# Patient Record
Sex: Female | Born: 1945 | Race: White | Hispanic: No | Smoking: Former smoker
Health system: Southern US, Community
[De-identification: ages and names within clinical notes are randomized; demographics above are authoritative.]

## PROBLEM LIST (undated history)

## (undated) DIAGNOSIS — I493 Ventricular premature depolarization: Secondary | ICD-10-CM

## (undated) DIAGNOSIS — E039 Hypothyroidism, unspecified: Secondary | ICD-10-CM

## (undated) DIAGNOSIS — E7800 Pure hypercholesterolemia, unspecified: Secondary | ICD-10-CM

## (undated) DIAGNOSIS — J449 Chronic obstructive pulmonary disease, unspecified: Secondary | ICD-10-CM

## (undated) DIAGNOSIS — G4730 Sleep apnea, unspecified: Secondary | ICD-10-CM

## (undated) DIAGNOSIS — I2510 Atherosclerotic heart disease of native coronary artery without angina pectoris: Secondary | ICD-10-CM

## (undated) DIAGNOSIS — C4490 Unspecified malignant neoplasm of skin, unspecified: Secondary | ICD-10-CM

## (undated) DIAGNOSIS — T4145XA Adverse effect of unspecified anesthetic, initial encounter: Secondary | ICD-10-CM

## (undated) DIAGNOSIS — C50919 Malignant neoplasm of unspecified site of unspecified female breast: Secondary | ICD-10-CM

## (undated) DIAGNOSIS — G500 Trigeminal neuralgia: Secondary | ICD-10-CM

## (undated) DIAGNOSIS — K2270 Barrett's esophagus without dysplasia: Secondary | ICD-10-CM

## (undated) DIAGNOSIS — F329 Major depressive disorder, single episode, unspecified: Secondary | ICD-10-CM

## (undated) DIAGNOSIS — I10 Essential (primary) hypertension: Secondary | ICD-10-CM

## (undated) DIAGNOSIS — J45909 Unspecified asthma, uncomplicated: Secondary | ICD-10-CM

## (undated) DIAGNOSIS — D509 Iron deficiency anemia, unspecified: Secondary | ICD-10-CM

## (undated) DIAGNOSIS — K219 Gastro-esophageal reflux disease without esophagitis: Secondary | ICD-10-CM

## (undated) DIAGNOSIS — K635 Polyp of colon: Secondary | ICD-10-CM

## (undated) DIAGNOSIS — T8859XA Other complications of anesthesia, initial encounter: Secondary | ICD-10-CM

## (undated) DIAGNOSIS — J189 Pneumonia, unspecified organism: Secondary | ICD-10-CM

## (undated) DIAGNOSIS — F32A Depression, unspecified: Secondary | ICD-10-CM

## (undated) DIAGNOSIS — Z87891 Personal history of nicotine dependence: Secondary | ICD-10-CM

## (undated) DIAGNOSIS — F419 Anxiety disorder, unspecified: Secondary | ICD-10-CM

## (undated) HISTORY — DX: Pure hypercholesterolemia, unspecified: E78.00

## (undated) HISTORY — DX: Malignant neoplasm of unspecified site of unspecified female breast: C50.919

## (undated) HISTORY — PX: MASTECTOMY: SHX3

## (undated) HISTORY — DX: Major depressive disorder, single episode, unspecified: F32.9

## (undated) HISTORY — PX: COLONOSCOPY W/ POLYPECTOMY: SHX1380

## (undated) HISTORY — DX: Depression, unspecified: F32.A

## (undated) HISTORY — DX: Hypothyroidism, unspecified: E03.9

## (undated) HISTORY — PX: OTHER SURGICAL HISTORY: SHX169

## (undated) HISTORY — DX: Essential (primary) hypertension: I10

## (undated) HISTORY — DX: Personal history of nicotine dependence: Z87.891

## (undated) HISTORY — DX: Atherosclerotic heart disease of native coronary artery without angina pectoris: I25.10

## (undated) HISTORY — DX: Sleep apnea, unspecified: G47.30

---

## 1998-03-24 ENCOUNTER — Other Ambulatory Visit: Admission: RE | Admit: 1998-03-24 | Discharge: 1998-03-24 | Payer: Self-pay | Admitting: Obstetrics and Gynecology

## 1998-04-23 ENCOUNTER — Other Ambulatory Visit: Admission: RE | Admit: 1998-04-23 | Discharge: 1998-04-23 | Payer: Self-pay | Admitting: Obstetrics and Gynecology

## 1998-05-28 ENCOUNTER — Other Ambulatory Visit: Admission: RE | Admit: 1998-05-28 | Discharge: 1998-05-28 | Payer: Self-pay | Admitting: Obstetrics and Gynecology

## 1999-10-13 ENCOUNTER — Other Ambulatory Visit: Admission: RE | Admit: 1999-10-13 | Discharge: 1999-10-13 | Payer: Self-pay | Admitting: Family Medicine

## 2001-02-20 ENCOUNTER — Other Ambulatory Visit: Admission: RE | Admit: 2001-02-20 | Discharge: 2001-02-20 | Payer: Self-pay | Admitting: Obstetrics and Gynecology

## 2004-09-05 ENCOUNTER — Emergency Department: Payer: Self-pay | Admitting: Emergency Medicine

## 2004-09-20 ENCOUNTER — Ambulatory Visit: Payer: Self-pay | Admitting: Internal Medicine

## 2004-09-23 ENCOUNTER — Encounter: Payer: Self-pay | Admitting: Internal Medicine

## 2004-09-30 ENCOUNTER — Ambulatory Visit: Payer: Self-pay | Admitting: Internal Medicine

## 2005-05-27 ENCOUNTER — Ambulatory Visit: Payer: Self-pay | Admitting: Internal Medicine

## 2005-06-03 ENCOUNTER — Ambulatory Visit: Payer: Self-pay | Admitting: Internal Medicine

## 2005-10-19 ENCOUNTER — Ambulatory Visit: Payer: Self-pay | Admitting: Unknown Physician Specialty

## 2006-07-18 LAB — HM MAMMOGRAPHY

## 2006-08-08 ENCOUNTER — Ambulatory Visit: Payer: Self-pay | Admitting: Internal Medicine

## 2006-08-21 ENCOUNTER — Ambulatory Visit: Payer: Self-pay | Admitting: Specialist

## 2006-08-22 ENCOUNTER — Ambulatory Visit: Payer: Self-pay | Admitting: Family Medicine

## 2006-10-03 ENCOUNTER — Ambulatory Visit: Payer: Self-pay | Admitting: Specialist

## 2007-01-01 ENCOUNTER — Ambulatory Visit: Payer: Self-pay | Admitting: Specialist

## 2007-02-20 ENCOUNTER — Ambulatory Visit: Payer: Self-pay | Admitting: Family Medicine

## 2007-05-10 ENCOUNTER — Ambulatory Visit: Payer: Self-pay | Admitting: Internal Medicine

## 2007-05-29 ENCOUNTER — Ambulatory Visit: Payer: Self-pay | Admitting: Internal Medicine

## 2007-06-09 ENCOUNTER — Ambulatory Visit: Payer: Self-pay | Admitting: Internal Medicine

## 2007-10-18 ENCOUNTER — Ambulatory Visit: Payer: Self-pay | Admitting: Internal Medicine

## 2008-02-27 ENCOUNTER — Encounter: Payer: Self-pay | Admitting: Internal Medicine

## 2008-03-11 ENCOUNTER — Encounter: Payer: Self-pay | Admitting: Internal Medicine

## 2008-04-10 ENCOUNTER — Ambulatory Visit: Payer: Self-pay | Admitting: Internal Medicine

## 2008-04-10 ENCOUNTER — Encounter: Payer: Self-pay | Admitting: Internal Medicine

## 2008-10-06 ENCOUNTER — Ambulatory Visit: Payer: Self-pay | Admitting: Internal Medicine

## 2008-11-27 ENCOUNTER — Ambulatory Visit: Payer: Self-pay | Admitting: Internal Medicine

## 2008-12-10 ENCOUNTER — Ambulatory Visit: Payer: Self-pay | Admitting: Internal Medicine

## 2008-12-31 ENCOUNTER — Ambulatory Visit: Payer: Self-pay | Admitting: Surgery

## 2008-12-31 ENCOUNTER — Ambulatory Visit: Payer: Self-pay | Admitting: Internal Medicine

## 2009-01-06 ENCOUNTER — Ambulatory Visit: Payer: Self-pay | Admitting: Surgery

## 2009-01-08 ENCOUNTER — Ambulatory Visit: Payer: Self-pay | Admitting: Oncology

## 2009-01-08 DIAGNOSIS — C50919 Malignant neoplasm of unspecified site of unspecified female breast: Secondary | ICD-10-CM

## 2009-01-08 HISTORY — DX: Malignant neoplasm of unspecified site of unspecified female breast: C50.919

## 2009-01-16 ENCOUNTER — Ambulatory Visit: Payer: Self-pay | Admitting: Oncology

## 2009-02-06 ENCOUNTER — Other Ambulatory Visit: Payer: Self-pay | Admitting: Internal Medicine

## 2009-02-08 ENCOUNTER — Ambulatory Visit: Payer: Self-pay | Admitting: Oncology

## 2009-02-09 ENCOUNTER — Inpatient Hospital Stay: Payer: Self-pay | Admitting: Surgery

## 2009-02-25 ENCOUNTER — Ambulatory Visit: Payer: Self-pay | Admitting: Oncology

## 2009-03-11 ENCOUNTER — Ambulatory Visit: Payer: Self-pay | Admitting: Oncology

## 2009-04-10 ENCOUNTER — Ambulatory Visit: Payer: Self-pay | Admitting: Oncology

## 2009-04-24 ENCOUNTER — Encounter: Payer: Self-pay | Admitting: Internal Medicine

## 2009-05-11 ENCOUNTER — Encounter: Payer: Self-pay | Admitting: Internal Medicine

## 2009-05-11 HISTORY — PX: MASTECTOMY: SHX3

## 2009-06-11 ENCOUNTER — Ambulatory Visit: Payer: Self-pay | Admitting: Surgery

## 2009-06-18 ENCOUNTER — Inpatient Hospital Stay: Payer: Self-pay | Admitting: Surgery

## 2009-07-11 ENCOUNTER — Ambulatory Visit: Payer: Self-pay | Admitting: Oncology

## 2009-07-11 HISTORY — PX: BREAST SURGERY: SHX581

## 2009-07-31 ENCOUNTER — Ambulatory Visit: Payer: Self-pay | Admitting: Oncology

## 2009-08-11 ENCOUNTER — Ambulatory Visit: Payer: Self-pay | Admitting: Oncology

## 2009-09-24 ENCOUNTER — Ambulatory Visit: Payer: Self-pay

## 2009-11-08 ENCOUNTER — Ambulatory Visit: Payer: Self-pay | Admitting: Oncology

## 2009-11-12 ENCOUNTER — Ambulatory Visit: Payer: Self-pay | Admitting: Oncology

## 2009-12-09 ENCOUNTER — Ambulatory Visit: Payer: Self-pay | Admitting: Oncology

## 2010-02-08 ENCOUNTER — Ambulatory Visit: Payer: Self-pay | Admitting: Oncology

## 2010-02-25 ENCOUNTER — Ambulatory Visit: Payer: Self-pay | Admitting: Oncology

## 2010-03-11 ENCOUNTER — Ambulatory Visit: Payer: Self-pay | Admitting: Oncology

## 2010-05-18 ENCOUNTER — Ambulatory Visit: Payer: Self-pay | Admitting: Oncology

## 2010-05-21 ENCOUNTER — Ambulatory Visit: Payer: Self-pay | Admitting: Oncology

## 2010-05-22 LAB — CANCER ANTIGEN 27.29: CA 27.29: 18.1 U/mL (ref 0.0–38.6)

## 2010-06-10 ENCOUNTER — Ambulatory Visit: Payer: Self-pay | Admitting: Oncology

## 2010-11-19 ENCOUNTER — Ambulatory Visit: Payer: Self-pay | Admitting: Oncology

## 2010-11-20 LAB — CANCER ANTIGEN 27.29: CA 27.29: 13.8 U/mL (ref 0.0–38.6)

## 2010-12-10 ENCOUNTER — Ambulatory Visit: Payer: Self-pay | Admitting: Oncology

## 2010-12-31 ENCOUNTER — Ambulatory Visit: Payer: Self-pay | Admitting: Internal Medicine

## 2011-02-15 ENCOUNTER — Encounter: Payer: Self-pay | Admitting: Internal Medicine

## 2011-03-02 ENCOUNTER — Other Ambulatory Visit: Payer: Self-pay | Admitting: Internal Medicine

## 2011-03-02 MED ORDER — VENLAFAXINE HCL ER 150 MG PO CP24: 150.0000 mg | ORAL_CAPSULE | Freq: Every day | ORAL | Status: DC

## 2011-07-14 ENCOUNTER — Telehealth: Payer: Self-pay | Admitting: Internal Medicine

## 2011-07-14 NOTE — Telephone Encounter (Signed)
Refill on her Lisinopril 40 mg . Called into [facility].

## 2011-07-14 NOTE — Telephone Encounter (Signed)
Ok to do,  Patient's mother is in hospital and I spoike with her

## 2011-07-15 ENCOUNTER — Ambulatory Visit: Payer: Self-pay | Admitting: Oncology

## 2011-07-15 LAB — CBC CANCER CENTER
Basophil #: 0.1 "x10 3/mm "
Basophil %: 0.8 %
Eosinophil #: 0.2 "x10 3/mm "
Eosinophil %: 2.7 %
HCT: 37.5 %
HGB: 13.1 g/dL
Lymphocyte %: 43.4 %
Lymphs Abs: 2.8 "x10 3/mm "
MCH: 31.6 pg
MCHC: 35.1 g/dL
MCV: 90 fL
Monocyte #: 0.5 "x10 3/mm "
Monocyte %: 7.6 %
Neutrophil #: 3 "x10 3/mm "
Neutrophil %: 45.5 %
Platelet: 232 "x10 3/mm "
RBC: 4.16 "x10 6/mm "
RDW: 13.2 %
WBC: 6.6 "x10 3/mm "

## 2011-07-15 LAB — COMPREHENSIVE METABOLIC PANEL
Albumin: 3.7 g/dL (ref 3.4–5.0)
BUN: 19 mg/dL — ABNORMAL HIGH (ref 7–18)
Bilirubin,Total: 0.2 mg/dL (ref 0.2–1.0)
Chloride: 104 mmol/L (ref 98–107)
Co2: 29 mmol/L (ref 21–32)
EGFR (African American): 56 — ABNORMAL LOW
EGFR (Non-African Amer.): 46 — ABNORMAL LOW
SGOT(AST): 23 U/L (ref 15–37)
SGPT (ALT): 44 U/L
Sodium: 140 mmol/L (ref 136–145)

## 2011-07-21 ENCOUNTER — Telehealth: Payer: Self-pay | Admitting: *Deleted

## 2011-07-21 NOTE — Telephone Encounter (Signed)
Pt's daughter states pt has high fever, cough, body aches, weakness- started suddenly last night.  She has been visiting her mother in the hospital and exposed to a lot of possible flu.  She is unable to come in because she feels so bad.  She is asking that something be called to [facility].

## 2011-07-22 MED ORDER — LEVOFLOXACIN 500 MG PO TABS: 500.0000 mg | ORAL_TABLET | Freq: Every day | ORAL | Status: AC

## 2011-07-22 MED ORDER — HYDROCODONE-ACETAMINOPHEN 5-500 MG PO TABS: ORAL_TABLET | ORAL | Status: DC

## 2011-07-22 NOTE — Telephone Encounter (Signed)
Please see note below, I thought I sent it to you yesterday, but perhaps I didn't.  I called pt's daughter and pt is still the same as yesterday.  She thinks she has some fever but is taking tylenol and sweating.  Still doesn't feel like coming in.  Please advise.

## 2011-07-22 NOTE — Telephone Encounter (Signed)
Per Dr. Darrick Huntsman, pt given the choice of tamiflu, which is expensive and may not help her, or antibiotic and cough medicine.  Daughter prefers that pt have the antibiotic and cough medicine.  meds called to [facility].

## 2011-07-26 ENCOUNTER — Encounter: Payer: Self-pay | Admitting: Internal Medicine

## 2011-07-26 ENCOUNTER — Ambulatory Visit (INDEPENDENT_AMBULATORY_CARE_PROVIDER_SITE_OTHER): Payer: Medicare Other | Admitting: Internal Medicine

## 2011-07-26 VITALS — BP 128/62 | HR 80 | Temp 98.0°F | Wt 188.0 lb

## 2011-07-26 DIAGNOSIS — R6889 Other general symptoms and signs: Secondary | ICD-10-CM

## 2011-07-26 DIAGNOSIS — F17200 Nicotine dependence, unspecified, uncomplicated: Secondary | ICD-10-CM

## 2011-07-26 DIAGNOSIS — R5381 Other malaise: Secondary | ICD-10-CM

## 2011-07-26 DIAGNOSIS — F3289 Other specified depressive episodes: Secondary | ICD-10-CM

## 2011-07-26 DIAGNOSIS — F329 Major depressive disorder, single episode, unspecified: Secondary | ICD-10-CM

## 2011-07-26 DIAGNOSIS — Z7189 Other specified counseling: Secondary | ICD-10-CM

## 2011-07-26 DIAGNOSIS — F32A Depression, unspecified: Secondary | ICD-10-CM

## 2011-07-26 DIAGNOSIS — Z716 Tobacco abuse counseling: Secondary | ICD-10-CM

## 2011-07-26 DIAGNOSIS — C50919 Malignant neoplasm of unspecified site of unspecified female breast: Secondary | ICD-10-CM

## 2011-07-26 DIAGNOSIS — R5383 Other fatigue: Secondary | ICD-10-CM

## 2011-07-26 DIAGNOSIS — Z720 Tobacco use: Secondary | ICD-10-CM

## 2011-07-26 DIAGNOSIS — J441 Chronic obstructive pulmonary disease with (acute) exacerbation: Secondary | ICD-10-CM

## 2011-07-26 MED ORDER — ESZOPICLONE 3 MG PO TABS: 3.0000 mg | ORAL_TABLET | Freq: Every day | ORAL | Status: DC

## 2011-07-26 MED ORDER — VENLAFAXINE HCL ER 150 MG PO CP24: 150.0000 mg | ORAL_CAPSULE | Freq: Every day | ORAL | Status: DC

## 2011-07-26 MED ORDER — ALBUTEROL SULFATE (2.5 MG/3ML) 0.083% IN NEBU
2.5000 mg | INHALATION_SOLUTION | Freq: Four times a day (QID) | RESPIRATORY_TRACT | Status: DC | PRN
  Administered 2011-07-26: 2.5 mg via RESPIRATORY_TRACT

## 2011-07-26 MED ORDER — LISINOPRIL 40 MG PO TABS: 40.0000 mg | ORAL_TABLET | Freq: Every day | ORAL | Status: DC

## 2011-07-26 MED ORDER — LEVOTHYROXINE SODIUM 75 MCG PO TABS: 75.0000 ug | ORAL_TABLET | Freq: Every day | ORAL | Status: DC

## 2011-07-26 MED ORDER — OMEPRAZOLE 40 MG PO CPDR: 40.0000 mg | DELAYED_RELEASE_CAPSULE | Freq: Every day | ORAL | Status: DC

## 2011-07-26 MED ORDER — METHYLPREDNISOLONE SODIUM SUCC 40 MG IJ SOLR
40.0000 mg | Freq: Once | INTRAMUSCULAR | Status: AC
  Administered 2011-07-26: 40 mg via INTRAMUSCULAR

## 2011-07-26 NOTE — Progress Notes (Signed)
Subjective:    Patient ID: Crystal Meyers, female    DOB: 07/02/1946, 66 y.o.   MRN: [id number]  HPI  66 yo white female with history of hypertension, On going tobacco abuse and breast ca, presents with URI symptoms and generalized weakness .  She has been taking antibiotics for 3 dayswithout significant change of symptoms  consistent with flu.  Was offered empiric Tamiflu but deferred secondary to cost. Reports vers,  Cough,  Malaise, myalgias and anorexia. [name]  No prior influenza vaccine. This is the sickest she has ever felt . Feels dizzy when she stands up, Trying to stay hydrated.  Sleep is disrupted by cough.  She has been taking care of elderly demented  Mother Scarlette Calico)  Who was  Discharged home with IV antibiotics x 3 for suspected brain abscess which may have started with a left parotid abscess. Has not been wearing a mask around her mother. One other family member became ill after helping out.     Past Medical History  Diagnosis Date  . Depression   . Hypertension   . Hypercholesteremia   . Breast cancer 7/10    left , invasive lobular carcinoma  . Sleep apnea   . Hypothyroidism    Current Outpatient Prescriptions on File Prior to Visit  Medication Sig Dispense Refill  . HYDROcodone-acetaminophen (VICODIN) 5-500 MG per tablet Take one by mouth every 6 hours as needed for severe cough.  30 tablet  0  . levofloxacin (LEVAQUIN) 500 MG tablet Take 1 tablet (500 mg total) by mouth daily.  7 tablet  0   No current facility-administered medications on file prior to visit.     Review of Systems  Constitutional: Positive for chills and fatigue. Negative for fever and unexpected weight change.  HENT: Positive for congestion and sinus pressure. Negative for hearing loss, ear pain, nosebleeds, sore throat, facial swelling, rhinorrhea, sneezing, mouth sores, trouble swallowing, neck pain, neck stiffness, voice change, postnasal drip, tinnitus and ear discharge.   Eyes: Negative for pain,  discharge, redness and visual disturbance.  Respiratory: Positive for cough. Negative for chest tightness, shortness of breath, wheezing and stridor.   Cardiovascular: Negative for chest pain, palpitations and leg swelling.  Gastrointestinal: Positive for nausea and diarrhea.  Musculoskeletal: Negative for myalgias and arthralgias.  Skin: Negative for color change and rash.  Neurological: Negative for dizziness, weakness, light-headedness and headaches.  Hematological: Negative for adenopathy.   BP 128/62  Pulse 80  Temp(Src) 98 F (36.7 C) (Oral)  Wt 188 lb (85.276 kg)  SpO2 98%     Objective:   Physical Exam  Constitutional: She is oriented to person, place, and time. She appears well-developed and well-nourished.       Sickly but not toxic appearing  HENT:  Mouth/Throat: Oropharynx is clear and moist.  Eyes: EOM are normal. Pupils are equal, round, and reactive to light. No scleral icterus.  Neck: Normal range of motion. Neck supple. No JVD present. No thyromegaly present.  Cardiovascular: Normal rate, regular rhythm, normal heart sounds and intact distal pulses.   Pulmonary/Chest: Effort normal. She has wheezes.  Abdominal: Soft. Bowel sounds are normal. She exhibits no mass. There is no tenderness.  Musculoskeletal: Normal range of motion. She exhibits no edema.  Lymphadenopathy:    She has no cervical adenopathy.  Neurological: She is alert and oriented to person, place, and time.  Skin: Skin is warm. She is diaphoretic.  Psychiatric: She has a normal mood and affect.  Assessment & Plan:

## 2011-07-27 ENCOUNTER — Encounter: Payer: Self-pay | Admitting: Internal Medicine

## 2011-07-27 ENCOUNTER — Telehealth: Payer: Self-pay | Admitting: *Deleted

## 2011-07-27 DIAGNOSIS — J439 Emphysema, unspecified: Secondary | ICD-10-CM | POA: Insufficient documentation

## 2011-07-27 DIAGNOSIS — Z720 Tobacco use: Secondary | ICD-10-CM

## 2011-07-27 DIAGNOSIS — C50919 Malignant neoplasm of unspecified site of unspecified female breast: Secondary | ICD-10-CM | POA: Insufficient documentation

## 2011-07-27 DIAGNOSIS — F329 Major depressive disorder, single episode, unspecified: Secondary | ICD-10-CM | POA: Insufficient documentation

## 2011-07-27 DIAGNOSIS — Z87891 Personal history of nicotine dependence: Secondary | ICD-10-CM | POA: Insufficient documentation

## 2011-07-27 DIAGNOSIS — Z716 Tobacco abuse counseling: Secondary | ICD-10-CM | POA: Insufficient documentation

## 2011-07-27 DIAGNOSIS — F32A Depression, unspecified: Secondary | ICD-10-CM | POA: Insufficient documentation

## 2011-07-27 HISTORY — DX: Tobacco use: Z72.0

## 2011-07-27 LAB — CBC WITH DIFFERENTIAL/PLATELET
Basophils Absolute: 0 10*3/uL (ref 0.0–0.1)
Basophils Relative: 0.3 % (ref 0.0–3.0)
Eosinophils Absolute: 0.1 10*3/uL (ref 0.0–0.7)
MCHC: 34.8 g/dL (ref 30.0–36.0)
MCV: 90.4 fl (ref 78.0–100.0)
Monocytes Absolute: 0.3 10*3/uL (ref 0.1–1.0)
Neutro Abs: 1.2 10*3/uL — ABNORMAL LOW (ref 1.4–7.7)
Neutrophils Relative %: 32.6 % — ABNORMAL LOW (ref 43.0–77.0)
RBC: 4.1 Mil/uL (ref 3.87–5.11)
RDW: 13 % (ref 11.5–14.6)

## 2011-07-27 LAB — COMPREHENSIVE METABOLIC PANEL
ALT: 51 U/L — ABNORMAL HIGH (ref 0–35)
AST: 50 U/L — ABNORMAL HIGH (ref 0–37)
Albumin: 3.9 g/dL (ref 3.5–5.2)
Alkaline Phosphatase: 55 U/L (ref 39–117)
BUN: 16 mg/dL (ref 6–23)
Chloride: 104 mEq/L (ref 96–112)
Potassium: 4.4 mEq/L (ref 3.5–5.1)
Sodium: 139 mEq/L (ref 135–145)

## 2011-07-27 MED ORDER — PREDNISONE (PAK) 10 MG PO TABS: ORAL_TABLET | ORAL | Status: AC

## 2011-07-27 MED ORDER — ALBUTEROL SULFATE HFA 108 (90 BASE) MCG/ACT IN AERS: 2.0000 | INHALATION_SPRAY | Freq: Four times a day (QID) | RESPIRATORY_TRACT | Status: DC | PRN

## 2011-07-27 NOTE — Telephone Encounter (Signed)
Pt called stating that albuterol inhaler was supposed to be called into pharm. I do not see on med list, please send into [facility]

## 2011-07-27 NOTE — Assessment & Plan Note (Signed)
Continue femara, regular follow up with Dr. Katrinka Blazing

## 2011-07-27 NOTE — Telephone Encounter (Signed)
Daughter states meds pt was prescribed yesterday were not sent to pharmacy.  All meds from yesterday called to [facility].

## 2011-07-27 NOTE — Assessment & Plan Note (Signed)
Continue effexor  

## 2011-07-27 NOTE — Assessment & Plan Note (Addendum)
Her rapid influenza test is negative and she is out of the window for effective treatment .  She was given a neb treatment  and 40 mg depomedrol in house, Continue antibiotics, steroids,  Cough suppressants and hydration  Smoking cessation strongly advised.

## 2011-07-27 NOTE — Telephone Encounter (Signed)
Done,

## 2011-07-27 NOTE — Telephone Encounter (Signed)
Pt's albuterol wasn't sent to [facility] during her visit yesterday, so she called after hours and Dr. Alwyn Ren ok'd call a nurse to send script to [facility] [address].

## 2011-08-12 ENCOUNTER — Ambulatory Visit: Payer: Self-pay | Admitting: Oncology

## 2011-08-28 LAB — HM COLONOSCOPY

## 2011-09-05 ENCOUNTER — Ambulatory Visit: Payer: Self-pay | Admitting: Unknown Physician Specialty

## 2011-09-06 LAB — PATHOLOGY REPORT

## 2011-09-09 ENCOUNTER — Ambulatory Visit: Payer: Self-pay | Admitting: Oncology

## 2011-09-20 ENCOUNTER — Encounter: Payer: Self-pay | Admitting: Internal Medicine

## 2011-10-10 ENCOUNTER — Ambulatory Visit (INDEPENDENT_AMBULATORY_CARE_PROVIDER_SITE_OTHER): Payer: Medicare Other | Admitting: Internal Medicine

## 2011-10-10 ENCOUNTER — Encounter: Payer: Self-pay | Admitting: Internal Medicine

## 2011-10-10 VITALS — BP 114/62 | HR 100 | Temp 98.2°F | Resp 16 | Wt 190.5 lb

## 2011-10-10 DIAGNOSIS — F32A Depression, unspecified: Secondary | ICD-10-CM

## 2011-10-10 DIAGNOSIS — G4733 Obstructive sleep apnea (adult) (pediatric): Secondary | ICD-10-CM

## 2011-10-10 DIAGNOSIS — F329 Major depressive disorder, single episode, unspecified: Secondary | ICD-10-CM

## 2011-10-10 DIAGNOSIS — C50919 Malignant neoplasm of unspecified site of unspecified female breast: Secondary | ICD-10-CM

## 2011-10-10 NOTE — Progress Notes (Signed)
Patient ID: Crystal Meyers, female   DOB: 27-Jan-1946, 66 y.o.   MRN: [id number]   Patient Active Problem List  Diagnoses  . COPD with acute exacerbation  . Tobacco abuse  . Breast cancer  . Tobacco abuse counseling  . Depression    Subjective:  CC:   Chief Complaint  Patient presents with  . Follow-up    HPI:   Crystal Meyers a 66 y.o. female who presents to discuss the condition of her mother, Crystal Meyers, who was hospitalized with severe hypernatremia and acute renal failure, RML pneumonia and cachexia.  She has been caring for her 24/7 for the last 4 years as her dementia has progressed and over the last 6 months has witnessed a progressive decline in function, including swallow function.  She has refused the dysphagia diet .   Past Medical History  Diagnosis Date  . Depression   . Hypertension   . Hypercholesteremia   . Breast cancer 7/10    left , invasive lobular carcinoma  . Sleep apnea   . Hypothyroidism     Past Surgical History  Procedure Date  . Mastectomy 05/2009    bilateral, reconstructive surgery 09/2009  . Mastectomy     Bilateral          The following portions of the patient's history were reviewed and updated as appropriate: Allergies, current medications, and problem list.    Review of Systems:   12 Pt  review of systems was negative except those addressed in the HPI,     History   Social History  . Marital Status: Widowed    Spouse Name: N/A    Number of Children: N/A  . Years of Education: N/A   Occupational History  . Not on file.   Social History Main Topics  . Smoking status: Current Everyday Smoker  . Smokeless tobacco: Never Used  . Alcohol Use: No  . Drug Use: No  . Sexually Active: Not on file   Other Topics Concern  . Not on file   Social History Narrative   Lives with mother.Recently widowed.Always uses seat beltsHas a bird and a dog.No exercise.    Objective:  BP 114/62  Pulse 100  Temp(Src) 98.2 F  (36.8 C) (Oral)  Resp 16  Wt 190 lb 8 oz (86.41 kg)  SpO2 96%  General appearance: alert, cooperative and appears stated age Ears: normal TM's and external ear canals both ears Throat: lips, mucosa, and tongue normal; teeth and gums normal Neck: no adenopathy, no carotid bruit, supple, symmetrical, trachea midline and thyroid not enlarged, symmetric, no tenderness/mass/nodules Back: symmetric, no curvature. ROM normal. No CVA tenderness. Lungs: clear to auscultation bilaterally Heart: regular rate and rhythm, S1, S2 normal, no murmur, click, rub or gallop Abdomen: soft, non-tender; bowel sounds normal; no masses,  no organomegaly Pulses: 2+ and symmetric Skin: Skin color, texture, turgor normal. No rashes or lesions Lymph nodes: Cervical, supraclavicular, and axillary nodes normal.  Assessment and Plan:  Depression We spent 30 minutes discussing her relationship with her mother and her utter selflessness with which she has cared for her, to the point of ignoring her own health.  She had initially requested a referral to GI for feeding tube placement for Crystal Meyers, but was really looking for reassurance that placement of a tube would not ultimately change the outcome of her mother's health.   After a long discussion she has decided to change goals of care to comfort measures and refer to hospice home.  Updated Medication List Outpatient Encounter Prescriptions as of 10/10/2011  Medication Sig Dispense Refill  . Calcium Carbonate-Vitamin D (CALTRATE 600+D PO) Take by mouth daily.      . Eszopiclone (ESZOPICLONE) 3 MG TABS Take 1 tablet (3 mg total) by mouth at bedtime. Take immediately before bedtime  90 tablet  3  . letrozole (FEMARA) 2.5 MG tablet Take 2.5 mg by mouth daily.      [name] levothyroxine (SYNTHROID) 75 MCG tablet Take 1 tablet (75 mcg total) by mouth daily.  90 tablet  3  . lisinopril (PRINIVIL,ZESTRIL) 40 MG tablet Take 1 tablet (40 mg total) by mouth daily.  90 tablet  3  .  omeprazole (PRILOSEC) 40 MG capsule Take 1 capsule (40 mg total) by mouth daily.  90 capsule  3  . Red Yeast Rice 600 MG TABS Take by mouth daily.      [name] venlafaxine (EFFEXOR-XR) 150 MG 24 hr capsule Take 1 capsule (150 mg total) by mouth daily.  90 capsule  3  . DISCONTD: albuterol (PROVENTIL HFA;VENTOLIN HFA) 108 (90 BASE) MCG/ACT inhaler Inhale 2 puffs into the lungs every 6 (six) hours as needed for wheezing.  1 Inhaler  6  . DISCONTD: HYDROcodone-acetaminophen (VICODIN) 5-500 MG per tablet Take one by mouth every 6 hours as needed for severe cough.  30 tablet  0     No orders of the defined types were placed in this encounter.    No Follow-up on file.

## 2011-10-10 NOTE — Assessment & Plan Note (Signed)
We spent 30 minutes discussing her relationship with her mother and her utter selflessness with which she has cared for her, to the point of ignoring her own health.  She had initially requested a referral to GI for feeding tube placement for Ruby, but was really looking for reassurance that placement of a tube would not ultimately change the outcome of her mother's health.   After a long discussion she has decided to change goals of care to comfort measures and refer to hospice home.

## 2011-10-14 DIAGNOSIS — G4733 Obstructive sleep apnea (adult) (pediatric): Secondary | ICD-10-CM | POA: Insufficient documentation

## 2011-10-14 DIAGNOSIS — Z9989 Dependence on other enabling machines and devices: Secondary | ICD-10-CM | POA: Insufficient documentation

## 2011-10-14 NOTE — Assessment & Plan Note (Signed)
Invasive lobular carcinoma, left breast. Diagnosed July 2010 . S/p bilateral mastectomy, Femara ([place])

## 2012-01-16 ENCOUNTER — Ambulatory Visit: Payer: Self-pay | Admitting: Oncology

## 2012-01-16 LAB — COMPREHENSIVE METABOLIC PANEL
Anion Gap: 8 (ref 7–16)
Bilirubin,Total: 0.3 mg/dL (ref 0.2–1.0)
Calcium, Total: 9.5 mg/dL (ref 8.5–10.1)
Chloride: 103 mmol/L (ref 98–107)
Co2: 27 mmol/L (ref 21–32)
Creatinine: 1.31 mg/dL — ABNORMAL HIGH (ref 0.60–1.30)
EGFR (African American): 50 — ABNORMAL LOW
EGFR (Non-African Amer.): 43 — ABNORMAL LOW
Glucose: 167 mg/dL — ABNORMAL HIGH (ref 65–99)
Osmolality: 280 (ref 275–301)
Potassium: 4.1 mmol/L (ref 3.5–5.1)
Sodium: 138 mmol/L (ref 136–145)

## 2012-01-16 LAB — CBC CANCER CENTER
Basophil #: 0 x10 3/mm (ref 0.0–0.1)
Basophil %: 0.8 %
Eosinophil #: 0.2 x10 3/mm (ref 0.0–0.7)
HCT: 39.3 % (ref 35.0–47.0)
HGB: 13.6 g/dL (ref 12.0–16.0)
Lymphocyte #: 2.2 x10 3/mm (ref 1.0–3.6)
Lymphocyte %: 36.7 %
MCHC: 34.5 g/dL (ref 32.0–36.0)
MCV: 90 fL (ref 80–100)
Monocyte #: 0.4 x10 3/mm (ref 0.2–0.9)
Monocyte %: 5.7 %
Neutrophil #: 3.3 x10 3/mm (ref 1.4–6.5)
RDW: 13.2 % (ref 11.5–14.5)
WBC: 6.1 x10 3/mm (ref 3.6–11.0)

## 2012-01-17 LAB — CANCER ANTIGEN 27.29: CA 27.29: 21.9 U/mL (ref 0.0–38.6)

## 2012-02-09 ENCOUNTER — Ambulatory Visit: Payer: Self-pay | Admitting: Oncology

## 2012-03-01 ENCOUNTER — Encounter: Payer: Self-pay | Admitting: Internal Medicine

## 2012-03-01 ENCOUNTER — Ambulatory Visit (INDEPENDENT_AMBULATORY_CARE_PROVIDER_SITE_OTHER): Payer: Medicare Other | Admitting: Internal Medicine

## 2012-03-01 VITALS — BP 130/72 | HR 86 | Temp 98.2°F | Resp 16 | Wt 189.5 lb

## 2012-03-01 DIAGNOSIS — T148XXA Other injury of unspecified body region, initial encounter: Secondary | ICD-10-CM

## 2012-03-01 DIAGNOSIS — T8189XA Other complications of procedures, not elsewhere classified, initial encounter: Secondary | ICD-10-CM

## 2012-03-01 MED ORDER — SULFAMETHOXAZOLE-TRIMETHOPRIM 800-160 MG PO TABS: 1.0000 | ORAL_TABLET | Freq: Two times a day (BID) | ORAL | Status: AC

## 2012-03-01 NOTE — Patient Instructions (Addendum)
Use sterile saline to clean the wound  Take the Septra Ds twice daily to kill any MRSA  Keep covered with non stick Telfa, ok to use petroleum jelly

## 2012-03-01 NOTE — Progress Notes (Signed)
Patient ID: Crystal Meyers, female   DOB: 03-20-1946, 66 y.o.   MRN: [id number]  Patient Active Problem List  Diagnosis  . COPD with acute exacerbation  . Tobacco abuse  . Breast cancer  . Tobacco abuse counseling  . Depression  . Sleep apnea, obstructive  . Surgical wound, non healing    Subjective:  CC:   Chief Complaint  Patient presents with  . Basal Cell Carcinoma    removal, not healing    HPI:   Crystal Cobleis a 66 y.o. female who presents for evaluation and treatment of left cheek open wound due to basal cell carcinoma which was exxcised one month ago by Dr. Cheree Ditto. Wound was sutured and sutures removed 8 days later. The following day the wound had opened and was draining pus. This was on a Saturday .  Was evaluated on Monday by Graham's PA , told it would close on its own. Steri strips applied and return visit in two weeks given.  No culture done. Er patient wound has been draining serosanguinous exudate.  Steri strips came off one week later, face had been swelling . Lots of exudate.  Repeat eval by dermatology  At two week followup,  Told to leave wound uncovered.  Has been cleaning it with peroxide.  Swelling improved,  No fevers, no change in wound.    Past Medical History  Diagnosis Date  . Depression   . Hypertension   . Hypercholesteremia   . Breast cancer 7/10    left , invasive lobular carcinoma  . Sleep apnea   . Hypothyroidism     Past Surgical History  Procedure Date  . Mastectomy 05/2009    bilateral, reconstructive surgery 09/2009  . Mastectomy     Bilateral          The following portions of the patient's history were reviewed and updated as appropriate: Allergies, current medications, and problem list.    Review of Systems:   12 Pt  review of systems was negative except those addressed in the HPI,     History   Social History  . Marital Status: Widowed    Spouse Name: N/A    Number of Children: N/A  . Years of Education: N/A    Occupational History  . Not on file.   Social History Main Topics  . Smoking status: Current Everyday Smoker  . Smokeless tobacco: Never Used  . Alcohol Use: No  . Drug Use: No  . Sexually Active: Not on file   Other Topics Concern  . Not on file   Social History Narrative   Lives with mother.Recently widowed.Always uses seat beltsHas a bird and a dog.No exercise.    Objective:  BP 130/72  Pulse 86  Temp 98.2 F (36.8 C) (Oral)  Resp 16  Wt 189 lb 8 oz (85.957 kg)  SpO2 97%  General appearance: alert, cooperative and appears stated age Lungs: clear to auscultation bilaterally Heart: regular rate and rhythm, S1, S2 normal, no murmur, click, rub or gallop Skin: left cheek: circular surgical wound 0.3 cm deep, 0.4 cm wide.  Edges are rolled,  Base non granulating,  No esudate.  No erythema or fluctuance Lymph nodes: Cervical, supraclavicular, and axillary nodes normal.  Assessment and Plan:  Surgical wound, non healing Secondary to dehiscence and inadequate care.  Wound culture done today was negative. Advised to stop using peroxide, cover wound with nonstick telfa and petroleum,  Lavage daily with sterile normal saline , and referred to  Wound Care for debridement of epiboly and nongranulating base.    Updated Medication List Outpatient Encounter Prescriptions as of 03/01/2012  Medication Sig Dispense Refill  . Calcium Carbonate-Vitamin D (CALTRATE 600+D PO) Take by mouth daily.      . Eszopiclone (ESZOPICLONE) 3 MG TABS Take 1 tablet (3 mg total) by mouth at bedtime. Take immediately before bedtime  90 tablet  3  . letrozole (FEMARA) 2.5 MG tablet Take 2.5 mg by mouth daily.      [name] levothyroxine (SYNTHROID) 75 MCG tablet Take 1 tablet (75 mcg total) by mouth daily.  90 tablet  3  . lisinopril (PRINIVIL,ZESTRIL) 40 MG tablet Take 1 tablet (40 mg total) by mouth daily.  90 tablet  3  . omeprazole (PRILOSEC) 40 MG capsule Take 1 capsule (40 mg total) by mouth daily.  90  capsule  3  . Red Yeast Rice 600 MG TABS Take by mouth daily.      [name] venlafaxine (EFFEXOR-XR) 150 MG 24 hr capsule Take 1 capsule (150 mg total) by mouth daily.  90 capsule  3  . sulfamethoxazole-trimethoprim (BACTRIM DS,SEPTRA DS) 800-160 MG per tablet Take 1 tablet by mouth 2 (two) times daily.  20 tablet  0     Orders Placed This Encounter  Procedures  . Wound culture  . Ambulatory referral to Wound Clinic    No Follow-up on file.

## 2012-03-03 DIAGNOSIS — T8189XA Other complications of procedures, not elsewhere classified, initial encounter: Secondary | ICD-10-CM | POA: Insufficient documentation

## 2012-03-03 NOTE — Assessment & Plan Note (Signed)
Secondary to dehiscence and inadequate care.  Wound culture done today was negative. Advised to stop using peroxide, cover wound with nonstick telfa and petroleum,  Lavage daily with sterile normal saline , and referred to Wound Care for debridement of epiboly and nongranulating base.

## 2012-03-04 LAB — WOUND CULTURE: Gram Stain: NONE SEEN

## 2012-03-07 ENCOUNTER — Encounter: Payer: Self-pay | Admitting: Cardiothoracic Surgery

## 2012-03-07 ENCOUNTER — Encounter: Payer: Self-pay | Admitting: Nurse Practitioner

## 2012-03-11 ENCOUNTER — Encounter: Payer: Self-pay | Admitting: Nurse Practitioner

## 2012-03-11 ENCOUNTER — Encounter: Payer: Self-pay | Admitting: Cardiothoracic Surgery

## 2012-04-10 ENCOUNTER — Encounter: Payer: Self-pay | Admitting: Nurse Practitioner

## 2012-04-10 ENCOUNTER — Encounter: Payer: Self-pay | Admitting: Cardiothoracic Surgery

## 2012-06-17 ENCOUNTER — Telehealth: Payer: Self-pay | Admitting: Internal Medicine

## 2012-06-17 DIAGNOSIS — G4733 Obstructive sleep apnea (adult) (pediatric): Secondary | ICD-10-CM

## 2012-06-17 NOTE — Telephone Encounter (Signed)
Crystal Meyers's request for new CPAP machine requires a new study, her last one was over 5 years ago. i will order one.

## 2012-06-18 NOTE — Telephone Encounter (Signed)
Patient notified via phone of new sleep study order.

## 2012-06-26 ENCOUNTER — Telehealth: Payer: Self-pay

## 2012-06-26 NOTE — Telephone Encounter (Signed)
James from [facility] left a Vm stating that he needs a signed prescription dated with the pressure indicated

## 2012-06-29 ENCOUNTER — Telehealth: Payer: Self-pay | Admitting: Internal Medicine

## 2012-06-29 ENCOUNTER — Other Ambulatory Visit: Payer: Self-pay | Admitting: Internal Medicine

## 2012-06-29 DIAGNOSIS — G4733 Obstructive sleep apnea (adult) (pediatric): Secondary | ICD-10-CM

## 2012-06-29 NOTE — Telephone Encounter (Signed)
Crystal Meyers,  Has this patient had her sleep study yet? It was ordered on Dec 8th.  Thanks.  I cannot reorder her CPAP without it

## 2012-07-06 ENCOUNTER — Ambulatory Visit: Payer: Self-pay | Admitting: Internal Medicine

## 2012-07-12 ENCOUNTER — Ambulatory Visit: Payer: Self-pay | Admitting: Internal Medicine

## 2012-08-01 ENCOUNTER — Encounter: Payer: Self-pay | Admitting: Internal Medicine

## 2012-08-09 ENCOUNTER — Other Ambulatory Visit: Payer: Self-pay | Admitting: Internal Medicine

## 2012-08-09 NOTE — Telephone Encounter (Signed)
Pt needing refills on Prilosac (Omeprazole) 40 mg (90 day supply), Leveothyroxine 0.075 mg (90day), and Lisinopril 40 mg (90 day). She uses [facility].

## 2012-08-09 NOTE — Telephone Encounter (Signed)
Med filled.  

## 2012-08-12 ENCOUNTER — Telehealth: Payer: Self-pay | Admitting: Internal Medicine

## 2012-08-12 NOTE — Telephone Encounter (Signed)
Her insurance is denying coverage of CPAP purchase until she  Demonstrates successful trial of a 3 month rental device. If she is not using the rental device, they will cover that,

## 2012-08-13 ENCOUNTER — Other Ambulatory Visit: Payer: Self-pay | Admitting: Internal Medicine

## 2012-08-13 NOTE — Telephone Encounter (Signed)
Pt daughter notified   

## 2012-08-13 NOTE — Telephone Encounter (Signed)
Called pt no answer °

## 2012-08-27 ENCOUNTER — Ambulatory Visit (INDEPENDENT_AMBULATORY_CARE_PROVIDER_SITE_OTHER): Payer: Medicare Other | Admitting: Internal Medicine

## 2012-08-27 ENCOUNTER — Other Ambulatory Visit (HOSPITAL_COMMUNITY)
Admission: RE | Admit: 2012-08-27 | Discharge: 2012-08-27 | Disposition: A | Discharge status: Home / Self Care | Payer: Medicare Other | Source: Ambulatory Visit | Attending: Internal Medicine | Admitting: Internal Medicine

## 2012-08-27 VITALS — BP 132/62 | HR 108 | Temp 98.0°F | Resp 16 | Ht 66.5 in | Wt 184.0 lb

## 2012-08-27 DIAGNOSIS — E669 Obesity, unspecified: Secondary | ICD-10-CM

## 2012-08-27 DIAGNOSIS — Z0000 Encounter for general adult medical examination without abnormal findings: Secondary | ICD-10-CM

## 2012-08-27 DIAGNOSIS — N390 Urinary tract infection, site not specified: Secondary | ICD-10-CM

## 2012-08-27 DIAGNOSIS — E785 Hyperlipidemia, unspecified: Secondary | ICD-10-CM

## 2012-08-27 DIAGNOSIS — Z6825 Body mass index (BMI) 25.0-25.9, adult: Secondary | ICD-10-CM

## 2012-08-27 DIAGNOSIS — Z7189 Other specified counseling: Secondary | ICD-10-CM

## 2012-08-27 DIAGNOSIS — J439 Emphysema, unspecified: Secondary | ICD-10-CM

## 2012-08-27 DIAGNOSIS — Z716 Tobacco abuse counseling: Secondary | ICD-10-CM

## 2012-08-27 DIAGNOSIS — Z01419 Encounter for gynecological examination (general) (routine) without abnormal findings: Secondary | ICD-10-CM | POA: Insufficient documentation

## 2012-08-27 DIAGNOSIS — J438 Other emphysema: Secondary | ICD-10-CM

## 2012-08-27 DIAGNOSIS — C50919 Malignant neoplasm of unspecified site of unspecified female breast: Secondary | ICD-10-CM

## 2012-08-27 DIAGNOSIS — F17200 Nicotine dependence, unspecified, uncomplicated: Secondary | ICD-10-CM

## 2012-08-27 DIAGNOSIS — T8189XA Other complications of procedures, not elsewhere classified, initial encounter: Secondary | ICD-10-CM

## 2012-08-27 DIAGNOSIS — Z1151 Encounter for screening for human papillomavirus (HPV): Secondary | ICD-10-CM | POA: Insufficient documentation

## 2012-08-27 DIAGNOSIS — E663 Overweight: Secondary | ICD-10-CM

## 2012-08-27 DIAGNOSIS — R300 Dysuria: Secondary | ICD-10-CM

## 2012-08-27 DIAGNOSIS — G4733 Obstructive sleep apnea (adult) (pediatric): Secondary | ICD-10-CM

## 2012-08-27 DIAGNOSIS — K2270 Barrett's esophagus without dysplasia: Secondary | ICD-10-CM

## 2012-08-27 LAB — COMPREHENSIVE METABOLIC PANEL
Albumin: 4.3 g/dL (ref 3.5–5.2)
Alkaline Phosphatase: 59 U/L (ref 39–117)
BUN: 18 mg/dL (ref 6–23)
CO2: 26 mEq/L (ref 19–32)
GFR: 43.41 mL/min — ABNORMAL LOW (ref >60.00)
Glucose, Bld: 95 mg/dL (ref 70–99)
Sodium: 136 mEq/L (ref 135–145)
Total Bilirubin: 0.5 mg/dL (ref 0.3–1.2)
Total Protein: 7.6 g/dL (ref 6.0–8.3)

## 2012-08-27 LAB — LIPID PANEL
Cholesterol: 212 mg/dL — ABNORMAL HIGH (ref 0–200)
HDL: 41.6 mg/dL (ref >39.00)
Triglycerides: 180 mg/dL — ABNORMAL HIGH (ref 0.0–149.0)

## 2012-08-27 LAB — POCT URINALYSIS DIPSTICK
Glucose, UA: NEGATIVE
Ketones, UA: NEGATIVE
Spec Grav, UA: 1.025

## 2012-08-27 LAB — MAGNESIUM: Magnesium: 1.7 mg/dL (ref 1.5–2.5)

## 2012-08-27 LAB — TSH: TSH: 1.76 u[IU]/mL (ref 0.35–5.50)

## 2012-08-27 LAB — HM PAP SMEAR: HM Pap smear: NORMAL

## 2012-08-27 LAB — HEMOGLOBIN A1C: Hgb A1c MFr Bld: 6 % (ref 4.6–6.5)

## 2012-08-27 MED ORDER — CIPROFLOXACIN HCL 250 MG PO TABS: 250.0000 mg | ORAL_TABLET | Freq: Two times a day (BID) | ORAL | Status: DC

## 2012-08-27 MED ORDER — PROMETHAZINE HCL 12.5 MG PO TABS: 12.5000 mg | ORAL_TABLET | Freq: Four times a day (QID) | ORAL | Status: DC | PRN

## 2012-08-27 MED ORDER — PHENAZOPYRIDINE HCL 200 MG PO TABS: 200.0000 mg | ORAL_TABLET | Freq: Three times a day (TID) | ORAL | Status: DC | PRN

## 2012-08-27 MED ORDER — HYDROCODONE-ACETAMINOPHEN 5-325 MG PO TABS: 1.0000 | ORAL_TABLET | Freq: Four times a day (QID) | ORAL | Status: DC | PRN

## 2012-08-27 NOTE — Progress Notes (Signed)
Patient ID: Crystal Meyers, female   DOB: 1945/07/21, 67 y.o.   MRN: [id number]     Subjective:    Crystal Meyers is a 67 y.o. female who presents for an annual exam. The patient has no complaints today. The patient is not sexually active. GYN screening history: last pap: was normal and patient does not recall results of last pap. The patient wears seatbelts: yes. The patient participates in regular exercise: no. Has the patient ever been transfused or tattooed?: no. The patient reports that there is not domestic violence in her life.  She has had 7 days of urinary burning  Despite taking 6 days of Septra DS which she started empirically.  Denies nausea, CVA pain and hematurkia.  2) She has resume smoking and is experiencing  Dyspnea with exertion,  bilateral thoracic back pain    Menstrual History: OB History   Grav Para Term Preterm Abortions TAB SAB Ect Mult Living                  Menarche age: 83 No LMP recorded. Patient is postmenopausal.    The following portions of the patient's history were reviewed and updated as appropriate: allergies, current medications, past family history, past medical history, past social history, past surgical history and problem list.  Review of Systems Patient denies headache, fevers, malaise, unintentional weight loss, skin rash, eye pain, sinus congestion and sinus pain, sore throat, dysphagia,  hemoptysis , cough, dyspnea, wheezing, chest pain, palpitations, orthopnea, edema, abdominal pain, nausea, melena, diarrhea, constipation, flank pain, dysuria, hematuria, urinary  Frequency, nocturia, numbness, tingling, seizures,  Focal weakness, Loss of consciousness,  Tremor, insomnia, depression, anxiety, and suicidal ideation.      Objective:    BP 132/62  Pulse 108  Temp(Src) 98 F (36.7 C) (Oral)  Resp 16  Ht 5' 6.5" (1.689 m)  Wt 184 lb (83.462 kg)  BMI 29.26 kg/m2  SpO2 97%  General Appearance:    Alert, cooperative, no distress, appears stated age   Head:    Normocephalic, without obvious abnormality, atraumatic  Eyes:    PERRL, conjunctiva/corneas clear, EOM's intact, fundi    benign, both eyes  Ears:    Normal TM's and external ear canals, both ears  Nose:   Nares normal, septum midline, mucosa normal, no drainage    or sinus tenderness  Throat:   Lips, mucosa, and tongue normal; teeth and gums normal  Neck:   Supple, symmetrical, trachea midline, no adenopathy;    thyroid:  no enlargement/tenderness/nodules; no carotid   bruit or JVD  Back:     Symmetric, no curvature, ROM normal, no CVA tenderness  Lungs:     Clear to auscultation bilaterally, respirations unlabored  Chest Wall:    No tenderness or deformity   Heart:    Regular rate and rhythm, S1 and S2 normal, no murmur, rub   or gallop  Breast Exam:    Bilateral mastectomies with reconstruction, no tenderness,  Or masses  Abdomen:     Soft, non-tender, bowel sounds active all four quadrants,    no masses, no organomegaly  Genitalia:    Normal female without lesion, discharge or tenderness     Extremities:   Extremities normal, atraumatic, no cyanosis or edema  Pulses:   2+ and symmetric all extremities  Skin:   Skin color, texture, turgor normal, no rashes or lesions  Lymph nodes:   Cervical, supraclavicular, and axillary nodes normal  Neurologic:   CNII-XII intact, normal strength,  sensation and reflexes    throughout  .    Assessment:   Breast cancer bresat exam done   Barrett's esophagus She is due for repeat endoscopy  COPD (chronic obstructive pulmonary disease) with emphysema She is not wheezing or hypoxic on exam.  Her DOE is aggravated by physical deconditioning   Tobacco abuse counseling Spent 3 minutes discussing risk of continued tobacco abuse, including but not limited to CAD, PAD, hypertension, and CA.  She iis not interested in pharmacotherapy at this time.  Routine general medical examination at a health care facility Annual exam including breast  , pelvic and PAP smear were done today.   Sleep apnea, obstructive Severe, with severe desaturations by Dec 2013 sleep study.  CPAP titration study done and now managed with CPAP   Surgical wound, non healing Her surgical wound from a basal cell excision in August 2013 was healed by the Wound Center.   Overweight (BMI 25.0-29.9) I have addressed  BMI and recommended a low glycemic index diet utilizing smaller more frequent meals to increase metabolism.  I have also recommended that patient start exercising with a goal of 30 minutes of aerobic exercise a minimum of 5 days per week. Screening for lipid disorders, thyroid and diabetes to be done today.    Urinary tract infection, site not specified Persistent despite empiric Septra.  Empiric cipro/.promethazine pending urine culture     Updated Medication List Outpatient Encounter Prescriptions as of 08/27/2012  Medication Sig Dispense Refill  . b complex vitamins tablet Take 1 tablet by mouth daily.      . Calcium Carbonate-Vitamin D (CALTRATE 600+D PO) Take by mouth daily.      . Eszopiclone (ESZOPICLONE) 3 MG TABS Take 1 tablet (3 mg total) by mouth at bedtime. Take immediately before bedtime  90 tablet  3  . letrozole (FEMARA) 2.5 MG tablet Take 2.5 mg by mouth daily.      [name] levothyroxine (SYNTHROID, LEVOTHROID) 75 MCG tablet TAKE 1 TABLET BY MOUTH EVERY DAY  90 tablet  0  . lisinopril (PRINIVIL,ZESTRIL) 40 MG tablet TAKE 1 TABLET BY MOUTH EVERY DAY  90 tablet  0  . omeprazole (PRILOSEC) 40 MG capsule TAKE 1 CAPSULE BY MOUTH EVERY DAY  90 capsule  0  . Red Yeast Rice 600 MG TABS Take by mouth daily.      [name] venlafaxine XR (EFFEXOR-XR) 150 MG 24 hr capsule TAKE 1 CAPSULE BY MOUTH EVERY DAY  270 capsule  0  . ciprofloxacin (CIPRO) 250 MG tablet Take 1 tablet (250 mg total) by mouth 2 (two) times daily.  14 tablet  0  . HYDROcodone-acetaminophen (NORCO/VICODIN) 5-325 MG per tablet Take 1 tablet by mouth every 6 (six) hours as needed for  pain.  30 tablet  0  . phenazopyridine (PYRIDIUM) 200 MG tablet Take 1 tablet (200 mg total) by mouth 3 (three) times daily as needed for pain.  10 tablet  0  . promethazine (PHENERGAN) 12.5 MG tablet Take 1 tablet (12.5 mg total) by mouth every 6 (six) hours as needed for nausea.  30 tablet  0   No facility-administered encounter medications on file as of 08/27/2012.

## 2012-08-27 NOTE — Patient Instructions (Addendum)
Please start the ciprofloxacin tonight,  Take 2 tonight , then one tablet twice daily until gone  vicodin and pyridium for pain , promethazine for nausea if needed  If you develop vomiting go to the ER!!   Please quit smoking!!  Once you are feeling better,  Please start a walking program.  You need to lose 10%  Of your current body weight over the next 6 months   This is  my version of a  "Low GI"  Diet:  It is not ultra low carb, but will still lower your blood sugars and allow you to lose 5 to 10 lbs per month if you follow it carefully. All of the foods can be found at grocery stores and in bulk at [employer].  The Atkins protein bars and shakes are available in more varieties at [employer], [employer] and [employer].     7 AM Breakfast:  Low carbohydrate Protein  Shakes (I recommend the EAS AdvantEdge "Carb Control" shakes  Or the low carb shakes by Atkins.   Both are available everywhere:  In  cases at [employer]Js  Or in 4 packs at grocery stores and pharmacies  2.5 carbs  (Alternative is  a toasted Arnold's Sandwhich Thin w/ peanut butter, a "Bagel Thin" with cream cheese and salmon) or  a scrambled egg burrito made with a low carb tortilla .  Avoid cereal and bananas, oatmeal too unless you are cooking the old fashioned kind that takes 30-40 minutes to prepare.  the rest is overly processed, has minimal fiber, and is loaded with carbohydrates!   10 AM: Protein bar by Atkins (the snack size, under 200 cal).  There are many varieties , available widely again or in bulk in limited varieties at BJs)  Other so called "protein bars" tend to be loaded with carbohydrates.  Remember, in food advertising, the word "energy" is synonymous for " carbohydrate."  Lunch: sandwich of [place], (or any lunchmeat, grilled meat or canned tuna), fresh avocado, mayonnaise  and cheese on a lower carbohydrate pita bread, flatbread, or tortilla . Ok to use regular mayonnaise. The bread is the only source or carbohydrate that  can be decreased (Joseph's makes a pita bread and a flat bread that are 50 cal and 4 net carbs ; Toufayan makes a low carb flatbread that's 100 cal and 9 net carbs  and  Mission makes a low carb whole wheat tortilla  That is 210 cal and 6 net carbs)  3 PM:  Mid day :  Another protein bar,  Or a  cheese stick (100 cal, 0 carbs),  Or 1 ounce of  almonds, walnuts, pistachios, pecans, peanuts,  Macadamia nuts. Or a Dannon light n Fit greek yogurt, 80 cal 8 net carbs . Avoid "granola"; the dried cranberries and raisins are loaded with carbohydrates. Mixed nuts ok if no raisins or cranberries or dried fruit.      6 PM  Dinner:  "mean and green:"  Meat/chicken/fish or a high protein legume; , with a green salad, and a low GI  Veggie (broccoli, cauliflower, green beans, spinach, brussel sprouts. Lima beans) : Avoid "Low fat dressings, as well as Reyne Dumas and [address]! They are loaded with sugar! Instead use ranch, vinagrette,  Blue cheese, etc.  There is a low carb pasta by Dreamfield's available at [facility] that is acceptable and tastes great. Try Michel Angel's chicken piccata over low carb pasta. The chicken dish is 0 carbs, and can be  found in frozen section at BJs and [employer]. Also try [employer]" (pulled pork, no sauce,  0 carbs) and his pot roast.   both are in the refrigerated section at BJs   Dreamfield's makes a low carb pasta only 5 g/serving.  Available at all grocery stores,  And tastes like normal pasta  9 PM snack : Breyer's "low carb" fudgsicle or  ice cream bar (Carb Smart line), or  Weight Watcher's ice cream bar , or another "no sugar added" ice cream;a serving of fresh berries/cherries with whipped cream (Avoid bananas, pineapple, grapes  and watermelon on a regular basis because they are high in sugar)   Remember that snack Substitutions should be less than 10 carbs per serving and meals < 20 carbs. Remember to subtract fiber grams and sugar alcohols to get the  "net carbs."

## 2012-08-27 NOTE — Assessment & Plan Note (Signed)
bresat exam done

## 2012-08-28 ENCOUNTER — Encounter: Payer: Self-pay | Admitting: Internal Medicine

## 2012-08-28 ENCOUNTER — Ambulatory Visit: Payer: Medicare Other | Admitting: Internal Medicine

## 2012-08-28 DIAGNOSIS — N390 Urinary tract infection, site not specified: Secondary | ICD-10-CM | POA: Insufficient documentation

## 2012-08-28 DIAGNOSIS — E663 Overweight: Secondary | ICD-10-CM | POA: Insufficient documentation

## 2012-08-28 NOTE — Assessment & Plan Note (Signed)
She is due for repeat endoscopy

## 2012-08-28 NOTE — Assessment & Plan Note (Signed)
I have addressed  BMI and recommended a low glycemic index diet utilizing smaller more frequent meals to increase metabolism.  I have also recommended that patient start exercising with a goal of 30 minutes of aerobic exercise a minimum of 5 days per week. Screening for lipid disorders, thyroid and diabetes to be done today.   

## 2012-08-28 NOTE — Assessment & Plan Note (Signed)
Her surgical wound from a basal cell excision in August 2013 was [facility].

## 2012-08-28 NOTE — Assessment & Plan Note (Signed)
She is not wheezing or hypoxic on exam.  Her DOE is aggravated by physical deconditioning

## 2012-08-28 NOTE — Assessment & Plan Note (Signed)
Annual exam including breast , pelvic and PAP smear were  done today.  

## 2012-08-28 NOTE — Assessment & Plan Note (Addendum)
Severe, with severe desaturations by Dec 2013 sleep study.  CPAP titration study done and now managed with CPAP

## 2012-08-28 NOTE — Assessment & Plan Note (Signed)
Spent 3 minutes discussing risk of continued tobacco abuse, including but not limited to CAD, PAD, hypertension, and CA.  She iis not interested in pharmacotherapy at this time.

## 2012-08-28 NOTE — Assessment & Plan Note (Signed)
Persistent despite empiric Septra.  Empiric cipro/.promethazine pending urine culture

## 2012-08-29 ENCOUNTER — Telehealth: Payer: Self-pay | Admitting: Internal Medicine

## 2012-08-29 LAB — URINE CULTURE

## 2012-08-29 NOTE — Telephone Encounter (Signed)
Pt daughter notified   

## 2012-08-29 NOTE — Telephone Encounter (Signed)
i do not have final data on the urine culture. She should continue the cipro until sh hears from me.

## 2012-08-29 NOTE — Telephone Encounter (Signed)
Pt's daughter called and is saying her mother is not doing any better from antibiotics for her UTI/ Her mother is having to take 2 Vicodin at this point to help with the pain. They are not sure if they need ot try another antibiotic or need to come back in or what they should do.

## 2012-08-30 ENCOUNTER — Encounter: Payer: Self-pay | Admitting: Internal Medicine

## 2012-08-30 MED ORDER — CEFUROXIME AXETIL 250 MG PO TABS: 250.0000 mg | ORAL_TABLET | Freq: Two times a day (BID) | ORAL | Status: DC

## 2012-08-30 NOTE — Addendum Note (Signed)
Addended by: Sherlene Shams on: 08/30/2012 08:21 AM   Modules accepted: Orders, Medications

## 2012-09-08 ENCOUNTER — Ambulatory Visit: Payer: Self-pay | Admitting: Oncology

## 2012-09-13 ENCOUNTER — Ambulatory Visit: Payer: Self-pay | Admitting: Oncology

## 2012-09-19 LAB — COMPREHENSIVE METABOLIC PANEL
Albumin: 3.8 g/dL (ref 3.4–5.0)
Alkaline Phosphatase: 59 U/L (ref 50–136)
Anion Gap: 10 (ref 7–16)
BUN: 17 mg/dL (ref 7–18)
Bilirubin,Total: 0.3 mg/dL (ref 0.2–1.0)
Creatinine: 1.23 mg/dL (ref 0.60–1.30)
EGFR (Non-African Amer.): 46 — ABNORMAL LOW
Glucose: 109 mg/dL — ABNORMAL HIGH (ref 65–99)
Osmolality: 283 (ref 275–301)
Potassium: 4.2 mmol/L (ref 3.5–5.1)
SGOT(AST): 29 U/L (ref 15–37)
SGPT (ALT): 45 U/L (ref 12–78)
Sodium: 141 mmol/L (ref 136–145)

## 2012-09-19 LAB — CBC CANCER CENTER
Basophil #: 0 x10 3/mm (ref 0.0–0.1)
Basophil %: 0.8 %
Eosinophil #: 0.2 x10 3/mm (ref 0.0–0.7)
Eosinophil %: 2.6 %
Lymphocyte #: 2.3 x10 3/mm (ref 1.0–3.6)
MCH: 30.9 pg (ref 26.0–34.0)
MCHC: 34 g/dL (ref 32.0–36.0)
MCV: 91 fL (ref 80–100)
Monocyte #: 0.4 x10 3/mm (ref 0.2–0.9)
Neutrophil #: 2.9 x10 3/mm (ref 1.4–6.5)
Neutrophil %: 49.8 %
Platelet: 221 x10 3/mm (ref 150–440)
WBC: 5.8 x10 3/mm (ref 3.6–11.0)

## 2012-10-09 ENCOUNTER — Ambulatory Visit: Payer: Self-pay | Admitting: Oncology

## 2012-10-25 LAB — HM MAMMOGRAPHY

## 2012-11-09 ENCOUNTER — Telehealth: Payer: Self-pay | Admitting: Internal Medicine

## 2012-11-09 MED ORDER — VENLAFAXINE HCL ER 150 MG PO CP24: ORAL_CAPSULE | ORAL | Status: DC

## 2012-11-09 MED ORDER — LISINOPRIL 40 MG PO TABS: ORAL_TABLET | ORAL | Status: DC

## 2012-11-09 MED ORDER — LEVOTHYROXINE SODIUM 75 MCG PO TABS: ORAL_TABLET | ORAL | Status: DC

## 2012-11-09 MED ORDER — OMEPRAZOLE 40 MG PO CPDR: DELAYED_RELEASE_CAPSULE | ORAL | Status: DC

## 2012-11-09 NOTE — Telephone Encounter (Signed)
Called patient back to confirm which medications she exactly needed refilled. Prilosec,Lisinopril, and Levothyroxine i sent those with #90 no refills.  She also needs a refill for  Her Venlafaxine XR (Effexor-XR) 150mg 

## 2012-11-09 NOTE — Telephone Encounter (Signed)
Ok to refill Effexor,   Refill sent . P[lease remind patient of our policy to use her pharmacy for refills,  Not the office , bc we cannot handle the volume of phone messages created by these types of requests.

## 2012-11-09 NOTE — Telephone Encounter (Signed)
Pt called checking on her rx  She stated she called here yesterday to get refills on all of her meds.  Pt stated she is completely out of her meds Please advise pt when this is called.  She does not need lynesta  [facility] [place]

## 2012-11-12 ENCOUNTER — Other Ambulatory Visit: Payer: Self-pay | Admitting: Internal Medicine

## 2012-11-12 NOTE — Telephone Encounter (Signed)
Last refilled 11/09/12 #90. Spoke with pharmacist at [facility], requesting future refill. Rx sent to pharmacy by escript

## 2013-02-07 ENCOUNTER — Other Ambulatory Visit: Payer: Self-pay | Admitting: Internal Medicine

## 2013-02-19 ENCOUNTER — Encounter: Payer: Self-pay | Admitting: Adult Health

## 2013-02-19 ENCOUNTER — Ambulatory Visit (INDEPENDENT_AMBULATORY_CARE_PROVIDER_SITE_OTHER): Payer: Medicare Other | Admitting: Adult Health

## 2013-02-19 VITALS — BP 110/66 | HR 78 | Temp 98.4°F | Resp 12 | Wt 170.5 lb

## 2013-02-19 DIAGNOSIS — T148 Other injury of unspecified body region: Secondary | ICD-10-CM

## 2013-02-19 DIAGNOSIS — W57XXXA Bitten or stung by nonvenomous insect and other nonvenomous arthropods, initial encounter: Secondary | ICD-10-CM

## 2013-02-19 MED ORDER — DOXYCYCLINE HYCLATE 100 MG PO TABS: 100.0000 mg | ORAL_TABLET | Freq: Two times a day (BID) | ORAL | Status: DC

## 2013-02-19 MED ORDER — PREDNISONE 10 MG PO TABS: ORAL_TABLET | ORAL | Status: DC

## 2013-02-19 NOTE — Patient Instructions (Addendum)
  Start the prednisone taper as discussed. Start with 6 tablets then decrease by 1 tablet daily until done.  Also start the doxycycline (antibiotic). You will take 1 tablet 2 times a day for 10 days.  You can apply calamine lotion to the affected areas.  Please let us know if your symptoms are not improved within 4-5 days.

## 2013-02-19 NOTE — Assessment & Plan Note (Signed)
Multiple bites. Patient reports removing ticks. The area appears more like flea bites, however, Start prednisone taper for severe itching. Doxycycline prophylactic treatment of tick borne illness. Advised patient not to "pop" pustules and to avoid scratching to prevent secondary lesions and/or infection. RTC if symptoms do not improve within 4-5 days or sooner if needed.

## 2013-02-19 NOTE — Progress Notes (Signed)
  Subjective:    Patient ID: Crystal Meyers, female    DOB: [birth year], 67 y.o.   MRN: [id number]  HPI  Patient is a 67 y/o female who presents to clinic with c/o multiple tick bites (over 100). She reports that she was cleaning out a pile of debris and weeds from the back of her house last Thursday when these tick bites occurred. Her daughter is with her during visit this morning and reports that she has been removing these ticks. She denies fever or chills. She is c/o significant itching. The tick bites are on her feet, bilateral legs, groin, abdomen and buttocks. Pt reports that she has been "popping" some of the pustules.   Current Outpatient Prescriptions on File Prior to Visit  Medication Sig Dispense Refill  . b complex vitamins tablet Take 1 tablet by mouth daily.      . Calcium Carbonate-Vitamin D (CALTRATE 600+D PO) Take by mouth daily.      . Eszopiclone (ESZOPICLONE) 3 MG TABS Take 1 tablet (3 mg total) by mouth at bedtime. Take immediately before bedtime  90 tablet  3  . letrozole (FEMARA) 2.5 MG tablet Take 2.5 mg by mouth daily.      [name] levothyroxine (SYNTHROID, LEVOTHROID) 75 MCG tablet TAKE 1 TABLET BY MOUTH EVERY DAY  90 tablet  0  . omeprazole (PRILOSEC) 40 MG capsule TAKE ONE CAPSULE BY MOUTH EVERY DAY  90 capsule  1  . promethazine (PHENERGAN) 12.5 MG tablet Take 1 tablet (12.5 mg total) by mouth every 6 (six) hours as needed for nausea.  30 tablet  0  . Red Yeast Rice 600 MG TABS Take by mouth daily.      [name] venlafaxine XR (EFFEXOR-XR) 150 MG 24 hr capsule TAKE 1 CAPSULE BY MOUTH EVERY DAY  90 capsule  3   No current facility-administered medications on file prior to visit.     Review of Systems  Constitutional: Negative.   Respiratory: Negative.   Cardiovascular: Negative.   Skin:       Multiple tick bites on feet, legs, groin, abdomen and buttocks. Itching.   BP 110/66  Pulse 78  Temp(Src) 98.4 F (36.9 C) (Oral)  Resp 12  Wt 170 lb 8 oz (77.338 kg)  BMI 27.11  kg/m2  SpO2 97%    Objective:   Physical Exam  Constitutional: She is oriented to person, place, and time. She appears well-developed and well-nourished. No distress.  Cardiovascular: Normal rate and regular rhythm.   Pulmonary/Chest: Effort normal. No respiratory distress.  Neurological: She is alert and oriented to person, place, and time.  Skin:  Multiple raised (bumps) on skin of bilateral feet, legs, groin, abdomen and buttocks that are reddened. Some have formed small pustules. No s/s infection noted.  Psychiatric: She has a normal mood and affect. Her behavior is normal. Judgment and thought content normal.      Assessment & Plan:

## 2013-03-20 ENCOUNTER — Ambulatory Visit: Payer: Self-pay | Admitting: Oncology

## 2013-03-20 LAB — CBC CANCER CENTER
Basophil #: 0 x10 3/mm (ref 0.0–0.1)
Eosinophil %: 3.6 %
HCT: 42.2 % (ref 35.0–47.0)
HGB: 14.5 g/dL (ref 12.0–16.0)
Lymphocyte %: 42 %
MCH: 31.1 pg (ref 26.0–34.0)
MCHC: 34.2 g/dL (ref 32.0–36.0)
MCV: 91 fL (ref 80–100)
Monocyte #: 0.4 x10 3/mm (ref 0.2–0.9)
Neutrophil #: 2.9 x10 3/mm (ref 1.4–6.5)
Neutrophil %: 46.4 %
RBC: 4.64 10*6/uL (ref 3.80–5.20)
RDW: 12.8 % (ref 11.5–14.5)
WBC: 6.1 x10 3/mm (ref 3.6–11.0)

## 2013-03-20 LAB — COMPREHENSIVE METABOLIC PANEL
Albumin: 3.8 g/dL (ref 3.4–5.0)
Anion Gap: 7 (ref 7–16)
Bilirubin,Total: 0.2 mg/dL (ref 0.2–1.0)
Calcium, Total: 9.9 mg/dL (ref 8.5–10.1)
Chloride: 104 mmol/L (ref 98–107)
Co2: 30 mmol/L (ref 21–32)
EGFR (African American): >60
EGFR (Non-African Amer.): 55 — ABNORMAL LOW
Potassium: 4.5 mmol/L (ref 3.5–5.1)
SGOT(AST): 19 U/L (ref 15–37)
SGPT (ALT): 25 U/L (ref 12–78)
Sodium: 141 mmol/L (ref 136–145)
Total Protein: 7.3 g/dL (ref 6.4–8.2)

## 2013-04-10 ENCOUNTER — Ambulatory Visit: Payer: Self-pay | Admitting: Oncology

## 2013-05-16 ENCOUNTER — Other Ambulatory Visit: Payer: Self-pay

## 2013-05-22 ENCOUNTER — Telehealth: Payer: Self-pay | Admitting: Internal Medicine

## 2013-05-22 MED ORDER — LEVOTHYROXINE SODIUM 75 MCG PO TABS: ORAL_TABLET | ORAL | Status: DC

## 2013-05-22 NOTE — Telephone Encounter (Signed)
Called patient she stated the only medication she needs is her synthroid [facility] [place].

## 2013-05-22 NOTE — Telephone Encounter (Signed)
Patients daughter states she called last week requesting all of her moms medications sent [facility] [place]. Her mom has a new insurance and they are requesting new prescriptions. She is completely out now. Please let her know once this has been called in.

## 2013-08-09 ENCOUNTER — Telehealth: Payer: Self-pay | Admitting: Internal Medicine

## 2013-08-09 MED ORDER — OMEPRAZOLE 40 MG PO CPDR: DELAYED_RELEASE_CAPSULE | ORAL | Status: DC

## 2013-08-09 NOTE — Telephone Encounter (Signed)
Pt called to get a refill on her prilosec  [facility] in [place]   Pt only has enough for tomorrow left

## 2013-08-09 NOTE — Telephone Encounter (Signed)
Pt's daughter notified Rx refill sent to pharmacy.

## 2013-08-16 ENCOUNTER — Other Ambulatory Visit: Payer: Self-pay | Admitting: Internal Medicine

## 2013-09-25 ENCOUNTER — Ambulatory Visit: Payer: Self-pay | Admitting: Oncology

## 2013-09-25 DIAGNOSIS — F3289 Other specified depressive episodes: Secondary | ICD-10-CM | POA: Diagnosis not present

## 2013-09-25 DIAGNOSIS — Z79811 Long term (current) use of aromatase inhibitors: Secondary | ICD-10-CM | POA: Diagnosis not present

## 2013-09-25 DIAGNOSIS — G2581 Restless legs syndrome: Secondary | ICD-10-CM | POA: Diagnosis not present

## 2013-09-25 DIAGNOSIS — Z170 Estrogen receptor positive status [ER+]: Secondary | ICD-10-CM | POA: Diagnosis not present

## 2013-09-25 DIAGNOSIS — F17200 Nicotine dependence, unspecified, uncomplicated: Secondary | ICD-10-CM | POA: Diagnosis not present

## 2013-09-25 DIAGNOSIS — C50419 Malignant neoplasm of upper-outer quadrant of unspecified female breast: Secondary | ICD-10-CM | POA: Diagnosis not present

## 2013-09-25 DIAGNOSIS — Z9010 Acquired absence of unspecified breast and nipple: Secondary | ICD-10-CM | POA: Diagnosis not present

## 2013-09-25 DIAGNOSIS — K219 Gastro-esophageal reflux disease without esophagitis: Secondary | ICD-10-CM | POA: Diagnosis not present

## 2013-09-25 DIAGNOSIS — E039 Hypothyroidism, unspecified: Secondary | ICD-10-CM | POA: Diagnosis not present

## 2013-09-25 DIAGNOSIS — Z79899 Other long term (current) drug therapy: Secondary | ICD-10-CM | POA: Diagnosis not present

## 2013-09-25 DIAGNOSIS — G4730 Sleep apnea, unspecified: Secondary | ICD-10-CM | POA: Diagnosis not present

## 2013-09-25 DIAGNOSIS — F329 Major depressive disorder, single episode, unspecified: Secondary | ICD-10-CM | POA: Diagnosis not present

## 2013-09-25 DIAGNOSIS — I10 Essential (primary) hypertension: Secondary | ICD-10-CM | POA: Diagnosis not present

## 2013-09-26 DIAGNOSIS — C50419 Malignant neoplasm of upper-outer quadrant of unspecified female breast: Secondary | ICD-10-CM | POA: Diagnosis not present

## 2013-09-26 DIAGNOSIS — F17200 Nicotine dependence, unspecified, uncomplicated: Secondary | ICD-10-CM | POA: Diagnosis not present

## 2013-09-26 DIAGNOSIS — I10 Essential (primary) hypertension: Secondary | ICD-10-CM | POA: Diagnosis not present

## 2013-09-26 DIAGNOSIS — Z79811 Long term (current) use of aromatase inhibitors: Secondary | ICD-10-CM | POA: Diagnosis not present

## 2013-09-26 DIAGNOSIS — E039 Hypothyroidism, unspecified: Secondary | ICD-10-CM | POA: Diagnosis not present

## 2013-09-26 DIAGNOSIS — Z170 Estrogen receptor positive status [ER+]: Secondary | ICD-10-CM | POA: Diagnosis not present

## 2013-09-26 LAB — COMPREHENSIVE METABOLIC PANEL
Albumin: 4 g/dL (ref 3.4–5.0)
Alkaline Phosphatase: 40 U/L — ABNORMAL LOW
Anion Gap: 9 (ref 7–16)
BILIRUBIN TOTAL: 0.4 mg/dL (ref 0.2–1.0)
BUN: 27 mg/dL — AB (ref 7–18)
CO2: 31 mmol/L (ref 21–32)
CREATININE: 1.07 mg/dL (ref 0.60–1.30)
Calcium, Total: 10.1 mg/dL (ref 8.5–10.1)
Chloride: 98 mmol/L (ref 98–107)
EGFR (African American): >60
GFR CALC NON AF AMER: 54 — AB
Glucose: 108 mg/dL — ABNORMAL HIGH (ref 65–99)
Osmolality: 281 (ref 275–301)
Potassium: 4.2 mmol/L (ref 3.5–5.1)
SGOT(AST): 22 U/L (ref 15–37)
SGPT (ALT): 26 U/L (ref 12–78)
SODIUM: 138 mmol/L (ref 136–145)
Total Protein: 7.4 g/dL (ref 6.4–8.2)

## 2013-09-26 LAB — CBC CANCER CENTER
Basophil #: 0 x10 3/mm (ref 0.0–0.1)
Basophil %: 0.7 %
Eosinophil #: 0.2 x10 3/mm (ref 0.0–0.7)
Eosinophil %: 3 %
HCT: 42.3 % (ref 35.0–47.0)
HGB: 14.1 g/dL (ref 12.0–16.0)
Lymphocyte #: 2.5 x10 3/mm (ref 1.0–3.6)
Lymphocyte %: 42.5 %
MCH: 30.7 pg (ref 26.0–34.0)
MCHC: 33.4 g/dL (ref 32.0–36.0)
MCV: 92 fL (ref 80–100)
Monocyte #: 0.5 x10 3/mm (ref 0.2–0.9)
Monocyte %: 7.6 %
Neutrophil #: 2.7 x10 3/mm (ref 1.4–6.5)
Neutrophil %: 46.2 %
Platelet: 218 x10 3/mm (ref 150–440)
RBC: 4.61 10*6/uL (ref 3.80–5.20)
RDW: 12.6 % (ref 11.5–14.5)
WBC: 5.9 x10 3/mm (ref 3.6–11.0)

## 2013-09-28 LAB — CANCER ANTIGEN 27.29: CA 27.29: 15.3 U/mL (ref 0.0–38.6)

## 2013-10-03 ENCOUNTER — Other Ambulatory Visit: Payer: Self-pay | Admitting: Internal Medicine

## 2013-10-07 NOTE — Telephone Encounter (Signed)
The patient is completely out of her medication and she is needing them called to the pharmacy today.

## 2013-10-08 NOTE — Telephone Encounter (Signed)
Refill one 30 days only.  Has not been seen in one year so she needs a  30 minute visit.  

## 2013-10-08 NOTE — Telephone Encounter (Signed)
Please advise as to refill no labs since 2/14 and No OV since 8/14

## 2013-10-09 ENCOUNTER — Ambulatory Visit: Payer: Self-pay | Admitting: Oncology

## 2013-10-09 NOTE — Telephone Encounter (Signed)
Patient appointment scheduled 10/25/13 patient voiced understanding must keep appointment for further refills.

## 2013-10-10 ENCOUNTER — Telehealth: Payer: Self-pay | Admitting: Internal Medicine

## 2013-10-10 MED ORDER — VENLAFAXINE HCL ER 150 MG PO CP24: ORAL_CAPSULE | ORAL | Status: DC

## 2013-10-10 NOTE — Telephone Encounter (Signed)
Notified patient scripts are at pharmacy as requested.

## 2013-10-10 NOTE — Telephone Encounter (Signed)
Pt calling to check status of medications.  States she spoke with someone yesterday who told her she had to have an appointment to get refills, but that they were going to refill x30 days to get her through.  Pt states the pharmacy told her they have nto heard from [place].  States she needs all meds.  States she cannot go without the Effexor or will have withdrawal symptoms.

## 2013-10-25 ENCOUNTER — Encounter: Payer: Self-pay | Admitting: Emergency Medicine

## 2013-10-25 ENCOUNTER — Ambulatory Visit (INDEPENDENT_AMBULATORY_CARE_PROVIDER_SITE_OTHER): Payer: Medicare Other | Admitting: Internal Medicine

## 2013-10-25 ENCOUNTER — Encounter: Payer: Self-pay | Admitting: Internal Medicine

## 2013-10-25 VITALS — BP 118/74 | HR 80 | Temp 98.2°F | Resp 16 | Wt 170.2 lb

## 2013-10-25 DIAGNOSIS — R7309 Other abnormal glucose: Secondary | ICD-10-CM

## 2013-10-25 DIAGNOSIS — E039 Hypothyroidism, unspecified: Secondary | ICD-10-CM | POA: Diagnosis not present

## 2013-10-25 DIAGNOSIS — G518 Other disorders of facial nerve: Secondary | ICD-10-CM

## 2013-10-25 DIAGNOSIS — I10 Essential (primary) hypertension: Secondary | ICD-10-CM

## 2013-10-25 DIAGNOSIS — F17200 Nicotine dependence, unspecified, uncomplicated: Secondary | ICD-10-CM

## 2013-10-25 DIAGNOSIS — E663 Overweight: Secondary | ICD-10-CM

## 2013-10-25 DIAGNOSIS — G500 Trigeminal neuralgia: Secondary | ICD-10-CM

## 2013-10-25 DIAGNOSIS — G519 Disorder of facial nerve, unspecified: Secondary | ICD-10-CM | POA: Diagnosis not present

## 2013-10-25 DIAGNOSIS — E785 Hyperlipidemia, unspecified: Secondary | ICD-10-CM

## 2013-10-25 DIAGNOSIS — E559 Vitamin D deficiency, unspecified: Secondary | ICD-10-CM | POA: Diagnosis not present

## 2013-10-25 DIAGNOSIS — R739 Hyperglycemia, unspecified: Secondary | ICD-10-CM

## 2013-10-25 DIAGNOSIS — R5381 Other malaise: Secondary | ICD-10-CM | POA: Diagnosis not present

## 2013-10-25 DIAGNOSIS — R5383 Other fatigue: Secondary | ICD-10-CM

## 2013-10-25 DIAGNOSIS — Z720 Tobacco use: Secondary | ICD-10-CM

## 2013-10-25 LAB — SEDIMENTATION RATE: Sed Rate: 24 mm/hr — ABNORMAL HIGH (ref 0–22)

## 2013-10-25 LAB — CBC WITH DIFFERENTIAL/PLATELET
BASOS PCT: 0.7 % (ref 0.0–3.0)
Basophils Absolute: 0.1 10*3/uL (ref 0.0–0.1)
EOS ABS: 0.1 10*3/uL (ref 0.0–0.7)
EOS PCT: 2 % (ref 0.0–5.0)
HCT: 40.7 % (ref 36.0–46.0)
HEMOGLOBIN: 14 g/dL (ref 12.0–15.0)
LYMPHS PCT: 33.7 % (ref 12.0–46.0)
Lymphs Abs: 2.4 10*3/uL (ref 0.7–4.0)
MCHC: 34.4 g/dL (ref 30.0–36.0)
MCV: 92.6 fl (ref 78.0–100.0)
MONO ABS: 0.4 10*3/uL (ref 0.1–1.0)
Monocytes Relative: 5.3 % (ref 3.0–12.0)
NEUTROS ABS: 4.1 10*3/uL (ref 1.4–7.7)
NEUTROS PCT: 58.3 % (ref 43.0–77.0)
Platelets: 234 10*3/uL (ref 150.0–400.0)
RBC: 4.39 Mil/uL (ref 3.87–5.11)
RDW: 12.9 % (ref 11.5–14.6)
WBC: 7.1 10*3/uL (ref 4.5–10.5)

## 2013-10-25 LAB — LIPID PANEL
CHOL/HDL RATIO: 4
Cholesterol: 226 mg/dL — ABNORMAL HIGH (ref 0–200)
HDL: 58.7 mg/dL (ref >39.00)
LDL CALC: 144 mg/dL — AB (ref 0–99)
TRIGLYCERIDES: 117 mg/dL (ref 0.0–149.0)
VLDL: 23.4 mg/dL (ref 0.0–40.0)

## 2013-10-25 LAB — COMPREHENSIVE METABOLIC PANEL
ALT: 22 U/L (ref 0–35)
AST: 24 U/L (ref 0–37)
Albumin: 4.1 g/dL (ref 3.5–5.2)
Alkaline Phosphatase: 40 U/L (ref 39–117)
BILIRUBIN TOTAL: 0.7 mg/dL (ref 0.3–1.2)
BUN: 24 mg/dL — ABNORMAL HIGH (ref 6–23)
CHLORIDE: 101 meq/L (ref 96–112)
CO2: 29 mEq/L (ref 19–32)
CREATININE: 0.9 mg/dL (ref 0.4–1.2)
Calcium: 10.4 mg/dL (ref 8.4–10.5)
GFR: 64.23 mL/min (ref >60.00)
GLUCOSE: 107 mg/dL — AB (ref 70–99)
Potassium: 4.4 mEq/L (ref 3.5–5.1)
Sodium: 140 mEq/L (ref 135–145)
TOTAL PROTEIN: 6.6 g/dL (ref 6.0–8.3)

## 2013-10-25 LAB — C-REACTIVE PROTEIN: CRP: <0.5 mg/dL (ref 0.5–20.0)

## 2013-10-25 LAB — MICROALBUMIN / CREATININE URINE RATIO
CREATININE, U: 30.4 mg/dL
MICROALB/CREAT RATIO: 0.7 mg/g (ref 0.0–30.0)
Microalb, Ur: 0.2 mg/dL (ref 0.0–1.9)

## 2013-10-25 LAB — TSH: TSH: 2.68 u[IU]/mL (ref 0.35–5.50)

## 2013-10-25 LAB — HEMOGLOBIN A1C: Hgb A1c MFr Bld: 5.7 % (ref 4.6–6.5)

## 2013-10-25 MED ORDER — CARBAMAZEPINE 200 MG PO TABS: ORAL_TABLET | ORAL | Status: DC

## 2013-10-25 NOTE — Patient Instructions (Signed)
Trigeminal Neuralgia Trigeminal neuralgia is a nerve disorder that causes sudden attacks of severe facial pain. It is caused by damage to the trigeminal nerve, a major nerve in the face. It is more common in women and in the elderly, although it can also happen in younger patients. Attacks last from a few seconds to several minutes and can occur from a couple of times per year to several times per day. Trigeminal neuralgia can be a very distressing and disabling condition. Surgery may be needed in very severe cases if medical treatment does not give relief. HOME CARE INSTRUCTIONS   If your caregiver prescribed medication to help prevent attacks, take as directed.  To help prevent attacks:  Chew on the unaffected side of the mouth.  Avoid touching your face.  Avoid blasts of hot or cold air.  Men may wish to grow a beard to avoid having to shave. SEEK IMMEDIATE MEDICAL CARE IF:  Pain is unbearable and your medicine does not help.  You develop new, unexplained symptoms (problems).  You have problems that may be related to a medication you are taking. Document Released: 06/24/2000 Document Revised: 09/19/2011 Document Reviewed: 04/24/2009 ExitCare Patient Information 2014 [employer].  

## 2013-10-25 NOTE — Progress Notes (Signed)
Pre-visit discussion using our clinic review tool. No additional management support is needed unless otherwise documented below in the visit note.  

## 2013-10-25 NOTE — Progress Notes (Signed)
Patient ID: Crystal Meyers, female   DOB: [birth year], 68 y.o.   MRN: [id number]  Patient Active Problem List   Diagnosis Date Noted  . Trigeminal neuralgia 10/27/2013  . Tick bite 02/19/2013  . Overweight (BMI 25.0-29.9) 08/28/2012  . Routine general medical examination at a health care facility 08/27/2012  . Barrett's esophagus 08/27/2012  . Sleep apnea, obstructive 10/14/2011  . 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 Tobacco abuse 07/27/2011  . Breast cancer 07/27/2011  . Tobacco abuse counseling 07/27/2011  . Depression 07/27/2011    Subjective:  CC:   Chief Complaint  Patient presents with  . Follow-up    Medications  . Acute Visit    sharp pain in mouth.    HPI:   Crystal Meyers is a 68 y.o. female who presents for Recurrent Right sided facial pain.  Started over 4 months ago as a nusiance and has become very severe at times  She saw her dentist 4 months ago, with a normal exam.  No history of bruxism.  Pain starts in front of the ear ,  And radiates to right side of the mouth,  Aggravated by chewing,  Talking, anything that moves the muscles, as well as by touching the teeth on the right side. .  No history of shingles .[name]  Also gets numbness on the upper lip on the right side. Pain is not aggravated  by lying on right side.  Severe at times.     Past Medical History  Diagnosis Date  . Depression   . Hypertension   . Hypercholesteremia   . Breast cancer 7/10    left , invasive lobular carcinoma  . Sleep apnea   . Hypothyroidism     Past Surgical History  Procedure Laterality Date  . Mastectomy  05/2009    bilateral, reconstructive surgery 09/2009  . Mastectomy      Bilateral        The following portions of the patient's history were reviewed and updated as appropriate: Allergies, current medications, and problem list.    Review of Systems:   Patient denies headache, fevers, malaise, unintentional weight loss, skin rash,  eye pain, sinus congestion and sinus pain, sore throat, dysphagia,  hemoptysis , cough, dyspnea, wheezing, chest pain, palpitations, orthopnea, edema, abdominal pain, nausea, melena, diarrhea, constipation, flank pain, dysuria, hematuria, urinary  Frequency, nocturia, numbness, tingling, seizures,  Focal weakness, Loss of consciousness,  Tremor, insomnia, depression, anxiety, and suicidal ideation.     History   Social History  . Marital Status: Widowed    Spouse Name: N/A    Number of Children: N/A  . Years of Education: N/A   Occupational History  . Not on file.   Social History Main Topics  . Smoking status: Current Every Day Smoker    Types: Cigarettes  . Smokeless tobacco: Never Used  . Alcohol Use: No  . Drug Use: No  . Sexual Activity: Not on file   Other Topics Concern  . Not on file   Social History Narrative   Lives with mother.   Recently widowed.   Always uses seat belts   Has a bird and a dog.   No exercise.    Objective:  Filed Vitals:   10/25/13 0916  BP: 118/74  Pulse: 80  Temp: 98.2 F (36.8 C)  Resp: 16     General appearance: alert, cooperative and appears stated age Ears: normal TM's and external ear canals both ears Throat: lips,  mucosa, and tongue normal; teeth and gums normal Neck: no adenopathy, no carotid bruit, supple, symmetrical, trachea midline and thyroid not enlarged, symmetric, no tenderness/mass/nodules Back: symmetric, no curvature. ROM normal. No CVA tenderness. Lungs: clear to auscultation bilaterally Heart: regular rate and rhythm, S1, S2 normal, no murmur, click, rub or gallop Abdomen: soft, non-tender; bowel sounds normal; no masses,  no organomegaly Pulses: 2+ and symmetric Skin: Skin color, texture, turgor normal. No rashes or lesions Lymph nodes: Cervical, supraclavicular, and axillary nodes normal.  Assessment and Plan:  Trigeminal neuralgia Trial of tegretol .  Dose titration discussed. Neurology referral made.    Tobacco abuse She has started smoking again.  The patient was counseled on the dangers of tobacco use, and was advised to quit.  Reviewed strategies to maximize success, including removing cigarettes and smoking materials from environment.  Overweight (BMI 25.0-29.9) I have congratulated her in reduction of   BMI and encouraged  Continued weight loss with goal of 10% of body weigh over the next 6 months using a low glycemic index diet and regular exercise a minimum of 5 days per week.     Updated Medication List Outpatient Encounter Prescriptions as of 10/25/2013  Medication Sig  . b complex vitamins tablet Take 1 tablet by mouth daily.  . Calcium Carbonate-Vitamin D (CALTRATE 600+D PO) Take by mouth daily.  . Eszopiclone (ESZOPICLONE) 3 MG TABS Take 1 tablet (3 mg total) by mouth at bedtime. Take immediately before bedtime  . letrozole (FEMARA) 2.5 MG tablet Take 2.5 mg by mouth daily.  [name] levothyroxine (SYNTHROID, LEVOTHROID) 75 MCG tablet TAKE 1 TABLET BY MOUTH EVERY DAY  . lisinopril (PRINIVIL,ZESTRIL) 40 MG tablet TAKE 0.5 TABLET BY MOUTH DAILY  . omeprazole (PRILOSEC) 40 MG capsule TAKE ONE CAPSULE BY MOUTH EVERY DAY  . Red Yeast Rice 600 MG TABS Take by mouth daily.  [name] venlafaxine XR (EFFEXOR-XR) 150 MG 24 hr capsule TAKE ONE CAPSULE BY MOUTH EVERY DAY  . [DISCONTINUED] lisinopril (PRINIVIL,ZESTRIL) 40 MG tablet TAKE 1 TABLET BY MOUTH DAILY  . carbamazepine (TEGRETOL) 200 MG tablet 1 tablet 2 times daily,  Increase to 2 tablets twice daily after one week if needed to control pain  . [DISCONTINUED] doxycycline (VIBRA-TABS) 100 MG tablet Take 1 tablet (100 mg total) by mouth 2 (two) times daily.  . [DISCONTINUED] lisinopril (PRINIVIL,ZESTRIL) 40 MG tablet   . [DISCONTINUED] predniSONE (DELTASONE) 10 MG tablet Start with 60 mg (6 tablets) then decrease by 10 mg (1 tablet) daily until done.  . [DISCONTINUED] promethazine (PHENERGAN) 12.5 MG tablet Take 1 tablet (12.5 mg total) by mouth  every 6 (six) hours as needed for nausea.     Orders Placed This Encounter  Procedures  . HM MAMMOGRAPHY  . HM MAMMOGRAPHY  . HM PAP SMEAR  . CBC with Differential  . Comprehensive metabolic panel  . TSH  . Lipid panel  . Vit D  25 hydroxy (rtn osteoporosis monitoring)  . Microalbumin / creatinine urine ratio  . Hemoglobin A1c  . Sedimentation rate  . C-reactive protein  . Ambulatory referral to Neurology  . HM COLONOSCOPY    No Follow-up on file.

## 2013-10-26 ENCOUNTER — Telehealth: Payer: Self-pay | Admitting: Internal Medicine

## 2013-10-26 LAB — VITAMIN D 25 HYDROXY (VIT D DEFICIENCY, FRACTURES): VIT D 25 HYDROXY: 79 ng/mL (ref 30–89)

## 2013-10-26 NOTE — Telephone Encounter (Signed)
Relevant patient education assigned to patient using Emmi. ° °

## 2013-10-27 ENCOUNTER — Encounter: Payer: Self-pay | Admitting: Internal Medicine

## 2013-10-27 DIAGNOSIS — G500 Trigeminal neuralgia: Secondary | ICD-10-CM | POA: Insufficient documentation

## 2013-10-27 NOTE — Assessment & Plan Note (Signed)
Trial of tegretol .  Dose titration discussed. Neurology referral made.

## 2013-10-27 NOTE — Assessment & Plan Note (Signed)
She has started smoking again.  The patient was counseled on the dangers of tobacco use, and was advised to quit.  Reviewed strategies to maximize success, including removing cigarettes and smoking materials from environment.

## 2013-10-27 NOTE — Assessment & Plan Note (Signed)
I have congratulated her in reduction of   BMI and encouraged  Continued weight loss with goal of 10% of body weigh over the next 6 months using a low glycemic index diet and regular exercise a minimum of 5 days per week.    

## 2013-10-28 ENCOUNTER — Telehealth: Payer: Self-pay | Admitting: Internal Medicine

## 2013-10-28 NOTE — Telephone Encounter (Signed)
Relevant patient education assigned to patient using Emmi. ° °

## 2013-10-30 NOTE — Telephone Encounter (Signed)
Mailed unread message to pt, requested call back to discuss simvastatin. Pt also has CPE scheduled 11/25/13

## 2013-11-04 ENCOUNTER — Encounter: Payer: Self-pay | Admitting: Internal Medicine

## 2013-11-04 ENCOUNTER — Other Ambulatory Visit: Payer: Self-pay | Admitting: Internal Medicine

## 2013-11-04 DIAGNOSIS — E785 Hyperlipidemia, unspecified: Secondary | ICD-10-CM

## 2013-11-04 DIAGNOSIS — Z79899 Other long term (current) drug therapy: Secondary | ICD-10-CM

## 2013-11-04 MED ORDER — LISINOPRIL 40 MG PO TABS: ORAL_TABLET | ORAL | Status: DC

## 2013-11-04 MED ORDER — VENLAFAXINE HCL ER 150 MG PO CP24
ORAL_CAPSULE | ORAL | Status: DC
Start: 2013-11-04 — End: 2013-11-04

## 2013-11-04 MED ORDER — OMEPRAZOLE 40 MG PO CPDR: DELAYED_RELEASE_CAPSULE | ORAL | Status: DC

## 2013-11-04 MED ORDER — LEVOTHYROXINE SODIUM 75 MCG PO TABS: ORAL_TABLET | ORAL | Status: DC

## 2013-11-04 MED ORDER — VENLAFAXINE HCL ER 150 MG PO CP24: ORAL_CAPSULE | ORAL | Status: DC

## 2013-11-04 NOTE — Telephone Encounter (Signed)
Pt requested 90 day supply. Rx sent to pharmacy by escript

## 2013-11-04 NOTE — Telephone Encounter (Signed)
In response to Dr. Germaine Pomfret message about starting simvastatin

## 2013-11-05 MED ORDER — SIMVASTATIN 20 MG PO TABS: 20.0000 mg | ORAL_TABLET | Freq: Every day | ORAL | Status: DC

## 2013-11-20 NOTE — Telephone Encounter (Signed)
Mailed unread message to pt  

## 2013-11-25 ENCOUNTER — Encounter: Payer: Self-pay | Admitting: Internal Medicine

## 2013-11-25 ENCOUNTER — Ambulatory Visit (INDEPENDENT_AMBULATORY_CARE_PROVIDER_SITE_OTHER): Payer: Medicare Other | Admitting: Internal Medicine

## 2013-11-25 VITALS — BP 118/68 | HR 75 | Temp 98.3°F | Resp 16 | Ht 66.0 in | Wt 169.0 lb

## 2013-11-25 DIAGNOSIS — Z79899 Other long term (current) drug therapy: Secondary | ICD-10-CM | POA: Diagnosis not present

## 2013-11-25 DIAGNOSIS — Z23 Encounter for immunization: Secondary | ICD-10-CM

## 2013-11-25 DIAGNOSIS — F17200 Nicotine dependence, unspecified, uncomplicated: Secondary | ICD-10-CM

## 2013-11-25 DIAGNOSIS — Z716 Tobacco abuse counseling: Secondary | ICD-10-CM

## 2013-11-25 DIAGNOSIS — Z0000 Encounter for general adult medical examination without abnormal findings: Secondary | ICD-10-CM

## 2013-11-25 DIAGNOSIS — R748 Abnormal levels of other serum enzymes: Secondary | ICD-10-CM

## 2013-11-25 DIAGNOSIS — Z7189 Other specified counseling: Secondary | ICD-10-CM

## 2013-11-25 DIAGNOSIS — G4733 Obstructive sleep apnea (adult) (pediatric): Secondary | ICD-10-CM

## 2013-11-25 DIAGNOSIS — G500 Trigeminal neuralgia: Secondary | ICD-10-CM

## 2013-11-25 DIAGNOSIS — J439 Emphysema, unspecified: Secondary | ICD-10-CM

## 2013-11-25 DIAGNOSIS — J438 Other emphysema: Secondary | ICD-10-CM

## 2013-11-25 DIAGNOSIS — Z720 Tobacco use: Secondary | ICD-10-CM

## 2013-11-25 DIAGNOSIS — C50919 Malignant neoplasm of unspecified site of unspecified female breast: Secondary | ICD-10-CM

## 2013-11-25 LAB — CBC WITH DIFFERENTIAL/PLATELET
BASOS ABS: 0 10*3/uL (ref 0.0–0.1)
Basophils Relative: 0.6 % (ref 0.0–3.0)
EOS ABS: 0.2 10*3/uL (ref 0.0–0.7)
Eosinophils Relative: 2.9 % (ref 0.0–5.0)
HCT: 37.3 % (ref 36.0–46.0)
Hemoglobin: 12.6 g/dL (ref 12.0–15.0)
LYMPHS PCT: 25 % (ref 12.0–46.0)
Lymphs Abs: 1.4 10*3/uL (ref 0.7–4.0)
MCHC: 33.8 g/dL (ref 30.0–36.0)
MCV: 93.2 fl (ref 78.0–100.0)
Monocytes Absolute: 0.5 10*3/uL (ref 0.1–1.0)
Monocytes Relative: 8.9 % (ref 3.0–12.0)
NEUTROS PCT: 62.6 % (ref 43.0–77.0)
Neutro Abs: 3.6 10*3/uL (ref 1.4–7.7)
Platelets: 235 10*3/uL (ref 150.0–400.0)
RBC: 4 Mil/uL (ref 3.87–5.11)
RDW: 12.8 % (ref 11.5–15.5)
WBC: 5.8 10*3/uL (ref 4.0–10.5)

## 2013-11-25 LAB — COMPREHENSIVE METABOLIC PANEL
ALT: 120 U/L — AB (ref 0–35)
AST: 60 U/L — AB (ref 0–37)
Albumin: 3.9 g/dL (ref 3.5–5.2)
Alkaline Phosphatase: 80 U/L (ref 39–117)
BUN: 18 mg/dL (ref 6–23)
CALCIUM: 9.5 mg/dL (ref 8.4–10.5)
CHLORIDE: 93 meq/L — AB (ref 96–112)
CO2: 28 mEq/L (ref 19–32)
Creatinine, Ser: 0.8 mg/dL (ref 0.4–1.2)
GFR: 77.52 mL/min (ref >60.00)
Glucose, Bld: 91 mg/dL (ref 70–99)
POTASSIUM: 4.6 meq/L (ref 3.5–5.1)
SODIUM: 130 meq/L — AB (ref 135–145)
TOTAL PROTEIN: 6.5 g/dL (ref 6.0–8.3)
Total Bilirubin: 0.5 mg/dL (ref 0.2–1.2)

## 2013-11-25 NOTE — Assessment & Plan Note (Addendum)
Suspected by symptoms which improved with bid tegretol to which she attributes excessive sedation.  Change to evening and late morning dosing . Keep appt with Dr Melrose Nakayama

## 2013-11-25 NOTE — Progress Notes (Addendum)
Patient ID: Crystal Meyers, female   DOB: 09-Apr-1946, 68 y.o.   MRN: [id number]   T Subjective:   The patient is here for annual Medicare wellness examination and management of other chronic and acute problems.   The risk factors are reflected in the social history.  The roster of all physicians providing medical care to patient - is listed in the Snapshot section of the chart.  Activities of daily living:  The patient is 100% independent in all ADLs: dressing, toileting, feeding as well as independent mobility  Home safety : The patient has smoke detectors in the home. They wear seatbelts.  There are no firearms at home. There is no violence in the home.   There is no risks for hepatitis, STDs or HIV. There is no   history of blood transfusion. They have no travel history to infectious disease endemic areas of the world.  The patient has seen their dentist in the last six month. They have seen their eye doctor in the last year. They admit to slight hearing difficulty with regard to whispered voices and some television programs.  They have deferred audiologic testing in the last year.  They do not  have excessive sun exposure. Discussed the need for sun protection: hats, long sleeves and use of sunscreen if there is significant sun exposure.   Diet: the importance of a healthy diet is discussed. They do have a healthy diet.  The benefits of regular aerobic exercise were discussed. She walks 4 times per week ,  20 minutes.   Depression screen: there are no signs or vegative symptoms of depression- irritability, change in appetite, anhedonia, sadness/tearfullness.  Cognitive assessment: the patient manages all their financial and personal affairs and is actively engaged. They could relate day,date,year and events; recalled 2/3 objects at 3 minutes; performed clock-face test normally.  The following portions of the patient's history were reviewed and updated as appropriate: allergies, current  medications, past family history, past medical history,  past surgical history, past social history  and problem list.  Visual acuity was not assessed per patient preference since she has regular follow up with her ophthalmologist. Hearing and body mass index were assessed and reviewed.   During the course of the visit the patient was educated and counseled about appropriate screening and preventive services including : fall prevention , diabetes screening, nutrition counseling, colorectal cancer screening, and recommended immunizations.     History   Social History  . Marital Status: Widowed    Spouse Name: N/A    Number of Children: N/A  . Years of Education: N/A   Occupational History  . Not on file.   Social History Main Topics  . Smoking status: Current Every Day Smoker    Types: Cigarettes  . Smokeless tobacco: Never Used  . Alcohol Use: No  . Drug Use: No  . Sexual Activity: Not on file   Other Topics Concern  . Not on file   Social History Narrative   Lives with mother.   Recently widowed.   Always uses seat belts   Has a bird and a dog.   No exercise.   Health Maintenance  Topic Date Due  . Tetanus/tdap  06/08/1967  . Zostavax  06/07/2008  . Pneumococcal Polysaccharide Vaccine Age 15 And Over  06/07/2013  . Influenza Vaccine  02/08/2014  . Mammogram  10/26/2014  . Colonoscopy  09/04/2021    The following portions of the patient's history were reviewed and updated as appropriate:  allergies, current medications, past family history, past medical history, past social history, past surgical history and problem list.  Review of Systems A comprehensive review of systems was negative.   Objective:   BP 118/68  Pulse 75  Temp(Src) 98.3 F (36.8 C) (Oral)  Resp 16  Ht 5\' 6"  (1.676 m)  Wt 169 lb (76.658 kg)  BMI 27.29 kg/m2  SpO2 97%  General appearance: alert, cooperative and appears stated age Head: Normocephalic, without obvious abnormality,  atraumatic Eyes: conjunctivae/corneas clear. PERRL, EOM's intact. Fundi benign. Ears: normal TM's and external ear canals both ears Nose: Nares normal. Septum midline. Mucosa normal. No drainage or sinus tenderness. Throat: lips, mucosa, and tongue normal; teeth and gums normal Neck: no adenopathy, no carotid bruit, no JVD, supple, symmetrical, trachea midline and thyroid not enlarged, symmetric, no tenderness/mass/nodules Lungs: clear to auscultation bilaterally Breasts: asymmetric breasts s/p bilateral mastectomy with reconstruction.  Right breast very pendulous and larger than left  CV: regular rate and rhythm, S1, S2 normal, no murmur, click, rub or gallop Abdomen: soft, non-tender; bowel sounds normal; no masses,  no organomegaly Extremities: extremities normal, atraumatic, no cyanosis or edema Pulses: 2+ and symmetric Skin: Skin color, texture, turgor normal. No rashes or lesions Neurologic: Alert and oriented X 3, normal strength and tone. Normal symmetric reflexes. Normal coordination and gait.     Assessment and Plan:    Sleep apnea, obstructive Diagnosed by sleep study. She is wearing her CPAP every night a minimum of 6 hours per night.   Trigeminal neuralgia Suspected by symptoms which improved with bid tegretol to which she attributes excessive sedation.  Change to evening and late morning dosing . Keep appt with Dr Melrose Nakayama  COPD (chronic obstructive pulmonary disease) with emphysema .lungs are clear today and she is walking leisurely without dyspnea.  Urged to quit smoking.  Tobacco abuse She is smoking 1 pack daily and has little motivation to quit.   Tobacco abuse counseling  The patient was counseled on the dangers of tobacco use, and was advised to quit.  Reviewed strategies to maximize success, including removing cigarettes and smoking materials from environment.    Routine general medical examination at a health care facility Annual comprehensive exam was done  including breast, excluding pelvic and PAP smear. All screenings have been addressed .   Breast cancer S/p double radical mastectomies with suboptimal reconstruction done by local plastic surgeon Dr Jon Billings. Dr. Oliva Bustard managing.    Updated Medication List Outpatient Encounter Prescriptions as of 11/25/2013  Medication Sig  . b complex vitamins tablet Take 1 tablet by mouth daily.  . Calcium Carbonate-Vitamin D (CALTRATE 600+D PO) Take by mouth daily.  . carbamazepine (TEGRETOL) 200 MG tablet 1 tablet 2 times daily,  Increase to 2 tablets twice daily after one week if needed to control pain  . Eszopiclone (ESZOPICLONE) 3 MG TABS Take 1 tablet (3 mg total) by mouth at bedtime. Take immediately before bedtime  . letrozole (FEMARA) 2.5 MG tablet Take 2.5 mg by mouth daily.  [name] levothyroxine (SYNTHROID, LEVOTHROID) 75 MCG tablet TAKE 1 TABLET BY MOUTH EVERY DAY  . lisinopril (PRINIVIL,ZESTRIL) 40 MG tablet TAKE 0.5 TABLET BY MOUTH DAILY  . omeprazole (PRILOSEC) 40 MG capsule TAKE ONE CAPSULE BY MOUTH EVERY DAY  . simvastatin (ZOCOR) 20 MG tablet Take 1 tablet (20 mg total) by mouth at bedtime.  [name] venlafaxine XR (EFFEXOR-XR) 150 MG 24 hr capsule TAKE ONE CAPSULE BY MOUTH EVERY DAY  . Red Yeast Rice 600  MG TABS Take by mouth daily.

## 2013-11-25 NOTE — Assessment & Plan Note (Signed)
S/p double radical mastectomies with suboptimal reconstruction done by local plastic surgeon Dr Jon Billings. Dr. Oliva Bustard managing.

## 2013-11-25 NOTE — Assessment & Plan Note (Signed)
The patient was counseled on the dangers of tobacco use, and was advised to quit.  Reviewed strategies to maximize success, including removing cigarettes and smoking materials from environment. 

## 2013-11-25 NOTE — Assessment & Plan Note (Signed)
.  lungs are clear today and she is walking leisurely without dyspnea.  Urged to quit smoking.

## 2013-11-25 NOTE — Assessment & Plan Note (Signed)
Diagnosed by sleep study. She is wearing her CPAP every night a minimum of 6 hours per night.  

## 2013-11-25 NOTE — Assessment & Plan Note (Signed)
She is smoking 1 pack daily and has little motivation to quit.

## 2013-11-25 NOTE — Patient Instructions (Addendum)
You had your annual  wellness exam today.  We will repeat your PAP smear in 2017,  sooner if needed   You received the Prevnar vaccine today.    We will contact you with the bloodwork results  Please keep the appt with Dr Melrose Nakayama to follow up on your trigeminal neuralgia   Smoking Cessation, Tips for Success If you are ready to quit smoking, congratulations! You have chosen to help yourself be healthier. Cigarettes bring nicotine, tar, carbon monoxide, and other irritants into your body. Your lungs, heart, and blood vessels will be able to work better without these poisons. There are many different ways to quit smoking. Nicotine gum, nicotine patches, a nicotine inhaler, or nicotine nasal spray can help with physical craving. Hypnosis, support groups, and medicines help break the habit of smoking. WHAT THINGS CAN I DO TO MAKE QUITTING EASIER?  Here are some tips to help you quit for good:  Pick a date when you will quit smoking completely. Tell all of your friends and family about your plan to quit on that date.  Do not try to slowly cut down on the number of cigarettes you are smoking. Pick a quit date and quit smoking completely starting on that day.  Throw away all cigarettes.   Clean and remove all ashtrays from your home, work, and car.   On a card, write down your reasons for quitting. Carry the card with you and read it when you get the urge to smoke.   Cleanse your body of nicotine. Drink enough water and fluids to keep your urine clear or pale yellow. Do this after quitting to flush the nicotine from your body.   Learn to predict your moods. Do not let a bad situation be your excuse to have a cigarette. Some situations in your life might tempt you into wanting a cigarette.   Never have "just one" cigarette. It leads to wanting another and another. Remind yourself of your decision to quit.   Change habits associated with smoking. If you smoked while driving or when feeling  stressed, try other activities to replace smoking. Stand up when drinking your coffee. Brush your teeth after eating. Sit in a different chair when you read the paper. Avoid alcohol while trying to quit, and try to drink fewer caffeinated beverages. Alcohol and caffeine may urge you to smoke.   Avoid foods and drinks that can trigger a desire to smoke, such as sugary or spicy foods and alcohol.   Ask people who smoke not to smoke around you.   Have something planned to do right after eating or having a cup of coffee. For example, plan to take a walk or exercise.   Try a relaxation exercise to calm you down and decrease your stress. Remember, you may be tense and nervous for the first 2 weeks after you quit, but this will pass.   Find new activities to keep your hands busy. Play with a pen, coin, or rubber band. Doodle or draw things on paper.   Brush your teeth right after eating. This will help cut down on the craving for the taste of tobacco after meals. You can also try mouthwash.   Use oral substitutes in place of cigarettes. Try using lemon drops, carrots, cinnamon sticks, or chewing gum. Keep them handy so they are available when you have the urge to smoke.   When you have the urge to smoke, try deep breathing.   Designate your home as a  nonsmoking area.   If you are a heavy smoker, ask your health care provider about a prescription for nicotine chewing gum. It can ease your withdrawal from nicotine.   Reward yourself. Set aside the cigarette money you save and buy yourself something nice.   Look for support from others. Join a support group or smoking cessation program. Ask someone at home or at work to help you with your plan to quit smoking.   Always ask yourself, "Do I need this cigarette or is this just a reflex?" Tell yourself, "Today, I choose not to smoke," or "I do not want to smoke." You are reminding yourself of your decision to quit.  Do not replace  cigarette smoking with electronic cigarettes (commonly called e-cigarettes). The safety of e-cigarettes is unknown, and some may contain harmful chemicals.  If you relapse, do not give up! Plan ahead and think about what you will do the next time you get the urge to smoke.  HOW WILL I FEEL WHEN I QUIT SMOKING? You may have symptoms of withdrawal because your body is used to nicotine (the addictive substance in cigarettes). You may crave cigarettes, be irritable, feel very hungry, cough often, get headaches, or have difficulty concentrating. The withdrawal symptoms are only temporary. They are strongest when you first quit but will go away within 10 14 days. When withdrawal symptoms occur, stay in control. Think about your reasons for quitting. Remind yourself that these are signs that your body is healing and getting used to being without cigarettes. Remember that withdrawal symptoms are easier to treat than the major diseases that smoking can cause.  Even after the withdrawal is over, expect periodic urges to smoke. However, these cravings are generally short lived and will go away whether you smoke or not. Do not smoke!  WHAT RESOURCES ARE AVAILABLE TO HELP ME QUIT SMOKING? Your health care provider can direct you to community resources or hospitals for support, which may include:  Group support.  Education.  Hypnosis.  Therapy. Document Released: 03/25/2004 Document Revised: 04/17/2013 Document Reviewed: 12/13/2012 [facility] Patient Information 2014 [place], [place].

## 2013-11-25 NOTE — Progress Notes (Signed)
Pre-visit discussion using our clinic review tool. No additional management support is needed unless otherwise documented below in the visit note.  

## 2013-11-25 NOTE — Addendum Note (Signed)
Addended by: Nanci Pina on: 11/25/2013 04:52 PM   Modules accepted: Orders

## 2013-11-25 NOTE — Assessment & Plan Note (Signed)
Annual comprehensive exam was done including breast, excluding pelvic and PAP smear. All screenings have been addressed .  

## 2013-11-27 ENCOUNTER — Encounter: Payer: Self-pay | Admitting: Internal Medicine

## 2013-11-27 NOTE — Addendum Note (Signed)
Addended by: [name] on: 11/27/2013 01:00 PM   Modules accepted: Orders

## 2013-11-28 DIAGNOSIS — G629 Polyneuropathy, unspecified: Secondary | ICD-10-CM | POA: Insufficient documentation

## 2013-11-28 DIAGNOSIS — F17200 Nicotine dependence, unspecified, uncomplicated: Secondary | ICD-10-CM | POA: Diagnosis not present

## 2013-11-28 DIAGNOSIS — G500 Trigeminal neuralgia: Secondary | ICD-10-CM | POA: Diagnosis not present

## 2013-11-29 ENCOUNTER — Other Ambulatory Visit (INDEPENDENT_AMBULATORY_CARE_PROVIDER_SITE_OTHER): Payer: Medicare Other

## 2013-11-29 DIAGNOSIS — R748 Abnormal levels of other serum enzymes: Secondary | ICD-10-CM

## 2013-11-29 LAB — COMPREHENSIVE METABOLIC PANEL
ALT: 53 U/L — ABNORMAL HIGH (ref 0–35)
AST: 28 U/L (ref 0–37)
Albumin: 3.7 g/dL (ref 3.5–5.2)
Alkaline Phosphatase: 71 U/L (ref 39–117)
BUN: 14 mg/dL (ref 6–23)
CALCIUM: 9.2 mg/dL (ref 8.4–10.5)
CHLORIDE: 88 meq/L — AB (ref 96–112)
CO2: 29 mEq/L (ref 19–32)
CREATININE: 0.8 mg/dL (ref 0.4–1.2)
GFR: 78.66 mL/min (ref >60.00)
Glucose, Bld: 107 mg/dL — ABNORMAL HIGH (ref 70–99)
Potassium: 4.3 mEq/L (ref 3.5–5.1)
Sodium: 125 mEq/L — ABNORMAL LOW (ref 135–145)
Total Bilirubin: 0.5 mg/dL (ref 0.2–1.2)
Total Protein: 6.6 g/dL (ref 6.0–8.3)

## 2013-12-01 ENCOUNTER — Encounter: Payer: Self-pay | Admitting: Internal Medicine

## 2013-12-05 ENCOUNTER — Other Ambulatory Visit: Payer: Self-pay | Admitting: Internal Medicine

## 2013-12-05 ENCOUNTER — Telehealth: Payer: Self-pay | Admitting: *Deleted

## 2013-12-05 DIAGNOSIS — E871 Hypo-osmolality and hyponatremia: Secondary | ICD-10-CM

## 2013-12-05 NOTE — Telephone Encounter (Signed)
What labs and dx?  

## 2013-12-06 ENCOUNTER — Other Ambulatory Visit (INDEPENDENT_AMBULATORY_CARE_PROVIDER_SITE_OTHER): Payer: Medicare Other

## 2013-12-06 DIAGNOSIS — E871 Hypo-osmolality and hyponatremia: Secondary | ICD-10-CM

## 2013-12-06 LAB — COMPREHENSIVE METABOLIC PANEL
ALBUMIN: 4.1 g/dL (ref 3.5–5.2)
ALT: 34 U/L (ref 0–35)
AST: 26 U/L (ref 0–37)
Alkaline Phosphatase: 61 U/L (ref 39–117)
BUN: 18 mg/dL (ref 6–23)
CALCIUM: 9.9 mg/dL (ref 8.4–10.5)
CHLORIDE: 99 meq/L (ref 96–112)
CO2: 28 mEq/L (ref 19–32)
Creatinine, Ser: 0.9 mg/dL (ref 0.4–1.2)
GFR: 69.35 mL/min (ref >60.00)
Glucose, Bld: 94 mg/dL (ref 70–99)
POTASSIUM: 4.8 meq/L (ref 3.5–5.1)
Sodium: 136 mEq/L (ref 135–145)
Total Bilirubin: 0.5 mg/dL (ref 0.2–1.2)
Total Protein: 6.8 g/dL (ref 6.0–8.3)

## 2013-12-08 ENCOUNTER — Other Ambulatory Visit: Payer: Self-pay | Admitting: Internal Medicine

## 2013-12-08 ENCOUNTER — Encounter: Payer: Self-pay | Admitting: Internal Medicine

## 2013-12-17 NOTE — Telephone Encounter (Signed)
Called and notified pt of unread message, verbalized understanding.

## 2013-12-24 DIAGNOSIS — D239 Other benign neoplasm of skin, unspecified: Secondary | ICD-10-CM | POA: Diagnosis not present

## 2013-12-24 DIAGNOSIS — W57XXXA Bitten or stung by nonvenomous insect and other nonvenomous arthropods, initial encounter: Secondary | ICD-10-CM | POA: Diagnosis not present

## 2013-12-24 DIAGNOSIS — L820 Inflamed seborrheic keratosis: Secondary | ICD-10-CM | POA: Diagnosis not present

## 2013-12-24 DIAGNOSIS — T148 Other injury of unspecified body region: Secondary | ICD-10-CM | POA: Diagnosis not present

## 2013-12-24 DIAGNOSIS — L819 Disorder of pigmentation, unspecified: Secondary | ICD-10-CM | POA: Diagnosis not present

## 2013-12-24 DIAGNOSIS — D692 Other nonthrombocytopenic purpura: Secondary | ICD-10-CM | POA: Diagnosis not present

## 2013-12-24 DIAGNOSIS — D1800 Hemangioma unspecified site: Secondary | ICD-10-CM | POA: Diagnosis not present

## 2013-12-24 DIAGNOSIS — D2330 Other benign neoplasm of skin of unspecified part of face: Secondary | ICD-10-CM | POA: Diagnosis not present

## 2013-12-24 DIAGNOSIS — L821 Other seborrheic keratosis: Secondary | ICD-10-CM | POA: Diagnosis not present

## 2014-02-12 DIAGNOSIS — R519 Headache, unspecified: Secondary | ICD-10-CM | POA: Insufficient documentation

## 2014-02-12 DIAGNOSIS — R51 Headache: Secondary | ICD-10-CM | POA: Diagnosis not present

## 2014-02-12 DIAGNOSIS — F17200 Nicotine dependence, unspecified, uncomplicated: Secondary | ICD-10-CM | POA: Diagnosis not present

## 2014-02-12 DIAGNOSIS — G589 Mononeuropathy, unspecified: Secondary | ICD-10-CM | POA: Diagnosis not present

## 2014-03-09 ENCOUNTER — Other Ambulatory Visit: Payer: Self-pay | Admitting: Internal Medicine

## 2014-03-26 ENCOUNTER — Ambulatory Visit: Payer: Self-pay | Admitting: Oncology

## 2014-03-26 DIAGNOSIS — Z79811 Long term (current) use of aromatase inhibitors: Secondary | ICD-10-CM | POA: Diagnosis not present

## 2014-03-26 DIAGNOSIS — F17200 Nicotine dependence, unspecified, uncomplicated: Secondary | ICD-10-CM | POA: Diagnosis not present

## 2014-03-26 DIAGNOSIS — K219 Gastro-esophageal reflux disease without esophagitis: Secondary | ICD-10-CM | POA: Diagnosis not present

## 2014-03-26 DIAGNOSIS — F329 Major depressive disorder, single episode, unspecified: Secondary | ICD-10-CM | POA: Diagnosis not present

## 2014-03-26 DIAGNOSIS — C50919 Malignant neoplasm of unspecified site of unspecified female breast: Secondary | ICD-10-CM | POA: Diagnosis not present

## 2014-03-26 DIAGNOSIS — I10 Essential (primary) hypertension: Secondary | ICD-10-CM | POA: Diagnosis not present

## 2014-03-26 DIAGNOSIS — Z79899 Other long term (current) drug therapy: Secondary | ICD-10-CM | POA: Diagnosis not present

## 2014-03-26 DIAGNOSIS — Z170 Estrogen receptor positive status [ER+]: Secondary | ICD-10-CM | POA: Diagnosis not present

## 2014-03-26 DIAGNOSIS — E039 Hypothyroidism, unspecified: Secondary | ICD-10-CM | POA: Diagnosis not present

## 2014-03-26 DIAGNOSIS — J449 Chronic obstructive pulmonary disease, unspecified: Secondary | ICD-10-CM | POA: Diagnosis not present

## 2014-03-26 DIAGNOSIS — G2581 Restless legs syndrome: Secondary | ICD-10-CM | POA: Diagnosis not present

## 2014-03-26 DIAGNOSIS — Z9010 Acquired absence of unspecified breast and nipple: Secondary | ICD-10-CM | POA: Diagnosis not present

## 2014-03-26 DIAGNOSIS — F3289 Other specified depressive episodes: Secondary | ICD-10-CM | POA: Diagnosis not present

## 2014-03-26 LAB — COMPREHENSIVE METABOLIC PANEL
ALK PHOS: 45 U/L — AB
AST: 23 U/L (ref 15–37)
Albumin: 3.7 g/dL (ref 3.4–5.0)
Anion Gap: 6 — ABNORMAL LOW (ref 7–16)
BILIRUBIN TOTAL: 0.3 mg/dL (ref 0.2–1.0)
BUN: 19 mg/dL — AB (ref 7–18)
CO2: 30 mmol/L (ref 21–32)
Calcium, Total: 9.5 mg/dL (ref 8.5–10.1)
Chloride: 104 mmol/L (ref 98–107)
Creatinine: 1.12 mg/dL (ref 0.60–1.30)
EGFR (African American): 60 — ABNORMAL LOW
EGFR (Non-African Amer.): 52 — ABNORMAL LOW
Glucose: 91 mg/dL (ref 65–99)
Osmolality: 281 (ref 275–301)
POTASSIUM: 4 mmol/L (ref 3.5–5.1)
SGPT (ALT): 35 U/L
Sodium: 140 mmol/L (ref 136–145)
Total Protein: 6.9 g/dL (ref 6.4–8.2)

## 2014-03-26 LAB — CBC CANCER CENTER
BASOS ABS: 0 x10 3/mm (ref 0.0–0.1)
Basophil %: 0.5 %
EOS ABS: 0.2 x10 3/mm (ref 0.0–0.7)
Eosinophil %: 3.4 %
HCT: 37.7 % (ref 35.0–47.0)
HGB: 12.6 g/dL (ref 12.0–16.0)
LYMPHS PCT: 39 %
Lymphocyte #: 2.8 x10 3/mm (ref 1.0–3.6)
MCH: 31.5 pg (ref 26.0–34.0)
MCHC: 33.5 g/dL (ref 32.0–36.0)
MCV: 94 fL (ref 80–100)
MONO ABS: 0.6 x10 3/mm (ref 0.2–0.9)
Monocyte %: 8.8 %
NEUTROS ABS: 3.5 x10 3/mm (ref 1.4–6.5)
Neutrophil %: 48.3 %
Platelet: 206 x10 3/mm (ref 150–440)
RBC: 4.01 10*6/uL (ref 3.80–5.20)
RDW: 12.3 % (ref 11.5–14.5)
WBC: 7.3 x10 3/mm (ref 3.6–11.0)

## 2014-03-27 LAB — CANCER ANTIGEN 27.29: CA 27.29: 13.8 U/mL (ref 0.0–38.6)

## 2014-04-10 ENCOUNTER — Ambulatory Visit: Payer: Self-pay | Admitting: Oncology

## 2014-04-17 ENCOUNTER — Ambulatory Visit: Payer: Self-pay | Admitting: Family Medicine

## 2014-04-17 DIAGNOSIS — J449 Chronic obstructive pulmonary disease, unspecified: Secondary | ICD-10-CM | POA: Diagnosis not present

## 2014-04-17 DIAGNOSIS — F17210 Nicotine dependence, cigarettes, uncomplicated: Secondary | ICD-10-CM | POA: Diagnosis not present

## 2014-04-17 DIAGNOSIS — I2510 Atherosclerotic heart disease of native coronary artery without angina pectoris: Secondary | ICD-10-CM | POA: Diagnosis not present

## 2014-04-17 DIAGNOSIS — Z122 Encounter for screening for malignant neoplasm of respiratory organs: Secondary | ICD-10-CM | POA: Diagnosis not present

## 2014-04-17 DIAGNOSIS — J439 Emphysema, unspecified: Secondary | ICD-10-CM | POA: Diagnosis not present

## 2014-04-29 ENCOUNTER — Other Ambulatory Visit: Payer: Self-pay | Admitting: Internal Medicine

## 2014-04-29 DIAGNOSIS — H2513 Age-related nuclear cataract, bilateral: Secondary | ICD-10-CM | POA: Diagnosis not present

## 2014-05-15 ENCOUNTER — Telehealth: Payer: Self-pay | Admitting: Internal Medicine

## 2014-05-15 NOTE — Telephone Encounter (Signed)
Patient Information:  Caller Name: Magda Paganini  Phone: [phone]  Patient: Crystal Meyers  Gender: Female  DOB: 1946-02-16  Age: 68 Years  PCP: Deborra Medina (Adults only)  Office Follow Up:  Does the office need to follow up with this patient?: No  Instructions For The Office: N/A  RN Note:  Home care advice and call back parameters reviewed. Understadning expressed.  Symptoms  Reason For Call & Symptoms: Daughter is calling about her mother.  she states that on Tuesday night 05/13/14.  She began to feel nauseated, +sad and teary eyed. she cried all day yesterday 05/24/14 but cannot explain why and very emotional. This is continuing today "being over the top". Easily triggered to cry. She is currently on Effexor. Office has scheduled her an appt for 09:00 a.m. 05/16/14.  Recently noted 2 nodules on Chest xray grain of rice not for biopsy.  Mother is determined that she has cancer.  Questions if anyone loves her and at times "feel angry".  Recent stress of daughter losing her job. Grandchildren live with her .  Reviewed Health History In EMR: Yes  Reviewed Medications In EMR: Yes  Reviewed Allergies In EMR: Yes  Reviewed Surgeries / Procedures: Yes  Date of Onset of Symptoms: 05/13/2014  Guideline(s) Used:  Depression  Disposition Per Guideline:   See Within 3 Days in Office  Reason For Disposition Reached:   Patient wants to be seen  Advice Given:  Reassurance:   People with depression do get through this -- even people who feel as badly as you feel now. You can be helped.  Encourage the caller to talk about his/her problems and feelings.  Offer hope.  Phrases to USE or AVOID  Use: Tell me more about how you are doing, I'm sorry, what is the hardest part of your day  Avoid: I understand how you feel, he is in the Lord's hands, it is for the best  Suggestions for Healthy Living  Eat healthy: Eat a well-balanced diet.  Get more sleep: Most people need 7-8 hours of sleep each  night. Being well-rested improves your attitude and your sense of physical well-being.  Communicate: Share how you are feeling with someone in your life who is a good listener. Make certain that your spouse, family, or friends know how you are feeling.  Exercise regularly: Take a daily walk.  Avoid alcohol.  Stay Active  Get out of your house or apartment periodically. Go on an outing with a family member or a friend. Go to the store. Go to a movie.  Become involved in community activities (e.g., church, school, clubs, parent teacher associations).  Start a new hobby.  Take a daily walk.  Call Back If:  You feel like harming yourself  You become worse.  RN Overrode Recommendation:  Document Patient  Office has scheduled patient an appointment for tomorrow 05/16/14

## 2014-05-15 NOTE — Telephone Encounter (Signed)
FYI- appt has been scheduled for tomorrow.

## 2014-05-16 ENCOUNTER — Ambulatory Visit (INDEPENDENT_AMBULATORY_CARE_PROVIDER_SITE_OTHER): Payer: Medicare Other | Admitting: Internal Medicine

## 2014-05-16 ENCOUNTER — Encounter: Payer: Self-pay | Admitting: Internal Medicine

## 2014-05-16 VITALS — BP 140/90 | HR 83 | Temp 98.5°F | Resp 16 | Ht 66.0 in | Wt 175.5 lb

## 2014-05-16 DIAGNOSIS — E559 Vitamin D deficiency, unspecified: Secondary | ICD-10-CM | POA: Diagnosis not present

## 2014-05-16 DIAGNOSIS — R5383 Other fatigue: Secondary | ICD-10-CM | POA: Diagnosis not present

## 2014-05-16 DIAGNOSIS — F411 Generalized anxiety disorder: Secondary | ICD-10-CM | POA: Diagnosis not present

## 2014-05-16 DIAGNOSIS — R002 Palpitations: Secondary | ICD-10-CM

## 2014-05-16 DIAGNOSIS — E785 Hyperlipidemia, unspecified: Secondary | ICD-10-CM

## 2014-05-16 DIAGNOSIS — R4182 Altered mental status, unspecified: Secondary | ICD-10-CM | POA: Diagnosis not present

## 2014-05-16 LAB — CBC WITH DIFFERENTIAL/PLATELET
Basophils Absolute: 0 10*3/uL (ref 0.0–0.1)
Basophils Relative: 0.7 % (ref 0.0–3.0)
Eosinophils Absolute: 0.1 10*3/uL (ref 0.0–0.7)
Eosinophils Relative: 1.8 % (ref 0.0–5.0)
HCT: 41.7 % (ref 36.0–46.0)
HEMOGLOBIN: 14 g/dL (ref 12.0–15.0)
Lymphocytes Relative: 40.5 % (ref 12.0–46.0)
Lymphs Abs: 2.6 10*3/uL (ref 0.7–4.0)
MCHC: 33.6 g/dL (ref 30.0–36.0)
MCV: 92.9 fl (ref 78.0–100.0)
MONOS PCT: 6.9 % (ref 3.0–12.0)
Monocytes Absolute: 0.4 10*3/uL (ref 0.1–1.0)
NEUTROS PCT: 50.1 % (ref 43.0–77.0)
Neutro Abs: 3.2 10*3/uL (ref 1.4–7.7)
Platelets: 236 10*3/uL (ref 150.0–400.0)
RBC: 4.49 Mil/uL (ref 3.87–5.11)
RDW: 12.7 % (ref 11.5–15.5)
WBC: 6.5 10*3/uL (ref 4.0–10.5)

## 2014-05-16 LAB — LIPID PANEL
CHOLESTEROL: 188 mg/dL (ref 0–200)
HDL: 55.9 mg/dL (ref >39.00)
LDL CALC: 110 mg/dL — AB (ref 0–99)
NonHDL: 132.1
TRIGLYCERIDES: 109 mg/dL (ref 0.0–149.0)
Total CHOL/HDL Ratio: 3
VLDL: 21.8 mg/dL (ref 0.0–40.0)

## 2014-05-16 LAB — POCT URINALYSIS DIPSTICK
Bilirubin, UA: NEGATIVE
GLUCOSE UA: NEGATIVE
Ketones, UA: NEGATIVE
Leukocytes, UA: NEGATIVE
Nitrite, UA: NEGATIVE
PH UA: 6.5
Protein, UA: NEGATIVE
SPEC GRAV UA: 1.01
Urobilinogen, UA: 0.2

## 2014-05-16 LAB — URINALYSIS, ROUTINE W REFLEX MICROSCOPIC
Bilirubin Urine: NEGATIVE
KETONES UR: NEGATIVE
Leukocytes, UA: NEGATIVE
Nitrite: NEGATIVE
SPECIFIC GRAVITY, URINE: 1.01 (ref 1.000–1.030)
TOTAL PROTEIN, URINE-UPE24: NEGATIVE
Urine Glucose: NEGATIVE
Urobilinogen, UA: 0.2 (ref 0.0–1.0)
pH: 6.5 (ref 5.0–8.0)

## 2014-05-16 LAB — COMPREHENSIVE METABOLIC PANEL
ALK PHOS: 40 U/L (ref 39–117)
ALT: 34 U/L (ref 0–35)
AST: 30 U/L (ref 0–37)
Albumin: 4 g/dL (ref 3.5–5.2)
BUN: 17 mg/dL (ref 6–23)
CHLORIDE: 104 meq/L (ref 96–112)
CO2: 30 mEq/L (ref 19–32)
Calcium: 10.3 mg/dL (ref 8.4–10.5)
Creatinine, Ser: 1 mg/dL (ref 0.4–1.2)
GFR: 60.36 mL/min (ref >60.00)
Glucose, Bld: 104 mg/dL — ABNORMAL HIGH (ref 70–99)
Potassium: 5 mEq/L (ref 3.5–5.1)
Sodium: 142 mEq/L (ref 135–145)
Total Bilirubin: 0.5 mg/dL (ref 0.2–1.2)
Total Protein: 7 g/dL (ref 6.0–8.3)

## 2014-05-16 LAB — TSH: TSH: 2.47 u[IU]/mL (ref 0.35–4.50)

## 2014-05-16 LAB — VITAMIN D 25 HYDROXY (VIT D DEFICIENCY, FRACTURES): VITD: 53.04 ng/mL (ref 30.00–100.00)

## 2014-05-16 NOTE — Patient Instructions (Signed)
If your labs are all normal,  I will be ordering an MRI of your brain  This is  One version of a  "Low GI"  Diet:  It will still lower your blood sugars and allow you to lose 4 to 8  lbs  per month if you follow it carefully.  Your goal with exercise is a minimum of 30 minutes of aerobic exercise 5 days per week (Walking does not count once it becomes easy!)    All of the foods can be found at grocery stores and in bulk at [employer].  The Atkins protein bars and shakes are available in more varieties at [employer], [employer] and [place].     7 AM Breakfast:  Choose from the following:  Low carbohydrate Protein  Shakes (I recommend the EAS AdvantEdge "Carb Control" shakes, low carb shakes by Atkins, or the "Pure Protein" shakes)   Arnold's "Sandwhich Thin"toasted  w/ peanut butter (no jelly: about 20 net carbs  "Bagel Thin" with cream cheese and salmon: about 20 carbs   a scrambled egg/bacon/cheese burrito made with Mission's "carb balance" whole wheat tortilla  (about 10 net carbs )   Avoid cereal and bananas, oatmeal and cream of wheat and grits. They are loaded with carbohydrates!   10 AM: high protein snack  Protein bar by Atkins (the snack size, under 200 cal, usually < 6 net carbs).    A stick of cheese:  Around 1 carb,  100 cal     Dannon Light n Fit [place] Yogurt  (80 cal, 8 carbs)  Other so called "protein bars" and Greek yogurts tend to be loaded with carbohydrates.  Remember, in food advertising, the word "energy" is synonymous for " carbohydrate."  Lunch:   A Sandwich using the bread choices listed, Can use any  Eggs,  lunchmeat, grilled meat or canned tuna), avocado, regular mayo/mustard  and cheese.  A Salad using blue cheese, ranch,  Goddess or vinagrette,  No croutons or "confetti" and no "candied nuts" but regular nuts OK.   No pretzels or chips.  Pickles and miniature sweet peppers are a good low carb alternative that provide a "crunch"  The bread is the only source of  carbohydrate in a sandwich and  can be decreased by trying some of these alternatives to traditional loaf bread  Joseph's makes a pita bread and a flat bread that are 50 cal and 4 net carbs available at [place] and [place].  This can be toasted to use with hummous as well  Toufayan makes a low carb flatbread that's 100 cal and 9 net carbs available at [employer] and [employer]'s makes 2 sizes of  Low carb whole wheat tortilla  (The large one is 210 cal and 6 net carbs) Avoid "Low fat dressings, as well as Barry Brunner and [place] dressings They are loaded with sugar!   3 PM/ Mid day  Snack:  Consider  1 ounce of  almonds, walnuts, pistachios, pecans, peanuts,  Macadamia nuts or a nut medley.  Avoid "granola"; the dried cranberries and raisins are loaded with carbohydrates. Mixed nuts as long as there are no raisins,  cranberries or dried fruit.    Try the prosciutto/mozzarella cheese sticks by Fiorruci  In deli /backery section   High protein      6 PM  Dinner:     Meat/fowl/fish with a green salad, and either broccoli, cauliflower, green beans, spinach, brussel sprouts or  Lima beans. DO  NOT BREAD THE PROTEIN!!      There is a low carb pasta by Dreamfield's that is acceptable and tastes great: only 5 digestible carbs/serving.( All grocery stores but [employer]Js carry it )  Try Hurley Cisco Angelo's chicken piccata or chicken or eggplant parm over low carb pasta.(Lowes and BJs)   Marjory Lies Sanchez's "Carnitas" (pulled pork, no sauce,  0 carbs) or his beef pot roast to make a dinner burrito (at [employer]J's)  Pesto over low carb pasta (bj's sells a good quality pesto in the center refrigerated section of the deli   Try satueeing  Cheral Marker with mushroooms  Whole wheat pasta is still full of digestible carbs and  Not as low in glycemic index as Dreamfield's.   Brown rice is still rice,  So skip the rice and noodles if you eat [place] or [place] (or at least limit to 1/2 cup)  9 PM snack :   Breyer's "low carb" fudgsicle  or  ice cream bar (Carb Smart line), or  Weight Watcher's ice cream bar , or another "no sugar added" ice cream;  a serving of fresh berries/cherries with whipped cream   Cheese or DANNON'S LlGHT N FIT GREEK YOGURT  8 ounces of [place] unsweetened almond/cococunut milk    Avoid bananas, pineapple, grapes  and watermelon on a regular basis because they are high in sugar.  THINK OF THEM AS DESSERT  Remember that snack Substitutions should be less than 10 NET carbs per serving and meals < 20 carbs. Remember to subtract fiber grams to get the "net carbs."

## 2014-05-16 NOTE — Progress Notes (Signed)
Patient ID: Crystal Meyers, female   DOB: [birth year], 67 y.o.   MRN: [id number]  Patient Active Problem List   Diagnosis Date Noted  . Generalized anxiety disorder 05/18/2014  . Trigeminal neuralgia 10/27/2013  . Tick bite 02/19/2013  . Overweight (BMI 25.0-29.9) 08/28/2012  . Routine general medical examination at a health care facility 08/27/2012  . Barrett's esophagus 08/27/2012  . Sleep apnea, obstructive 10/14/2011  . 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 Tobacco abuse 07/27/2011  . Breast cancer 07/27/2011  . Tobacco abuse counseling 07/27/2011  . Depression 07/27/2011    Subjective:  CC:   Chief Complaint  Patient presents with  . Acute Visit    Emotional outburst in office laughing one minute and crying the next.    HPI:   Crystal Meyers is a 68 y.o. female who presents for evaluation of mood lability.  She is brought in by her daughter due to concerns about mood lability.  Daughter noticed a change two days ago ,  Started with development of nausea and bloating  after eating quesadillas.   But no diarrhea.  Then starting feeling depressed and became tearful suddenly after grieving over death of her mother who passed away 2 years ago, which is uncharacteristic for her . Wednesday night began crying uncontrollably for about 45 minutes . [name]  Has also been noting balance issues ,  Back pain since Tuesday  Having intermittent headaches brought on by crying.  Has been having  Irrational thoughts ,  Emotional lability.     Recent chest CT done to follow up on breast Ca showed two nodules on lungs last month, along with mild emphysema, and scarring on lungs.  Still smoking,  Can't quit.  Won't quit .  Dr Oliva Bustard. commented on her thyromegaly     Past Medical History  Diagnosis Date  . Depression   . Hypertension   . Hypercholesteremia   . Breast cancer 7/10    left , invasive lobular carcinoma  . Sleep apnea   . Hypothyroidism      Past Surgical History  Procedure Laterality Date  . Mastectomy  05/2009    bilateral, reconstructive surgery 09/2009  . Mastectomy      Bilateral        The following portions of the patient's history were reviewed and updated as appropriate: Allergies, current medications, and problem list.    Review of Systems:   Patient denies headache, fevers, malaise, unintentional weight loss, skin rash, eye pain, sinus congestion and sinus pain, sore throat, dysphagia,  hemoptysis , cough, dyspnea, wheezing, chest pain, palpitations, orthopnea, edema, abdominal pain, nausea, melena, diarrhea, constipation, flank pain, dysuria, hematuria, urinary  Frequency, nocturia, numbness, tingling, seizures,  Focal weakness, Loss of consciousness,  Tremor, insomnia, depression, anxiety, and suicidal ideation.     History   Social History  . Marital Status: Widowed    Spouse Name: N/A    Number of Children: N/A  . Years of Education: N/A   Occupational History  . Not on file.   Social History Main Topics  . Smoking status: Current Every Day Smoker    Types: Cigarettes  . Smokeless tobacco: Never Used  . Alcohol Use: No  . Drug Use: No  . Sexual Activity: Not on file   Other Topics Concern  . Not on file   Social History Narrative   Lives with mother.   Recently widowed.   Always uses seat belts   Has  a bird and a dog.   No exercise.    Objective:  Filed Vitals:   05/16/14 0904  BP: 140/90  Pulse: 83  Temp: 98.5 F (36.9 C)  Resp: 16     General appearance: alert, cooperative and appears stated age Ears: normal TM's and external ear canals both ears Throat: lips, mucosa, and tongue normal; teeth and gums normal Neck: no adenopathy, no carotid bruit, supple, symmetrical, trachea midline and thyroid not enlarged, symmetric, no tenderness/mass/nodules Back: symmetric, no curvature. ROM normal. No CVA tenderness. Lungs: clear to auscultation bilaterally Heart: regular  rate and rhythm, S1, S2 normal, no murmur, click, rub or gallop Abdomen: soft, non-tender; bowel sounds normal; no masses,  no organomegaly Pulses: 2+ and symmetric Skin: Skin color, texture, turgor normal. No rashes or lesions Lymph nodes: Cervical, supraclavicular, and axillary nodes normal.  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  Long discussion with her about her role as their grandmother.  checking for infection ,  Will consider MRI  If symptoms persist.   Lab Results  Component Value Date   TSH 2.47 05/16/2014   Lab Results  Component Value Date   NA 142 05/16/2014   K 5.0 05/16/2014   CL 104 05/16/2014   CO2 30 05/16/2014    A total of 25 minutes of face to face time was spent with patient more than half of which was spent in counselling and coordination of care   Updated Medication List Outpatient Encounter Prescriptions as of 05/16/2014  Medication Sig  . b complex vitamins tablet Take 1 tablet by mouth daily.  . Calcium Carbonate-Vitamin D (CALTRATE 600+D Meyers) Take by mouth daily.  . Eszopiclone (ESZOPICLONE) 3 MG TABS Take 1 tablet (3 mg total) by mouth at bedtime. Take immediately before bedtime  . letrozole (FEMARA) 2.5 MG tablet Take 2.5 mg by mouth daily.  [name] levothyroxine (SYNTHROID, LEVOTHROID) 75 MCG tablet TAKE 1 TABLET BY MOUTH EVERY DAY  . lisinopril (PRINIVIL,ZESTRIL) 40 MG tablet TAKE 0.5 TABLET BY MOUTH DAILY  . Omega-3 Fatty Acids (FISH OIL) 1200 MG CAPS Take 1 capsule by mouth daily.  [name] omeprazole (PRILOSEC) 40 MG capsule TAKE ONE CAPSULE BY MOUTH EVERY DAY  . simvastatin (ZOCOR) 20 MG tablet Take 1 tablet (20 mg total) by mouth at bedtime.  [name] venlafaxine XR (EFFEXOR-XR) 150 MG 24 hr capsule TAKE ONE CAPSULE BY MOUTH EVERY DAY  . [DISCONTINUED] lisinopril  (PRINIVIL,ZESTRIL) 40 MG tablet TAKE ONE TABLET BY MOUTH EVERY DAY  . [DISCONTINUED] omeprazole (PRILOSEC) 40 MG capsule TAKE 1 CAPSULE BY MOUTH EVERY DAY.  . [DISCONTINUED] venlafaxine XR (EFFEXOR-XR) 150 MG 24 hr capsule TAKE 1 CAPSULE BY MOUTH EVERY DAY  . [DISCONTINUED] venlafaxine XR (EFFEXOR-XR) 150 MG 24 hr capsule TAKE ONE CAPSULE BY MOUTH EVERY DAY  . [DISCONTINUED] venlafaxine XR (EFFEXOR-XR) 150 MG 24 hr capsule TAKE ONE CAPSULE BY MOUTH EVERY DAY  . carbamazepine (TEGRETOL) 200 MG tablet 1 tablet 2 times daily,  Increase to 2 tablets twice daily after one week if needed to control pain  . Red Yeast Rice 600 MG TABS Take by mouth daily.     Orders Placed This Encounter  Procedures  . CULTURE, URINE COMPREHENSIVE  . CBC with Differential  . Comprehensive metabolic panel  . TSH  . Lipid panel  .  Vit D  25 hydroxy (rtn osteoporosis monitoring)  . Urinalysis, Routine w reflex microscopic  . POCT urinalysis dipstick    Return in about 3 months (around 08/16/2014).

## 2014-05-16 NOTE — Progress Notes (Signed)
Pre-visit discussion using our clinic review tool. No additional management support is needed unless otherwise documented below in the visit note.  

## 2014-05-18 ENCOUNTER — Encounter: Payer: Self-pay | Admitting: Internal Medicine

## 2014-05-18 DIAGNOSIS — F411 Generalized anxiety disorder: Secondary | ICD-10-CM | POA: Insufficient documentation

## 2014-05-18 LAB — CULTURE, URINE COMPREHENSIVE
Colony Count: NO GROWTH
Organism ID, Bacteria: NO GROWTH

## 2014-05-18 NOTE — Assessment & Plan Note (Signed)
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  Long discussion with her about her role as their grandmother.  checking for infection ,  Will consider MRI  If symptoms persist.   Lab Results  Component Value Date   TSH 2.47 05/16/2014   Lab Results  Component Value Date   NA 142 05/16/2014   K 5.0 05/16/2014   CL 104 05/16/2014   CO2 30 05/16/2014

## 2014-07-01 ENCOUNTER — Encounter: Payer: Self-pay | Admitting: *Deleted

## 2014-07-08 ENCOUNTER — Other Ambulatory Visit: Payer: Self-pay | Admitting: Internal Medicine

## 2014-07-15 NOTE — Telephone Encounter (Signed)
Mailed unread message to pt  

## 2014-08-08 DIAGNOSIS — K2270 Barrett's esophagus without dysplasia: Secondary | ICD-10-CM | POA: Diagnosis not present

## 2014-08-08 DIAGNOSIS — K635 Polyp of colon: Secondary | ICD-10-CM | POA: Diagnosis not present

## 2014-09-11 ENCOUNTER — Ambulatory Visit: Payer: Self-pay | Admitting: Unknown Physician Specialty

## 2014-09-11 DIAGNOSIS — K635 Polyp of colon: Secondary | ICD-10-CM | POA: Diagnosis not present

## 2014-09-11 DIAGNOSIS — G4730 Sleep apnea, unspecified: Secondary | ICD-10-CM | POA: Diagnosis not present

## 2014-09-11 DIAGNOSIS — J45909 Unspecified asthma, uncomplicated: Secondary | ICD-10-CM | POA: Diagnosis not present

## 2014-09-11 DIAGNOSIS — Z86010 Personal history of colonic polyps: Secondary | ICD-10-CM | POA: Diagnosis not present

## 2014-09-11 DIAGNOSIS — K648 Other hemorrhoids: Secondary | ICD-10-CM | POA: Diagnosis not present

## 2014-09-11 DIAGNOSIS — K2270 Barrett's esophagus without dysplasia: Secondary | ICD-10-CM | POA: Diagnosis not present

## 2014-09-11 DIAGNOSIS — F418 Other specified anxiety disorders: Secondary | ICD-10-CM | POA: Diagnosis not present

## 2014-09-11 DIAGNOSIS — K640 First degree hemorrhoids: Secondary | ICD-10-CM | POA: Diagnosis not present

## 2014-09-11 DIAGNOSIS — K219 Gastro-esophageal reflux disease without esophagitis: Secondary | ICD-10-CM | POA: Diagnosis not present

## 2014-09-11 DIAGNOSIS — D123 Benign neoplasm of transverse colon: Secondary | ICD-10-CM | POA: Diagnosis not present

## 2014-09-11 DIAGNOSIS — Z1211 Encounter for screening for malignant neoplasm of colon: Secondary | ICD-10-CM | POA: Diagnosis not present

## 2014-09-11 DIAGNOSIS — J449 Chronic obstructive pulmonary disease, unspecified: Secondary | ICD-10-CM | POA: Diagnosis not present

## 2014-09-11 DIAGNOSIS — K2970 Gastritis, unspecified, without bleeding: Secondary | ICD-10-CM | POA: Diagnosis not present

## 2014-09-11 DIAGNOSIS — E039 Hypothyroidism, unspecified: Secondary | ICD-10-CM | POA: Diagnosis not present

## 2014-09-11 DIAGNOSIS — K2930 Chronic superficial gastritis without bleeding: Secondary | ICD-10-CM | POA: Diagnosis not present

## 2014-09-11 DIAGNOSIS — K210 Gastro-esophageal reflux disease with esophagitis: Secondary | ICD-10-CM | POA: Diagnosis not present

## 2014-09-11 DIAGNOSIS — Z800 Family history of malignant neoplasm of digestive organs: Secondary | ICD-10-CM | POA: Diagnosis not present

## 2014-09-11 DIAGNOSIS — I10 Essential (primary) hypertension: Secondary | ICD-10-CM | POA: Diagnosis not present

## 2014-09-11 LAB — HM COLONOSCOPY

## 2014-09-16 ENCOUNTER — Ambulatory Visit: Payer: Self-pay | Admitting: Oncology

## 2014-09-16 DIAGNOSIS — M899 Disorder of bone, unspecified: Secondary | ICD-10-CM | POA: Diagnosis not present

## 2014-09-16 DIAGNOSIS — Z79899 Other long term (current) drug therapy: Secondary | ICD-10-CM | POA: Diagnosis not present

## 2014-09-16 DIAGNOSIS — Z13820 Encounter for screening for osteoporosis: Secondary | ICD-10-CM | POA: Diagnosis not present

## 2014-09-16 LAB — HM DEXA SCAN: HM Dexa Scan: NORMAL

## 2014-09-23 ENCOUNTER — Ambulatory Visit
Admit: 2014-09-23 | Disposition: A | Discharge status: Home / Self Care | Payer: Self-pay | Attending: Oncology | Admitting: Oncology

## 2014-09-23 DIAGNOSIS — Z9013 Acquired absence of bilateral breasts and nipples: Secondary | ICD-10-CM | POA: Diagnosis not present

## 2014-09-23 DIAGNOSIS — Z720 Tobacco use: Secondary | ICD-10-CM | POA: Diagnosis not present

## 2014-09-23 DIAGNOSIS — J449 Chronic obstructive pulmonary disease, unspecified: Secondary | ICD-10-CM | POA: Diagnosis not present

## 2014-09-23 DIAGNOSIS — I10 Essential (primary) hypertension: Secondary | ICD-10-CM | POA: Diagnosis not present

## 2014-09-23 DIAGNOSIS — Z170 Estrogen receptor positive status [ER+]: Secondary | ICD-10-CM | POA: Diagnosis not present

## 2014-09-23 DIAGNOSIS — Z79811 Long term (current) use of aromatase inhibitors: Secondary | ICD-10-CM | POA: Diagnosis not present

## 2014-09-23 DIAGNOSIS — E039 Hypothyroidism, unspecified: Secondary | ICD-10-CM | POA: Diagnosis not present

## 2014-09-23 DIAGNOSIS — K219 Gastro-esophageal reflux disease without esophagitis: Secondary | ICD-10-CM | POA: Diagnosis not present

## 2014-09-23 DIAGNOSIS — C50412 Malignant neoplasm of upper-outer quadrant of left female breast: Secondary | ICD-10-CM | POA: Diagnosis not present

## 2014-09-23 DIAGNOSIS — Z79899 Other long term (current) drug therapy: Secondary | ICD-10-CM | POA: Diagnosis not present

## 2014-09-23 LAB — BASIC METABOLIC PANEL
BUN: 14 mg/dL (ref 4–21)
Creatinine: 1.1 mg/dL (ref <=1.1)
GLUCOSE: 113 mg/dL
Potassium: 4.2 mmol/L (ref 3.4–5.3)
Sodium: 136 mmol/L — AB (ref 137–147)

## 2014-09-23 LAB — CBC AND DIFFERENTIAL
HCT: 39 % (ref 36–46)
Hemoglobin: 13.2 g/dL (ref 12.0–16.0)
Platelets: 220 10*3/uL (ref 150–399)
WBC: 6.6 10^3/mL

## 2014-09-23 LAB — HEPATIC FUNCTION PANEL
ALT: 35 U/L (ref 7–35)
AST: 35 U/L (ref 13–35)
Alkaline Phosphatase: 41 U/L (ref 25–125)

## 2014-09-26 ENCOUNTER — Telehealth: Payer: Self-pay

## 2014-09-29 ENCOUNTER — Encounter: Payer: Self-pay | Admitting: Internal Medicine

## 2014-10-10 ENCOUNTER — Ambulatory Visit
Admit: 2014-10-10 | Disposition: A | Discharge status: Home / Self Care | Payer: Self-pay | Attending: Oncology | Admitting: Oncology

## 2014-10-15 ENCOUNTER — Ambulatory Visit (INDEPENDENT_AMBULATORY_CARE_PROVIDER_SITE_OTHER): Payer: Medicare Other | Admitting: Internal Medicine

## 2014-10-15 ENCOUNTER — Encounter: Payer: Self-pay | Admitting: Internal Medicine

## 2014-10-15 ENCOUNTER — Ambulatory Visit: Payer: Medicare Other | Admitting: Internal Medicine

## 2014-10-15 VITALS — BP 126/78 | HR 72 | Temp 98.1°F | Resp 14 | Ht 66.0 in | Wt 185.0 lb

## 2014-10-15 DIAGNOSIS — E038 Other specified hypothyroidism: Secondary | ICD-10-CM

## 2014-10-15 DIAGNOSIS — G500 Trigeminal neuralgia: Secondary | ICD-10-CM | POA: Diagnosis not present

## 2014-10-15 DIAGNOSIS — E785 Hyperlipidemia, unspecified: Secondary | ICD-10-CM

## 2014-10-15 DIAGNOSIS — R918 Other nonspecific abnormal finding of lung field: Secondary | ICD-10-CM | POA: Diagnosis not present

## 2014-10-15 DIAGNOSIS — E034 Atrophy of thyroid (acquired): Secondary | ICD-10-CM | POA: Diagnosis not present

## 2014-10-15 DIAGNOSIS — J439 Emphysema, unspecified: Secondary | ICD-10-CM | POA: Diagnosis not present

## 2014-10-15 DIAGNOSIS — E559 Vitamin D deficiency, unspecified: Secondary | ICD-10-CM | POA: Diagnosis not present

## 2014-10-15 DIAGNOSIS — Z720 Tobacco use: Secondary | ICD-10-CM | POA: Diagnosis not present

## 2014-10-15 DIAGNOSIS — F329 Major depressive disorder, single episode, unspecified: Secondary | ICD-10-CM

## 2014-10-15 DIAGNOSIS — E663 Overweight: Secondary | ICD-10-CM

## 2014-10-15 DIAGNOSIS — F32A Depression, unspecified: Secondary | ICD-10-CM

## 2014-10-15 LAB — LIPID PANEL
CHOL/HDL RATIO: 3
CHOLESTEROL: 183 mg/dL (ref 0–200)
HDL: 57.3 mg/dL (ref >39.00)
LDL Cholesterol: 99 mg/dL (ref 0–99)
NONHDL: 125.7
Triglycerides: 135 mg/dL (ref 0.0–149.0)
VLDL: 27 mg/dL (ref 0.0–40.0)

## 2014-10-15 LAB — TSH: TSH: 1.1 u[IU]/mL (ref 0.35–4.50)

## 2014-10-15 LAB — VITAMIN D 25 HYDROXY (VIT D DEFICIENCY, FRACTURES): VITD: 47.19 ng/mL (ref 30.00–100.00)

## 2014-10-15 MED ORDER — OMEPRAZOLE 40 MG PO CPDR: DELAYED_RELEASE_CAPSULE | ORAL | Status: DC

## 2014-10-15 MED ORDER — GABAPENTIN 300 MG PO CAPS: 600.0000 mg | ORAL_CAPSULE | Freq: Four times a day (QID) | ORAL | Status: DC

## 2014-10-15 MED ORDER — LISINOPRIL 40 MG PO TABS: ORAL_TABLET | ORAL | Status: DC

## 2014-10-15 MED ORDER — SIMVASTATIN 20 MG PO TABS: 20.0000 mg | ORAL_TABLET | Freq: Every day | ORAL | Status: DC

## 2014-10-15 MED ORDER — LEVOTHYROXINE SODIUM 75 MCG PO TABS: ORAL_TABLET | ORAL | Status: DC

## 2014-10-15 MED ORDER — VENLAFAXINE HCL ER 150 MG PO CP24: ORAL_CAPSULE | ORAL | Status: DC

## 2014-10-15 NOTE — Assessment & Plan Note (Signed)
found by Dr Oliva Bustard on chest CT. With annual surveillance planned

## 2014-10-15 NOTE — Assessment & Plan Note (Signed)
.  lungs are clear today and she is  asymptoamtic but sedentary,  Urged to start walking daily,  Urged to quit smoking.

## 2014-10-15 NOTE — Assessment & Plan Note (Addendum)
Managed with Effexor.  Patient appears much more calm today . Appetite and energy level is good, and she is sleeping well,  No changes to regimen.

## 2014-10-15 NOTE — Assessment & Plan Note (Signed)
She is tolerating simvastatin and hepatic function panel is normal.  Lipids today  Lab Results  Component Value Date   CHOL 188 05/16/2014   HDL 55.90 05/16/2014   LDLCALC 110* 05/16/2014   LDLDIRECT 131.8 08/27/2012   TRIG 109.0 05/16/2014   CHOLHDL 3 05/16/2014

## 2014-10-15 NOTE — Patient Instructions (Addendum)
You are doing well  Continue your current BP medication and increase for pressures > 140/90  I recommend getting the majority of your calcium and Vitamin D  through diet rather than supplements given the recent association of calcium supplements with increased coronary artery calcium scores  Silk Almond Light  original flavor:  Try it! One glass  Has 433 mg calcium   Please start walking daily for exercise

## 2014-10-15 NOTE — Progress Notes (Signed)
Patient ID: Crystal Meyers, female   DOB: Jun 23, 1946, 69 y.o.   MRN: [id number]   Patient Active Problem List   Diagnosis Date Noted  . Multiple pulmonary nodules 10/15/2014  . Hyperlipidemia LDL goal <100 10/15/2014  . Generalized anxiety disorder 05/18/2014  . Trigeminal neuralgia 10/27/2013  . Tick bite 02/19/2013  . Overweight (BMI 25.0-29.9) 08/28/2012  . Routine general medical examination at a health care facility 08/27/2012  . Barrett's esophagus 08/27/2012  . Sleep apnea, obstructive 10/14/2011  . 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 Tobacco abuse 07/27/2011  . Breast cancer 07/27/2011  . Tobacco abuse counseling 07/27/2011  . Depression 07/27/2011    Subjective:  CC:   Chief Complaint  Patient presents with  . Follow-up    medications    HPI:   Crystal Meyers is a 69 y.o. female who presents for  Follow up on anxiety,  Hypertension , hyperlipidemia, trigeminal neuralgia.  COPD and tobacco abuse.    Reviewed her living situation:  Still has 2 granchildren and their friends living with her , not paying rent, both girls are now pregnant with no intention of getting married.   Her grandson Doren Custard, whom he raised,  Is planning on moving to a trailer on her property in order to remain close by to "watch over her."  Her teenage granddaughter has "homeschooling herself" but falling behind and may not graduate this June but is pregnant,  With her boyfriend and her other pregnant friend all still living with her .  4 bedroom house.patient still doing their laundry and providing meals. States that her Energy level is good .  Fins herself getting irritated very easily,  Gets impatient with the "family" not doing things when she asks them to be done.  They are all workign ut living rent free, per patient's choice, so they can save $$$ .    Breast CA history: S/p double mastectomy,  On Femara x 5 years.    Dr. Oliva Bustard has advised to stay on it 5  more years.  The pulmonary nodules are being monitored,  Too small to biopsy.    Still smoking,  Despite the emphysema and  pulmonary nodules seen on last year's CT .  Has cut back to 1/2 pack daily   Noticing that her BP is becoming elevated in the afternoons but not above 130/85. 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  Advised to start walking   Trigeminal neuralgia,: Has seen Dr. Melrose Nakayama, [facility] who changed med from TEGRETOL TO GABAPENTIN. Patient has increasd the gabapentin to 600 mg qid for persistent pain.    Past Medical History  Diagnosis Date  . Depression   . Hypertension   . Hypercholesteremia   . Breast cancer 7/10    left , invasive lobular carcinoma  . Sleep apnea   . Hypothyroidism     Past Surgical History  Procedure Laterality Date  . Mastectomy  05/2009    bilateral, reconstructive surgery 09/2009  . Mastectomy      Bilateral   . Breast surgery Bilateral 2011    dbl mastectomy       The following portions of the patient's history were reviewed and updated as appropriate: Allergies, current medications, and problem list.    Review of Systems:   Patient denies headache, fevers, malaise, unintentional weight loss, skin rash, eye pain, sinus congestion and sinus  pain, sore throat, dysphagia,  hemoptysis , cough, dyspnea, wheezing, chest pain, palpitations, orthopnea, edema, abdominal pain, nausea, melena, diarrhea, constipation, flank pain, dysuria, hematuria, urinary  Frequency, nocturia, numbness, tingling, seizures,  Focal weakness, Loss of consciousness,  Tremor, insomnia, depression, anxiety, and suicidal ideation.     History   Social History  . Marital Status: Widowed    Spouse Name: N/A  . Number of Children: N/A  . Years of Education: N/A   Occupational History  . Not on file.   Social History Main Topics  . Smoking status: Current Every Day Smoker -- 0.50  packs/day    Types: Cigarettes  . Smokeless tobacco: Never Used  . Alcohol Use: No  . Drug Use: No  . Sexual Activity: No   Other Topics Concern  . Not on file   Social History Narrative   Lives with mother.   Recently widowed.   Always uses seat belts   Has a bird and a dog.   No exercise.    Objective:  Filed Vitals:   10/15/14 1204  BP: 126/78  Pulse: 72  Temp: 98.1 F (36.7 C)  Resp: 14     General appearance: alert, cooperative and appears stated age Ears: normal TM's and external ear canals both ears Throat: lips, mucosa, and tongue normal; teeth and gums normal Neck: no adenopathy, no carotid bruit, supple, symmetrical, trachea midline and thyroid not enlarged, symmetric, no tenderness/mass/nodules Back: symmetric, no curvature. ROM normal. No CVA tenderness. Lungs: clear to auscultation bilaterally Heart: regular rate and rhythm, S1, S2 normal, no murmur, click, rub or gallop Abdomen: soft, non-tender; bowel sounds normal; no masses,  no organomegaly Pulses: 2+ and symmetric Skin: Skin color, texture, turgor normal. No rashes or lesions Lymph nodes: Cervical, supraclavicular, and axillary nodes normal.  Assessment and Plan:  Trigeminal neuralgia Diagnosis confirmed by neurology evaluationl  Dr Melrose Nakayama changed tegretol to gabapentin and she has increased the dose to 2400 mg daily    COPD (chronic obstructive pulmonary disease) with emphysema .lungs are clear today and she is  asymptoamtic but sedentary,  Urged to start walking daily,  Urged to quit smoking.     Tobacco abuse She has no interest in quitting and plans to smoke until she dies, per discussion today.  She is a retired Tree surgeon , a breast ca survivor, with COPD and pulmonary nodules on CT.    Multiple pulmonary nodules found by Dr Oliva Bustard on chest CT. With annual surveillance planned    Depression Managed with Effexor.  Patient appears much more calm today . Appetite and energy level is  good, and she is sleeping well,  No changes to regimen.    Overweight (BMI 25.0-29.9) I have addressed  BMI and recommended wt loss of 10% of body weigh over the next 6 months using a low glycemic index diet and regular exercise a minimum of 5 days per week.     Hyperlipidemia LDL goal <100 She is tolerating simvastatin and hepatic function panel is normal.  Lipids today  Lab Results  Component Value Date   CHOL 188 05/16/2014   HDL 55.90 05/16/2014   LDLCALC 110* 05/16/2014   LDLDIRECT 131.8 08/27/2012   TRIG 109.0 05/16/2014   CHOLHDL 3 05/16/2014        Updated Medication List Outpatient Encounter Prescriptions as of 10/15/2014  Medication Sig  . b complex vitamins tablet Take 1 tablet by mouth daily.  . Calcium Carbonate-Vitamin D (CALTRATE 600+D PO) Take by mouth  daily.  . gabapentin (NEURONTIN) 300 MG capsule Take 2 capsules (600 mg total) by mouth QID.  [name] letrozole (FEMARA) 2.5 MG tablet Take 2.5 mg by mouth daily.  [name] levothyroxine (SYNTHROID, LEVOTHROID) 75 MCG tablet TAKE 1 TABLET BY MOUTH EVERY DAY  . lisinopril (PRINIVIL,ZESTRIL) 40 MG tablet TAKE ONE-HALF TABLET BY MOUTH EVERY DAY  . lisinopril (PRINIVIL,ZESTRIL) 40 MG tablet TAKE 0.5 TABLET BY MOUTH DAILY  . omeprazole (PRILOSEC) 40 MG capsule TAKE ONE CAPSULE BY MOUTH EVERY DAY  . simvastatin (ZOCOR) 20 MG tablet Take 1 tablet (20 mg total) by mouth at bedtime.  [name] venlafaxine XR (EFFEXOR-XR) 150 MG 24 hr capsule TAKE ONE CAPSULE BY MOUTH EVERY DAY  . [DISCONTINUED] carbamazepine (TEGRETOL) 200 MG tablet 1 tablet 2 times daily,  Increase to 2 tablets twice daily after one week if needed to control pain  . [DISCONTINUED] gabapentin (NEURONTIN) 300 MG capsule Take 600 mg by mouth QID.  . [DISCONTINUED] levothyroxine (SYNTHROID, LEVOTHROID) 75 MCG tablet TAKE 1 TABLET BY MOUTH EVERY DAY  . [DISCONTINUED] lisinopril (PRINIVIL,ZESTRIL) 40 MG tablet TAKE 0.5 TABLET BY MOUTH DAILY  . [DISCONTINUED] omeprazole (PRILOSEC)  40 MG capsule TAKE ONE CAPSULE BY MOUTH EVERY DAY  . [DISCONTINUED] simvastatin (ZOCOR) 20 MG tablet Take 1 tablet (20 mg total) by mouth at bedtime.  . [DISCONTINUED] venlafaxine XR (EFFEXOR-XR) 150 MG 24 hr capsule TAKE ONE CAPSULE BY MOUTH EVERY DAY  . Omega-3 Fatty Acids (FISH OIL) 1200 MG CAPS Take 1 capsule by mouth daily.  . [DISCONTINUED] Eszopiclone (ESZOPICLONE) 3 MG TABS Take 1 tablet (3 mg total) by mouth at bedtime. Take immediately before bedtime (Patient not taking: Reported on 10/15/2014)  . [DISCONTINUED] Red Yeast Rice 600 MG TABS Take by mouth daily.     Orders Placed This Encounter  Procedures  . Lipid panel  . TSH  . Vit D  25 hydroxy (rtn osteoporosis monitoring)    No Follow-up on file.

## 2014-10-15 NOTE — Assessment & Plan Note (Signed)
She has no interest in quitting and plans to smoke until she dies, per discussion today.  She is a retired Tree surgeon , a breast ca survivor, with COPD and pulmonary nodules on CT.

## 2014-10-15 NOTE — Assessment & Plan Note (Addendum)
Diagnosis confirmed by neurology evaluationl  Dr Melrose Nakayama changed tegretol to gabapentin and she has increased the dose to 2400 mg daily

## 2014-10-15 NOTE — Assessment & Plan Note (Signed)
I have addressed  BMI and recommended wt loss of 10% of body weigh over the next 6 months using a low glycemic index diet and regular exercise a minimum of 5 days per week.   

## 2014-10-17 ENCOUNTER — Encounter: Payer: Self-pay | Admitting: Internal Medicine

## 2014-10-17 NOTE — Telephone Encounter (Signed)
Phone call made to verify Flu shot for 2015-2016 season

## 2014-10-21 ENCOUNTER — Encounter: Payer: Self-pay | Admitting: Internal Medicine

## 2014-11-03 LAB — SURGICAL PATHOLOGY

## 2015-01-05 ENCOUNTER — Other Ambulatory Visit: Payer: Self-pay

## 2015-02-09 ENCOUNTER — Encounter: Payer: Self-pay | Admitting: Internal Medicine

## 2015-02-09 ENCOUNTER — Other Ambulatory Visit: Payer: Self-pay | Admitting: Internal Medicine

## 2015-02-09 ENCOUNTER — Ambulatory Visit (INDEPENDENT_AMBULATORY_CARE_PROVIDER_SITE_OTHER): Payer: Medicare Other | Admitting: Internal Medicine

## 2015-02-09 ENCOUNTER — Ambulatory Visit
Admission: RE | Admit: 2015-02-09 | Discharge: 2015-02-09 | Disposition: A | Discharge status: Home / Self Care | Payer: Medicare Other | Source: Ambulatory Visit | Attending: Internal Medicine | Admitting: Internal Medicine

## 2015-02-09 VITALS — BP 118/78 | HR 77 | Temp 98.2°F | Resp 12 | Ht 66.0 in | Wt 185.5 lb

## 2015-02-09 DIAGNOSIS — G500 Trigeminal neuralgia: Secondary | ICD-10-CM

## 2015-02-09 DIAGNOSIS — E663 Overweight: Secondary | ICD-10-CM

## 2015-02-09 DIAGNOSIS — I10 Essential (primary) hypertension: Secondary | ICD-10-CM

## 2015-02-09 DIAGNOSIS — M25551 Pain in right hip: Principal | ICD-10-CM

## 2015-02-09 DIAGNOSIS — E785 Hyperlipidemia, unspecified: Secondary | ICD-10-CM | POA: Diagnosis not present

## 2015-02-09 DIAGNOSIS — Z1159 Encounter for screening for other viral diseases: Secondary | ICD-10-CM | POA: Diagnosis not present

## 2015-02-09 DIAGNOSIS — R600 Localized edema: Secondary | ICD-10-CM

## 2015-02-09 DIAGNOSIS — E559 Vitamin D deficiency, unspecified: Secondary | ICD-10-CM

## 2015-02-09 DIAGNOSIS — G8929 Other chronic pain: Secondary | ICD-10-CM | POA: Insufficient documentation

## 2015-02-09 DIAGNOSIS — E038 Other specified hypothyroidism: Secondary | ICD-10-CM | POA: Diagnosis not present

## 2015-02-09 DIAGNOSIS — S51812A Laceration without foreign body of left forearm, initial encounter: Secondary | ICD-10-CM

## 2015-02-09 DIAGNOSIS — Z716 Tobacco abuse counseling: Secondary | ICD-10-CM

## 2015-02-09 DIAGNOSIS — F411 Generalized anxiety disorder: Secondary | ICD-10-CM

## 2015-02-09 DIAGNOSIS — Q742 Other congenital malformations of lower limb(s), including pelvic girdle: Secondary | ICD-10-CM

## 2015-02-09 DIAGNOSIS — E034 Atrophy of thyroid (acquired): Secondary | ICD-10-CM | POA: Diagnosis not present

## 2015-02-09 DIAGNOSIS — E538 Deficiency of other specified B group vitamins: Secondary | ICD-10-CM

## 2015-02-09 DIAGNOSIS — R7309 Other abnormal glucose: Secondary | ICD-10-CM

## 2015-02-09 DIAGNOSIS — R918 Other nonspecific abnormal finding of lung field: Secondary | ICD-10-CM

## 2015-02-09 DIAGNOSIS — R7303 Prediabetes: Secondary | ICD-10-CM

## 2015-02-09 DIAGNOSIS — S79911A Unspecified injury of right hip, initial encounter: Secondary | ICD-10-CM | POA: Diagnosis not present

## 2015-02-09 LAB — COMPREHENSIVE METABOLIC PANEL
ALT: 34 U/L (ref 0–35)
AST: 31 U/L (ref 0–37)
Albumin: 4.1 g/dL (ref 3.5–5.2)
Alkaline Phosphatase: 39 U/L (ref 39–117)
BILIRUBIN TOTAL: 0.2 mg/dL (ref 0.2–1.2)
BUN: 18 mg/dL (ref 6–23)
CALCIUM: 9.5 mg/dL (ref 8.4–10.5)
CHLORIDE: 105 meq/L (ref 96–112)
CO2: 24 meq/L (ref 19–32)
CREATININE: 0.91 mg/dL (ref 0.40–1.20)
GFR: 65.6 mL/min (ref >60.00)
GLUCOSE: 115 mg/dL — AB (ref 70–99)
Potassium: 4.3 mEq/L (ref 3.5–5.1)
Sodium: 140 mEq/L (ref 135–145)
TOTAL PROTEIN: 6.6 g/dL (ref 6.0–8.3)

## 2015-02-09 LAB — MICROALBUMIN / CREATININE URINE RATIO
CREATININE, U: 102.1 mg/dL
Microalb Creat Ratio: 0.7 mg/g (ref 0.0–30.0)
Microalb, Ur: <0.7 mg/dL (ref 0.0–1.9)

## 2015-02-09 LAB — CBC WITH DIFFERENTIAL/PLATELET
BASOS ABS: 0 10*3/uL (ref 0.0–0.1)
BASOS PCT: 0.7 % (ref 0.0–3.0)
Eosinophils Absolute: 0.2 10*3/uL (ref 0.0–0.7)
Eosinophils Relative: 2.5 % (ref 0.0–5.0)
HCT: 39.2 % (ref 36.0–46.0)
Hemoglobin: 13.3 g/dL (ref 12.0–15.0)
Lymphocytes Relative: 38.1 % (ref 12.0–46.0)
Lymphs Abs: 2.5 10*3/uL (ref 0.7–4.0)
MCHC: 34 g/dL (ref 30.0–36.0)
MCV: 92.8 fl (ref 78.0–100.0)
Monocytes Absolute: 0.5 10*3/uL (ref 0.1–1.0)
Monocytes Relative: 6.9 % (ref 3.0–12.0)
Neutro Abs: 3.4 10*3/uL (ref 1.4–7.7)
Neutrophils Relative %: 51.8 % (ref 43.0–77.0)
Platelets: 207 10*3/uL (ref 150.0–400.0)
RBC: 4.23 Mil/uL (ref 3.87–5.11)
RDW: 12.8 % (ref 11.5–15.5)
WBC: 6.5 10*3/uL (ref 4.0–10.5)

## 2015-02-09 LAB — LIPID PANEL
Cholesterol: 171 mg/dL (ref 0–200)
HDL: 52.2 mg/dL (ref >39.00)
LDL Cholesterol: 89 mg/dL (ref 0–99)
NonHDL: 118.7
Total CHOL/HDL Ratio: 3
Triglycerides: 149 mg/dL (ref 0.0–149.0)
VLDL: 29.8 mg/dL (ref 0.0–40.0)

## 2015-02-09 LAB — VITAMIN D 25 HYDROXY (VIT D DEFICIENCY, FRACTURES): VITD: 48.42 ng/mL (ref 30.00–100.00)

## 2015-02-09 LAB — HEMOGLOBIN A1C: HEMOGLOBIN A1C: 5.9 % (ref 4.6–6.5)

## 2015-02-09 LAB — TSH: TSH: 1.36 u[IU]/mL (ref 0.35–4.50)

## 2015-02-09 NOTE — Progress Notes (Signed)
Pre-visit discussion using our clinic review tool. No additional management support is needed unless otherwise documented below in the visit note.  

## 2015-02-09 NOTE — Progress Notes (Signed)
Subjective:  Patient ID: Crystal Meyers, female    DOB: Mar 22, 1946  Age: 69 y.o. MRN: [id number]  CC: The primary encounter diagnosis was Hip pain, chronic, right. Diagnoses of Essential hypertension, Hypothyroidism due to acquired atrophy of thyroid, Need for hepatitis C screening test, Prediabetes, Vitamin D deficiency, B12 deficiency, Hyperlipidemia, Trigeminal neuralgia, Tobacco abuse counseling, Overweight (BMI 25.0-29.9), Multiple pulmonary nodules, Right hip pain, Generalized anxiety disorder, Hyperlipidemia LDL goal <100, Facial edema, and Laceration of forearm without complication, left, initial encounter were also pertinent to this visit.  HPI Crystal Meyers presents for evaluation of multiple issues.  Last seen in April   1) subacute onset of low back pain radiating to right hip and buttock aggravated by standing and transitioning to standing.  Rates her pain as  10/10. ON most days she wakes up with it and gets worse as the day  progresses .  The pain localizes to the right posterolateral hip.  She first noticed it 2 months ago, has been increasing in severity over the past 2 months.  She also reports lower back pain but that is localized to the right sciatic notch . For no clear reason her pain is less intense today .  She has no history of falls. Her Left lateral thigh feels numb ,  Has not tried taking any medications for it,  despite the pain being so severe that is has limited her activities with regard to housework and yardwork.    2) tobacco abuse:  She is Still smoking.   3) Obesity:  She has  gained 10 lbs since November.  Not exercising or following a calorie restricted diet.  4) Bilateral puffiness under both eyes.  She does wear CPAP for  OSA and but often goes to bed at 2 am  averages 6 hours per sleep, often disrupted    5) Left forearm laceration .    Outpatient Prescriptions Prior to Visit  Medication Sig Dispense Refill  . Calcium Carbonate-Vitamin D (CALTRATE  600+D Meyers) Take by mouth daily.    [name] gabapentin (NEURONTIN) 300 MG capsule Take 2 capsules (600 mg total) by mouth QID. 240 capsule 5  . letrozole (FEMARA) 2.5 MG tablet Take 2.5 mg by mouth daily.    [name] levothyroxine (SYNTHROID, LEVOTHROID) 75 MCG tablet TAKE 1 TABLET BY MOUTH EVERY DAY 90 tablet 1  . lisinopril (PRINIVIL,ZESTRIL) 40 MG tablet TAKE ONE-HALF TABLET BY MOUTH EVERY DAY 45 tablet 4  . lisinopril (PRINIVIL,ZESTRIL) 40 MG tablet TAKE 0.5 TABLET BY MOUTH DAILY 45 tablet 1  . omeprazole (PRILOSEC) 40 MG capsule TAKE ONE CAPSULE BY MOUTH EVERY DAY 90 capsule 1  . simvastatin (ZOCOR) 20 MG tablet Take 1 tablet (20 mg total) by mouth at bedtime. 90 tablet 1  . venlafaxine XR (EFFEXOR-XR) 150 MG 24 hr capsule TAKE ONE CAPSULE BY MOUTH EVERY DAY 90 capsule 1  . b complex vitamins tablet Take 1 tablet by mouth daily.    . Omega-3 Fatty Acids (FISH OIL) 1200 MG CAPS Take 1 capsule by mouth daily.     No facility-administered medications prior to visit.    Review of Systems;  Patient denies headache, fevers, malaise, unintentional weight loss, skin rash, eye pain, sinus congestion and sinus pain, sore throat, dysphagia,  hemoptysis , cough, dyspnea, wheezing, chest pain, palpitations, orthopnea, edema, abdominal pain, nausea, melena, diarrhea, constipation, flank pain, dysuria, hematuria, urinary  Frequency, nocturia, numbness, tingling, seizures,  Focal weakness, Loss of consciousness,  Tremor, insomnia, depression,  anxiety, and suicidal ideation.      Objective:  BP 118/78 mmHg  Pulse 77  Temp(Src) 98.2 F (36.8 C) (Oral)  Resp 12  Ht 5\' 6"  (1.676 m)  Wt 185 lb 8 oz (84.142 kg)  BMI 29.95 kg/m2  SpO2 95%  BP Readings from Last 3 Encounters:  02/09/15 118/78  10/15/14 126/78  05/16/14 140/90    Wt Readings from Last 3 Encounters:  02/09/15 185 lb 8 oz (84.142 kg)  10/15/14 185 lb (83.915 kg)  05/16/14 175 lb 8 oz (79.606 kg)    General appearance: alert, cooperative and  appears stated age Ears: normal TM's and external ear canals both ears Throat: lips, mucosa, and tongue normal; teeth and gums normal Neck: no adenopathy, no carotid bruit, supple, symmetrical, trachea midline and thyroid not enlarged, symmetric, no tenderness/mass/nodules Back: symmetric, no curvature. ROM normal. No CVA tenderness. Lungs: clear to auscultation bilaterally Heart: regular rate and rhythm, S1, S2 normal, no murmur, click, rub or gallop Abdomen: soft, non-tender; bowel sounds normal; no masses,  no organomegaly Pulses: 2+ and symmetric Skin: Skin color, texture, turgor normal. No rashes or lesions Lymph nodes: Cervical, supraclavicular, and axillary nodes normal.  Lab Results  Component Value Date   HGBA1C 5.9 02/09/2015   HGBA1C 5.7 10/25/2013   HGBA1C 6.0 08/27/2012    Lab Results  Component Value Date   CREATININE 0.91 02/09/2015   CREATININE 1.1 09/23/2014   CREATININE 1.0 05/16/2014    Lab Results  Component Value Date   WBC 6.5 02/09/2015   HGB 13.3 02/09/2015   HCT 39.2 02/09/2015   PLT 207.0 02/09/2015   GLUCOSE 115* 02/09/2015   CHOL 171 02/09/2015   TRIG 149.0 02/09/2015   HDL 52.20 02/09/2015   LDLDIRECT 131.8 08/27/2012   LDLCALC 89 02/09/2015   ALT 34 02/09/2015   AST 31 02/09/2015   NA 140 02/09/2015   K 4.3 02/09/2015   CL 105 02/09/2015   CREATININE 0.91 02/09/2015   BUN 18 02/09/2015   CO2 24 02/09/2015   TSH 1.36 02/09/2015   HGBA1C 5.9 02/09/2015   MICROALBUR <0.7 02/09/2015    No results found.  Assessment & Plan:   Problem List Items Addressed This Visit      Unprioritized   Tobacco abuse counseling    Smoking cessation instruction/counseling given:  counseled patient on the dangers of tobacco use, advised patient to stop smoking, and reviewed strategies to maximize success      Overweight (BMI 25.0-29.9)    I have addressed  BMI and recommended wt loss of 10% of body weigh over the next 6 months using a low glycemic  index diet and regular exercise a minimum of 5 days per week.   Body mass index is 29.95 kg/(m^2).         Trigeminal neuralgia    Persistent ,  Managed with gabapentin. Dose changed to 900 mg tid. Instead of 600 mg qid.       Generalized anxiety disorder    Managed with Effexor . No changes today       Multiple pulmonary nodules    found by Dr Oliva Bustard on chest CT with annual surveillance planned         Hyperlipidemia LDL goal <100    Managed with simvastatin. LDL and triglycerides are at goal on current medications. He has no side effects and liver enzymes are normal. No changes today   Lab Results  Component Value Date   CHOL 171 02/09/2015  HDL 52.20 02/09/2015   LDLCALC 89 02/09/2015   LDLDIRECT 131.8 08/27/2012   TRIG 149.0 02/09/2015   CHOLHDL 3 02/09/2015   Lab Results  Component Value Date   ALT 34 02/09/2015   AST 31 02/09/2015   ALKPHOS 39 02/09/2015   BILITOT 0.2 02/09/2015         Right hip pain    Palin films were negative for AVN and severe DJD but noted a sclerotic bonr lesion in the femur.  Given her history of breast CA,  MIR has been ordered.       Hypothyroidism    Thyroid function is WNL on current dose.  No current changes needed.   Lab Results  Component Value Date   TSH 1.36 02/09/2015         Relevant Orders   TSH (Completed)   Facial edema    Her thyroid function is normal .  Advised her that hee issue was cosmetic , not medical, and would improve with smoking cessation.       Laceration of forearm without complication    Laceration is notable for black eschar , suggesting inadequate moisture control due to uncovered status. 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, sports        Other Visit Diagnoses    Hip pain, chronic, right    -  Primary    Relevant Orders    DG HIP UNILAT WITH PELVIS 2-3 VIEWS RIGHT (Completed)    Essential hypertension        Relevant Orders    Microalbumin / creatinine urine ratio  (Completed)    Need for hepatitis C screening test        Relevant Orders    Hepatitis C antibody (Completed)    Prediabetes        Relevant Orders    Comprehensive metabolic panel (Completed)    Hemoglobin A1c (Completed)    Vitamin D deficiency        Relevant Orders    Vit D  25 hydroxy (rtn osteoporosis monitoring) (Completed)    B12 deficiency        Relevant Orders    CBC with Differential/Platelet (Completed)    Hyperlipidemia        Relevant Orders    Lipid panel (Completed)      A total of 40 minutes was spent with patient more than half of which was spent in counseling patient on the above mentioned issues , and coordination of care. I am having Ms. Wiltsey maintain her letrozole, Calcium Carbonate-Vitamin D (CALTRATE 600+D Meyers), b complex vitamins, Fish Oil, lisinopril, gabapentin, levothyroxine, lisinopril, omeprazole, simvastatin, and venlafaxine XR.  No orders of the defined types were placed in this encounter.    There are no discontinued medications.  Follow-up: No Follow-up on file.   [name], MD

## 2015-02-09 NOTE — Patient Instructions (Addendum)
Plain hip and pelvis films today at [facility] outpatient   You need more sleep at the appropriate times (12 MN to 8 am)   PLEASE  QUIT SMOKING. [name]    KEEP YOUR WOUND COVERED UNTIL IT HEALS.

## 2015-02-10 ENCOUNTER — Encounter: Payer: Self-pay | Admitting: Internal Medicine

## 2015-02-10 DIAGNOSIS — S51819A Laceration without foreign body of unspecified forearm, initial encounter: Secondary | ICD-10-CM | POA: Insufficient documentation

## 2015-02-10 DIAGNOSIS — E039 Hypothyroidism, unspecified: Secondary | ICD-10-CM | POA: Insufficient documentation

## 2015-02-10 DIAGNOSIS — R600 Localized edema: Secondary | ICD-10-CM | POA: Insufficient documentation

## 2015-02-10 DIAGNOSIS — M25551 Pain in right hip: Secondary | ICD-10-CM | POA: Insufficient documentation

## 2015-02-10 LAB — HEPATITIS C ANTIBODY: HCV Ab: NEGATIVE

## 2015-02-10 NOTE — Assessment & Plan Note (Signed)
I have addressed  BMI and recommended wt loss of 10% of body weigh over the next 6 months using a low glycemic index diet and regular exercise a minimum of 5 days per week.   Body mass index is 29.95 kg/(m^2).

## 2015-02-10 NOTE — Assessment & Plan Note (Signed)
Thyroid function is WNL on current dose.  No current changes needed.   Lab Results  Component Value Date   TSH 1.36 02/09/2015

## 2015-02-10 NOTE — Assessment & Plan Note (Signed)
Managed with simvastatin. LDL and triglycerides are at goal on current medications. He has no side effects and liver enzymes are normal. No changes today   Lab Results  Component Value Date   CHOL 171 02/09/2015   HDL 52.20 02/09/2015   LDLCALC 89 02/09/2015   LDLDIRECT 131.8 08/27/2012   TRIG 149.0 02/09/2015   CHOLHDL 3 02/09/2015   Lab Results  Component Value Date   ALT 34 02/09/2015   AST 31 02/09/2015   ALKPHOS 39 02/09/2015   BILITOT 0.2 02/09/2015

## 2015-02-10 NOTE — Assessment & Plan Note (Signed)
Managed with Effexor . No changes today

## 2015-02-10 NOTE — Assessment & Plan Note (Signed)
Palin films were negative for AVN and severe DJD but noted a sclerotic bonr lesion in the femur.  Given her history of breast CA,  MIR has been ordered.

## 2015-02-10 NOTE — Assessment & Plan Note (Signed)
Persistent ,  Managed with gabapentin. Dose changed to 900 mg tid. Instead of 600 mg qid.

## 2015-02-10 NOTE — Assessment & Plan Note (Addendum)
Laceration is notable for black eschar , suggesting inadequate moisture control due to uncovered status. 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

## 2015-02-10 NOTE — Assessment & Plan Note (Signed)
found by Dr Oliva Bustard on chest CT with annual surveillance planned

## 2015-02-10 NOTE — Assessment & Plan Note (Signed)
Her thyroid function is normal .  Advised her that hee issue was cosmetic , not medical, and would improve with smoking cessation.

## 2015-02-10 NOTE — Assessment & Plan Note (Signed)
Smoking cessation instruction/counseling given:  counseled patient on the dangers of tobacco use, advised patient to stop smoking, and reviewed strategies to maximize success 

## 2015-02-12 ENCOUNTER — Encounter: Payer: Self-pay | Admitting: Internal Medicine

## 2015-02-16 ENCOUNTER — Ambulatory Visit
Admission: RE | Admit: 2015-02-16 | Discharge: 2015-02-16 | Disposition: A | Discharge status: Home / Self Care | Payer: Medicare Other | Source: Ambulatory Visit | Attending: Internal Medicine | Admitting: Internal Medicine

## 2015-02-16 DIAGNOSIS — M25461 Effusion, right knee: Secondary | ICD-10-CM | POA: Diagnosis not present

## 2015-02-16 DIAGNOSIS — Z853 Personal history of malignant neoplasm of breast: Secondary | ICD-10-CM | POA: Insufficient documentation

## 2015-02-16 DIAGNOSIS — M25462 Effusion, left knee: Secondary | ICD-10-CM | POA: Diagnosis not present

## 2015-02-16 DIAGNOSIS — M25551 Pain in right hip: Secondary | ICD-10-CM | POA: Diagnosis not present

## 2015-02-16 DIAGNOSIS — G8929 Other chronic pain: Secondary | ICD-10-CM | POA: Diagnosis not present

## 2015-02-16 MED ORDER — GADOBENATE DIMEGLUMINE 529 MG/ML IV SOLN
20.0000 mL | Freq: Once | INTRAVENOUS | Status: AC | PRN
  Administered 2015-02-16: 17 mL via INTRAVENOUS

## 2015-02-20 ENCOUNTER — Encounter: Payer: Self-pay | Admitting: Internal Medicine

## 2015-02-20 DIAGNOSIS — M25561 Pain in right knee: Secondary | ICD-10-CM

## 2015-02-20 DIAGNOSIS — M25552 Pain in left hip: Secondary | ICD-10-CM

## 2015-03-02 DIAGNOSIS — M25562 Pain in left knee: Secondary | ICD-10-CM | POA: Diagnosis not present

## 2015-03-02 DIAGNOSIS — M25462 Effusion, left knee: Secondary | ICD-10-CM | POA: Diagnosis not present

## 2015-03-04 IMAGING — CT CT CHEST W/O CM SCREENING
1 of 4 series · 10 of 40 positions shown, 13 images · non-contrast
Comparison: No priors.

CLINICAL DATA: 67-year-old female current smoker with 70 pack-year
history of smoking. Lung cancer screening examination.

EXAM:
CT CHEST WITHOUT CONTRAST
TECHNIQUE: Multidetector CT imaging of the chest was performed following the
standard protocol without IV contrast..

[ct lung segmentation data · axial · 0.70mm/px · z∈[-634,-634]mm · 10 of 311 frames shown]
[frame 1/311  mediastinal]
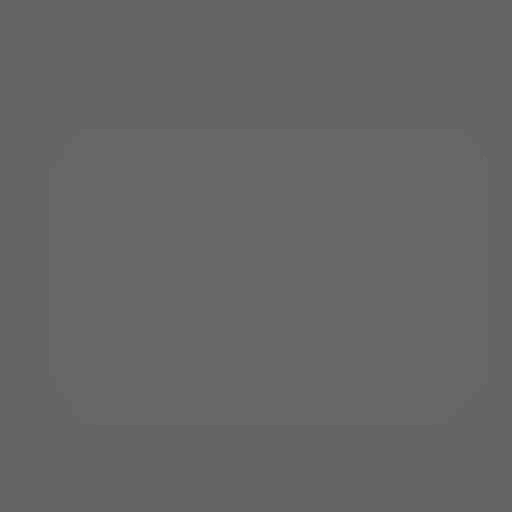
[frame 1/311  lung]
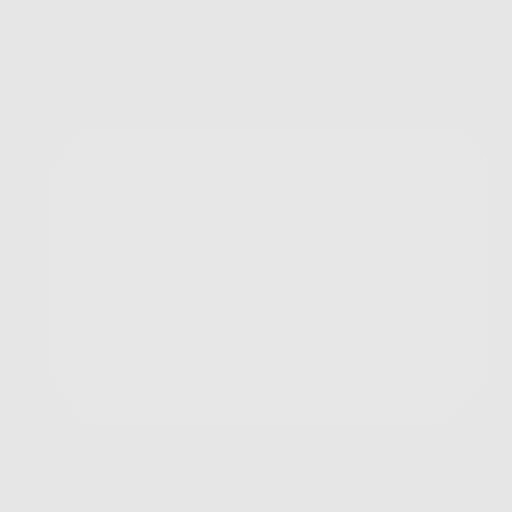
[frame 35/311  lung]
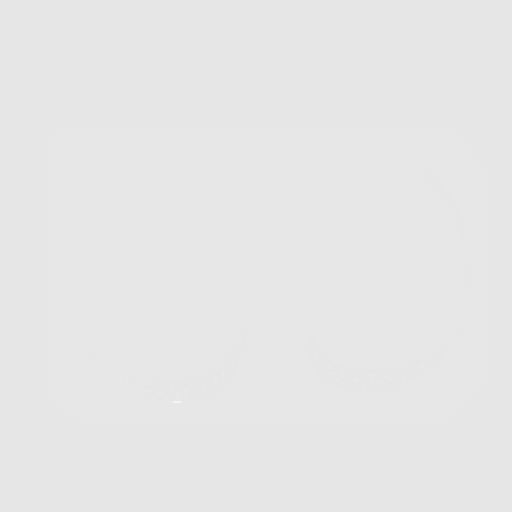
[frame 69/311  lung]
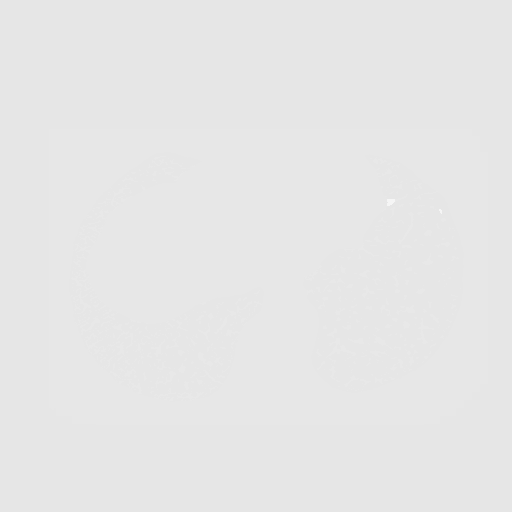
[frame 104/311  lung]
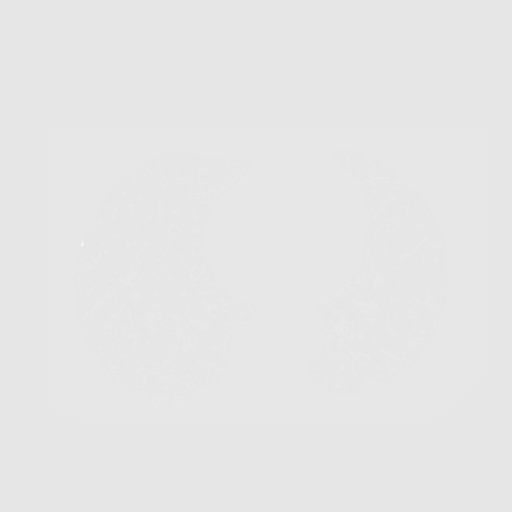
[frame 138/311  mediastinal]
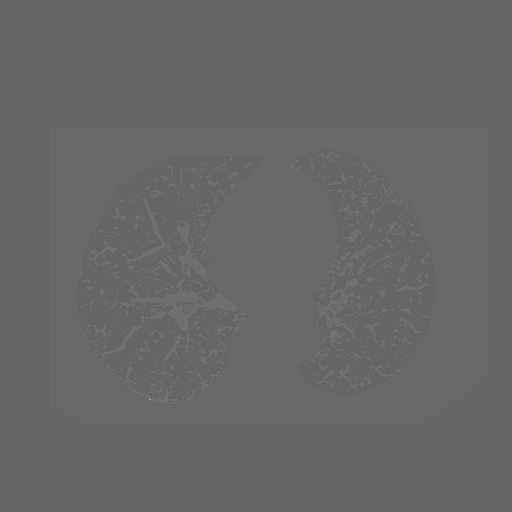
[frame 138/311  lung]
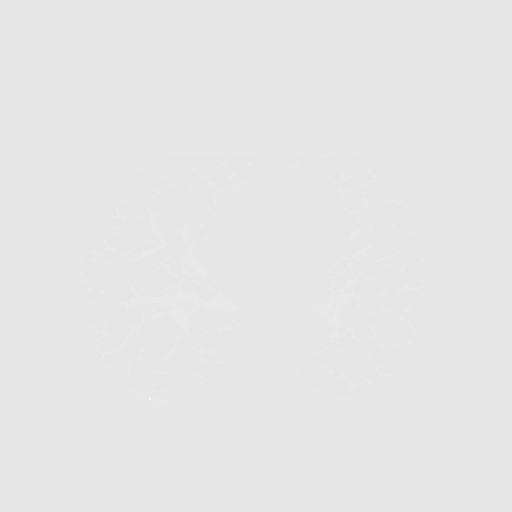
[frame 173/311  lung]
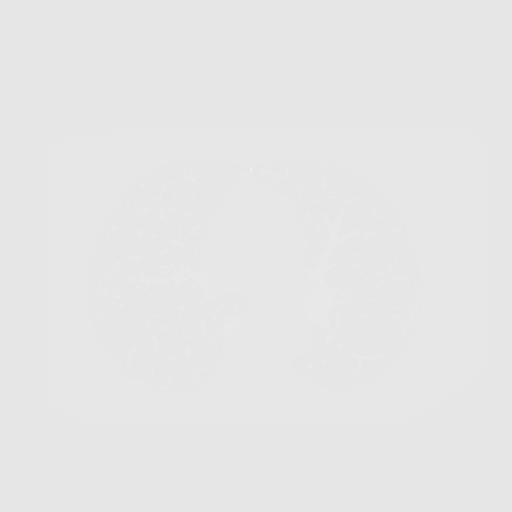
[frame 207/311  lung]
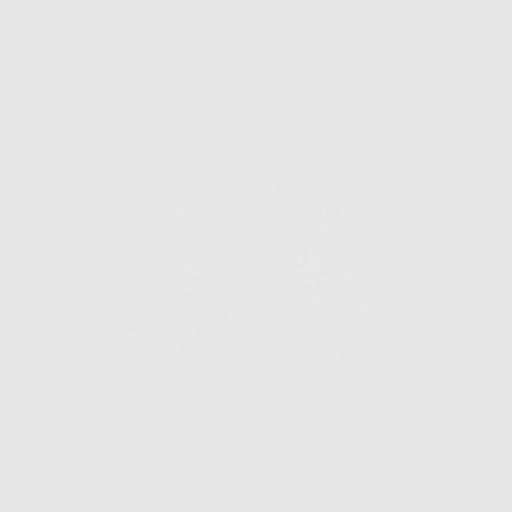
[frame 242/311  lung]
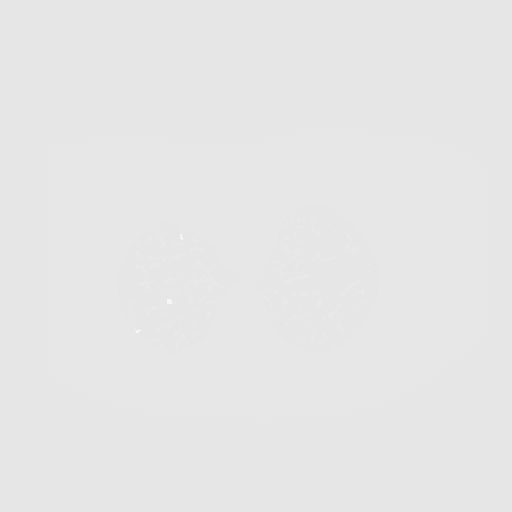
[frame 276/311  mediastinal]
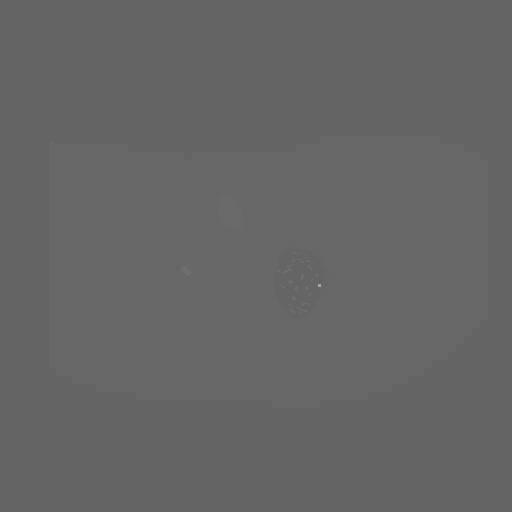
[frame 276/311  lung]
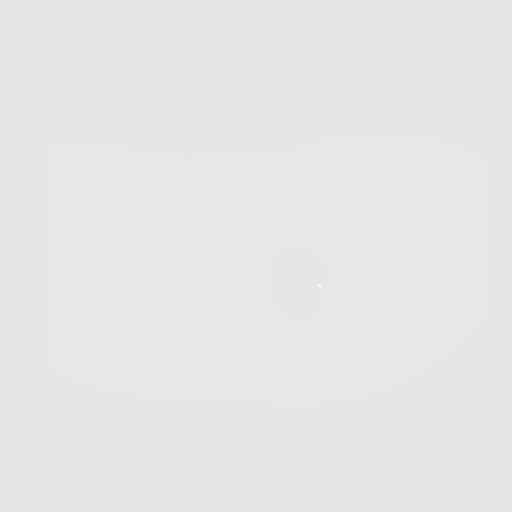
[frame 311/311  lung]
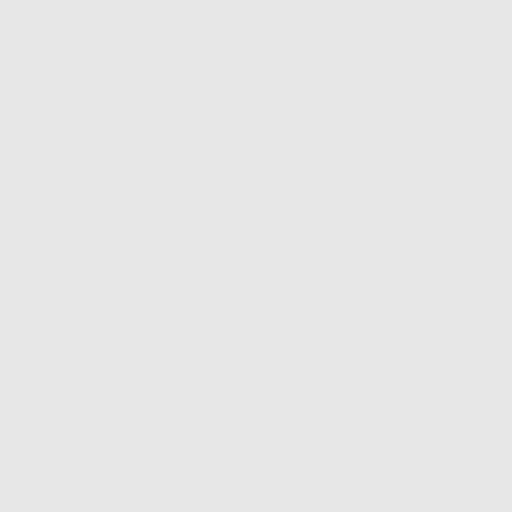

[10 of 40 positions shown; findings below may reference images not displayed]

FINDINGS: Mediastinum: Heart size is normal. There is no significant
pericardial fluid, thickening or pericardial calcification. There is
atherosclerosis of the thoracic aorta, the great vessels of the
mediastinum and the coronary arteries, including calcified
atherosclerotic plaque in the left main and left anterior descending
coronary arteries. No pathologically enlarged mediastinal or hilar
lymph nodes. Please note that accurate exclusion of hilar adenopathy
is limited on noncontrast CT scans. Esophagus is unremarkable in
appearance.

Lungs/Pleura: 2 mm nodule in the lingula (image 195 of series 3). 2
mm subpleural nodule in the posterior aspect of the left upper lobe
near the apex (image 83 of series 3). No other more suspicious
appearing pulmonary nodules or masses are otherwise noted.
Thin-walled cyst in the right lower lobe incidentally noted.
Extensive bilateral apical pleuroparenchymal thickening, somewhat
nodular in appearance, most compatible with chronic post infectious
or inflammatory scarring. No acute consolidative airspace disease.
No pleural effusions. Mild diffuse bronchial wall thickening. Very
mild paraseptal emphysema is noted, predominantly in the upper
lungs.

Upper Abdomen: Unremarkable.

Musculoskeletal: Bilateral breast implants incidentally noted. There
are no aggressive appearing lytic or blastic lesions noted in the
visualized portions of the skeleton.
IMPRESSION: 1. Lung-RADS Category 2, benign appearance or behavior. Continue
annual screening with low-dose chest CT without contrast in 12
months.
2. Mild diffuse bronchial wall thickening with mild paraseptal
emphysema; imaging findings suggestive of underlying COPD.
3. Atherosclerosis, including left main and left anterior descending
coronary artery disease. Please note that although the presence of
coronary artery calcium documents the presence of coronary artery
disease, the severity of this disease and any potential stenosis
cannot be assessed on this non-gated CT examination. Assessment for
potential risk factor modification, dietary therapy or pharmacologic
therapy may be warranted, if clinically indicated.

## 2015-03-20 ENCOUNTER — Other Ambulatory Visit: Payer: Self-pay | Admitting: *Deleted

## 2015-03-20 DIAGNOSIS — C50919 Malignant neoplasm of unspecified site of unspecified female breast: Secondary | ICD-10-CM

## 2015-03-24 DIAGNOSIS — B359 Dermatophytosis, unspecified: Secondary | ICD-10-CM | POA: Diagnosis not present

## 2015-03-24 DIAGNOSIS — L858 Other specified epidermal thickening: Secondary | ICD-10-CM | POA: Diagnosis not present

## 2015-03-24 DIAGNOSIS — Z85828 Personal history of other malignant neoplasm of skin: Secondary | ICD-10-CM | POA: Diagnosis not present

## 2015-03-24 DIAGNOSIS — D485 Neoplasm of uncertain behavior of skin: Secondary | ICD-10-CM | POA: Diagnosis not present

## 2015-03-24 DIAGNOSIS — D1800 Hemangioma unspecified site: Secondary | ICD-10-CM | POA: Diagnosis not present

## 2015-03-24 DIAGNOSIS — L578 Other skin changes due to chronic exposure to nonionizing radiation: Secondary | ICD-10-CM | POA: Diagnosis not present

## 2015-03-24 DIAGNOSIS — Z1283 Encounter for screening for malignant neoplasm of skin: Secondary | ICD-10-CM | POA: Diagnosis not present

## 2015-03-24 DIAGNOSIS — L821 Other seborrheic keratosis: Secondary | ICD-10-CM | POA: Diagnosis not present

## 2015-03-24 DIAGNOSIS — L820 Inflamed seborrheic keratosis: Secondary | ICD-10-CM | POA: Diagnosis not present

## 2015-03-24 DIAGNOSIS — D229 Melanocytic nevi, unspecified: Secondary | ICD-10-CM | POA: Diagnosis not present

## 2015-03-24 DIAGNOSIS — D692 Other nonthrombocytopenic purpura: Secondary | ICD-10-CM | POA: Diagnosis not present

## 2015-03-26 ENCOUNTER — Inpatient Hospital Stay: Payer: Medicare Other | Admitting: Oncology

## 2015-03-26 ENCOUNTER — Inpatient Hospital Stay: Payer: Medicare Other

## 2015-04-02 ENCOUNTER — Inpatient Hospital Stay (HOSPITAL_BASED_OUTPATIENT_CLINIC_OR_DEPARTMENT_OTHER): Payer: Medicare Other | Admitting: Oncology

## 2015-04-02 ENCOUNTER — Inpatient Hospital Stay: Payer: Medicare Other | Attending: Oncology

## 2015-04-02 VITALS — BP 153/94 | HR 73 | Temp 95.7°F | Resp 18 | Wt 181.9 lb

## 2015-04-02 DIAGNOSIS — F329 Major depressive disorder, single episode, unspecified: Secondary | ICD-10-CM | POA: Diagnosis not present

## 2015-04-02 DIAGNOSIS — E039 Hypothyroidism, unspecified: Secondary | ICD-10-CM | POA: Insufficient documentation

## 2015-04-02 DIAGNOSIS — Z170 Estrogen receptor positive status [ER+]: Secondary | ICD-10-CM | POA: Diagnosis not present

## 2015-04-02 DIAGNOSIS — F17210 Nicotine dependence, cigarettes, uncomplicated: Secondary | ICD-10-CM

## 2015-04-02 DIAGNOSIS — Z9013 Acquired absence of bilateral breasts and nipples: Secondary | ICD-10-CM

## 2015-04-02 DIAGNOSIS — Z79899 Other long term (current) drug therapy: Secondary | ICD-10-CM | POA: Insufficient documentation

## 2015-04-02 DIAGNOSIS — E780 Pure hypercholesterolemia: Secondary | ICD-10-CM | POA: Diagnosis not present

## 2015-04-02 DIAGNOSIS — I10 Essential (primary) hypertension: Secondary | ICD-10-CM | POA: Diagnosis not present

## 2015-04-02 DIAGNOSIS — Z853 Personal history of malignant neoplasm of breast: Secondary | ICD-10-CM

## 2015-04-02 DIAGNOSIS — C50919 Malignant neoplasm of unspecified site of unspecified female breast: Secondary | ICD-10-CM

## 2015-04-02 DIAGNOSIS — K219 Gastro-esophageal reflux disease without esophagitis: Secondary | ICD-10-CM | POA: Diagnosis not present

## 2015-04-02 LAB — COMPREHENSIVE METABOLIC PANEL
ALT: 34 U/L (ref 14–54)
AST: 35 U/L (ref 15–41)
Albumin: 4.4 g/dL (ref 3.5–5.0)
Alkaline Phosphatase: 46 U/L (ref 38–126)
Anion gap: 7 (ref 5–15)
BILIRUBIN TOTAL: 0.5 mg/dL (ref 0.3–1.2)
BUN: 18 mg/dL (ref 6–20)
CO2: 29 mmol/L (ref 22–32)
CREATININE: 0.99 mg/dL (ref 0.44–1.00)
Calcium: 8.9 mg/dL (ref 8.9–10.3)
Chloride: 102 mmol/L (ref 101–111)
GFR, EST NON AFRICAN AMERICAN: 58 mL/min — AB (ref >60)
Glucose, Bld: 100 mg/dL — ABNORMAL HIGH (ref 65–99)
Potassium: 4.4 mmol/L (ref 3.5–5.1)
Sodium: 138 mmol/L (ref 135–145)
TOTAL PROTEIN: 7.4 g/dL (ref 6.5–8.1)

## 2015-04-02 LAB — CBC WITH DIFFERENTIAL/PLATELET
BASOS ABS: 0 10*3/uL (ref 0–0.1)
Basophils Relative: 1 %
Eosinophils Absolute: 0.1 10*3/uL (ref 0–0.7)
Eosinophils Relative: 2 %
HEMATOCRIT: 39.6 % (ref 35.0–47.0)
Hemoglobin: 13.6 g/dL (ref 12.0–16.0)
LYMPHS ABS: 2.7 10*3/uL (ref 1.0–3.6)
LYMPHS PCT: 42 %
MCH: 30.9 pg (ref 26.0–34.0)
MCHC: 34.3 g/dL (ref 32.0–36.0)
MCV: 90.1 fL (ref 80.0–100.0)
MONO ABS: 0.5 10*3/uL (ref 0.2–0.9)
Monocytes Relative: 7 %
NEUTROS ABS: 3 10*3/uL (ref 1.4–6.5)
Neutrophils Relative %: 48 %
Platelets: 211 10*3/uL (ref 150–440)
RBC: 4.39 MIL/uL (ref 3.80–5.20)
RDW: 12.6 % (ref 11.5–14.5)
WBC: 6.4 10*3/uL (ref 3.6–11.0)

## 2015-04-05 ENCOUNTER — Encounter: Payer: Self-pay | Admitting: Oncology

## 2015-04-05 DIAGNOSIS — K219 Gastro-esophageal reflux disease without esophagitis: Secondary | ICD-10-CM | POA: Insufficient documentation

## 2015-04-05 DIAGNOSIS — J449 Chronic obstructive pulmonary disease, unspecified: Secondary | ICD-10-CM | POA: Insufficient documentation

## 2015-04-05 DIAGNOSIS — K635 Polyp of colon: Secondary | ICD-10-CM | POA: Insufficient documentation

## 2015-04-05 DIAGNOSIS — M1990 Unspecified osteoarthritis, unspecified site: Secondary | ICD-10-CM | POA: Insufficient documentation

## 2015-04-05 DIAGNOSIS — J45909 Unspecified asthma, uncomplicated: Secondary | ICD-10-CM | POA: Insufficient documentation

## 2015-04-05 NOTE — Progress Notes (Signed)
[place] @ [facility] [phone]  [phone]     Crystal Meyers OB: [birth year]  MR#: [id number]  XQJ#:[id number]  Patient Care Team: [name], MD as PCP - General (Internal Medicine)  CHIEF COMPLAINT:  Chief Complaint/Diagnosis:   1. 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 Low Oncotype DX score. Diagnosed in June 2010. 2. Status post-bilateral mastectomy and reconstructive surgery. 3. Started Femara approx. 2011.    INTERVAL HISTORY:  69 year old lady came today further follow-up regarding a stage I C carcinoma of breast.  Appetite has improved.  Patient continues to do very depressed.  Continues to smoke.  Taking letrozole.  Occasional chest wall pain and joint pains taking calcium and vitamin D.  Getting regular mammograms done patient is approximately now finished 5 years of letrozole REVIEW OF SYSTEMS:    general status: Patient is feeling weak and tired.  No change in a performance status.  No chills.  No fever. HEENT: Alopecia.  No evidence of stomatitis Lungs: No cough or shortness of breath chest wall pain Cardiac: No chest pain or paroxysmal nocturnal dyspnea GI: No nausea no vomiting no diarrhea no abdominal pain Skin: No rash Lower extremity no swelling Neurological system: No tingling.  No numbness.  No other focal signs Musculoskeletal system joint pains In August patient had pelvic pain and plain x-ray done revealed questionable sclerotic lesion in the left femur patient underwent MRI scan and there was no evidence of sclerotic lesion or lytic lesions suggestive of metastatic gastric disease all those x-rays have been reviewed independently Depression Continues to smoke   As per HPI. Otherwise, a complete review of systems is negatve.  PAST MEDICAL HISTORY: Past Medical History  Diagnosis Date  . Depression   . Hypertension   .  Hypercholesteremia   . Breast cancer 7/10    left , invasive lobular carcinoma  . Sleep apnea   . Hypothyroidism     PAST SURGICAL HISTORY: Past Surgical History  Procedure Laterality Date  . Mastectomy  05/2009    bilateral, reconstructive surgery 09/2009  . Mastectomy      Bilateral   . Breast surgery Bilateral 2011    dbl mastectomy    FAMILY HISTORY Family History  Problem Relation Age of Onset  . Mental illness Mother   . Diabetes Mother   . Hypertension Mother    Significant History/PMH:   Cancer, Breast: with bilateral mastectomy   bilateral mastec:    GERD:    Restless leg syndrome:    Sleep Apnea:    Hypothyroid:    Depression:    COPD:    Hypertension:    breast biopsy:   Preventive Screening:  Has patient had any of the following test? Mammography   Last Mammography: March 2014   Smoking History: Smoking History Packs per day.  PFSH: Family History: positive  Comments: Aunt From father's side had breast cancer in her 52.  Father had colon cancer.  History of pancreatic cancer  Social History: positive tobacco  Smoker: Refused smoking cessation counseling  Smoker- How Many Years: Many years,Still smoking  Additional Past Medical and Surgical History: Sleep apnea   Depression  Sleep disturbance,    Gastroesophageal reflux  Hypothyroidism   Hypertension    ADVANCED DIRECTIVES:  Patient does not have any living will or healthcare power of attorney.  Information was given .  Available resources had been discussed.  We  will follow-up on subsequent appointments regarding this issue  HEALTH MAINTENANCE: Social History  Substance Use Topics  . Smoking status: Current Every Day Smoker -- 0.50 packs/day    Types: Cigarettes  . Smokeless tobacco: Never Used  . Alcohol Use: No      No Known Allergies  Current Outpatient Prescriptions  Medication Sig Dispense Refill  . b complex vitamins tablet Take 1 tablet by mouth daily.    .  Calcium Carbonate-Vitamin D (CALTRATE 600+D PO) Take by mouth daily.    [name] gabapentin (NEURONTIN) 300 MG capsule Take 2 capsules (600 mg total) by mouth QID. 240 capsule 5  . letrozole (FEMARA) 2.5 MG tablet Take 2.5 mg by mouth daily.    [name] levothyroxine (SYNTHROID, LEVOTHROID) 75 MCG tablet TAKE 1 TABLET BY MOUTH EVERY DAY 90 tablet 1  . lisinopril (PRINIVIL,ZESTRIL) 40 MG tablet TAKE 0.5 TABLET BY MOUTH DAILY 45 tablet 1  . Omega-3 Fatty Acids (FISH OIL) 1200 MG CAPS Take 1 capsule by mouth daily.    [name] omeprazole (PRILOSEC) 40 MG capsule TAKE ONE CAPSULE BY MOUTH EVERY DAY 90 capsule 1  . simvastatin (ZOCOR) 20 MG tablet Take 1 tablet (20 mg total) by mouth at bedtime. 90 tablet 1  . venlafaxine XR (EFFEXOR-XR) 150 MG 24 hr capsule TAKE ONE CAPSULE BY MOUTH EVERY DAY 90 capsule 1   No current facility-administered medications for this visit.    OBJECTIVE:  Filed Vitals:   04/02/15 1216  BP: 153/94  Pulse: 73  Temp: 95.7 F (35.4 C)  Resp: 18     Body mass index is 29.37 kg/(m^2).    ECOG FS:1 - Symptomatic but completely ambulatory  PHYSICAL EXAM: General  status: Performance status is good.  Patient has not lost significant weight HEENT: No evidence of stomatitis. Sclera and conjunctivae :: No jaundice.   pale looking. Lungs: Air  entry equal on both sides.  No rhonchi.  No rales.  Cardiac: Heart sounds are normal.  No pericardial rub.  No murmur. Lymphatic system: Cervical, axillary, inguinal, lymph nodes not palpable GI: Abdomen is soft.  No ascites.  Liver spleen not palpable.  No tenderness.  Bowel sounds are within normal limit Lower extremity: No edema Neurological system: Higher functions, cranial nerves intact no evidence of peripheral neuropathy. Skin: No rash.  No ecchymosis.. Examination of breast Left breast previous lumpectomy radiation therapy no evidence of recurrent disease right breast free of masses Patient had bilateral reconstructive surgery   LAB  RESULTS:  CBC Latest Ref Rng 04/02/2015 02/09/2015  WBC 3.6 - 11.0 K/uL 6.4 6.5  Hemoglobin 12.0 - 16.0 g/dL 13.6 13.3  Hematocrit 35.0 - 47.0 % 39.6 39.2  Platelets 150 - 440 K/uL 211 207.0    Appointment on 04/02/2015  Component Date Value Ref Range Status  . WBC 04/02/2015 6.4  3.6 - 11.0 K/uL Final  . RBC 04/02/2015 4.39  3.80 - 5.20 MIL/uL Final  . Hemoglobin 04/02/2015 13.6  12.0 - 16.0 g/dL Final  . HCT 04/02/2015 39.6  35.0 - 47.0 % Final  . MCV 04/02/2015 90.1  80.0 - 100.0 fL Final  . MCH 04/02/2015 30.9  26.0 - 34.0 pg Final  . MCHC 04/02/2015 34.3  32.0 - 36.0 g/dL Final  . RDW 04/02/2015 12.6  11.5 - 14.5 % Final  . Platelets 04/02/2015 211  150 - 440 K/uL Final  . Neutrophils Relative % 04/02/2015 48   Final  . Neutro Abs 04/02/2015 3.0  1.4 - 6.5  K/uL Final  . Lymphocytes Relative 04/02/2015 42   Final  . Lymphs Abs 04/02/2015 2.7  1.0 - 3.6 K/uL Final  . Monocytes Relative 04/02/2015 7   Final  . Monocytes Absolute 04/02/2015 0.5  0.2 - 0.9 K/uL Final  . Eosinophils Relative 04/02/2015 2   Final  . Eosinophils Absolute 04/02/2015 0.1  0 - 0.7 K/uL Final  . Basophils Relative 04/02/2015 1   Final  . Basophils Absolute 04/02/2015 0.0  0 - 0.1 K/uL Final  . Sodium 04/02/2015 138  135 - 145 mmol/L Final  . Potassium 04/02/2015 4.4  3.5 - 5.1 mmol/L Final  . Chloride 04/02/2015 102  101 - 111 mmol/L Final  . CO2 04/02/2015 29  22 - 32 mmol/L Final  . Glucose, Bld 04/02/2015 100* 65 - 99 mg/dL Final  . BUN 04/02/2015 18  6 - 20 mg/dL Final  . Creatinine, Ser 04/02/2015 0.99  0.44 - 1.00 mg/dL Final  . Calcium 04/02/2015 8.9  8.9 - 10.3 mg/dL Final  . Total Protein 04/02/2015 7.4  6.5 - 8.1 g/dL Final  . Albumin 04/02/2015 4.4  3.5 - 5.0 g/dL Final  . AST 04/02/2015 35  15 - 41 U/L Final  . ALT 04/02/2015 34  14 - 54 U/L Final  . Alkaline Phosphatase 04/02/2015 46  38 - 126 U/L Final  . Total Bilirubin 04/02/2015 0.5  0.3 - 1.2 mg/dL Final  . GFR calc non Af Amer  04/02/2015 58* >60 mL/min Final  . GFR calc Af Amer 04/02/2015 >60  >60 mL/min Final   Comment: (NOTE) The eGFR has been calculated using the CKD EPI equation. This calculation has not been validated in all clinical situations. eGFR's persistently <60 mL/min signify possible Chronic Kidney Disease.   . 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 All imaged musculature is intact. Left knee joint effusion is incompletely visualized. There is no evidence of bursitis. Imaged musculature is intact. The right hip is unremarkable.  IMPRESSION: Negative for metastatic disease. If concern for metastatic disease persists, whole-body bone scan in 4-6 months is recommended.  Left knee joint effusion.   Electronically Signed  By: Inge Rise M.D.  On: 02/17/2015 08:53 No results found.  ASSESSMENT: Carcinoma of left breast status post bilateral mastectomy reconstructive surgery on from an eye present time on clinical evaluation there is no evidence of recurrent disease  Recently he had a pelvic x-ray done which has been reviewed there was questionable sclerotic lesion in left femur MRI scan of left femur did not reveal any sclerotic metastases or lytic area this has been reviewed independently Patient had a bilateral reconstructive surgery sono mammogram has been recommended unless clinical examination shows any abnormality  Patient is a chronic smoker.  Does not want to quit smoking.  Had a screening CT scan done last year and no evidence of any mass was found Continue follow-up without any intervention    Patient expressed understanding and was in agreement with this plan. She also understands that She can call clinic at any time with any questions, concerns, or complaints.    No matching staging information was found for the patient.  Forest Gleason, MD   04/05/2015 6:41 PM

## 2015-04-09 DIAGNOSIS — C50919 Malignant neoplasm of unspecified site of unspecified female breast: Secondary | ICD-10-CM | POA: Diagnosis not present

## 2015-04-17 ENCOUNTER — Encounter: Payer: Self-pay | Admitting: Oncology

## 2015-04-19 ENCOUNTER — Other Ambulatory Visit: Payer: Self-pay | Admitting: Internal Medicine

## 2015-04-21 ENCOUNTER — Telehealth: Payer: Self-pay | Admitting: *Deleted

## 2015-04-21 NOTE — Telephone Encounter (Signed)
Notified patient that annual lung cancer screening low dose CT scan is due. Confirmed that patient is within the age range of 55-77, and asymptomatic, (no signs or symptoms of lung cancer). The patient is a current smoker, with a 71 pack year history. The shared decision making visit was done 04/17/14 Patient is agreeable for CT scan being scheduled.

## 2015-04-27 ENCOUNTER — Other Ambulatory Visit: Payer: Self-pay | Admitting: Internal Medicine

## 2015-05-06 ENCOUNTER — Other Ambulatory Visit: Payer: Self-pay | Admitting: Family Medicine

## 2015-05-06 ENCOUNTER — Encounter: Payer: Self-pay | Admitting: Family Medicine

## 2015-05-06 DIAGNOSIS — Z87891 Personal history of nicotine dependence: Secondary | ICD-10-CM | POA: Insufficient documentation

## 2015-05-06 HISTORY — DX: Personal history of nicotine dependence: Z87.891

## 2015-05-11 ENCOUNTER — Ambulatory Visit
Admission: RE | Admit: 2015-05-11 | Discharge: 2015-05-11 | Disposition: A | Discharge status: Home / Self Care | Payer: Medicare Other | Source: Ambulatory Visit | Attending: Family Medicine | Admitting: Family Medicine

## 2015-05-11 DIAGNOSIS — K760 Fatty (change of) liver, not elsewhere classified: Secondary | ICD-10-CM | POA: Diagnosis not present

## 2015-05-11 DIAGNOSIS — I2510 Atherosclerotic heart disease of native coronary artery without angina pectoris: Secondary | ICD-10-CM | POA: Insufficient documentation

## 2015-05-11 DIAGNOSIS — Z87891 Personal history of nicotine dependence: Secondary | ICD-10-CM | POA: Insufficient documentation

## 2015-05-18 ENCOUNTER — Telehealth: Payer: Self-pay | Admitting: *Deleted

## 2015-05-18 ENCOUNTER — Encounter: Payer: Self-pay | Admitting: *Deleted

## 2015-05-18 NOTE — Telephone Encounter (Signed)
Left message to call back on home number. Tried cell phone (mailbox full). Sent mychart message also.

## 2015-05-18 NOTE — Telephone Encounter (Signed)
Notified patient of LDCT lung cancer screening results of Lung Rads 2 finding with recommendation for 12 month follow up imaging. Also notified of incidental finding noted below. Patient verbalizes understanding.   IMPRESSION: 1. Lung-RADS Category 2, benign appearance or behavior. Continue annual screening with low-dose chest CT without contrast in 12 months. 2. Age advanced coronary artery atherosclerosis. Recommend assessment of coronary risk factors and consideration of medical therapy. 3. Hepatic steatosis.

## 2015-05-18 NOTE — Telephone Encounter (Signed)
Please ask pateint to schedule appt to discuss the non pulmonary findings on her recent CT Scan (fatty liver, and CAD)

## 2015-05-20 ENCOUNTER — Telehealth: Payer: Self-pay | Admitting: Internal Medicine

## 2015-05-20 NOTE — Telephone Encounter (Signed)
unread mychart message mailed to patient 

## 2015-05-20 NOTE — Telephone Encounter (Signed)
Pt daughter called about her mom needing to be seen for having chest pains. Daughter states she is not having a heart attack but when she had a CT scan for her lungs that she gets every year. It shows plague in the arteries. Pt does not want to see another provider. I scheduled pt for 11/30 @4pm  daughter wanted to see if a sooner appt can be scheduled. Let me know. Thank You!

## 2015-05-20 NOTE — Telephone Encounter (Signed)
4:30  Tuesday April15

## 2015-05-21 NOTE — Telephone Encounter (Signed)
Spoke with provider who is ok with patient waiting till 06/10/2015 at 4:30. I have informed Leslie(daugher) of appt.

## 2015-06-10 ENCOUNTER — Encounter: Payer: Self-pay | Admitting: Internal Medicine

## 2015-06-10 ENCOUNTER — Ambulatory Visit (INDEPENDENT_AMBULATORY_CARE_PROVIDER_SITE_OTHER): Payer: Medicare Other | Admitting: Internal Medicine

## 2015-06-10 VITALS — BP 140/60 | HR 88 | Temp 98.0°F | Wt 187.0 lb

## 2015-06-10 DIAGNOSIS — R419 Unspecified symptoms and signs involving cognitive functions and awareness: Secondary | ICD-10-CM

## 2015-06-10 DIAGNOSIS — R0789 Other chest pain: Secondary | ICD-10-CM

## 2015-06-10 DIAGNOSIS — R4189 Other symptoms and signs involving cognitive functions and awareness: Secondary | ICD-10-CM

## 2015-06-10 DIAGNOSIS — Z716 Tobacco abuse counseling: Secondary | ICD-10-CM | POA: Diagnosis not present

## 2015-06-10 DIAGNOSIS — R609 Edema, unspecified: Secondary | ICD-10-CM | POA: Diagnosis not present

## 2015-06-10 DIAGNOSIS — I10 Essential (primary) hypertension: Secondary | ICD-10-CM

## 2015-06-10 DIAGNOSIS — R079 Chest pain, unspecified: Secondary | ICD-10-CM | POA: Diagnosis not present

## 2015-06-10 DIAGNOSIS — E785 Hyperlipidemia, unspecified: Secondary | ICD-10-CM

## 2015-06-10 MED ORDER — ROSUVASTATIN CALCIUM 20 MG PO TABS: 20.0000 mg | ORAL_TABLET | Freq: Every day | ORAL | Status: DC

## 2015-06-10 MED ORDER — FUROSEMIDE 20 MG PO TABS: 20.0000 mg | ORAL_TABLET | Freq: Every day | ORAL | Status: DC

## 2015-06-10 NOTE — Progress Notes (Signed)
Subjective:  Patient ID: Crystal Meyers, female    DOB: Jun 09, 1946  Age: 69 y.o. MRN: [id number]  CC: The primary encounter diagnosis was Chest pain, unspecified chest pain type. Diagnoses of Cognitive deficits, Edema, unspecified type, Tobacco abuse counseling, Chest pain, atypical, Hyperlipidemia LDL goal <100, Essential hypertension, and Cognitive complaints were also pertinent to this visit.  HPI Crystal Meyers presents for chest pain .  Felt like an electric shock over her left chest wall .  Has been intermittent ,asting for only a second,  Then resolve. None  In the past 3 weeks  Not associated with movement of arm but more associated with 'rushing around'   CRFS include tobacco abuse, hypertension and hyperlipidemia. All managed .  Smoking 1/2 pack or more daily.   2) Having a hard time dealing with technical matters. Some forgetfulness  3)  Bilateral edema L> R  Pitting.  Has been present since summer.  Has severe sleep apnea  cpap set at 51 daughter has noted the mask is leaking     atherosclerosis of coronary arteries and thoracic aorta ,   was noted  on recent lung CT   Outpatient Prescriptions Prior to Visit  Medication Sig Dispense Refill  . b complex vitamins tablet Take 1 tablet by mouth daily.    . Calcium Carbonate-Vitamin D (CALTRATE 600+D Meyers) Take by mouth daily.    [name] letrozole (FEMARA) 2.5 MG tablet Take 2.5 mg by mouth daily.    [name] levothyroxine (SYNTHROID, LEVOTHROID) 75 MCG tablet TAKE 1 TABLET BY MOUTH EVERY DAY 90 tablet 0  . lisinopril (PRINIVIL,ZESTRIL) 40 MG tablet TAKE 0.5 TABLET BY MOUTH DAILY 45 tablet 1  . omeprazole (PRILOSEC) 40 MG capsule TAKE ONE CAPSULE BY MOUTH EVERY DAY 90 capsule 1  . venlafaxine XR (EFFEXOR-XR) 150 MG 24 hr capsule TAKE ONE CAPSULE BY MOUTH EVERY DAY 90 capsule 1  . gabapentin (NEURONTIN) 300 MG capsule Take 2 capsules (600 mg total) by mouth QID. 240 capsule 5  . simvastatin (ZOCOR) 20 MG tablet TAKE 1 TABLET(20 MG) BY MOUTH AT  BEDTIME 90 tablet 0  . Omega-3 Fatty Acids (FISH OIL) 1200 MG CAPS Take 1 capsule by mouth daily.     No facility-administered medications prior to visit.    Review of Systems;  Patient denies headache, fevers, malaise, unintentional weight loss, skin rash, eye pain, sinus congestion and sinus pain, sore throat, dysphagia,  hemoptysis , cough, dyspnea, wheezing, chest pain, palpitations, orthopnea, edema, abdominal pain, nausea, melena, diarrhea, constipation, flank pain, dysuria, hematuria, urinary  Frequency, nocturia, numbness, tingling, seizures,  Focal weakness, Loss of consciousness,  Tremor, insomnia, depression, anxiety, and suicidal ideation.      Objective:  BP 140/60 mmHg  Pulse 88  Temp(Src) 98 F (36.7 C) (Oral)  Wt 187 lb (84.823 kg)  SpO2 96%  BP Readings from Last 3 Encounters:  06/10/15 140/60  04/02/15 153/94  02/09/15 118/78    Wt Readings from Last 3 Encounters:  06/10/15 187 lb (84.823 kg)  05/11/15 183 lb (83.008 kg)  04/02/15 181 lb 14.1 oz (82.5 kg)    General appearance: alert, cooperative and appears stated age Ears: normal TM's and external ear canals both ears Throat: lips, mucosa, and tongue normal; teeth and gums normal Neck: no adenopathy, no carotid bruit, supple, symmetrical, trachea midline and thyroid not enlarged, symmetric, no tenderness/mass/nodules Back: symmetric, no curvature. ROM normal. No CVA tenderness. Lungs: clear to auscultation bilaterally Heart: regular rate and rhythm, S1,  S2 normal, no murmur, click, rub or gallop Abdomen: soft, non-tender; bowel sounds normal; no masses,  no organomegaly Pulses: 2+ and symmetric Skin: Skin color, texture, turgor normal. No rashes or lesions Lymph nodes: Cervical, supraclavicular, and axillary nodes normal.  Lab Results  Component Value Date   HGBA1C 5.9 02/09/2015   HGBA1C 5.7 10/25/2013   HGBA1C 6.0 08/27/2012    Lab Results  Component Value Date   CREATININE 0.96 06/11/2015    CREATININE 0.99 04/02/2015   CREATININE 0.91 02/09/2015    Lab Results  Component Value Date   WBC 6.4 04/02/2015   HGB 13.6 04/02/2015   HCT 39.6 04/02/2015   PLT 211 04/02/2015   GLUCOSE 105* 06/11/2015   CHOL 171 02/09/2015   TRIG 149.0 02/09/2015   HDL 52.20 02/09/2015   LDLDIRECT 97.0 06/11/2015   LDLCALC 89 02/09/2015   ALT 27 06/11/2015   AST 24 06/11/2015   NA 142 06/11/2015   K 4.5 06/11/2015   CL 106 06/11/2015   CREATININE 0.96 06/11/2015   BUN 14 06/11/2015   CO2 28 06/11/2015   TSH 0.56 06/11/2015   HGBA1C 5.9 02/09/2015   MICROALBUR <0.7 02/09/2015    Ct Chest Lung Ca Screen Low Dose W/o Cm  05/11/2015  CLINICAL DATA:  Lung cancer screening. Left breast cancer with bilateral mastectomy 6 years ago. Smoker. EXAM: CT CHEST WITHOUT CONTRAST TECHNIQUE: Multidetector CT imaging of the chest was performed following the standard protocol without IV contrast. COMPARISON:  04/17/2014 FINDINGS: Mediastinum/Nodes: Bilateral breast implants. No axillary adenopathy. Aortic and branch vessel atherosclerosis. Normal heart size, without pericardial effusion. Lad coronary artery atherosclerosis on image/series 33/2. No mediastinal or definite hilar adenopathy, given limitations of unenhanced CT. Lungs/Pleura: No pleural fluid. Mild centrilobular emphysema. Posterior left upper lobe 2 mm pulmonary nodule is unchanged. A superior segment left lower lobe 2 mm nodule, including on image/series 130/13 com is new. A lingular subpleural nodule is calcified, consistent with a granuloma. Upper abdomen: Moderate hepatic steatosis. Normal imaged portions of the spleen, stomach, pancreas, adrenal glands, left kidney. Musculoskeletal: No acute osseous abnormality. IMPRESSION: 1. Lung-RADS Category 2, benign appearance or behavior. Continue annual screening with low-dose chest CT without contrast in 12 months. 2. Age advanced coronary artery atherosclerosis. Recommend assessment of coronary risk  factors and consideration of medical therapy. 3. Hepatic steatosis. Electronically Signed   By: Abigail Miyamoto M.D.   On: 05/11/2015 15:20    Assessment & Plan:   Problem List Items Addressed This Visit    Tobacco abuse counseling    Smoking cessation instruction/counseling given:  counseled patient on the dangers of tobacco use, advised patient to stop smoking, and reviewed strategies to maximize success      Hyperlipidemia LDL goal <100    Given her atherosclerosis on CT ,  Upgrading simvastatin to crestor/lipitor .   Liver enzymes are normal , adding baby aspirin daily   Lab Results  Component Value Date   CHOL 171 02/09/2015   HDL 52.20 02/09/2015   LDLCALC 89 02/09/2015   LDLDIRECT 97.0 06/11/2015   TRIG 149.0 02/09/2015   CHOLHDL 3 02/09/2015   Lab Results  Component Value Date   ALT 27 06/11/2015   AST 24 06/11/2015   ALKPHOS 44 06/11/2015   BILITOT 0.4 06/11/2015         Relevant Medications   rosuvastatin (CRESTOR) 20 MG tablet   furosemide (LASIX) 20 MG tablet   Chest pain, atypical    EKG unchanged and history suggests  pain is noncardiac.  Tobacco cessation discussed.       Essential hypertension    Well controlled on current regimen. Renal function stable, no changes today.  Lab Results  Component Value Date   CREATININE 0.96 06/11/2015   Lab Results  Component Value Date   NA 142 06/11/2015   K 4.5 06/11/2015   CL 106 06/11/2015   CO2 28 06/11/2015         Relevant Medications   rosuvastatin (CRESTOR) 20 MG tablet   furosemide (LASIX) 20 MG tablet   Edema    Etiology unclear, sleep study reordered,  Furosemide started      Relevant Orders   B Nat Peptide (Completed)   Cognitive complaints    screening labs normal, may be due to untreated OSA> study is pending        Other Visit Diagnoses    Chest pain, unspecified chest pain type    -  Primary    Relevant Orders    EKG 12-Lead (Completed)    B Nat Peptide (Completed)    LDL  cholesterol, direct (Completed)    Cognitive deficits        Relevant Orders    Comprehensive metabolic panel (Completed)    TSH (Completed)    B12 (Completed)       I have discontinued Ms. Nodarse's simvastatin. I am also having her start on rosuvastatin and furosemide. Additionally, I am having her maintain her letrozole, Calcium Carbonate-Vitamin D (CALTRATE 600+D Meyers), b complex vitamins, Fish Oil, lisinopril, omeprazole, venlafaxine XR, and levothyroxine.  Meds ordered this encounter  Medications  . rosuvastatin (CRESTOR) 20 MG tablet    Sig: Take 1 tablet (20 mg total) by mouth daily.    Dispense:  90 tablet    Refill:  1  . furosemide (LASIX) 20 MG tablet    Sig: Take 1 tablet (20 mg total) by mouth daily.    Dispense:  30 tablet    Refill:  3    Medications Discontinued During This Encounter  Medication Reason  . simvastatin (ZOCOR) 20 MG tablet     Follow-up: No Follow-up on file.   [name], MD

## 2015-06-10 NOTE — Patient Instructions (Signed)
I am starting you on furosemide for your fluid retention.  One tablet daily in the nonring  Return ASAP for non fasting labs (including potassium)  Sleep study will be ordered to investigate whether this is the cause  Your cholesterol medication is being upgraded to generic Crestor   Please start taking a baay aspirin daily and TRY TO QUIT SMOKING

## 2015-06-11 ENCOUNTER — Other Ambulatory Visit (INDEPENDENT_AMBULATORY_CARE_PROVIDER_SITE_OTHER): Payer: Medicare Other

## 2015-06-11 DIAGNOSIS — R4189 Other symptoms and signs involving cognitive functions and awareness: Secondary | ICD-10-CM

## 2015-06-11 DIAGNOSIS — R079 Chest pain, unspecified: Secondary | ICD-10-CM | POA: Diagnosis not present

## 2015-06-11 DIAGNOSIS — R609 Edema, unspecified: Secondary | ICD-10-CM

## 2015-06-11 LAB — COMPREHENSIVE METABOLIC PANEL
ALK PHOS: 44 U/L (ref 39–117)
ALT: 27 U/L (ref 0–35)
AST: 24 U/L (ref 0–37)
Albumin: 4 g/dL (ref 3.5–5.2)
BILIRUBIN TOTAL: 0.4 mg/dL (ref 0.2–1.2)
BUN: 14 mg/dL (ref 6–23)
CO2: 28 mEq/L (ref 19–32)
CREATININE: 0.96 mg/dL (ref 0.40–1.20)
Calcium: 9.5 mg/dL (ref 8.4–10.5)
Chloride: 106 mEq/L (ref 96–112)
GFR: 61.61 mL/min (ref >60.00)
GLUCOSE: 105 mg/dL — AB (ref 70–99)
Potassium: 4.5 mEq/L (ref 3.5–5.1)
Sodium: 142 mEq/L (ref 135–145)
TOTAL PROTEIN: 6.7 g/dL (ref 6.0–8.3)

## 2015-06-11 LAB — LDL CHOLESTEROL, DIRECT: LDL DIRECT: 97 mg/dL

## 2015-06-11 LAB — VITAMIN B12: Vitamin B-12: 479 pg/mL (ref 211–911)

## 2015-06-11 LAB — TSH: TSH: 0.56 u[IU]/mL (ref 0.35–4.50)

## 2015-06-11 LAB — BRAIN NATRIURETIC PEPTIDE: Pro B Natriuretic peptide (BNP): 68 pg/mL (ref 0.0–100.0)

## 2015-06-12 ENCOUNTER — Encounter: Payer: Self-pay | Admitting: Internal Medicine

## 2015-06-12 ENCOUNTER — Other Ambulatory Visit: Payer: Self-pay | Admitting: Internal Medicine

## 2015-06-12 DIAGNOSIS — I2584 Coronary atherosclerosis due to calcified coronary lesion: Secondary | ICD-10-CM

## 2015-06-12 DIAGNOSIS — R419 Unspecified symptoms and signs involving cognitive functions and awareness: Secondary | ICD-10-CM | POA: Insufficient documentation

## 2015-06-12 DIAGNOSIS — R609 Edema, unspecified: Secondary | ICD-10-CM | POA: Insufficient documentation

## 2015-06-12 DIAGNOSIS — I2510 Atherosclerotic heart disease of native coronary artery without angina pectoris: Secondary | ICD-10-CM | POA: Insufficient documentation

## 2015-06-12 DIAGNOSIS — I10 Essential (primary) hypertension: Secondary | ICD-10-CM | POA: Insufficient documentation

## 2015-06-12 NOTE — Assessment & Plan Note (Addendum)
Given her atherosclerosis on CT ,  Upgrading simvastatin to crestor/lipitor .   Liver enzymes are normal , adding baby aspirin daily   Lab Results  Component Value Date   CHOL 171 02/09/2015   HDL 52.20 02/09/2015   LDLCALC 89 02/09/2015   LDLDIRECT 97.0 06/11/2015   TRIG 149.0 02/09/2015   CHOLHDL 3 02/09/2015   Lab Results  Component Value Date   ALT 27 06/11/2015   AST 24 06/11/2015   ALKPHOS 44 06/11/2015   BILITOT 0.4 06/11/2015

## 2015-06-12 NOTE — Assessment & Plan Note (Signed)
screening labs normal, may be due to untreated OSA> study is pending

## 2015-06-12 NOTE — Assessment & Plan Note (Signed)
Smoking cessation instruction/counseling given:  counseled patient on the dangers of tobacco use, advised patient to stop smoking, and reviewed strategies to maximize success 

## 2015-06-12 NOTE — Assessment & Plan Note (Signed)
Etiology unclear, sleep study reordered,  Furosemide started

## 2015-06-12 NOTE — Assessment & Plan Note (Signed)
EKG unchanged and history suggests pain is noncardiac.  Tobacco cessation discussed.

## 2015-06-12 NOTE — Assessment & Plan Note (Signed)
Well controlled on current regimen. Renal function stable, no changes today.  Lab Results  Component Value Date   CREATININE 0.96 06/11/2015   Lab Results  Component Value Date   NA 142 06/11/2015   K 4.5 06/11/2015   CL 106 06/11/2015   CO2 28 06/11/2015

## 2015-06-15 ENCOUNTER — Other Ambulatory Visit: Payer: Self-pay | Admitting: Internal Medicine

## 2015-06-15 MED ORDER — ATORVASTATIN CALCIUM 40 MG PO TABS: 40.0000 mg | ORAL_TABLET | Freq: Every day | ORAL | Status: DC

## 2015-07-03 ENCOUNTER — Ambulatory Visit
Admission: RE | Admit: 2015-07-03 | Discharge: 2015-07-03 | Disposition: A | Discharge status: Home / Self Care | Payer: Medicare Other | Source: Ambulatory Visit | Attending: Internal Medicine | Admitting: Internal Medicine

## 2015-07-03 ENCOUNTER — Ambulatory Visit (INDEPENDENT_AMBULATORY_CARE_PROVIDER_SITE_OTHER): Payer: Medicare Other | Admitting: Internal Medicine

## 2015-07-03 ENCOUNTER — Encounter: Payer: Self-pay | Admitting: Internal Medicine

## 2015-07-03 VITALS — BP 130/78 | HR 75 | Temp 98.0°F | Resp 12 | Ht 66.0 in | Wt 181.5 lb

## 2015-07-03 DIAGNOSIS — M2578 Osteophyte, vertebrae: Secondary | ICD-10-CM | POA: Diagnosis not present

## 2015-07-03 DIAGNOSIS — Z23 Encounter for immunization: Secondary | ICD-10-CM | POA: Diagnosis not present

## 2015-07-03 DIAGNOSIS — M5432 Sciatica, left side: Secondary | ICD-10-CM | POA: Diagnosis not present

## 2015-07-03 DIAGNOSIS — G500 Trigeminal neuralgia: Secondary | ICD-10-CM

## 2015-07-03 DIAGNOSIS — Z716 Tobacco abuse counseling: Secondary | ICD-10-CM | POA: Diagnosis not present

## 2015-07-03 DIAGNOSIS — M5136 Other intervertebral disc degeneration, lumbar region: Secondary | ICD-10-CM | POA: Diagnosis not present

## 2015-07-03 MED ORDER — PREDNISONE 10 MG PO TABS: ORAL_TABLET | ORAL | Status: DC

## 2015-07-03 NOTE — Patient Instructions (Addendum)
Prednisone taper for 6 days for your sciatica   Plain x rays of lumbar spine at your lesiure  1200 mg calcium daily,  Max 1 supplement daily (dietary sources for the rest)   Try the [employer] almond /toasted coconut milk  At [employer] : very tasty   PLEASE REDUCE YOUR TOBACCO USE GRADUALLY

## 2015-07-03 NOTE — Progress Notes (Signed)
Pre-visit discussion using our clinic review tool. No additional management support is needed unless otherwise documented below in the visit note.  

## 2015-07-03 NOTE — Progress Notes (Signed)
Subjective:  Patient ID: Crystal Meyers, female    DOB: 11-18-1945  Age: 69 y.o. MRN: [id number]  CC: There were no encounter diagnoses.  HPI Crystal Meyers presents for several issues   1) New onset PERSISTENT LEFT HIP PAIN RADIATING TO ANKLE FOR THE PAST 2 WEEKS.  IT IS  AGGRAVATED BY SITTING,  RELIEVED BY STANDING AND WALKING  Numbness on left lateral leg preceded symptoms by several months.     2) trigeminal  neuralgia:  Taking gabapentin inconsistently for several months,. had excessive drowsiness with tegretol : started several weeks after her dentist hit the nerve" during a numbing procedure .  Not covering face when going out in the cold.   3) Hyperlipidemia: Has been taking lipitor , going to switch to crestor once she has finsihed lipitor   4) Tobacco abuse:  Still smoking daily    Outpatient Prescriptions Prior to Visit  Medication Sig Dispense Refill  . atorvastatin (LIPITOR) 40 MG tablet Take 1 tablet (40 mg total) by mouth daily. 90 tablet 3  . b complex vitamins tablet Take 1 tablet by mouth daily.    . Calcium Carbonate-Vitamin D (CALTRATE 600+D Meyers) Take by mouth daily.    . furosemide (LASIX) 20 MG tablet Take 1 tablet (20 mg total) by mouth daily. 30 tablet 3  . gabapentin (NEURONTIN) 300 MG capsule TAKE 2 CAPSULES(600 MG) BY MOUTH FOUR TIMES DAILY 240 capsule 2  . letrozole (FEMARA) 2.5 MG tablet Take 2.5 mg by mouth daily.    [name] levothyroxine (SYNTHROID, LEVOTHROID) 75 MCG tablet TAKE 1 TABLET BY MOUTH EVERY DAY 90 tablet 0  . lisinopril (PRINIVIL,ZESTRIL) 40 MG tablet TAKE 0.5 TABLET BY MOUTH DAILY 45 tablet 1  . omeprazole (PRILOSEC) 40 MG capsule TAKE ONE CAPSULE BY MOUTH EVERY DAY 90 capsule 1  . venlafaxine XR (EFFEXOR-XR) 150 MG 24 hr capsule TAKE ONE CAPSULE BY MOUTH EVERY DAY 90 capsule 1  . rosuvastatin (CRESTOR) 20 MG tablet Take 1 tablet (20 mg total) by mouth daily. (Patient not taking: Reported on 07/03/2015) 90 tablet 1  . Omega-3 Fatty Acids (FISH  OIL) 1200 MG CAPS Take 1 capsule by mouth daily.     No facility-administered medications prior to visit.    Review of Systems;  Patient denies headache, fevers, malaise, unintentional weight loss, skin rash, eye pain, sinus congestion and sinus pain, sore throat, dysphagia,  hemoptysis , cough, dyspnea, wheezing, chest pain, palpitations, orthopnea, edema, abdominal pain, nausea, melena, diarrhea, constipation, flank pain, dysuria, hematuria, urinary  Frequency, nocturia, numbness, tingling, seizures,  Focal weakness, Loss of consciousness,  Tremor, insomnia, depression, anxiety, and suicidal ideation.      Objective:  BP 130/78 mmHg  Pulse 75  Temp(Src) 98 F (36.7 C) (Oral)  Resp 12  Ht 5\' 6"  (1.676 m)  Wt 181 lb 8 oz (82.328 kg)  BMI 29.31 kg/m2  SpO2 96%  BP Readings from Last 3 Encounters:  07/03/15 130/78  06/10/15 140/60  04/02/15 153/94    Wt Readings from Last 3 Encounters:  07/03/15 181 lb 8 oz (82.328 kg)  06/10/15 187 lb (84.823 kg)  05/11/15 183 lb (83.008 kg)    General appearance: alert, cooperative and appears stated age Ears: normal TM's and external ear canals both ears Throat: lips, mucosa, and tongue normal; teeth and gums normal Neck: no adenopathy, no carotid bruit, supple, symmetrical, trachea midline and thyroid not enlarged, symmetric, no tenderness/mass/nodules Back: symmetric, no curvature. ROM normal. No CVA tenderness.  Lungs: clear to auscultation bilaterally Heart: regular rate and rhythm, S1, S2 normal, no murmur, click, rub or gallop Abdomen: soft, non-tender; bowel sounds normal; no masses,  no organomegaly Pulses: 2+ and symmetric Skin: Skin color, texture, turgor normal. No rashes or lesions Lymph nodes: Cervical, supraclavicular, and axillary nodes normal.  Lab Results  Component Value Date   HGBA1C 5.9 02/09/2015   HGBA1C 5.7 10/25/2013   HGBA1C 6.0 08/27/2012    Lab Results  Component Value Date   CREATININE 0.96 06/11/2015    CREATININE 0.99 04/02/2015   CREATININE 0.91 02/09/2015    Lab Results  Component Value Date   WBC 6.4 04/02/2015   HGB 13.6 04/02/2015   HCT 39.6 04/02/2015   PLT 211 04/02/2015   GLUCOSE 105* 06/11/2015   CHOL 171 02/09/2015   TRIG 149.0 02/09/2015   HDL 52.20 02/09/2015   LDLDIRECT 97.0 06/11/2015   LDLCALC 89 02/09/2015   ALT 27 06/11/2015   AST 24 06/11/2015   NA 142 06/11/2015   K 4.5 06/11/2015   CL 106 06/11/2015   CREATININE 0.96 06/11/2015   BUN 14 06/11/2015   CO2 28 06/11/2015   TSH 0.56 06/11/2015   HGBA1C 5.9 02/09/2015   MICROALBUR <0.7 02/09/2015    Ct Chest Lung Ca Screen Low Dose W/o Cm  05/11/2015  CLINICAL DATA:  Lung cancer screening. Left breast cancer with bilateral mastectomy 6 years ago. Smoker. EXAM: CT CHEST WITHOUT CONTRAST TECHNIQUE: Multidetector CT imaging of the chest was performed following the standard protocol without IV contrast. COMPARISON:  04/17/2014 FINDINGS: Mediastinum/Nodes: Bilateral breast implants. No axillary adenopathy. Aortic and branch vessel atherosclerosis. Normal heart size, without pericardial effusion. Lad coronary artery atherosclerosis on image/series 33/2. No mediastinal or definite hilar adenopathy, given limitations of unenhanced CT. Lungs/Pleura: No pleural fluid. Mild centrilobular emphysema. Posterior left upper lobe 2 mm pulmonary nodule is unchanged. A superior segment left lower lobe 2 mm nodule, including on image/series 130/13 com is new. A lingular subpleural nodule is calcified, consistent with a granuloma. Upper abdomen: Moderate hepatic steatosis. Normal imaged portions of the spleen, stomach, pancreas, adrenal glands, left kidney. Musculoskeletal: No acute osseous abnormality. IMPRESSION: 1. Lung-RADS Category 2, benign appearance or behavior. Continue annual screening with low-dose chest CT without contrast in 12 months. 2. Age advanced coronary artery atherosclerosis. Recommend assessment of coronary risk  factors and consideration of medical therapy. 3. Hepatic steatosis. Electronically Signed   By: Abigail Miyamoto M.D.   On: 05/11/2015 15:20    Assessment & Plan:   Problem List Items Addressed This Visit    None     A total of 25 minutes of face to face time was spent with patient more than half of which was spent in counselling about the above mentioned conditions  and coordination of care  I have discontinued Ms. Caulfield's Fish Oil. I am also having her maintain her letrozole, Calcium Carbonate-Vitamin D (CALTRATE 600+D Meyers), b complex vitamins, lisinopril, omeprazole, venlafaxine XR, levothyroxine, rosuvastatin, furosemide, gabapentin, and atorvastatin.  No orders of the defined types were placed in this encounter.    Medications Discontinued During This Encounter  Medication Reason  . Omega-3 Fatty Acids (FISH OIL) 1200 MG CAPS Error    Follow-up: No Follow-up on file.   [name], MD

## 2015-07-06 DIAGNOSIS — M5430 Sciatica, unspecified side: Secondary | ICD-10-CM | POA: Insufficient documentation

## 2015-07-06 NOTE — Assessment & Plan Note (Signed)
Reviewed past and current treatments and prior Neurology evaluation.  Continue more consistent use of gabapentin,  Protect face when going out in the cold.

## 2015-07-06 NOTE — Assessment & Plan Note (Addendum)
Ordered prednisone taper, plain  films o f pelvis and lumbar spine . Given history of BRCA will likely need MRI to rule out metastatic diseas

## 2015-07-06 NOTE — Assessment & Plan Note (Signed)
Smoking cessation instruction/counseling given:  counseled patient on the dangers of tobacco use, advised patient to stop smoking, and reviewed strategies to maximize success 

## 2015-07-07 ENCOUNTER — Encounter: Payer: Self-pay | Admitting: Internal Medicine

## 2015-07-07 DIAGNOSIS — M5432 Sciatica, left side: Secondary | ICD-10-CM

## 2015-07-23 ENCOUNTER — Other Ambulatory Visit: Payer: Self-pay | Admitting: Internal Medicine

## 2015-07-24 ENCOUNTER — Telehealth: Payer: Self-pay | Admitting: *Deleted

## 2015-07-24 MED ORDER — TRAMADOL HCL 50 MG PO TABS: 50.0000 mg | ORAL_TABLET | Freq: Three times a day (TID) | ORAL | Status: DC | PRN

## 2015-07-24 NOTE — Telephone Encounter (Signed)
Patient was seen on 12/23, for this reason, patient was given a prednisone taper, no pain meds.  Please advise?

## 2015-07-24 NOTE — Telephone Encounter (Signed)
Patient has requested pain medication for her leg pain. She has a MRI scheduled 07/31/15. Please advise  Contact [phone] Pharmacy [facility] in [place]

## 2015-07-24 NOTE — Telephone Encounter (Signed)
Tramadol printed 

## 2015-07-24 NOTE — Telephone Encounter (Signed)
Script faxed as requested FYI.

## 2015-07-24 NOTE — Telephone Encounter (Signed)
Crystal Meyers, this is the one I am looking for, just done about an hour ago.

## 2015-07-29 ENCOUNTER — Ambulatory Visit
Admission: RE | Admit: 2015-07-29 | Discharge: 2015-07-29 | Disposition: A | Discharge status: Home / Self Care | Payer: Medicare Other | Source: Ambulatory Visit | Attending: Internal Medicine | Admitting: Internal Medicine

## 2015-07-29 ENCOUNTER — Telehealth: Payer: Self-pay

## 2015-07-29 ENCOUNTER — Encounter: Payer: Self-pay | Admitting: Internal Medicine

## 2015-07-29 DIAGNOSIS — R200 Anesthesia of skin: Secondary | ICD-10-CM | POA: Diagnosis not present

## 2015-07-29 DIAGNOSIS — M5126 Other intervertebral disc displacement, lumbar region: Secondary | ICD-10-CM | POA: Diagnosis not present

## 2015-07-29 DIAGNOSIS — M5432 Sciatica, left side: Secondary | ICD-10-CM | POA: Insufficient documentation

## 2015-07-29 DIAGNOSIS — M5441 Lumbago with sciatica, right side: Secondary | ICD-10-CM

## 2015-07-29 DIAGNOSIS — M5442 Lumbago with sciatica, left side: Principal | ICD-10-CM

## 2015-07-29 DIAGNOSIS — M5116 Intervertebral disc disorders with radiculopathy, lumbar region: Secondary | ICD-10-CM | POA: Diagnosis not present

## 2015-07-29 DIAGNOSIS — M5136 Other intervertebral disc degeneration, lumbar region: Secondary | ICD-10-CM | POA: Diagnosis not present

## 2015-07-29 MED ORDER — HYDROCODONE-ACETAMINOPHEN 5-325 MG PO TABS: 1.0000 | ORAL_TABLET | Freq: Four times a day (QID) | ORAL | Status: DC | PRN

## 2015-07-29 NOTE — Addendum Note (Signed)
Addended by: [name] on: 07/29/2015 03:07 PM   Modules accepted: Orders

## 2015-07-29 NOTE — Telephone Encounter (Signed)
vicodin rx can be picked up this afternoon.   Mri results results were sent via e mail mychart

## 2015-07-29 NOTE — Telephone Encounter (Signed)
MRI was done today at 8:30am and pt wanted to know the results the test, pt is c/o pain in left buttocks going down into her right ankle, pt is requesting something for pain. Please advise./tvw

## 2015-07-29 NOTE — Telephone Encounter (Signed)
Called pt and notified pt of Dr.Tullo's message,pt verbalized understanding./tvw

## 2015-07-31 ENCOUNTER — Encounter: Payer: Self-pay | Admitting: Internal Medicine

## 2015-07-31 ENCOUNTER — Other Ambulatory Visit: Payer: Self-pay | Admitting: Internal Medicine

## 2015-07-31 ENCOUNTER — Other Ambulatory Visit: Payer: Medicare Other

## 2015-08-03 DIAGNOSIS — M5126 Other intervertebral disc displacement, lumbar region: Secondary | ICD-10-CM | POA: Diagnosis not present

## 2015-08-04 ENCOUNTER — Other Ambulatory Visit: Payer: Self-pay | Admitting: Neurosurgery

## 2015-08-05 NOTE — Progress Notes (Signed)
Spoke with Manuela Schwartz informed her we need orders for the patients PAT appointment.

## 2015-08-07 ENCOUNTER — Encounter (HOSPITAL_COMMUNITY)
Admission: RE | Admit: 2015-08-07 | Discharge: 2015-08-07 | Disposition: A | Discharge status: Home / Self Care | Payer: Medicare Other | Source: Ambulatory Visit | Attending: Neurosurgery | Admitting: Neurosurgery

## 2015-08-07 ENCOUNTER — Encounter (HOSPITAL_COMMUNITY): Payer: Self-pay

## 2015-08-07 DIAGNOSIS — G4733 Obstructive sleep apnea (adult) (pediatric): Secondary | ICD-10-CM | POA: Insufficient documentation

## 2015-08-07 DIAGNOSIS — Z79899 Other long term (current) drug therapy: Secondary | ICD-10-CM | POA: Diagnosis not present

## 2015-08-07 DIAGNOSIS — E7800 Pure hypercholesterolemia, unspecified: Secondary | ICD-10-CM | POA: Insufficient documentation

## 2015-08-07 DIAGNOSIS — Z853 Personal history of malignant neoplasm of breast: Secondary | ICD-10-CM | POA: Insufficient documentation

## 2015-08-07 DIAGNOSIS — Z01818 Encounter for other preprocedural examination: Secondary | ICD-10-CM | POA: Insufficient documentation

## 2015-08-07 DIAGNOSIS — J449 Chronic obstructive pulmonary disease, unspecified: Secondary | ICD-10-CM | POA: Insufficient documentation

## 2015-08-07 DIAGNOSIS — M5126 Other intervertebral disc displacement, lumbar region: Secondary | ICD-10-CM | POA: Diagnosis not present

## 2015-08-07 DIAGNOSIS — F329 Major depressive disorder, single episode, unspecified: Secondary | ICD-10-CM | POA: Diagnosis not present

## 2015-08-07 DIAGNOSIS — Z01812 Encounter for preprocedural laboratory examination: Secondary | ICD-10-CM | POA: Insufficient documentation

## 2015-08-07 DIAGNOSIS — I10 Essential (primary) hypertension: Secondary | ICD-10-CM | POA: Diagnosis not present

## 2015-08-07 HISTORY — DX: Trigeminal neuralgia: G50.0

## 2015-08-07 HISTORY — DX: Adverse effect of unspecified anesthetic, initial encounter: T41.45XA

## 2015-08-07 HISTORY — DX: Chronic obstructive pulmonary disease, unspecified: J44.9

## 2015-08-07 HISTORY — DX: Barrett's esophagus without dysplasia: K22.70

## 2015-08-07 HISTORY — DX: Other complications of anesthesia, initial encounter: T88.59XA

## 2015-08-07 LAB — CBC
HCT: 38.7 % (ref 36.0–46.0)
HEMOGLOBIN: 13.1 g/dL (ref 12.0–15.0)
MCH: 30.5 pg (ref 26.0–34.0)
MCHC: 33.9 g/dL (ref 30.0–36.0)
MCV: 90.2 fL (ref 78.0–100.0)
Platelets: 239 10*3/uL (ref 150–400)
RBC: 4.29 MIL/uL (ref 3.87–5.11)
RDW: 12.3 % (ref 11.5–15.5)
WBC: 6.6 10*3/uL (ref 4.0–10.5)

## 2015-08-07 LAB — SURGICAL PCR SCREEN
MRSA, PCR: NEGATIVE
Staphylococcus aureus: POSITIVE — AB

## 2015-08-07 LAB — BASIC METABOLIC PANEL
ANION GAP: 8 (ref 5–15)
BUN: 13 mg/dL (ref 6–20)
CALCIUM: 9.7 mg/dL (ref 8.9–10.3)
CO2: 29 mmol/L (ref 22–32)
Chloride: 101 mmol/L (ref 101–111)
Creatinine, Ser: 0.99 mg/dL (ref 0.44–1.00)
GFR calc Af Amer: >60 mL/min (ref >60)
GFR, EST NON AFRICAN AMERICAN: 58 mL/min — AB (ref >60)
GLUCOSE: 123 mg/dL — AB (ref 65–99)
Potassium: 4.3 mmol/L (ref 3.5–5.1)
Sodium: 138 mmol/L (ref 135–145)

## 2015-08-07 NOTE — Progress Notes (Addendum)
PCP: Dr. Derrel Nip No cardiologist.  Pt. Stated Dr. Derrel Nip informed her she has plaque in coronary arteries and is at risk for stroke/heart attack, pt. Had a CT of chest in fall.  Pt. Reports 2010 breast surgeries she was hard to wake up and sat's decreased, almost put on  a vent. Has sleep apnea. Notified angela , PA anes. Stated to request records from [facility].  Mupirocin prescription  Called into [facility] in [place], [place] on [address].

## 2015-08-07 NOTE — Pre-Procedure Instructions (Signed)
Crystal Meyers  08/07/2015      [facility] - [address], [place] [address] Phone: [phone] Fax: [phone]  [facility] [address], [place] [address] Phone: [phone] Fax: [phone]  [facility] [place], [place] [address] Phone: [phone] Fax: [phone]  [id number] - [place], [address] [place] [address] Phone: [phone] Fax: [phone]    Your procedure is scheduled on Tuesday, August 11, 2015  Report to [facility] Admitting at 5:30 A.M.  Call this number if you have problems the morning of surgery:  [phone]   Remember:  Do not eat food or drink liquids after midnight Monday, August 10, 2015   Take these medicines the morning of surgery with A SIP OF WATER :carbamazepine (TEGRETOL),  letrozole (FEMARA), levothyroxine (SYNTHROID, LEVOTHROID), omeprazole (PRILOSEC), venlafaxine XR [facility]), if needed: hydrocodone  (NORCO/VICODIN) for pain  Stop taking Aspirin, vitamins, fish oil and herbal medications. Do not take any NSAIDs ie: Ibuprofen, Advil, Naproxen or any medication containing Aspirin; stop now.  Do not wear jewelry, make-up or nail polish.  Do not wear lotions, powders, or perfumes.  You may not wear deodorant.  Do not shave 48 hours prior to surgery.    Do not bring valuables to the hospital.  [facility] is not responsible for any belongings or valuables.  Contacts, dentures or bridgework may not be worn into surgery.  Leave your suitcase in the car.  After surgery it may be brought to your room.  For patients admitted to the hospital, discharge time will be determined by your treatment team.  Patients discharged the day of surgery will not be allowed to drive home.   Name  and phone number of your driver:    Special instructions:  Special Instructions:Special Instructions: [facility] - Preparing for Surgery  Before surgery, you can play an important role.  Because skin is not sterile, your skin needs to be as free of germs as possible.  You can reduce the number of germs on you skin by washing with CHG (chlorahexidine gluconate) soap before surgery.  CHG is an antiseptic cleaner which kills germs and bonds with the skin to continue killing germs even after washing.  Please DO NOT use if you have an allergy to CHG or antibacterial soaps.  If your skin becomes reddened/irritated stop using the CHG and inform your nurse when you arrive at Short Stay.  Do not shave (including legs and underarms) for at least 48 hours prior to the first CHG shower.  You may shave your face.  Please follow these instructions carefully:   1.  Shower with CHG Soap the night before surgery and the morning of Surgery.  2.  If you choose to wash your hair, wash your hair first as usual with your normal shampoo.  3.  After you shampoo, rinse your hair and body thoroughly to remove the Shampoo.  4.  Use CHG as you would any other liquid soap.  You can apply chg directly  to the skin and wash gently with scrungie or a clean washcloth.  5.  Apply the CHG Soap to your body ONLY FROM THE NECK DOWN.  Do not  use on open wounds or open sores.  Avoid contact with your eyes, ears, mouth and genitals (private parts).  Wash genitals (private parts) with your normal soap.  6.  Wash thoroughly, paying special attention to the area where your surgery will be performed.  7.  Thoroughly rinse your body with warm water from the neck down.  8.  DO NOT shower/wash with your normal soap after using and rinsing off the CHG Soap.  9.  Pat yourself dry with a clean towel.            10.  Wear clean pajamas.            11.  Place clean sheets on your bed the night of your first shower and do not sleep with  pets.  Day of Surgery  Do not apply any lotions/deodorants the morning of surgery.  Please wear clean clothes to the hospital/surgery center.  Please read over the following fact sheets that you were given. Pain Booklet, Coughing and Deep Breathing, MRSA Information and Surgical Site Infection Prevention

## 2015-08-10 ENCOUNTER — Other Ambulatory Visit (HOSPITAL_COMMUNITY): Payer: Medicare Other

## 2015-08-10 MED ORDER — CEFAZOLIN SODIUM-DEXTROSE 2-3 GM-% IV SOLR
2.0000 g | INTRAVENOUS | Status: AC
  Administered 2015-08-11: 2 g via INTRAVENOUS
  Filled 2015-08-10 (×2): qty 50

## 2015-08-10 NOTE — Progress Notes (Signed)
Anesthesia Chart Review: Patient is a 70 year old female scheduled for left L4-5 microdiskectomy on 08/11/15 by Dr. Kathyrn Sheriff.  History includes smoking, hypertension, hypercholesterolemia, stage 1C left breast cancer s/p bilateral mastectomies and reconstructive surgery '10, hypothyroidism, depression, OSA with CPAP, COPD, trigeminal neuralgia, Barrett's esophagus. She reported that following one of her breast surgeries at [facility], she had hypoxemia and almost had to be re-intubated. Reported being told by her PCP Dr. Deborra Medina that she had evidence of plaque in her coronary arteries. No reported chest pain, but was seen by Dr. Derrel Nip on 06/10/15 for atypical chest pain described as a "electric shock" that lasted only a second. Dr. Derrel Nip did not feel symptoms were suggestive of cardiac etiology. BMI is 29. Oncologist is Dr. Oliva Bustard.   Meds include Lipitor, ASA (on hold), Tegretol, Lasix, Neurontin, Femara, levothyroxine, lisinopril, Prilosec, tramadol, Effexor.   06/10/15 EKG: SR, 2 PVCs, low voltage, possible pulmonary disease.  05/11/15 CT Chest Lung (CA screen): IMPRESSION: 1. Lung-RADS Category 2, benign appearance or behavior. Continue annual screening with low-dose chest CT without contrast in 12 months. 2. Age advanced coronary artery atherosclerosis. Recommend assessment of coronary risk factors and consideration of medical therapy. 3. Hepatic steatosis. Following results, Dr. Derrel Nip upgraded simvastatin to Lipitor.  Preoperative labs noted.   Reviewed above with anesthesiologist Dr. Lissa Hoard. If no worrisome CV symptoms then it is anticipated that she can proceed as planned.  George Hugh [facility] Short Stay Center/Anesthesiology Phone [phone] 08/10/2015 12:29 PM

## 2015-08-10 NOTE — Anesthesia Preprocedure Evaluation (Addendum)
Anesthesia Evaluation  Patient identified by MRN, date of birth, ID band Patient awake    Reviewed: Allergy & Precautions, H&P , NPO status , Patient's Chart, lab work & pertinent test results  Airway Mallampati: II  TM Distance: >3 FB Neck ROM: full    Dental  (+) Dental Advisory Given, Caps All upper front are capped:   Pulmonary neg pulmonary ROS, asthma , sleep apnea and Continuous Positive Airway Pressure Ventilation , COPD, Current Smoker,  Moderate COPD.  Has had respiratory problems after anesthesia before.   Pulmonary exam normal breath sounds clear to auscultation       Cardiovascular Exercise Tolerance: Good hypertension, Pt. on medications negative cardio ROS Normal cardiovascular exam Rhythm:regular Rate:Normal     Neuro/Psych PSYCHIATRIC DISORDERS Depression Trigeminal neuralgia  Neuromuscular disease negative neurological ROS  negative psych ROS   GI/Hepatic negative GI ROS, Neg liver ROS, GERD  Controlled and Medicated,  Endo/Other  negative endocrine ROSHypothyroidism   Renal/GU negative Renal ROS  negative genitourinary   Musculoskeletal   Abdominal   Peds  Hematology negative hematology ROS (+)   Anesthesia Other Findings Breast cancer  Reproductive/Obstetrics negative OB ROS                            Anesthesia Physical Anesthesia Plan  ASA: III  Anesthesia Plan: General   Post-op Pain Management:    Induction: Intravenous  Airway Management Planned: Oral ETT  Additional Equipment:   Intra-op Plan:   Post-operative Plan: Extubation in OR  Informed Consent: I have reviewed the patients History and Physical, chart, labs and discussed the procedure including the risks, benefits and alternatives for the proposed anesthesia with the patient or authorized representative who has indicated his/her understanding and acceptance.   Dental Advisory Given  Plan  Discussed with: CRNA and Surgeon  Anesthesia Plan Comments:         Anesthesia Quick Evaluation

## 2015-08-11 ENCOUNTER — Ambulatory Visit (HOSPITAL_COMMUNITY): Payer: Medicare Other | Admitting: Anesthesiology

## 2015-08-11 ENCOUNTER — Observation Stay (HOSPITAL_COMMUNITY)
Admission: RE | Admit: 2015-08-11 | Discharge: 2015-08-11 | Disposition: A | Discharge status: Home / Self Care | Payer: Medicare Other | Source: Ambulatory Visit | Attending: Neurosurgery | Admitting: Neurosurgery

## 2015-08-11 ENCOUNTER — Encounter (HOSPITAL_COMMUNITY)
Admission: RE | Disposition: A | Discharge status: Home / Self Care | Payer: Self-pay | Source: Ambulatory Visit | Attending: Neurosurgery

## 2015-08-11 ENCOUNTER — Ambulatory Visit (HOSPITAL_COMMUNITY): Payer: Medicare Other

## 2015-08-11 ENCOUNTER — Ambulatory Visit (HOSPITAL_COMMUNITY): Payer: Medicare Other | Admitting: Emergency Medicine

## 2015-08-11 DIAGNOSIS — F17210 Nicotine dependence, cigarettes, uncomplicated: Secondary | ICD-10-CM | POA: Diagnosis not present

## 2015-08-11 DIAGNOSIS — M5126 Other intervertebral disc displacement, lumbar region: Principal | ICD-10-CM | POA: Diagnosis present

## 2015-08-11 DIAGNOSIS — G4730 Sleep apnea, unspecified: Secondary | ICD-10-CM | POA: Diagnosis not present

## 2015-08-11 DIAGNOSIS — Z7982 Long term (current) use of aspirin: Secondary | ICD-10-CM | POA: Insufficient documentation

## 2015-08-11 DIAGNOSIS — K219 Gastro-esophageal reflux disease without esophagitis: Secondary | ICD-10-CM | POA: Diagnosis not present

## 2015-08-11 DIAGNOSIS — G500 Trigeminal neuralgia: Secondary | ICD-10-CM | POA: Diagnosis not present

## 2015-08-11 DIAGNOSIS — E039 Hypothyroidism, unspecified: Secondary | ICD-10-CM | POA: Insufficient documentation

## 2015-08-11 DIAGNOSIS — I10 Essential (primary) hypertension: Secondary | ICD-10-CM | POA: Diagnosis not present

## 2015-08-11 DIAGNOSIS — M5116 Intervertebral disc disorders with radiculopathy, lumbar region: Secondary | ICD-10-CM | POA: Diagnosis not present

## 2015-08-11 DIAGNOSIS — J449 Chronic obstructive pulmonary disease, unspecified: Secondary | ICD-10-CM | POA: Insufficient documentation

## 2015-08-11 DIAGNOSIS — Z419 Encounter for procedure for purposes other than remedying health state, unspecified: Secondary | ICD-10-CM

## 2015-08-11 DIAGNOSIS — M4326 Fusion of spine, lumbar region: Secondary | ICD-10-CM | POA: Diagnosis not present

## 2015-08-11 DIAGNOSIS — Z853 Personal history of malignant neoplasm of breast: Secondary | ICD-10-CM | POA: Diagnosis not present

## 2015-08-11 HISTORY — PX: LUMBAR LAMINECTOMY/DECOMPRESSION MICRODISCECTOMY: SHX5026

## 2015-08-11 SURGERY — LUMBAR LAMINECTOMY/DECOMPRESSION MICRODISCECTOMY 1 LEVEL
Anesthesia: General | Laterality: Left

## 2015-08-11 MED ORDER — SODIUM CHLORIDE 0.9% FLUSH: 3.0000 mL | Freq: Two times a day (BID) | INTRAVENOUS | Status: DC

## 2015-08-11 MED ORDER — CEFAZOLIN SODIUM 1-5 GM-% IV SOLN: 1.0000 g | Freq: Three times a day (TID) | INTRAVENOUS | Status: DC

## 2015-08-11 MED ORDER — THROMBIN 5000 UNITS EX SOLR
OROMUCOSAL | Status: DC | PRN
  Administered 2015-08-11: 09:00:00 via TOPICAL

## 2015-08-11 MED ORDER — CENTRUM SILVER ADULT 50+ PO TABS: ORAL_TABLET | Freq: Every morning | ORAL | Status: DC

## 2015-08-11 MED ORDER — SODIUM CHLORIDE 0.9 % IR SOLN
Status: DC | PRN
  Administered 2015-08-11: 09:00:00

## 2015-08-11 MED ORDER — HEMOSTATIC AGENTS (NO CHARGE) OPTIME
TOPICAL | Status: DC | PRN
  Administered 2015-08-11: 1 via TOPICAL

## 2015-08-11 MED ORDER — CARBAMAZEPINE 200 MG PO TABS
200.0000 mg | ORAL_TABLET | Freq: Three times a day (TID) | ORAL | Status: DC
  Filled 2015-08-11 (×2): qty 1

## 2015-08-11 MED ORDER — OXYCODONE-ACETAMINOPHEN 10-325 MG PO TABS: 1.0000 | ORAL_TABLET | ORAL | Status: DC | PRN

## 2015-08-11 MED ORDER — ONDANSETRON HCL 4 MG/2ML IJ SOLN
INTRAMUSCULAR | Status: DC | PRN
  Administered 2015-08-11: 4 mg via INTRAVENOUS

## 2015-08-11 MED ORDER — EPHEDRINE SULFATE 50 MG/ML IJ SOLN
INTRAMUSCULAR | Status: DC | PRN
  Administered 2015-08-11: 10 mg via INTRAVENOUS

## 2015-08-11 MED ORDER — LISINOPRIL 20 MG PO TABS: 40.0000 mg | ORAL_TABLET | Freq: Every day | ORAL | Status: DC

## 2015-08-11 MED ORDER — ATORVASTATIN CALCIUM 20 MG PO TABS: 40.0000 mg | ORAL_TABLET | Freq: Every day | ORAL | Status: DC

## 2015-08-11 MED ORDER — ROSUVASTATIN CALCIUM 20 MG PO TABS: 20.0000 mg | ORAL_TABLET | Freq: Every day | ORAL | Status: DC

## 2015-08-11 MED ORDER — HYDROMORPHONE HCL 1 MG/ML IJ SOLN: 0.2500 mg | INTRAMUSCULAR | Status: DC | PRN

## 2015-08-11 MED ORDER — LIDOCAINE-EPINEPHRINE 1 %-1:100000 IJ SOLN
INTRAMUSCULAR | Status: DC | PRN
  Administered 2015-08-11: 7 mL

## 2015-08-11 MED ORDER — MUPIROCIN 2 % EX OINT: 1.0000 "application " | TOPICAL_OINTMENT | Freq: Two times a day (BID) | CUTANEOUS | Status: DC

## 2015-08-11 MED ORDER — LETROZOLE 2.5 MG PO TABS: 2.5000 mg | ORAL_TABLET | Freq: Every day | ORAL | Status: DC

## 2015-08-11 MED ORDER — MIDAZOLAM HCL 2 MG/2ML IJ SOLN
INTRAMUSCULAR | Status: AC
  Filled 2015-08-11: qty 2

## 2015-08-11 MED ORDER — LIDOCAINE HCL (CARDIAC) 20 MG/ML IV SOLN
INTRAVENOUS | Status: DC | PRN
  Administered 2015-08-11: 60 mg via INTRAVENOUS

## 2015-08-11 MED ORDER — SODIUM CHLORIDE 0.9 % IV SOLN: 250.0000 mL | INTRAVENOUS | Status: DC

## 2015-08-11 MED ORDER — PHENOL 1.4 % MT LIQD: 1.0000 | OROMUCOSAL | Status: DC | PRN

## 2015-08-11 MED ORDER — ARTIFICIAL TEARS OP OINT
TOPICAL_OINTMENT | OPHTHALMIC | Status: DC | PRN
  Administered 2015-08-11: 1 via OPHTHALMIC

## 2015-08-11 MED ORDER — THROMBIN 5000 UNITS EX SOLR
CUTANEOUS | Status: DC | PRN
  Administered 2015-08-11 (×2): 5000 [IU] via TOPICAL

## 2015-08-11 MED ORDER — OXYCODONE-ACETAMINOPHEN 5-325 MG PO TABS
1.0000 | ORAL_TABLET | ORAL | Status: DC | PRN
  Administered 2015-08-11 (×2): 2 via ORAL
  Filled 2015-08-11 (×2): qty 2

## 2015-08-11 MED ORDER — LACTATED RINGERS IV SOLN: INTRAVENOUS | Status: DC

## 2015-08-11 MED ORDER — PROPOFOL 10 MG/ML IV BOLUS
INTRAVENOUS | Status: DC | PRN
  Administered 2015-08-11: 150 mg via INTRAVENOUS

## 2015-08-11 MED ORDER — 0.9 % SODIUM CHLORIDE (POUR BTL) OPTIME
TOPICAL | Status: DC | PRN
  Administered 2015-08-11: 1000 mL

## 2015-08-11 MED ORDER — MIDAZOLAM HCL 5 MG/5ML IJ SOLN
INTRAMUSCULAR | Status: DC | PRN
  Administered 2015-08-11: 2 mg via INTRAVENOUS

## 2015-08-11 MED ORDER — DEXTROSE 5 % IV SOLN: 500.0000 mg | Freq: Four times a day (QID) | INTRAVENOUS | Status: DC | PRN

## 2015-08-11 MED ORDER — GABAPENTIN 400 MG PO CAPS: 1200.0000 mg | ORAL_CAPSULE | Freq: Three times a day (TID) | ORAL | Status: DC

## 2015-08-11 MED ORDER — SODIUM CHLORIDE 0.9% FLUSH: 3.0000 mL | INTRAVENOUS | Status: DC | PRN

## 2015-08-11 MED ORDER — PROPOFOL 10 MG/ML IV BOLUS
INTRAVENOUS | Status: AC
  Filled 2015-08-11: qty 20

## 2015-08-11 MED ORDER — SENNA 8.6 MG PO TABS
1.0000 | ORAL_TABLET | Freq: Two times a day (BID) | ORAL | Status: DC
  Administered 2015-08-11: 8.6 mg via ORAL
  Filled 2015-08-11: qty 1

## 2015-08-11 MED ORDER — METHOCARBAMOL 500 MG PO TABS: 500.0000 mg | ORAL_TABLET | Freq: Four times a day (QID) | ORAL | Status: DC | PRN

## 2015-08-11 MED ORDER — METHYLPREDNISOLONE ACETATE 80 MG/ML IJ SUSP
INTRAMUSCULAR | Status: DC | PRN
  Administered 2015-08-11: 80 mg

## 2015-08-11 MED ORDER — BUPIVACAINE HCL (PF) 0.5 % IJ SOLN
INTRAMUSCULAR | Status: DC | PRN
  Administered 2015-08-11: 7 mL

## 2015-08-11 MED ORDER — LACTATED RINGERS IV SOLN
INTRAVENOUS | Status: DC | PRN
  Administered 2015-08-11 (×2): via INTRAVENOUS

## 2015-08-11 MED ORDER — SUCCINYLCHOLINE CHLORIDE 20 MG/ML IJ SOLN
INTRAMUSCULAR | Status: DC | PRN
  Administered 2015-08-11: 140 mg via INTRAVENOUS

## 2015-08-11 MED ORDER — MENTHOL 3 MG MT LOZG: 1.0000 | LOZENGE | OROMUCOSAL | Status: DC | PRN

## 2015-08-11 MED ORDER — LEVOTHYROXINE SODIUM 75 MCG PO TABS
75.0000 ug | ORAL_TABLET | Freq: Every day | ORAL | Status: DC
  Filled 2015-08-11: qty 1

## 2015-08-11 MED ORDER — ADULT MULTIVITAMIN W/MINERALS CH: 1.0000 | ORAL_TABLET | Freq: Every day | ORAL | Status: DC

## 2015-08-11 MED ORDER — VENLAFAXINE HCL ER 75 MG PO CP24: 150.0000 mg | ORAL_CAPSULE | Freq: Every day | ORAL | Status: DC

## 2015-08-11 MED ORDER — GLYCOPYRROLATE 0.2 MG/ML IJ SOLN
INTRAMUSCULAR | Status: DC | PRN
  Administered 2015-08-11: 0.6 mg via INTRAVENOUS

## 2015-08-11 MED ORDER — METHOCARBAMOL 500 MG PO TABS: 500.0000 mg | ORAL_TABLET | Freq: Four times a day (QID) | ORAL | Status: DC

## 2015-08-11 MED ORDER — MUPIROCIN 2 % EX OINT
TOPICAL_OINTMENT | CUTANEOUS | Status: AC
  Administered 2015-08-11: 07:00:00
  Filled 2015-08-11: qty 22

## 2015-08-11 MED ORDER — PANTOPRAZOLE SODIUM 40 MG PO TBEC: 40.0000 mg | DELAYED_RELEASE_TABLET | Freq: Every day | ORAL | Status: DC

## 2015-08-11 MED ORDER — SODIUM CHLORIDE 0.9 % IV SOLN: INTRAVENOUS | Status: DC

## 2015-08-11 MED ORDER — ONDANSETRON HCL 4 MG/2ML IJ SOLN
4.0000 mg | INTRAMUSCULAR | Status: DC | PRN
  Filled 2015-08-11: qty 2

## 2015-08-11 MED ORDER — NEOSTIGMINE METHYLSULFATE 10 MG/10ML IV SOLN
INTRAVENOUS | Status: DC | PRN
  Administered 2015-08-11: 5 mg via INTRAVENOUS

## 2015-08-11 MED ORDER — DOCUSATE SODIUM 100 MG PO CAPS
100.0000 mg | ORAL_CAPSULE | Freq: Two times a day (BID) | ORAL | Status: DC
  Administered 2015-08-11: 100 mg via ORAL
  Filled 2015-08-11: qty 1

## 2015-08-11 MED ORDER — FENTANYL CITRATE (PF) 100 MCG/2ML IJ SOLN
INTRAMUSCULAR | Status: DC | PRN
  Administered 2015-08-11: 100 ug via INTRAVENOUS
  Administered 2015-08-11: 50 ug via INTRAVENOUS
  Administered 2015-08-11: 100 ug via INTRAVENOUS

## 2015-08-11 MED ORDER — ROCURONIUM BROMIDE 100 MG/10ML IV SOLN
INTRAVENOUS | Status: DC | PRN
  Administered 2015-08-11: 30 mg via INTRAVENOUS

## 2015-08-11 MED ORDER — HYDROCODONE-ACETAMINOPHEN 5-325 MG PO TABS: 1.0000 | ORAL_TABLET | Freq: Four times a day (QID) | ORAL | Status: DC | PRN

## 2015-08-11 MED ORDER — FENTANYL CITRATE (PF) 250 MCG/5ML IJ SOLN
INTRAMUSCULAR | Status: AC
  Filled 2015-08-11: qty 5

## 2015-08-11 SURGICAL SUPPLY — 58 items
APL SKNCLS STERI-STRIP NONHPOA (GAUZE/BANDAGES/DRESSINGS)
BAG DECANTER FOR FLEXI CONT (MISCELLANEOUS) ×2 IMPLANT
BENZOIN TINCTURE PRP APPL 2/3 (GAUZE/BANDAGES/DRESSINGS) IMPLANT
BLADE CLIPPER SURG (BLADE) IMPLANT
BLADE SURG 11 STRL SS (BLADE) ×2 IMPLANT
BUR MATCHSTICK NEURO 3.0 LAGG (BURR) ×2 IMPLANT
CANISTER SUCT 3000ML PPV (MISCELLANEOUS) ×2 IMPLANT
DECANTER SPIKE VIAL GLASS SM (MISCELLANEOUS) ×2 IMPLANT
DRAPE LAPAROTOMY 100X72X124 (DRAPES) ×2 IMPLANT
DRAPE MICROSCOPE LEICA (MISCELLANEOUS) ×2 IMPLANT
DRAPE POUCH INSTRU U-SHP 10X18 (DRAPES) ×2 IMPLANT
DRAPE SURG 17X23 STRL (DRAPES) ×2 IMPLANT
DRSG OPSITE POSTOP 3X4 (GAUZE/BANDAGES/DRESSINGS) ×2 IMPLANT
DURAPREP 26ML APPLICATOR (WOUND CARE) ×2 IMPLANT
ELECT REM PT RETURN 9FT ADLT (ELECTROSURGICAL) ×2
ELECTRODE REM PT RTRN 9FT ADLT (ELECTROSURGICAL) ×1 IMPLANT
GAUZE SPONGE 4X4 12PLY STRL (GAUZE/BANDAGES/DRESSINGS) IMPLANT
GAUZE SPONGE 4X4 16PLY XRAY LF (GAUZE/BANDAGES/DRESSINGS) IMPLANT
GLOVE BIOGEL PI IND STRL 7.5 (GLOVE) ×1 IMPLANT
GLOVE BIOGEL PI IND STRL 8.5 (GLOVE) IMPLANT
GLOVE BIOGEL PI INDICATOR 7.5 (GLOVE) ×1
GLOVE BIOGEL PI INDICATOR 8.5 (GLOVE) ×1
GLOVE ECLIPSE 7.0 STRL STRAW (GLOVE) ×2 IMPLANT
GLOVE ECLIPSE 8.5 STRL (GLOVE) ×1 IMPLANT
GLOVE EXAM NITRILE LRG STRL (GLOVE) IMPLANT
GLOVE EXAM NITRILE MD LF STRL (GLOVE) IMPLANT
GLOVE EXAM NITRILE XL STR (GLOVE) IMPLANT
GLOVE EXAM NITRILE XS STR PU (GLOVE) IMPLANT
GOWN STRL REUS W/ TWL LRG LVL3 (GOWN DISPOSABLE) ×2 IMPLANT
GOWN STRL REUS W/ TWL XL LVL3 (GOWN DISPOSABLE) IMPLANT
GOWN STRL REUS W/TWL 2XL LVL3 (GOWN DISPOSABLE) ×1 IMPLANT
GOWN STRL REUS W/TWL LRG LVL3 (GOWN DISPOSABLE) ×4
GOWN STRL REUS W/TWL XL LVL3 (GOWN DISPOSABLE)
HEMOSTAT POWDER KIT SURGIFOAM (HEMOSTASIS) ×2 IMPLANT
KIT BASIN OR (CUSTOM PROCEDURE TRAY) ×2 IMPLANT
KIT ROOM TURNOVER OR (KITS) ×2 IMPLANT
LIQUID BAND (GAUZE/BANDAGES/DRESSINGS) ×2 IMPLANT
NDL HYPO 18GX1.5 BLUNT FILL (NEEDLE) IMPLANT
NDL HYPO 25X1 1.5 SAFETY (NEEDLE) ×1 IMPLANT
NDL SPNL 18GX3.5 QUINCKE PK (NEEDLE) IMPLANT
NEEDLE HYPO 18GX1.5 BLUNT FILL (NEEDLE) ×2 IMPLANT
NEEDLE HYPO 25X1 1.5 SAFETY (NEEDLE) ×2 IMPLANT
NEEDLE SPNL 18GX3.5 QUINCKE PK (NEEDLE) IMPLANT
NS IRRIG 1000ML POUR BTL (IV SOLUTION) ×2 IMPLANT
PACK LAMINECTOMY NEURO (CUSTOM PROCEDURE TRAY) ×2 IMPLANT
PAD ARMBOARD 7.5X6 YLW CONV (MISCELLANEOUS) ×6 IMPLANT
RUBBERBAND STERILE (MISCELLANEOUS) ×4 IMPLANT
SPONGE LAP 4X18 X RAY DECT (DISPOSABLE) IMPLANT
SPONGE SURGIFOAM ABS GEL SZ50 (HEMOSTASIS) ×2 IMPLANT
STRIP CLOSURE SKIN 1/2X4 (GAUZE/BANDAGES/DRESSINGS) IMPLANT
SUT VIC AB 0 CT1 18XCR BRD8 (SUTURE) ×1 IMPLANT
SUT VIC AB 0 CT1 8-18 (SUTURE) ×2
SUT VIC AB 2-0 CT1 18 (SUTURE) IMPLANT
SUT VICRYL 3-0 RB1 18 ABS (SUTURE) ×4 IMPLANT
SYR 3ML LL SCALE MARK (SYRINGE) ×1 IMPLANT
TOWEL OR 17X24 6PK STRL BLUE (TOWEL DISPOSABLE) ×2 IMPLANT
TOWEL OR 17X26 10 PK STRL BLUE (TOWEL DISPOSABLE) ×2 IMPLANT
WATER STERILE IRR 1000ML POUR (IV SOLUTION) ×2 IMPLANT

## 2015-08-11 NOTE — Anesthesia Postprocedure Evaluation (Signed)
Anesthesia Post Note  Patient: Crystal Meyers  Procedure(s) Performed: Procedure(s) (LRB): Left Lumbar four-five microdiscectomy (Left)  Patient location during evaluation: PACU Anesthesia Type: General Level of consciousness: awake and alert Pain management: pain level controlled Vital Signs Assessment: post-procedure vital signs reviewed and stable Respiratory status: spontaneous breathing, nonlabored ventilation, respiratory function stable and patient connected to nasal cannula oxygen Cardiovascular status: blood pressure returned to baseline and stable Postop Assessment: no signs of nausea or vomiting Anesthetic complications: no    Last Vitals:  Filed Vitals:   08/11/15 1015 08/11/15 1022  BP:    Pulse: 72 73  Temp:    Resp: 23 18    Last Pain:  Filed Vitals:   08/11/15 1022  PainSc: 4                  Ellysia Char L

## 2015-08-11 NOTE — Anesthesia Procedure Notes (Signed)
Procedure Name: Intubation Date/Time: 08/11/2015 7:54 AM Performed by: Jacquiline Doe A Pre-anesthesia Checklist: Patient identified, Emergency Drugs available, Suction available, Patient being monitored and Timeout performed Patient Re-evaluated:Patient Re-evaluated prior to inductionOxygen Delivery Method: Circle system utilized Preoxygenation: Pre-oxygenation with 100% oxygen Intubation Type: IV induction and Cricoid Pressure applied Ventilation: Mask ventilation without difficulty Laryngoscope Size: Mac and 4 Grade View: Grade I Tube type: Oral Tube size: 7.0 mm Number of attempts: 1 Airway Equipment and Method: Rigid stylet and Video-laryngoscopy Placement Confirmation: ETT inserted through vocal cords under direct vision,  breath sounds checked- equal and bilateral and positive ETCO2 Secured at: 22 cm Tube secured with: Tape

## 2015-08-11 NOTE — Progress Notes (Signed)
Pt doing well. Pt has ambulated, voided, and pain is controlled with oral medication. Pt's incision is clean and dry with nos sign of infection. Pt's IV was removed prior to D/C. Pt and daughter given D/C instructions with Rx's, verbal understanding was provided. Pt D/C'd home via wheelchair @ 1815 per MD order. Pt is stable @ D/C and has no other needs at this time. Holli Humbles, RN

## 2015-08-11 NOTE — Transfer of Care (Signed)
Immediate Anesthesia Transfer of Care Note  Patient: Crystal Meyers  Procedure(s) Performed: Procedure(s): Left Lumbar four-five microdiscectomy (Left)  Patient Location: PACU  Anesthesia Type:General  Level of Consciousness: awake, oriented, sedated, patient cooperative and responds to stimulation  Airway & Oxygen Therapy: Patient Spontanous Breathing and Patient connected to nasal cannula oxygen  Post-op Assessment: Report given to RN, Post -op Vital signs reviewed and stable, Patient moving all extremities and Patient moving all extremities X 4  Post vital signs: Reviewed and stable  Last Vitals:  Filed Vitals:   08/11/15 0938 08/11/15 0941  BP:  117/88  Pulse: 89 86  Temp: 36.2 C   Resp: 21 26    Complications: No apparent anesthesia complications

## 2015-08-11 NOTE — Progress Notes (Signed)
Placed pt. On CPAP of 10cm H2O per pt. Comfort (pt. Is unaware of home settings) via pt.'s nasal pillows from home. Pt. Is tolerating CPAP well at this time without any complications.

## 2015-08-11 NOTE — H&P (Signed)
CC:  Back and left leg pain  HPI: Crystal Meyers is a 70 year old woman I'm seeing for a primary complaint of about 2 month history of left-sided back and leg pain. The pain started without any identifiable inciting event. She describes the pain as a very severe, sharp pain which runs down from her left buttock, into the lateral aspect of her left thigh and calf, and into the foot. She says that walking does seem to help the pain somewhat. She says whenever she is sitting or lying down stationary, the pain is very severe. She is never had any similar episodes in the past. She did have some right-sided hip pain about a year ago, but this resolved spontaneously over a couple of days. She has not noted any changes in bladder function. She does note some numbness involving the left thigh. Over the past 2 months, she has been tried on a course of prednisone which did not help, Flexeril which also did not help, and tramadol which did not help. She had a friend who had OxyContin that she tried, and this "knocked her out." She has also been given a prescription for Vicodin which takes the edge off the pain   PMH: Past Medical History  Diagnosis Date  . Depression   . Hypertension   . Hypercholesteremia   . Breast cancer ([place]) 7/10    left , invasive lobular carcinoma  . Sleep apnea   . Hypothyroidism   . Personal history of tobacco use, presenting hazards to health 05/06/2015  . Personal history of tobacco use, presenting hazards to health 05/06/2015  . COPD (chronic obstructive pulmonary disease) ([place])   . Barrett's esophagus   . Trigeminal neuralgia   . Complication of anesthesia     difficulty waking up and sleep apnea, low sats    PSH: Past Surgical History  Procedure Laterality Date  . Mastectomy  05/2009    bilateral, reconstructive surgery 09/2009  . Mastectomy      Bilateral   . Breast surgery Bilateral 2011    dbl mastectomy    SH: Social History  Substance Use Topics  . Smoking  status: Current Every Day Smoker -- 1.00 packs/day for 50 years    Types: Cigarettes  . Smokeless tobacco: Never Used  . Alcohol Use: No    MEDS: Prior to Admission medications   Medication Sig Start Date End Date Taking? Authorizing Provider  aspirin EC 81 MG tablet Take 81 mg by mouth daily.   Yes Historical Provider, MD  atorvastatin (LIPITOR) 40 MG tablet Take 1 tablet (40 mg total) by mouth daily. 06/15/15  Yes [name], MD  Calcium Carbonate-Vitamin D (CALTRATE 600+D PO) Take 1 tablet by mouth daily.    Yes Historical Provider, MD  carbamazepine (TEGRETOL) 200 MG tablet Take 200 mg by mouth 3 (three) times daily.   Yes Historical Provider, MD  furosemide (LASIX) 20 MG tablet Take 1 tablet (20 mg total) by mouth daily. Patient taking differently: Take 20 mg by mouth daily as needed for fluid.  06/10/15  Yes [name], MD  gabapentin (NEURONTIN) 300 MG capsule TAKE 2 CAPSULES(600 MG) BY MOUTH FOUR TIMES DAILY 06/12/15  Yes [name], MD  HYDROcodone-acetaminophen (NORCO/VICODIN) 5-325 MG tablet Take 1 tablet by mouth every 6 (six) hours as needed for moderate pain. Maximum 3 daily 07/29/15  Yes [name], MD  letrozole [facility]) 2.5 MG tablet Take 2.5 mg by mouth daily.   Yes Historical Provider,  MD  levothyroxine (SYNTHROID, LEVOTHROID) 75 MCG tablet TAKE 1 TABLET BY MOUTH EVERY DAY 07/23/15  Yes [name], MD  lisinopril (PRINIVIL,ZESTRIL) 40 MG tablet TAKE 0.5 TABLET BY MOUTH DAILY 10/15/14  Yes [name], MD  Multiple Vitamins-Minerals (CENTRUM SILVER ADULT 50+) TABS Take by mouth.   Yes Historical Provider, MD  omeprazole (PRILOSEC) 40 MG capsule TAKE ONE CAPSULE BY MOUTH EVERY DAY 10/15/14  Yes [name], MD  rosuvastatin (CRESTOR) 20 MG tablet Take 1 tablet (20 mg total) by mouth daily. 06/10/15  Yes [name], MD  simvastatin (ZOCOR) 20 MG tablet TAKE 1 TABLET(20 MG) BY MOUTH AT BEDTIME 07/23/15  Yes [name], MD  venlafaxine XR (EFFEXOR-XR)  150 MG 24 hr capsule TAKE 1 CAPSULE BY MOUTH EVERY DAY 07/23/15  Yes [name], MD  b complex vitamins tablet Take 1 tablet by mouth daily.    Historical Provider, MD  predniSONE (DELTASONE) 10 MG tablet 6 tablets on Day 1 , then reduce by 1 tablet daily until gone Patient not taking: Reported on 08/05/2015 07/03/15   [name], MD  traMADol (ULTRAM) 50 MG tablet Take 1 tablet (50 mg total) by mouth every 8 (eight) hours as needed. Patient not taking: Reported on 08/05/2015 07/24/15   [name], MD    ALLERGY: No Known Allergies  ROS: ROS  NEUROLOGIC EXAM: Awake, alert, oriented Memory and concentration grossly intact Speech fluent, appropriate CN grossly intact Motor exam: Upper Extremities Deltoid Bicep Tricep Grip  Right 5/5 5/5 5/5 5/5  Left 5/5 5/5 5/5 5/5   Lower Extremity IP Quad PF DF EHL  Right 5/5 5/5 5/5 5/5 5/5  Left 5/5 5/5 5/5 5/5 5/5   Sensation grossly intact to LT  [facility]: MRI of the lumbar spine dated 07/29/15 was reviewed. Primary finding is at L4 L5, where there is a left eccentric disc herniation with inferior migration compressing the left L5 nerve root.    IMPRESSION: 70 year old woman with left L5 radiculopathy likely related to large left eccentric inferiorly migrated L4 L5 disc herniation.  PLAN: We will plan on proceeding with left L4 L5 laminotomy and microdiscectomy   The radiographic findings and general treatment options were discussed in detail with the patient in the office. Given her symtpom progression over the last 2 months, I offered the above surgical decompression. We did also discuss the possibility of continued conservative treatment. The risks of the surgery were discussed in detail, including but not limited to bleeding, infection, nerve injury resulting in leg/foot weakness or bowel/bladder dysfunction, and CSF leak. Possible outcomes of surgery were also discussed including the possibility of uncomplicated surgery but  persistence of pain symptoms.   The patient understood our discussion and is willing to proceed with surgery. All her questions were answered.

## 2015-08-11 NOTE — Op Note (Signed)
PREOP DIAGNOSIS: Lumbar disc herniation, left L4-5  POSTOP DIAGNOSIS: Same  PROCEDURE: 1. Left L4-5 laminotomy and microdiscectomy for decompression of nerve root 2. Use of operating microscope  SURGEON: Dr. [name], MD  ASSISTANT: Dr. Kristeen Miss, MD  ANESTHESIA: General Endotracheal  EBL: 50cc  SPECIMENS: None  DRAINS: None  COMPLICATIONS: None immediate  CONDITION: Hemodynamically stable to PACU  HISTORY: Crystal Meyers is a 70 y.o. female initially seen in the outpatient clinic with left-sided back and leg pain consistent with radiculopathy at L5. She has attempted conservative treatment which failed, and she elected to proceed with surgical decompression. Risks and benefits were reviewed with the patient. After all questions were answered, informed consent was obtained.  PROCEDURE IN DETAIL: After informed consent was obtained and witnessed, the patient was brought to the operating room. After induction of general anesthesia, the patient was positioned on the operative table in the prone position with all pressure points meticulously padded. The skin of the low back was then prepped and draped in the usual sterile fashion.  Under Xray, the correct level was identified and marked out on the skin, and after timeout was conducted, the skin was infiltrated with local anesthetic. Skin incision was then made sharply and Bovie electrocautery was used to dissect the subcutaneous tissue until the lumbodorsal fascia was identified. The fascia was then incised using Bovie electrocautery and the lamina at the L4 and L5 levels was identified and dissection was carried out in the subperiosteal plane. Self-retaining retractor was then placed, and intraoperative x-ray was taken to confirm we were at the correct level.  Using a high-speed drill, laminotomy was completed with a partial medial facetectomy. The ligamentum flavum was then identified and removed and the lateral edge of the  thecal sac was identified. This was then traced down to identify the traversing nerve root. The posterior annulus was then incised and using a combination of dissectors, curettes, and rongeurs, the herniated disc fragment was removed. The decompression of the nerve root was confirmed using a dissector.  Hemostasis was then secured using a combination of morcellized Gelfoam and thrombin and bipolar electrocautery. The wound is irrigated with copious amounts of antibiotic saline irrigation. The nerve root was then covered with a long-acting steroid solution. Self-retaining retractor was then removed, and the wound is closed in layers using a combination of interrupted 0 Vicryl and 3-0 Vicryl stitches. The skin was closed using standard skin glue.  At the end of the case all sponge, needle, and instrument counts were correct. The patient was then transferred to the stretcher and taken to the postanesthesia care unit in stable hemodynamic condition.

## 2015-08-11 NOTE — Discharge Summary (Signed)
Physician Discharge Summary  Patient ID: Crystal Meyers MRN: [id number] DOB/AGE: 01/17/1946 70 y.o.  Admit date: 08/11/2015 Discharge date: 08/11/2015  Admission Diagnoses: Lumbar disc herniation with radiculopathy, L4-5  Discharge Diagnoses: Same Active Problems:   * No active hospital problems. *   Discharged Condition: Stable  Hospital Course:  [name] is a 70 y.o. female who presented to the clinic with left L5 radiculopathy and MRI demonstrating L4-5 disc herniation. The patient was admitted for elective left L4-5 laminotomy and microdiscectomy which was done without complication. Postoperatively, the patient was at her neurologic baseline, reporting relief of her leg pain. Back pain was controlled with oral medication, she was ambulating without difficulty, voiding normally, and tolerating diet.  Treatments: Surgery - Left L4-5 laminotomy, microdiscectomy  Discharge Exam: Blood pressure 153/72, pulse 89, temperature 97.2 F (36.2 C), temperature source Oral, resp. rate 21, weight 82.872 kg (182 lb 11.2 oz), SpO2 99 %. Awake, alert, oriented Speech fluent, appropriate CN grossly intact 5/5 BUE/BLE Wound c/d/i  Follow-up: Follow-up in my office [facility] [phone]) in 2-3 weeks  Disposition: Home     Medication List    STOP taking these medications        HYDROcodone-acetaminophen 5-325 MG tablet  Commonly known as:  NORCO/VICODIN      TAKE these medications        aspirin EC 81 MG tablet  Take 81 mg by mouth daily.     atorvastatin 40 MG tablet  Commonly known as:  LIPITOR  Take 1 tablet (40 mg total) by mouth daily.     b complex vitamins tablet  Take 1 tablet by mouth daily.     CALTRATE 600+D PO  Take 1 tablet by mouth daily.     carbamazepine 200 MG tablet  Commonly known as:  TEGRETOL  Take 200 mg by mouth 3 (three) times daily.     CENTRUM SILVER ADULT 50+ Tabs  Take by mouth.     FEMARA 2.5 MG tablet   Generic drug:  letrozole  Take 2.5 mg by mouth daily.     furosemide 20 MG tablet  Commonly known as:  LASIX  Take 1 tablet (20 mg total) by mouth daily.     gabapentin 300 MG capsule  Commonly known as:  NEURONTIN  TAKE 2 CAPSULES(600 MG) BY MOUTH FOUR TIMES DAILY     levothyroxine 75 MCG tablet  Commonly known as:  SYNTHROID, LEVOTHROID  TAKE 1 TABLET BY MOUTH EVERY DAY     lisinopril 40 MG tablet  Commonly known as:  PRINIVIL,ZESTRIL  TAKE 0.5 TABLET BY MOUTH DAILY     methocarbamol 500 MG tablet  Commonly known as:  ROBAXIN  Take 1 tablet (500 mg total) by mouth 4 (four) times daily.     omeprazole 40 MG capsule  Commonly known as:  PRILOSEC  TAKE ONE CAPSULE BY MOUTH EVERY DAY     oxyCODONE-acetaminophen 10-325 MG tablet  Commonly known as:  PERCOCET  Take 1 tablet by mouth every 4 (four) hours as needed for pain.     predniSONE 10 MG tablet  Commonly known as:  DELTASONE  6 tablets on Day 1 , then reduce by 1 tablet daily until gone     rosuvastatin 20 MG tablet  Commonly known as:  CRESTOR  Take 1 tablet (20 mg total) by mouth daily.     simvastatin 20 MG tablet  Commonly known as:  ZOCOR  TAKE 1 TABLET(20  MG) BY MOUTH AT BEDTIME     traMADol 50 MG tablet  Commonly known as:  ULTRAM  Take 1 tablet (50 mg total) by mouth every 8 (eight) hours as needed.     venlafaxine XR 150 MG 24 hr capsule  Commonly known as:  EFFEXOR-XR  TAKE 1 CAPSULE BY MOUTH EVERY DAY         Signed: Hennesy Sobalvarro, C 08/11/2015, 9:45 AM

## 2015-08-12 ENCOUNTER — Encounter (HOSPITAL_COMMUNITY): Payer: Self-pay | Admitting: Neurosurgery

## 2015-08-18 ENCOUNTER — Encounter: Payer: Self-pay | Admitting: Internal Medicine

## 2015-08-28 ENCOUNTER — Other Ambulatory Visit: Payer: Self-pay | Admitting: Family Medicine

## 2015-09-07 ENCOUNTER — Other Ambulatory Visit: Payer: Self-pay | Admitting: Internal Medicine

## 2015-09-22 DIAGNOSIS — L739 Follicular disorder, unspecified: Secondary | ICD-10-CM | POA: Diagnosis not present

## 2015-09-22 DIAGNOSIS — I789 Disease of capillaries, unspecified: Secondary | ICD-10-CM | POA: Diagnosis not present

## 2015-09-22 DIAGNOSIS — Z85828 Personal history of other malignant neoplasm of skin: Secondary | ICD-10-CM | POA: Diagnosis not present

## 2015-09-22 DIAGNOSIS — B353 Tinea pedis: Secondary | ICD-10-CM | POA: Diagnosis not present

## 2015-09-22 DIAGNOSIS — Z411 Encounter for cosmetic surgery: Secondary | ICD-10-CM | POA: Diagnosis not present

## 2015-09-22 DIAGNOSIS — L700 Acne vulgaris: Secondary | ICD-10-CM | POA: Diagnosis not present

## 2015-09-22 DIAGNOSIS — L858 Other specified epidermal thickening: Secondary | ICD-10-CM | POA: Diagnosis not present

## 2015-09-22 DIAGNOSIS — L821 Other seborrheic keratosis: Secondary | ICD-10-CM | POA: Diagnosis not present

## 2015-09-30 ENCOUNTER — Ambulatory Visit (INDEPENDENT_AMBULATORY_CARE_PROVIDER_SITE_OTHER): Payer: Medicare Other | Admitting: Family Medicine

## 2015-09-30 ENCOUNTER — Encounter: Payer: Self-pay | Admitting: Family Medicine

## 2015-09-30 ENCOUNTER — Telehealth: Payer: Self-pay | Admitting: Internal Medicine

## 2015-09-30 VITALS — BP 108/68 | HR 84 | Temp 98.1°F | Ht 66.5 in | Wt 188.8 lb

## 2015-09-30 DIAGNOSIS — R221 Localized swelling, mass and lump, neck: Secondary | ICD-10-CM | POA: Insufficient documentation

## 2015-09-30 MED ORDER — ONDANSETRON 4 MG PO TBDP: 4.0000 mg | ORAL_TABLET | Freq: Three times a day (TID) | ORAL | Status: DC | PRN

## 2015-09-30 NOTE — Progress Notes (Signed)
Subjective:  Patient ID: [name], female    DOB: December 16, 1945  Age: 70 y.o. MRN: [id number]  CC: Swelling, Left neck  HPI:  70 year old female with a collocated past medical history including tobacco abuse presents with the above complaint.  Patient developed left sided neck swelling and pain yesterday. She states that it's worse with eating. She states that the area swells when she eats. She reports associated nausea. She also reports associated fatigue and generalized malaise. Additionally, she's had some ear discomfort. No associated fever. No known relieving factors. No interventions tried. She is concerned about her symptoms and the facto that it interferes with eating.  Social Hx   Social History   Social History  . Marital Status: Widowed    Spouse Name: N/A  . Number of Children: N/A  . Years of Education: N/A   Social History Main Topics  . Smoking status: Current Every Day Smoker -- 1.00 packs/day for 50 years    Types: Cigarettes  . Smokeless tobacco: Never Used  . Alcohol Use: No  . Drug Use: No  . Sexual Activity: No   Other Topics Concern  . None   Social History Narrative   Lives with mother.   Recently widowed.   Always uses seat belts   Has a bird and a dog.   No exercise.   Review of Systems  Constitutional: Positive for fatigue. Negative for fever.  HENT: Positive for ear pain.   Gastrointestinal: Positive for nausea and vomiting.  Musculoskeletal: Positive for neck pain.   Objective:  BP 108/68 mmHg  Pulse 84  Temp(Src) 98.1 F (36.7 C) (Oral)  Ht 5' 6.5" (1.689 m)  Wt 188 lb 12.8 oz (85.639 kg)  BMI 30.02 kg/m2  SpO2 96%  BP/Weight 09/30/2015 08/11/2015 [id number]  Systolic BP [id number] 0000000 [id number]  Diastolic BP 68 74 83  Wt. (Lbs) 188.8 182.7 182.7  BMI 30.02 29.05 29.05   Physical Exam  Constitutional:  Chronically ill appearing female in NAD.  HENT:  Head: Normocephalic and atraumatic.  Mouth/Throat: Oropharynx is clear and moist.    Eyes: Conjunctivae are normal.  Neck:  Area of tenderness in the anterior L neck (in the region on the thyroid cartilage). No discrete lymphadenopathy.   Neurological: She is alert.  Psychiatric: She has a normal mood and affect.  Vitals reviewed.  Lab Results  Component Value Date   WBC 6.6 08/07/2015   HGB 13.1 08/07/2015   HCT 38.7 08/07/2015   PLT 239 08/07/2015   GLUCOSE 123* 08/07/2015   CHOL 171 02/09/2015   TRIG 149.0 02/09/2015   HDL 52.20 02/09/2015   LDLDIRECT 97.0 06/11/2015   LDLCALC 89 02/09/2015   ALT 27 06/11/2015   AST 24 06/11/2015   NA 138 08/07/2015   K 4.3 08/07/2015   CL 101 08/07/2015   CREATININE 0.99 08/07/2015   BUN 13 08/07/2015   CO2 29 08/07/2015   TSH 0.56 06/11/2015   HGBA1C 5.9 02/09/2015   MICROALBUR <0.7 02/09/2015    Assessment & Plan:   Problem List Items Addressed This Visit    Localized swelling, mass or lump of neck - Primary    New Problem. Unclear etiology. I was not able to determine the source of her swelling Isovue exam. Does not appear to be tendinopathy or thyromegaly. Obtaining ultrasound for further evaluation. Treating associated nausea with Zofran.        Relevant Orders   US Soft Tissue Head/Neck  Meds ordered this encounter  Medications  . ondansetron (ZOFRAN ODT) 4 MG disintegrating tablet    Sig: Take 1 tablet (4 mg total) by mouth every 8 (eight) hours as needed for nausea or vomiting.    Dispense:  20 tablet    Refill:  0    Follow-up: PRN  [place]

## 2015-09-30 NOTE — Telephone Encounter (Signed)
[facility]     Patient Name: Crystal Meyers Initial Comment caller states mother is afraid to eat as she is having jaw pain, gland swelling, nausea and vomiting every time she eats soemthing  DOB: Jul 07, 1946      Nurse Assessment  Nurse: Luther Parody, RN, Malachy Mood Date/Time (Eastern Time): 09/30/2015 11:03:31 AM  Confirm and document reason for call. If symptomatic, describe symptoms. You must click the next button to save text entered. ---Caller states that her mother began having jaw pain, swelling of her lymphnodes on the left side of her jaw and n/v after eating. States that this has occurred 3x since yesterday and she is now afraid to eat anything. Denies difficulty breathing.  Has the patient traveled out of the country within the last 30 days? ---Not Applicable  Does the patient have any new or worsening symptoms? ---Yes  Will a triage be completed? ---Yes  Related visit to physician within the last 2 weeks? ---No  Does the PT have any chronic conditions? (i.e. diabetes, asthma, etc.) ---Yes  List chronic conditions. ---hypothyroidism, htn, sleep apnea, copd, depression, hx of breast cancer  Is this a behavioral health or substance abuse call? ---No    Guidelines     Guideline Title Affirmed Question Affirmed Notes   Lymph Nodes Swollen [1] Single large node AND [2] size > 1 inch (2.5 cm) AND [3] no fever    Final Disposition User   See Physician within [place], RN, [employer]     Referrals   REFERRED TO PCP OFFICE   Disagree/Comply: Leta Baptist

## 2015-09-30 NOTE — Assessment & Plan Note (Signed)
New Problem. Unclear etiology. I was not able to determine the source of her swelling Isovue exam. Does not appear to be tendinopathy or thyromegaly. Obtaining ultrasound for further evaluation. Treating associated nausea with Zofran.

## 2015-09-30 NOTE — Patient Instructions (Signed)
Increase your fluid intake; Try drinking ensure or boost.  We will call as soon as we can.  Follow up closely with Dr. Derrel Nip  Take care  Dr. Lacinda Axon

## 2015-10-01 ENCOUNTER — Inpatient Hospital Stay (HOSPITAL_BASED_OUTPATIENT_CLINIC_OR_DEPARTMENT_OTHER): Payer: Medicare Other | Admitting: Oncology

## 2015-10-01 ENCOUNTER — Inpatient Hospital Stay: Payer: Medicare Other | Attending: Oncology

## 2015-10-01 ENCOUNTER — Ambulatory Visit
Admission: RE | Admit: 2015-10-01 | Discharge: 2015-10-01 | Disposition: A | Discharge status: Home / Self Care | Payer: Medicare Other | Source: Ambulatory Visit | Attending: Family Medicine | Admitting: Family Medicine

## 2015-10-01 ENCOUNTER — Encounter: Payer: Self-pay | Admitting: Oncology

## 2015-10-01 ENCOUNTER — Telehealth: Payer: Self-pay

## 2015-10-01 VITALS — BP 150/89 | HR 69 | Temp 97.2°F | Resp 18 | Wt 185.5 lb

## 2015-10-01 DIAGNOSIS — I10 Essential (primary) hypertension: Secondary | ICD-10-CM | POA: Insufficient documentation

## 2015-10-01 DIAGNOSIS — C50919 Malignant neoplasm of unspecified site of unspecified female breast: Secondary | ICD-10-CM

## 2015-10-01 DIAGNOSIS — G500 Trigeminal neuralgia: Secondary | ICD-10-CM | POA: Insufficient documentation

## 2015-10-01 DIAGNOSIS — Z9013 Acquired absence of bilateral breasts and nipples: Secondary | ICD-10-CM

## 2015-10-01 DIAGNOSIS — Z79899 Other long term (current) drug therapy: Secondary | ICD-10-CM | POA: Insufficient documentation

## 2015-10-01 DIAGNOSIS — E039 Hypothyroidism, unspecified: Secondary | ICD-10-CM | POA: Diagnosis not present

## 2015-10-01 DIAGNOSIS — R220 Localized swelling, mass and lump, head: Secondary | ICD-10-CM | POA: Diagnosis not present

## 2015-10-01 DIAGNOSIS — C50412 Malignant neoplasm of upper-outer quadrant of left female breast: Secondary | ICD-10-CM | POA: Diagnosis not present

## 2015-10-01 DIAGNOSIS — R221 Localized swelling, mass and lump, neck: Secondary | ICD-10-CM | POA: Diagnosis not present

## 2015-10-01 DIAGNOSIS — Z170 Estrogen receptor positive status [ER+]: Secondary | ICD-10-CM

## 2015-10-01 DIAGNOSIS — K219 Gastro-esophageal reflux disease without esophagitis: Secondary | ICD-10-CM | POA: Insufficient documentation

## 2015-10-01 DIAGNOSIS — J449 Chronic obstructive pulmonary disease, unspecified: Secondary | ICD-10-CM | POA: Insufficient documentation

## 2015-10-01 DIAGNOSIS — Z7982 Long term (current) use of aspirin: Secondary | ICD-10-CM | POA: Insufficient documentation

## 2015-10-01 DIAGNOSIS — F17210 Nicotine dependence, cigarettes, uncomplicated: Secondary | ICD-10-CM | POA: Diagnosis not present

## 2015-10-01 DIAGNOSIS — E7800 Pure hypercholesterolemia, unspecified: Secondary | ICD-10-CM | POA: Insufficient documentation

## 2015-10-01 LAB — COMPREHENSIVE METABOLIC PANEL
ALT: 39 U/L (ref 14–54)
AST: 39 U/L (ref 15–41)
Albumin: 4.6 g/dL (ref 3.5–5.0)
Alkaline Phosphatase: 58 U/L (ref 38–126)
Anion gap: 7 (ref 5–15)
BILIRUBIN TOTAL: 0.4 mg/dL (ref 0.3–1.2)
BUN: 16 mg/dL (ref 6–20)
CHLORIDE: 101 mmol/L (ref 101–111)
CO2: 31 mmol/L (ref 22–32)
CREATININE: 0.9 mg/dL (ref 0.44–1.00)
Calcium: 9.6 mg/dL (ref 8.9–10.3)
GFR calc Af Amer: >60 mL/min (ref >60)
GLUCOSE: 109 mg/dL — AB (ref 65–99)
Potassium: 4.8 mmol/L (ref 3.5–5.1)
Sodium: 139 mmol/L (ref 135–145)
TOTAL PROTEIN: 7.6 g/dL (ref 6.5–8.1)

## 2015-10-01 LAB — CBC WITH DIFFERENTIAL/PLATELET
BASOS ABS: 0 10*3/uL (ref 0–0.1)
Basophils Relative: 1 %
Eosinophils Absolute: 0.1 10*3/uL (ref 0–0.7)
Eosinophils Relative: 2 %
HEMATOCRIT: 38.4 % (ref 35.0–47.0)
Hemoglobin: 13.4 g/dL (ref 12.0–16.0)
LYMPHS PCT: 36 %
Lymphs Abs: 2.2 10*3/uL (ref 1.0–3.6)
MCH: 31.8 pg (ref 26.0–34.0)
MCHC: 34.8 g/dL (ref 32.0–36.0)
MCV: 91.2 fL (ref 80.0–100.0)
Monocytes Absolute: 0.5 10*3/uL (ref 0.2–0.9)
Monocytes Relative: 7 %
NEUTROS ABS: 3.3 10*3/uL (ref 1.4–6.5)
NEUTROS PCT: 54 %
Platelets: 225 10*3/uL (ref 150–440)
RBC: 4.21 MIL/uL (ref 3.80–5.20)
RDW: 12.9 % (ref 11.5–14.5)
WBC: 6.2 10*3/uL (ref 3.6–11.0)

## 2015-10-01 NOTE — Telephone Encounter (Signed)
I did NOT see this patient or order any tests

## 2015-10-01 NOTE — Telephone Encounter (Signed)
Notified pt of test results. Pt verbalized understanding 

## 2015-10-01 NOTE — Progress Notes (Signed)
[place] @ [facility] Telephone:(336) [phone]  Fax:(336) [phone]     Crystal Meyers OB: 1945-09-06  MR#: [id number]  TIW#:[id number]  Patient Care Team: [name], MD as PCP - General (Internal Medicine)  CHIEF COMPLAINT:  Chief Complaint/Diagnosis:   1. 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 Low Oncotype DX score. Diagnosed in June 2010. 2. Status post-bilateral mastectomy and reconstructive surgery. 3. Started Femara approx. 2011.[name] 4.Breast cancer index revealed a 5.2% increase of late recurrence but low likelihood of benefit with extended adjuvant therapy (March of 2017) Letrozole was discontinued from March of 2017   INTERVAL HISTORY:  70 year old lady came today further follow-up regarding a stage I C carcinoma of breast.  Appetite has improved.  Patient continues to do very depressed.  Continues to smoke.  Taking letrozole.  Occasional chest wall pain and joint pains taking calcium and vitamin D.  Getting regular mammograms done patient is approximately now finished 5 years of letrozole.  Patient states she has had back surgery since her last visit. Also having trigeminal nerve pain. Has [place] prior to this visit  7 complained of swelling of the left parotid gland.  Patient has swelling while she tries to eat.  Along with that she has some weakness and tingling and numbness in the right extremity.  (October) and according to her is all started with swelling on the labs side of the neck.  REVIEW OF SYSTEMS:    general status: Patient is feeling weak and tired.  No change in a performance status.  No chills.  No fever. HEENT: Alopecia.  No evidence of stomatitis  Swelling of the left parotid gland as described about particularly when she tries to eat.  Ultrasound has been ordered.  I primary care physician Lungs: No cough or shortness of breath chest wall pain Cardiac: No  chest pain or paroxysmal nocturnal dyspnea GI: No nausea no vomiting no diarrhea no abdominal pain Skin: No rash Lower extremity no swelling Neurological system: No tingling.  No numbness.  No other focal signs Musculoskeletal system joint pains  Depression Continues to smoke   As per HPI. Otherwise, a complete review of systems is negatve.  PAST MEDICAL HISTORY: Past Medical History  Diagnosis Date  . Depression   . Hypertension   . Hypercholesteremia   . Breast cancer ([place]) 7/10    left , invasive lobular carcinoma  . Sleep apnea   . Hypothyroidism   . Personal history of tobacco use, presenting hazards to health 05/06/2015  . Personal history of tobacco use, presenting hazards to health 05/06/2015  . COPD (chronic obstructive pulmonary disease) ([place])   . Barrett's esophagus   . Trigeminal neuralgia   . Complication of anesthesia     difficulty waking up and sleep apnea, low sats    PAST SURGICAL HISTORY: Past Surgical History  Procedure Laterality Date  . Mastectomy  05/2009    bilateral, reconstructive surgery 09/2009  . Mastectomy      Bilateral   . Breast surgery Bilateral 2011    dbl mastectomy  . Lumbar laminectomy/decompression microdiscectomy Left 08/11/2015    Procedure: Left Lumbar four-five microdiscectomy;  Surgeon: [name], MD;  Location: [facility];  Service: Neurosurgery;  Laterality: Left;    FAMILY HISTORY Family History  Problem Relation Age of Onset  . Mental illness Mother   . Diabetes Mother   . Hypertension Mother    Significant  History/PMH:   Cancer, Breast: with bilateral mastectomy   bilateral mastec:    GERD:    Restless leg syndrome:    Sleep Apnea:    Hypothyroid:    Depression:    COPD:    Hypertension:    breast biopsy:   Preventive Screening:  Has patient had any of the following test? Mammography   Last Mammography: March 2014   Smoking History: Smoking History Packs per day.  PFSH: Family  History: positive  Comments: Aunt From father's side had breast cancer in her 38.  Father had colon cancer.  History of pancreatic cancer  Social History: positive tobacco  Smoker: Refused smoking cessation counseling  Smoker- How Many Years: Many years,Still smoking  Additional Past Medical and Surgical History: Sleep apnea   Depression  Sleep disturbance,    Gastroesophageal reflux  Hypothyroidism   Hypertension    ADVANCED DIRECTIVES:  Patient does not have any living will or healthcare power of attorney.  Information was given .  Available resources had been discussed.  We will follow-up on subsequent appointments regarding this issue  HEALTH MAINTENANCE: Social History  Substance Use Topics  . Smoking status: Current Every Day Smoker -- 1.00 packs/day for 50 years    Types: Cigarettes  . Smokeless tobacco: Never Used  . Alcohol Use: No      No Known Allergies  Current Outpatient Prescriptions  Medication Sig Dispense Refill  . aspirin EC 81 MG tablet Take 81 mg by mouth daily.    [name] atorvastatin (LIPITOR) 40 MG tablet Take 1 tablet (40 mg total) by mouth daily. 90 tablet 3  . Calcium Carbonate-Vitamin D (CALTRATE 600+D PO) Take 1 tablet by mouth daily.     . carbamazepine (TEGRETOL) 200 MG tablet Take 200 mg by mouth 3 (three) times daily.    . furosemide (LASIX) 20 MG tablet Take 1 tablet (20 mg total) by mouth daily. (Patient taking differently: Take 20 mg by mouth daily as needed for fluid. ) 30 tablet 3  . letrozole (FEMARA) 2.5 MG tablet TAKE 1 TABLET BY MOUTH EVERY DAY 90 tablet 0  . levothyroxine (SYNTHROID, LEVOTHROID) 75 MCG tablet TAKE 1 TABLET BY MOUTH EVERY DAY 90 tablet 0  . lisinopril (PRINIVIL,ZESTRIL) 40 MG tablet TAKE 0.5 TABLET BY MOUTH DAILY 45 tablet 1  . methocarbamol (ROBAXIN) 500 MG tablet Take 1 tablet (500 mg total) by mouth 4 (four) times daily. 50 tablet 0  . Multiple Vitamins-Minerals (CENTRUM SILVER ADULT 50+) TABS Take by mouth.    [name]  omeprazole (PRILOSEC) 40 MG capsule TAKE ONE CAPSULE BY MOUTH EVERY DAY 90 capsule 1  . ondansetron (ZOFRAN ODT) 4 MG disintegrating tablet Take 1 tablet (4 mg total) by mouth every 8 (eight) hours as needed for nausea or vomiting. 20 tablet 0  . venlafaxine XR (EFFEXOR-XR) 150 MG 24 hr capsule TAKE 1 CAPSULE BY MOUTH EVERY DAY 90 capsule 0   No current facility-administered medications for this visit.    OBJECTIVE:  Filed Vitals:   10/01/15 1200  BP: 150/89  Pulse: 69  Temp: 97.2 F (36.2 C)  Resp: 18     Body mass index is 29.5 kg/(m^2).    ECOG FS:1 - Symptomatic but completely ambulatory  PHYSICAL EXAM: General  status: Performance status is good.  Patient has not lost significant weight HEENT: No evidence of stomatitis. Sclera and conjunctivae :: No jaundice.   pale looking Swelling of the left parotid gland.  Soft . . tenderness present.  No other palpable lymph nodes.. Lungs: Air  entry equal on both sides.  No rhonchi.  No rales.  Cardiac: Heart sounds are normal.  No pericardial rub.  No murmur. Lymphatic system: Cervical, axillary, inguinal, lymph nodes not palpable GI: Abdomen is soft.  No ascites.  Liver spleen not palpable.  No tenderness.  Bowel sounds are within normal limit Lower extremity: No edema Neurological system: Higher functions, cranial nerves intact no evidence of peripheral neuropathy. Skin: No rash.  No ecchymosis.. Examination of breast Left breast previous lumpectomy radiation therapy no evidence of recurrent disease right breast free of masses Patient had bilateral reconstructive surgery   LAB RESULTS:  CBC Latest Ref Rng 10/01/2015 08/07/2015  WBC 3.6 - 11.0 K/uL 6.2 6.6  Hemoglobin 12.0 - 16.0 g/dL 13.4 13.1  Hematocrit 35.0 - 47.0 % 38.4 38.7  Platelets 150 - 440 K/uL 225 239    Appointment on 10/01/2015  Component Date Value Ref Range Status  . WBC 10/01/2015 6.2  3.6 - 11.0 K/uL Final  . RBC 10/01/2015 4.21  3.80 - 5.20 MIL/uL Final  .  Hemoglobin 10/01/2015 13.4  12.0 - 16.0 g/dL Final  . HCT 10/01/2015 38.4  35.0 - 47.0 % Final  . MCV 10/01/2015 91.2  80.0 - 100.0 fL Final  . MCH 10/01/2015 31.8  26.0 - 34.0 pg Final  . MCHC 10/01/2015 34.8  32.0 - 36.0 g/dL Final  . RDW 10/01/2015 12.9  11.5 - 14.5 % Final  . Platelets 10/01/2015 225  150 - 440 K/uL Final  . Neutrophils Relative % 10/01/2015 54   Final  . Neutro Abs 10/01/2015 3.3  1.4 - 6.5 K/uL Final  . Lymphocytes Relative 10/01/2015 36   Final  . Lymphs Abs 10/01/2015 2.2  1.0 - 3.6 K/uL Final  . Monocytes Relative 10/01/2015 7   Final  . Monocytes Absolute 10/01/2015 0.5  0.2 - 0.9 K/uL Final  . Eosinophils Relative 10/01/2015 2   Final  . Eosinophils Absolute 10/01/2015 0.1  0 - 0.7 K/uL Final  . Basophils Relative 10/01/2015 1   Final  . Basophils Absolute 10/01/2015 0.0  0 - 0.1 K/uL Final  . Sodium 10/01/2015 139  135 - 145 mmol/L Final  . Potassium 10/01/2015 4.8  3.5 - 5.1 mmol/L Final  . Chloride 10/01/2015 101  101 - 111 mmol/L Final  . CO2 10/01/2015 31  22 - 32 mmol/L Final  . Glucose, Bld 10/01/2015 109* 65 - 99 mg/dL Final  . BUN 10/01/2015 16  6 - 20 mg/dL Final  . Creatinine, Ser 10/01/2015 0.90  0.44 - 1.00 mg/dL Final  . Calcium 10/01/2015 9.6  8.9 - 10.3 mg/dL Final  . Total Protein 10/01/2015 7.6  6.5 - 8.1 g/dL Final  . Albumin 10/01/2015 4.6  3.5 - 5.0 g/dL Final  . AST 10/01/2015 39  15 - 41 U/L Final  . ALT 10/01/2015 39  14 - 54 U/L Final  . Alkaline Phosphatase 10/01/2015 58  38 - 126 U/L Final  . Total Bilirubin 10/01/2015 0.4  0.3 - 1.2 mg/dL Final  . GFR calc non Af Amer 10/01/2015 >60  >60 mL/min Final  . GFR calc Af Amer 10/01/2015 >60  >60 mL/min Final   Comment: (NOTE) The eGFR has been calculated using the CKD EPI equation. This calculation has not been validated in all clinical situations. eGFR's persistently <60 mL/min signify possible Chronic Kidney Disease.   . Anion gap 10/01/2015 7  5 - 15 Final  STUDIES: right femur likely represents a hematopoietic rest. All imaged musculature is intact. Left knee joint effusion is incompletely visualized. There is no evidence of bursitis. Imaged musculature is intact. The right hip is unremarkable.  IMPRESSION: Negative for metastatic disease. If concern for metastatic disease persists, whole-body bone scan in 4-6 months is recommended.  Left knee joint effusion.   Electronically Signed  By: Inge Rise M.D.  On: 02/17/2015 08:53 US Soft Tissue Head/neck  10/01/2015  CLINICAL DATA:  Palpable abnormality in the left submandibular region EXAM: ULTRASOUND OF HEAD/NECK SOFT TISSUES TECHNIQUE: Ultrasound examination of the head and neck soft tissues was performed in the area of clinical concern. COMPARISON:  None. FINDINGS: Palpable abnormality in the left submandibular region measures 4.7 x 1.5 x 3.2 cm and corresponds with the submandibular gland. Scanning for comparison in the right neck shows similar findings measuring 4.7 x 1.1 x 3.5 cm. This also corresponds to the right submandibular gland. No other focal abnormality is noted. IMPRESSION: Palpable abnormality corresponds to the left submandibular gland. Similar findings are noted on the right. The patient relates that the swelling is decreased in the past day likely representing some resolution of localized inflammatory change. Continued clinical follow-up is recommended. Electronically Signed   By: Inez Catalina M.D.   On: 10/01/2015 11:04    ASSESSMENT: Carcinoma of left breast status post bilateral mastectomy reconstructive surgeryclinical evaluation there is no evidence of recurrent disease  2.  Breast cancer index was low likelihood of benefit and that adjuvant therapy.  Consideringhis poor tolerance to it we will discontinue letrozole Patient had bilateral sterile mastectomy and reconstructive surgeries or no further mammogram is been ordered.  3.  All lab data has  been reviewed 4.  Swelling of the left parotid gland most likely due to presence is stone in the parotid duct.  Ultrasound has been reviewed independently would refer patient to ENT physician for further evaluation 5.  Hypothyroidism being managed by primary care physician  The patient will be reevaluated in 6 month  Patient expressed understanding and was in agreement with this plan. She also understands that She can call clinic at any time with any questions, concerns, or complaints.    No matching staging information was found for the patient.  Forest Gleason, MD   10/01/2015 12:04 PM

## 2015-10-01 NOTE — Telephone Encounter (Signed)
Please notify of results:  US revealed enlargement of the submandibular gland. Advise brief course of antiinflammatory (Motrin for the next few days) and/or tylenol.

## 2015-10-01 NOTE — Progress Notes (Signed)
Patient states she has had back surgery since her last visit.  Also having trigeminal nerve pain.  Has [place] prior to this visit for enlarged left neck gland.

## 2015-10-01 NOTE — Telephone Encounter (Signed)
Stat Report on Crystal Meyers. Results found in Epic

## 2015-10-19 ENCOUNTER — Other Ambulatory Visit: Payer: Self-pay | Admitting: Internal Medicine

## 2015-10-19 DIAGNOSIS — K1122 Acute recurrent sialoadenitis: Secondary | ICD-10-CM | POA: Diagnosis not present

## 2015-10-26 DIAGNOSIS — G500 Trigeminal neuralgia: Secondary | ICD-10-CM | POA: Diagnosis not present

## 2015-10-26 DIAGNOSIS — Z6829 Body mass index (BMI) 29.0-29.9, adult: Secondary | ICD-10-CM | POA: Diagnosis not present

## 2015-11-02 ENCOUNTER — Other Ambulatory Visit: Payer: Self-pay | Admitting: Internal Medicine

## 2015-11-09 DIAGNOSIS — K1121 Acute sialoadenitis: Secondary | ICD-10-CM | POA: Diagnosis not present

## 2015-11-23 ENCOUNTER — Other Ambulatory Visit: Payer: Self-pay | Admitting: Family Medicine

## 2015-11-30 DIAGNOSIS — G500 Trigeminal neuralgia: Secondary | ICD-10-CM | POA: Diagnosis not present

## 2015-11-30 DIAGNOSIS — Z6829 Body mass index (BMI) 29.0-29.9, adult: Secondary | ICD-10-CM | POA: Diagnosis not present

## 2015-12-01 ENCOUNTER — Other Ambulatory Visit: Payer: Self-pay | Admitting: Neurosurgery

## 2015-12-01 DIAGNOSIS — G500 Trigeminal neuralgia: Secondary | ICD-10-CM

## 2015-12-10 ENCOUNTER — Ambulatory Visit
Admission: RE | Admit: 2015-12-10 | Discharge: 2015-12-10 | Disposition: A | Discharge status: Home / Self Care | Payer: Medicare Other | Source: Ambulatory Visit | Attending: Neurosurgery | Admitting: Neurosurgery

## 2015-12-10 DIAGNOSIS — G500 Trigeminal neuralgia: Secondary | ICD-10-CM

## 2015-12-23 DIAGNOSIS — G500 Trigeminal neuralgia: Secondary | ICD-10-CM | POA: Diagnosis not present

## 2016-01-11 NOTE — Pre-Procedure Instructions (Signed)
AHANA HANDLER  01/11/2016     Your procedure is scheduled on : Thursday January 21, 2016 7:30 AM.  Report to [facility] Admitting at 5:30 AM.  Call this number if you have problems the morning of surgery: [phone]    Remember:  Do not eat food or drink liquids after midnight.  Take these medicines the morning of surgery with A SIP OF WATER : Levothyroxine (Synthroid), Omeprazole (Prilsec), Venlafaxine (Effexor XR)   Stop taking any vitamins, herbal medications/supplements, NSAIDs, Ibuprofen, Advil, Motrin, Aleve, etc on Thursday July 6th   Bring CPAP mask the day of your surgery   Do not wear jewelry, make-up or nail polish.  Do not wear lotions, powders, or perfumes.    Do not shave 48 hours prior to surgery.    Do not bring valuables to the hospital.  [facility] is not responsible for any belongings or valuables.  Contacts, dentures or bridgework may not be worn into surgery.  Leave your suitcase in the car.  After surgery it may be brought to your room.  For patients admitted to the hospital, discharge time will be determined by your treatment team.  Patients discharged the day of surgery will not be allowed to drive home.   Name and phone number of your driver:    Special instructions:  Shower using CHG soap the night before and the morning of your surgery  Please read over the following fact sheets that you were given.

## 2016-01-13 ENCOUNTER — Encounter (HOSPITAL_COMMUNITY): Payer: Self-pay

## 2016-01-13 ENCOUNTER — Encounter (HOSPITAL_COMMUNITY)
Admission: RE | Admit: 2016-01-13 | Discharge: 2016-01-13 | Disposition: A | Discharge status: Home / Self Care | Payer: Medicare Other | Source: Ambulatory Visit | Attending: Neurosurgery | Admitting: Neurosurgery

## 2016-01-13 DIAGNOSIS — Z01812 Encounter for preprocedural laboratory examination: Secondary | ICD-10-CM | POA: Diagnosis not present

## 2016-01-13 DIAGNOSIS — Z79899 Other long term (current) drug therapy: Secondary | ICD-10-CM | POA: Insufficient documentation

## 2016-01-13 DIAGNOSIS — J449 Chronic obstructive pulmonary disease, unspecified: Secondary | ICD-10-CM | POA: Diagnosis not present

## 2016-01-13 DIAGNOSIS — Z7982 Long term (current) use of aspirin: Secondary | ICD-10-CM | POA: Insufficient documentation

## 2016-01-13 DIAGNOSIS — Z853 Personal history of malignant neoplasm of breast: Secondary | ICD-10-CM | POA: Diagnosis not present

## 2016-01-13 DIAGNOSIS — Z01818 Encounter for other preprocedural examination: Secondary | ICD-10-CM | POA: Diagnosis not present

## 2016-01-13 DIAGNOSIS — G4733 Obstructive sleep apnea (adult) (pediatric): Secondary | ICD-10-CM | POA: Diagnosis not present

## 2016-01-13 DIAGNOSIS — E7800 Pure hypercholesterolemia, unspecified: Secondary | ICD-10-CM | POA: Diagnosis not present

## 2016-01-13 DIAGNOSIS — I10 Essential (primary) hypertension: Secondary | ICD-10-CM | POA: Diagnosis not present

## 2016-01-13 DIAGNOSIS — G500 Trigeminal neuralgia: Secondary | ICD-10-CM | POA: Diagnosis not present

## 2016-01-13 DIAGNOSIS — E039 Hypothyroidism, unspecified: Secondary | ICD-10-CM | POA: Insufficient documentation

## 2016-01-13 DIAGNOSIS — F329 Major depressive disorder, single episode, unspecified: Secondary | ICD-10-CM | POA: Insufficient documentation

## 2016-01-13 HISTORY — DX: Pneumonia, unspecified organism: J18.9

## 2016-01-13 HISTORY — DX: Gastro-esophageal reflux disease without esophagitis: K21.9

## 2016-01-13 HISTORY — DX: Ventricular premature depolarization: I49.3

## 2016-01-13 LAB — BASIC METABOLIC PANEL
Anion gap: 7 (ref 5–15)
BUN: 12 mg/dL (ref 6–20)
CHLORIDE: 105 mmol/L (ref 101–111)
CO2: 27 mmol/L (ref 22–32)
Calcium: 9.7 mg/dL (ref 8.9–10.3)
Creatinine, Ser: 0.96 mg/dL (ref 0.44–1.00)
GFR calc non Af Amer: 60 mL/min — ABNORMAL LOW (ref >60)
Glucose, Bld: 107 mg/dL — ABNORMAL HIGH (ref 65–99)
POTASSIUM: 4.8 mmol/L (ref 3.5–5.1)
SODIUM: 139 mmol/L (ref 135–145)

## 2016-01-13 LAB — CBC
HEMATOCRIT: 39.6 % (ref 36.0–46.0)
HEMOGLOBIN: 13.3 g/dL (ref 12.0–15.0)
MCH: 31 pg (ref 26.0–34.0)
MCHC: 33.6 g/dL (ref 30.0–36.0)
MCV: 92.3 fL (ref 78.0–100.0)
Platelets: 230 10*3/uL (ref 150–400)
RBC: 4.29 MIL/uL (ref 3.87–5.11)
RDW: 12.2 % (ref 11.5–15.5)
WBC: 6.6 10*3/uL (ref 4.0–10.5)

## 2016-01-13 NOTE — Progress Notes (Signed)
PCP is Deborra Medina  Patient arrived to PAT accompanied by her daughter Magda Paganini.   Patient denied having any acute cardiac or pulmonary issues

## 2016-01-14 ENCOUNTER — Other Ambulatory Visit: Payer: Self-pay | Admitting: Internal Medicine

## 2016-01-14 NOTE — Progress Notes (Signed)
Anesthesia Chart Review: Patient is a 70 year old female scheduled for right trigeminal nerve microvascular decompression on 01/21/16 by Dr. Kathyrn Sheriff.  History includes smoking, hypertension, hypercholesterolemia, stage 1C left breast cancer s/p bilateral mastectomies and reconstructive surgery '10, hypothyroidism, depression, OSA with CPAP, COPD, trigeminal neuralgia, Barrett's esophagus, s/p left L4-5 microdiskectomy 08/11/15. She reported that following one of her breast surgeries at [facility], she had hypoxemia and almost had to be re-intubated. Reported being told by her PCP Dr. Deborra Medina that she had evidence of plaque in her coronary arteries. No reported chest pain, but was seen by Dr. Derrel Nip on 06/10/15 for atypical chest pain described as a "electric shock" that lasted only a second. Dr. Derrel Nip did not feel symptoms were suggestive of cardiac etiology. BMI is 28. Oncologist is Dr. Oliva Bustard.   Meds include Lipitor, ASA, levothyroxine, lisinopril, Prilosec, Effexor.  06/10/15 EKG: SR, 2 PVCs, low voltage, possible pulmonary disease.  05/11/15 CT Chest Lung ([place] screen): IMPRESSION: 1. Lung-RADS Category 2, benign appearance or behavior. Continue annual screening with low-dose chest CT without contrast in 12 months. 2. Age advanced coronary artery atherosclerosis. Recommend assessment of coronary risk factors and consideration of medical therapy. 3. Hepatic steatosis. Following results, Dr. Derrel Nip upgraded simvastatin to Lipitor, although patient is not currently taking.  Preoperative labs noted.   I reviewed her history and EKG with anesthesiologist Dr. Lissa Hoard prior to her back surgery in January. She denied any acute cardiopulmonary issues while at PAT. She tolerated neurosurgery procedure six months ago. If no acute changes then it is anticipated that she can proceed as planned.  [facility] Short Stay Center/Anesthesiology Phone [phone] 01/14/2016 9:48 AM

## 2016-01-21 ENCOUNTER — Encounter (HOSPITAL_COMMUNITY)
Admission: RE | Disposition: A | Discharge status: Home / Self Care | Payer: Self-pay | Source: Ambulatory Visit | Attending: Neurosurgery

## 2016-01-21 ENCOUNTER — Inpatient Hospital Stay (HOSPITAL_COMMUNITY): Payer: Medicare Other | Admitting: Vascular Surgery

## 2016-01-21 ENCOUNTER — Inpatient Hospital Stay (HOSPITAL_COMMUNITY)
Admission: RE | Admit: 2016-01-21 | Discharge: 2016-02-09 | DRG: 026 | Disposition: A | Discharge status: Home / Self Care | Payer: Medicare Other | Source: Ambulatory Visit | Attending: Neurosurgery | Admitting: Neurosurgery

## 2016-01-21 ENCOUNTER — Inpatient Hospital Stay (HOSPITAL_COMMUNITY): Payer: Medicare Other | Admitting: Certified Registered Nurse Anesthetist

## 2016-01-21 ENCOUNTER — Encounter (HOSPITAL_COMMUNITY): Payer: Self-pay | Admitting: *Deleted

## 2016-01-21 DIAGNOSIS — G4730 Sleep apnea, unspecified: Secondary | ICD-10-CM | POA: Diagnosis present

## 2016-01-21 DIAGNOSIS — E7800 Pure hypercholesterolemia, unspecified: Secondary | ICD-10-CM | POA: Diagnosis present

## 2016-01-21 DIAGNOSIS — F17210 Nicotine dependence, cigarettes, uncomplicated: Secondary | ICD-10-CM | POA: Diagnosis present

## 2016-01-21 DIAGNOSIS — G500 Trigeminal neuralgia: Secondary | ICD-10-CM | POA: Diagnosis not present

## 2016-01-21 DIAGNOSIS — I10 Essential (primary) hypertension: Secondary | ICD-10-CM | POA: Diagnosis present

## 2016-01-21 DIAGNOSIS — G960 Cerebrospinal fluid leak: Secondary | ICD-10-CM | POA: Diagnosis not present

## 2016-01-21 DIAGNOSIS — J449 Chronic obstructive pulmonary disease, unspecified: Secondary | ICD-10-CM | POA: Diagnosis present

## 2016-01-21 DIAGNOSIS — F329 Major depressive disorder, single episode, unspecified: Secondary | ICD-10-CM | POA: Diagnosis present

## 2016-01-21 DIAGNOSIS — G9782 Other postprocedural complications and disorders of nervous system: Secondary | ICD-10-CM

## 2016-01-21 DIAGNOSIS — Z9013 Acquired absence of bilateral breasts and nipples: Secondary | ICD-10-CM | POA: Diagnosis not present

## 2016-01-21 DIAGNOSIS — Z7982 Long term (current) use of aspirin: Secondary | ICD-10-CM

## 2016-01-21 DIAGNOSIS — K2270 Barrett's esophagus without dysplasia: Secondary | ICD-10-CM | POA: Diagnosis present

## 2016-01-21 DIAGNOSIS — K219 Gastro-esophageal reflux disease without esophagitis: Secondary | ICD-10-CM | POA: Diagnosis present

## 2016-01-21 DIAGNOSIS — E039 Hypothyroidism, unspecified: Secondary | ICD-10-CM | POA: Diagnosis present

## 2016-01-21 DIAGNOSIS — R51 Headache: Secondary | ICD-10-CM | POA: Diagnosis not present

## 2016-01-21 DIAGNOSIS — Z853 Personal history of malignant neoplasm of breast: Secondary | ICD-10-CM

## 2016-01-21 HISTORY — PX: CRANIECTOMY: SHX331

## 2016-01-21 LAB — MRSA PCR SCREENING: MRSA BY PCR: NEGATIVE

## 2016-01-21 SURGERY — CRANIECTOMY FOR TIC DOULOUREUX
Anesthesia: General | Laterality: Right

## 2016-01-21 MED ORDER — DEXAMETHASONE SODIUM PHOSPHATE 10 MG/ML IJ SOLN
INTRAMUSCULAR | Status: DC | PRN
  Administered 2016-01-21: 10 mg via INTRAVENOUS

## 2016-01-21 MED ORDER — ONDANSETRON HCL 4 MG/2ML IJ SOLN
INTRAMUSCULAR | Status: AC
  Filled 2016-01-21: qty 2

## 2016-01-21 MED ORDER — OXYCODONE HCL 5 MG PO TABS
5.0000 mg | ORAL_TABLET | Freq: Once | ORAL | Status: DC | PRN
Start: 2016-01-21 — End: 2016-01-21

## 2016-01-21 MED ORDER — ONDANSETRON HCL 4 MG PO TABS: 4.0000 mg | ORAL_TABLET | ORAL | Status: DC | PRN

## 2016-01-21 MED ORDER — MIDAZOLAM HCL 2 MG/2ML IJ SOLN
INTRAMUSCULAR | Status: AC
  Filled 2016-01-21: qty 2

## 2016-01-21 MED ORDER — ONDANSETRON HCL 4 MG/2ML IJ SOLN
4.0000 mg | INTRAMUSCULAR | Status: DC | PRN
  Administered 2016-01-21 – 2016-01-31 (×8): 4 mg via INTRAVENOUS
  Filled 2016-01-21 (×7): qty 2

## 2016-01-21 MED ORDER — ONDANSETRON HCL 4 MG/2ML IJ SOLN
INTRAMUSCULAR | Status: DC | PRN
  Administered 2016-01-21: 4 mg via INTRAVENOUS

## 2016-01-21 MED ORDER — LACTATED RINGERS IV SOLN
INTRAVENOUS | Status: DC | PRN
  Administered 2016-01-21 (×2): via INTRAVENOUS

## 2016-01-21 MED ORDER — FENTANYL CITRATE (PF) 100 MCG/2ML IJ SOLN
INTRAMUSCULAR | Status: DC | PRN
  Administered 2016-01-21: 50 ug via INTRAVENOUS
  Administered 2016-01-21: 150 ug via INTRAVENOUS
  Administered 2016-01-21 (×2): 50 ug via INTRAVENOUS

## 2016-01-21 MED ORDER — EVICEL 5 ML EX KIT
PACK | CUTANEOUS | Status: DC | PRN
  Administered 2016-01-21: 1

## 2016-01-21 MED ORDER — FENTANYL CITRATE (PF) 100 MCG/2ML IJ SOLN
INTRAMUSCULAR | Status: AC
  Filled 2016-01-21: qty 2

## 2016-01-21 MED ORDER — MIDAZOLAM HCL 5 MG/5ML IJ SOLN
INTRAMUSCULAR | Status: DC | PRN
  Administered 2016-01-21: 1 mg via INTRAVENOUS

## 2016-01-21 MED ORDER — DEXAMETHASONE SODIUM PHOSPHATE 4 MG/ML IJ SOLN
4.0000 mg | Freq: Four times a day (QID) | INTRAMUSCULAR | Status: DC
  Administered 2016-01-21 – 2016-01-22 (×5): 4 mg via INTRAVENOUS
  Filled 2016-01-21 (×5): qty 1

## 2016-01-21 MED ORDER — PROPOFOL 10 MG/ML IV BOLUS
INTRAVENOUS | Status: DC | PRN
  Administered 2016-01-21: 140 mg via INTRAVENOUS
  Administered 2016-01-21: 60 mg via INTRAVENOUS

## 2016-01-21 MED ORDER — CETYLPYRIDINIUM CHLORIDE 0.05 % MT LIQD
7.0000 mL | Freq: Two times a day (BID) | OROMUCOSAL | Status: DC
  Administered 2016-01-21: 7 mL via OROMUCOSAL

## 2016-01-21 MED ORDER — DOCUSATE SODIUM 100 MG PO CAPS
100.0000 mg | ORAL_CAPSULE | Freq: Two times a day (BID) | ORAL | Status: DC
  Administered 2016-01-21 – 2016-02-05 (×28): 100 mg via ORAL
  Filled 2016-01-21 (×32): qty 1

## 2016-01-21 MED ORDER — SUCCINYLCHOLINE CHLORIDE 200 MG/10ML IV SOSY
PREFILLED_SYRINGE | INTRAVENOUS | Status: AC
  Filled 2016-01-21: qty 10

## 2016-01-21 MED ORDER — SUGAMMADEX SODIUM 200 MG/2ML IV SOLN
INTRAVENOUS | Status: DC | PRN
  Administered 2016-01-21: 200 mg via INTRAVENOUS

## 2016-01-21 MED ORDER — CEFAZOLIN SODIUM-DEXTROSE 2-4 GM/100ML-% IV SOLN
INTRAVENOUS | Status: AC
Start: 2016-01-21 — End: 2016-01-21
  Administered 2016-01-21: 2 g via INTRAVENOUS
  Filled 2016-01-21: qty 100

## 2016-01-21 MED ORDER — THROMBIN 5000 UNITS EX SOLR
OROMUCOSAL | Status: DC | PRN
  Administered 2016-01-21: 10:00:00 via TOPICAL

## 2016-01-21 MED ORDER — FENTANYL CITRATE (PF) 250 MCG/5ML IJ SOLN
INTRAMUSCULAR | Status: AC
  Filled 2016-01-21: qty 5

## 2016-01-21 MED ORDER — PANTOPRAZOLE SODIUM 40 MG PO TBEC
40.0000 mg | DELAYED_RELEASE_TABLET | Freq: Every day | ORAL | Status: DC
  Administered 2016-01-22 – 2016-02-09 (×19): 40 mg via ORAL
  Filled 2016-01-21 (×19): qty 1

## 2016-01-21 MED ORDER — BACITRACIN ZINC 500 UNIT/GM EX OINT
TOPICAL_OINTMENT | CUTANEOUS | Status: DC | PRN
  Administered 2016-01-21: 1 via TOPICAL

## 2016-01-21 MED ORDER — FENTANYL CITRATE (PF) 100 MCG/2ML IJ SOLN
25.0000 ug | INTRAMUSCULAR | Status: DC | PRN
  Administered 2016-01-21: 25 ug via INTRAVENOUS

## 2016-01-21 MED ORDER — THROMBIN 20000 UNITS EX SOLR
CUTANEOUS | Status: DC | PRN
  Administered 2016-01-21 (×2): via TOPICAL

## 2016-01-21 MED ORDER — CHLORHEXIDINE GLUCONATE 0.12 % MT SOLN
15.0000 mL | Freq: Two times a day (BID) | OROMUCOSAL | Status: DC
  Administered 2016-01-21 (×2): 15 mL via OROMUCOSAL
  Filled 2016-01-21 (×2): qty 15

## 2016-01-21 MED ORDER — LABETALOL HCL 5 MG/ML IV SOLN
10.0000 mg | INTRAVENOUS | Status: DC | PRN
Start: 2016-01-21 — End: 2016-02-09
  Filled 2016-01-21: qty 4

## 2016-01-21 MED ORDER — ACETAMINOPHEN 325 MG PO TABS: 325.0000 mg | ORAL_TABLET | ORAL | Status: DC | PRN

## 2016-01-21 MED ORDER — OXYCODONE HCL 5 MG/5ML PO SOLN: 5.0000 mg | Freq: Once | ORAL | Status: DC | PRN

## 2016-01-21 MED ORDER — EPHEDRINE 5 MG/ML INJ
INTRAVENOUS | Status: AC
  Filled 2016-01-21: qty 10

## 2016-01-21 MED ORDER — ESMOLOL HCL 100 MG/10ML IV SOLN
INTRAVENOUS | Status: DC | PRN
  Administered 2016-01-21: 50 mg via INTRAVENOUS

## 2016-01-21 MED ORDER — MANNITOL 25 % IV SOLN
INTRAVENOUS | Status: DC | PRN
  Administered 2016-01-21: 25 g via INTRAVENOUS

## 2016-01-21 MED ORDER — SIMVASTATIN 20 MG PO TABS
20.0000 mg | ORAL_TABLET | Freq: Every day | ORAL | Status: DC
  Administered 2016-01-21 – 2016-02-08 (×17): 20 mg via ORAL
  Filled 2016-01-21 (×16): qty 1

## 2016-01-21 MED ORDER — VENLAFAXINE HCL ER 150 MG PO CP24
150.0000 mg | ORAL_CAPSULE | Freq: Every day | ORAL | Status: DC
  Administered 2016-01-22 – 2016-02-09 (×19): 150 mg via ORAL
  Filled 2016-01-21 (×20): qty 1

## 2016-01-21 MED ORDER — SODIUM CHLORIDE 0.9 % IR SOLN
Status: DC | PRN
  Administered 2016-01-21: 10:00:00

## 2016-01-21 MED ORDER — SENNA 8.6 MG PO TABS
1.0000 | ORAL_TABLET | Freq: Two times a day (BID) | ORAL | Status: DC
  Administered 2016-01-21 – 2016-02-05 (×28): 8.6 mg via ORAL
  Filled 2016-01-21 (×33): qty 1

## 2016-01-21 MED ORDER — HEMOSTATIC AGENTS (NO CHARGE) OPTIME
TOPICAL | Status: DC | PRN
  Administered 2016-01-21: 1 via TOPICAL

## 2016-01-21 MED ORDER — ROCURONIUM BROMIDE 50 MG/5ML IV SOLN
INTRAVENOUS | Status: AC
  Filled 2016-01-21: qty 1

## 2016-01-21 MED ORDER — LEVOTHYROXINE SODIUM 75 MCG PO TABS
75.0000 ug | ORAL_TABLET | Freq: Every day | ORAL | Status: DC
  Administered 2016-01-22 – 2016-02-09 (×19): 75 ug via ORAL
  Filled 2016-01-21 (×19): qty 1

## 2016-01-21 MED ORDER — LISINOPRIL 20 MG PO TABS
40.0000 mg | ORAL_TABLET | Freq: Every day | ORAL | Status: DC
  Administered 2016-01-21 – 2016-02-09 (×19): 40 mg via ORAL
  Filled 2016-01-21: qty 1
  Filled 2016-01-21: qty 2
  Filled 2016-01-21: qty 1
  Filled 2016-01-21: qty 2
  Filled 2016-01-21: qty 4
  Filled 2016-01-21 (×3): qty 2
  Filled 2016-01-21: qty 1
  Filled 2016-01-21 (×2): qty 2
  Filled 2016-01-21 (×3): qty 1
  Filled 2016-01-21: qty 2
  Filled 2016-01-21 (×4): qty 1
  Filled 2016-01-21 (×10): qty 2
  Filled 2016-01-21 (×3): qty 1
  Filled 2016-01-21: qty 2
  Filled 2016-01-21: qty 1
  Filled 2016-01-21: qty 2

## 2016-01-21 MED ORDER — HYDROMORPHONE HCL 1 MG/ML IJ SOLN
0.2500 mg | INTRAMUSCULAR | Status: DC | PRN
Start: 2016-01-21 — End: 2016-01-21

## 2016-01-21 MED ORDER — HYDROCODONE-ACETAMINOPHEN 5-325 MG PO TABS
1.0000 | ORAL_TABLET | ORAL | Status: DC | PRN
Start: 2016-01-21 — End: 2016-02-04
  Administered 2016-01-21 – 2016-01-23 (×5): 1 via ORAL
  Filled 2016-01-21 (×6): qty 1

## 2016-01-21 MED ORDER — PROMETHAZINE HCL 12.5 MG PO TABS
12.5000 mg | ORAL_TABLET | ORAL | Status: DC | PRN
  Filled 2016-01-21: qty 2

## 2016-01-21 MED ORDER — BUPIVACAINE HCL (PF) 0.5 % IJ SOLN
INTRAMUSCULAR | Status: DC | PRN
Start: 2016-01-21 — End: 2016-01-21
  Administered 2016-01-21: 8 mL

## 2016-01-21 MED ORDER — ACETAMINOPHEN 10 MG/ML IV SOLN
INTRAVENOUS | Status: AC
Start: 2016-01-21 — End: 2016-01-21
  Administered 2016-01-21: 1000 mg via INTRAVENOUS
  Filled 2016-01-21: qty 100

## 2016-01-21 MED ORDER — CEFAZOLIN SODIUM-DEXTROSE 2-4 GM/100ML-% IV SOLN
2.0000 g | Freq: Three times a day (TID) | INTRAVENOUS | Status: AC
  Administered 2016-01-21 (×2): 2 g via INTRAVENOUS
  Filled 2016-01-21 (×3): qty 100

## 2016-01-21 MED ORDER — ACETAMINOPHEN 160 MG/5ML PO SOLN
325.0000 mg | ORAL | Status: DC | PRN
  Filled 2016-01-21: qty 20.3

## 2016-01-21 MED ORDER — 0.9 % SODIUM CHLORIDE (POUR BTL) OPTIME
TOPICAL | Status: DC | PRN
  Administered 2016-01-21 (×3): 1000 mL

## 2016-01-21 MED ORDER — EVICEL 5 ML EX KIT
PACK | CUTANEOUS | Status: AC
  Filled 2016-01-21: qty 1

## 2016-01-21 MED ORDER — BISACODYL 10 MG RE SUPP: 10.0000 mg | Freq: Every day | RECTAL | Status: DC | PRN

## 2016-01-21 MED ORDER — PROPOFOL 10 MG/ML IV BOLUS
INTRAVENOUS | Status: AC
  Filled 2016-01-21: qty 20

## 2016-01-21 MED ORDER — SODIUM CHLORIDE 0.9 % IV SOLN
INTRAVENOUS | Status: DC
  Administered 2016-01-21 – 2016-01-22 (×2): via INTRAVENOUS

## 2016-01-21 MED ORDER — LIDOCAINE HCL (CARDIAC) 20 MG/ML IV SOLN
INTRAVENOUS | Status: DC | PRN
  Administered 2016-01-21: 60 mg via INTRAVENOUS

## 2016-01-21 MED ORDER — LIDOCAINE-EPINEPHRINE 1 %-1:100000 IJ SOLN
INTRAMUSCULAR | Status: DC | PRN
  Administered 2016-01-21: 8 mL

## 2016-01-21 MED ORDER — ROCURONIUM BROMIDE 100 MG/10ML IV SOLN
INTRAVENOUS | Status: DC | PRN
  Administered 2016-01-21 (×2): 10 mg via INTRAVENOUS
  Administered 2016-01-21: 50 mg via INTRAVENOUS

## 2016-01-21 MED ORDER — HYDROMORPHONE HCL 1 MG/ML IJ SOLN
0.5000 mg | INTRAMUSCULAR | Status: DC | PRN
  Administered 2016-01-21: 0.5 mg via INTRAVENOUS
  Administered 2016-01-21: 1 mg via INTRAVENOUS
  Administered 2016-01-21 – 2016-01-24 (×4): 0.5 mg via INTRAVENOUS
  Administered 2016-01-25 – 2016-01-31 (×5): 1 mg via INTRAVENOUS
  Administered 2016-01-31 (×2): 0.5 mg via INTRAVENOUS
  Administered 2016-01-31 – 2016-02-09 (×22): 1 mg via INTRAVENOUS
  Filled 2016-01-21 (×35): qty 1

## 2016-01-21 SURGICAL SUPPLY — 111 items
ADH SKN CLS APL DERMABOND .7 (GAUZE/BANDAGES/DRESSINGS) ×1
APL SKNCLS STERI-STRIP NONHPOA (GAUZE/BANDAGES/DRESSINGS)
BANDAGE GAUZE 4  KLING STR (GAUZE/BANDAGES/DRESSINGS) ×1 IMPLANT
BATTERY IQ STERILE (MISCELLANEOUS) ×1 IMPLANT
BENZOIN TINCTURE PRP APPL 2/3 (GAUZE/BANDAGES/DRESSINGS) IMPLANT
BLADE CLIPPER SURG (BLADE) ×2 IMPLANT
BLADE SAW GIGLI 16 STRL (MISCELLANEOUS) IMPLANT
BLADE SURG 11 STRL SS (BLADE) ×1 IMPLANT
BLADE SURG 15 STRL LF DISP TIS (BLADE) IMPLANT
BLADE SURG 15 STRL SS (BLADE)
BLADE ULTRA TIP 2M (BLADE) ×1 IMPLANT
BNDG GAUZE ELAST 4 BULKY (GAUZE/BANDAGES/DRESSINGS) ×1 IMPLANT
BRUSH SCRUB EZ 1% IODOPHOR (MISCELLANEOUS) ×1 IMPLANT
BTRY SRG DRVR 1.5 IQ (MISCELLANEOUS) ×1
BUR ACORN 6.0 PRECISION (BURR) ×2 IMPLANT
BUR ADDG 1.1 (BURR) IMPLANT
BUR ROUND FLUTED 4 SOFT TCH (BURR) IMPLANT
BUR SPIRAL ROUTER 2.3 (BUR) ×2 IMPLANT
CANISTER SUCT 3000ML PPV (MISCELLANEOUS) ×4 IMPLANT
CATH VENTRIC 35X38 W/TROCAR LG (CATHETERS) IMPLANT
CLIP TI MEDIUM 6 (CLIP) IMPLANT
CONT SPEC 4OZ CLIKSEAL STRL BL (MISCELLANEOUS) ×2 IMPLANT
COVER MAYO STAND STRL (DRAPES) IMPLANT
DECANTER SPIKE VIAL GLASS SM (MISCELLANEOUS) ×2 IMPLANT
DERMABOND ADVANCED (GAUZE/BANDAGES/DRESSINGS) ×1
DERMABOND ADVANCED .7 DNX12 (GAUZE/BANDAGES/DRESSINGS) IMPLANT
DRAIN SNY WOU 7FLT (WOUND CARE) IMPLANT
DRAIN SUBARACHNOID (WOUND CARE) IMPLANT
DRAPE MICROSCOPE LEICA (MISCELLANEOUS) ×1 IMPLANT
DRAPE NEUROLOGICAL W/INCISE (DRAPES) ×2 IMPLANT
DRAPE PROXIMA HALF (DRAPES) ×2 IMPLANT
DRAPE STERI IOBAN 125X83 (DRAPES) IMPLANT
DRAPE SURG 17X23 STRL (DRAPES) IMPLANT
DRAPE WARM FLUID 44X44 (DRAPE) ×2 IMPLANT
DRSG ADAPTIC 3X8 NADH LF (GAUZE/BANDAGES/DRESSINGS) ×1 IMPLANT
DRSG OPSITE 4X5.5 SM (GAUZE/BANDAGES/DRESSINGS) ×1 IMPLANT
DRSG TELFA 3X8 NADH (GAUZE/BANDAGES/DRESSINGS) IMPLANT
DURAMATRIX ONLAY 3X3 (Plate) ×1 IMPLANT
DURAPREP 6ML APPLICATOR 50/CS (WOUND CARE) ×4 IMPLANT
ELECT REM PT RETURN 9FT ADLT (ELECTROSURGICAL) ×2
ELECTRODE REM PT RTRN 9FT ADLT (ELECTROSURGICAL) ×1 IMPLANT
EVACUATOR 1/8 PVC DRAIN (DRAIN) IMPLANT
EVACUATOR SILICONE 100CC (DRAIN) IMPLANT
FEE INTRAOP MONITOR IMPULS NCS (MISCELLANEOUS) IMPLANT
FELT TEFLON 6X6 (MISCELLANEOUS) ×2 IMPLANT
FORCEPS BIPOLAR SPETZLER 8 1.0 (NEUROSURGERY SUPPLIES) ×1 IMPLANT
GAUZE SPONGE 4X4 12PLY STRL (GAUZE/BANDAGES/DRESSINGS) ×2 IMPLANT
GAUZE SPONGE 4X4 16PLY XRAY LF (GAUZE/BANDAGES/DRESSINGS) IMPLANT
GLOVE BIOGEL PI IND STRL 7.5 (GLOVE) ×1 IMPLANT
GLOVE BIOGEL PI INDICATOR 7.5 (GLOVE) ×1
GLOVE ECLIPSE 6.5 STRL STRAW (GLOVE) ×2 IMPLANT
GLOVE ECLIPSE 7.0 STRL STRAW (GLOVE) ×3 IMPLANT
GLOVE EXAM NITRILE LRG STRL (GLOVE) IMPLANT
GLOVE EXAM NITRILE MD LF STRL (GLOVE) IMPLANT
GLOVE EXAM NITRILE XL STR (GLOVE) IMPLANT
GLOVE EXAM NITRILE XS STR PU (GLOVE) IMPLANT
GOWN STRL REUS W/ TWL LRG LVL3 (GOWN DISPOSABLE) ×2 IMPLANT
GOWN STRL REUS W/ TWL XL LVL3 (GOWN DISPOSABLE) IMPLANT
GOWN STRL REUS W/TWL 2XL LVL3 (GOWN DISPOSABLE) IMPLANT
GOWN STRL REUS W/TWL LRG LVL3 (GOWN DISPOSABLE) ×4
GOWN STRL REUS W/TWL XL LVL3 (GOWN DISPOSABLE)
HEMOSTAT POWDER KIT SURGIFOAM (HEMOSTASIS) ×2 IMPLANT
HEMOSTAT SURGICEL 2X14 (HEMOSTASIS) IMPLANT
INTRAOP MONITOR FEE IMPULS NCS (MISCELLANEOUS) ×1
INTRAOP MONITOR FEE IMPULSE (MISCELLANEOUS) ×1
IV NS 1000ML (IV SOLUTION)
IV NS 1000ML BAXH (IV SOLUTION) ×1 IMPLANT
KIT BASIN OR (CUSTOM PROCEDURE TRAY) ×2 IMPLANT
KIT DRAIN CSF ACCUDRAIN (MISCELLANEOUS) IMPLANT
KIT ROOM TURNOVER OR (KITS) ×2 IMPLANT
KNIFE ARACHNOID DISP AM-24-S (MISCELLANEOUS) ×1 IMPLANT
MESH PRE-CONTOURED 40MM 0.6MM (Mesh General) ×1 IMPLANT
NDL HYPO 25X1 1.5 SAFETY (NEEDLE) ×1 IMPLANT
NDL SPNL 18GX3.5 QUINCKE PK (NEEDLE) IMPLANT
NEEDLE HYPO 25X1 1.5 SAFETY (NEEDLE) ×2 IMPLANT
NEEDLE SPNL 18GX3.5 QUINCKE PK (NEEDLE) IMPLANT
NS IRRIG 1000ML POUR BTL (IV SOLUTION) ×4 IMPLANT
PACK CRANIOTOMY (CUSTOM PROCEDURE TRAY) ×2 IMPLANT
PAD DRESSING TELFA 3X8 NADH (GAUZE/BANDAGES/DRESSINGS) IMPLANT
PAD EYE OVAL STERILE LF (GAUZE/BANDAGES/DRESSINGS) IMPLANT
PATTIES SURGICAL .25X.25 (GAUZE/BANDAGES/DRESSINGS) IMPLANT
PATTIES SURGICAL .5 X.5 (GAUZE/BANDAGES/DRESSINGS) IMPLANT
PATTIES SURGICAL .5 X3 (DISPOSABLE) IMPLANT
PATTIES SURGICAL 1/4 X 3 (GAUZE/BANDAGES/DRESSINGS) IMPLANT
PATTIES SURGICAL 1X1 (DISPOSABLE) IMPLANT
PIN MAYFIELD SKULL DISP (PIN) ×2 IMPLANT
RUBBERBAND STERILE (MISCELLANEOUS) ×2 IMPLANT
SCREW SELF DRILL HT 1.5/4MM (Screw) ×6 IMPLANT
SET TUBING W/EXT DISP (INSTRUMENTS) ×1 IMPLANT
SPECIMEN JAR SMALL (MISCELLANEOUS) ×1 IMPLANT
SPONGE NEURO XRAY DETECT 1X3 (DISPOSABLE) IMPLANT
SPONGE SURGIFOAM ABS GEL 100 (HEMOSTASIS) ×3 IMPLANT
STAPLER VISISTAT 35W (STAPLE) ×2 IMPLANT
STOCKINETTE 6  STRL (DRAPES)
STOCKINETTE 6 STRL (DRAPES) IMPLANT
SUT ETHILON 3 0 FSL (SUTURE) IMPLANT
SUT ETHILON 3 0 PS 1 (SUTURE) IMPLANT
SUT NURALON 4 0 TR CR/8 (SUTURE) ×5 IMPLANT
SUT SILK 0 TIES 10X30 (SUTURE) IMPLANT
SUT VIC AB 0 CT1 18XCR BRD8 (SUTURE) ×2 IMPLANT
SUT VIC AB 0 CT1 8-18 (SUTURE) ×6
SUT VIC AB 3-0 SH 8-18 (SUTURE) ×3 IMPLANT
SUT VICRYL 3-0 RB1 18 ABS (SUTURE) ×2 IMPLANT
TIP SONASTAR STD MISONIX 1.9 (TRAY / TRAY PROCEDURE) IMPLANT
TIP STRAIGHT 25KHZ (INSTRUMENTS) ×1 IMPLANT
TOWEL OR 17X24 6PK STRL BLUE (TOWEL DISPOSABLE) ×2 IMPLANT
TOWEL OR 17X26 10 PK STRL BLUE (TOWEL DISPOSABLE) ×2 IMPLANT
TRAY FOLEY W/METER SILVER 16FR (SET/KITS/TRAYS/PACK) ×2 IMPLANT
TUBE CONNECTING 12X1/4 (SUCTIONS) ×2 IMPLANT
UNDERPAD 30X30 INCONTINENT (UNDERPADS AND DIAPERS) ×1 IMPLANT
WATER STERILE IRR 1000ML POUR (IV SOLUTION) ×2 IMPLANT

## 2016-01-21 NOTE — Anesthesia Preprocedure Evaluation (Addendum)
Anesthesia Evaluation  Patient identified by MRN, date of birth, ID band Patient awake    Reviewed: Allergy & Precautions, NPO status , Patient's Chart, lab work & pertinent test results  Airway Mallampati: II  TM Distance: >3 FB Neck ROM: Full    Dental  (+) Teeth Intact, Dental Advisory Given   Pulmonary sleep apnea and Continuous Positive Airway Pressure Ventilation , COPD, Current Smoker,    breath sounds clear to auscultation       Cardiovascular hypertension, Pt. on medications (-) angina(-) Past MI and (-) CHF  Rhythm:Regular     Neuro/Psych Anxiety Depression  Neuromuscular disease    GI/Hepatic GERD  Medicated and Controlled,  Endo/Other  Hypothyroidism   Renal/GU      Musculoskeletal  (+) Arthritis , Osteoarthritis,    Abdominal   Peds  Hematology   Anesthesia Other Findings Trigeminal Neuroalgia  Reproductive/Obstetrics                            Anesthesia Physical Anesthesia Plan  ASA: III  Anesthesia Plan: General   Post-op Pain Management:    Induction: Intravenous  Airway Management Planned: Oral ETT  Additional Equipment: None  Intra-op Plan:   Post-operative Plan: Extubation in OR  Informed Consent: I have reviewed the patients History and Physical, chart, labs and discussed the procedure including the risks, benefits and alternatives for the proposed anesthesia with the patient or authorized representative who has indicated his/her understanding and acceptance.   Dental advisory given  Plan Discussed with: CRNA, Anesthesiologist and Surgeon  Anesthesia Plan Comments:        Anesthesia Quick Evaluation

## 2016-01-21 NOTE — Progress Notes (Signed)
Pt. Was placed on CPAP auto titrate min: 5, max: 14 with 2L O2 bled in via pt.'s nasal mask. Pt. Is tolerating CPAP well at this time without any complications.

## 2016-01-21 NOTE — Op Note (Addendum)
PREOP DIAGNOSIS:  1. Right Trigeminal Neuralgia   POSTOP DIAGNOSIS: Same  PROCEDURE: 1. Right retrosigmoid craniectomy, microvascular decompression of trigeminal nerve 2. Use of intraoperative microscope for microdissection 3. Intraoperative brainstem auditory evoked responses  SURGEON: Dr. [name], MD  ASSISTANT: Dr. Ashok Pall, MD  ANESTHESIA: General Endotracheal  EBL: 200cc  SPECIMENS: None  DRAINS: None  COMPLICATIONS: None immediate  CONDITION: Hemodynamically stable to PACU  HISTORY: Crystal Meyers is a 70 y.o. female initially presenting to the outpatient neurosurgery clinic with chronic right-sided facial pain. She had previously been on Tegretol which was effective initially, however stop working for her. She also tried Neurontin and Elavil without significant improvement. She therefore elected to proceed with surgical microvascular decompression. Risks and benefits were expanded the patient and her family in detail. After all questions were answered, informed consent was obtained.  PROCEDURE IN DETAIL: After informed consent was obtained and witnessed, the patient was brought to the operating room. After induction of general anesthesia, the patient was positioned on the operative table in the supine position with a right-sided shoulder bump in the [place] head holder. All pressure points were meticulously padded. The retroauricular skin incision was then marked out overlying the anticipated location of the transverse and sigmoid junction and prepped and draped in the usual sterile fashion.  After timeout was conducted, skin incision was infiltrated with local anesthetic with epinephrine. Baseline BAER was confirmed. Incision was then made sharply, and Bovie electrocautery was used to dissect the subcutaneous tissue and the fascial layer. Sternocleidomastoid muscle and splenius capitis was identified and elevated in the subperiosteal plane. Self-retaining  retractors were then placed. Utilizing the high-speed drill, retrosigmoid craniectomy was completed. The inferior portion of the transverse sinus, the transverse sigmoid junction, and the posterior portion of the sigmoid sinus were identified. Hemostasis on the epidural surface was achieved with morcellized Gelfoam with thrombin, as well as bipolar electrocautery.  The dura was then opened in cruciate fashion. Leaflets were tacked up with 4-0 Nurolon. The microscope was then draped sterilely and brought into the field, and the remainder of the case was done under the microscope using microdissection. Attention was initially turned inferiorly, where arachnoid of the lateral cerebellum medullary cistern was identified and opened. CSF was released to achieve excellent brain relaxation.  Attention was then turned along the petrous ridge. These seventh eighth nerve complex was identified, and superior and medial to this we identified the fifth nerve complex. Arachnoid dissection around the nerve identified a branch of the anterior inferior cerebellar artery coursing along the superior aspect of the cisternal segment of the trigeminal nerve. There did appear to be significant adhesions to the nerve, with slight indentation of the nerve itself. Using microdissection, the artery was lifted away from the trigeminal nerve. The remainder of the nerve was inspected from the root entry zone through this and cisternal segment, without any other vascular cross compression identified. A small piece of Teflon felt was then cut and placed between the artery and the trigeminal nerve. The nerve and Teflon as well as he artery was then covered with a small layer of fibrin sealant. During the course of intradural dissection, there was no change in latency of the brainstem auditory evoked responses.  At this point, the wound is irrigated with copious amounts of normal saline irrigation. A dural inlay graft was placed. The dural  leaflets were then reapproximated with 4-0 Nurolon stitches. Hemostasis on the epidural surface was again confirmed. An onlay graft was then  placed, and covered with the remainder of the fibrin sealant. Titanium plate was used for retrosigmoid cranioplasty, and was secured with standard titanium screws. The wound was again irrigated with normal saline irrigation, and closed in multiple layers using interrupted 0 and 3-0 Vicryl stitches. Skin was closed with Dermabond. The patient was then removed from the [place]field headholder and sterile dressing was applied.  At the end of the case all sponge, needle, instrument, and cottonoid counts were correct. The patient was then abated, and taken to the postanesthesia care unit in stable hemodynamic condition.

## 2016-01-21 NOTE — Anesthesia Procedure Notes (Signed)
Procedure Name: Intubation Date/Time: 01/21/2016 7:55 AM Performed by: Carney Living Pre-anesthesia Checklist: Patient identified, Emergency Drugs available, Suction available, Patient being monitored and Timeout performed Patient Re-evaluated:Patient Re-evaluated prior to inductionOxygen Delivery Method: Circle system utilized Preoxygenation: Pre-oxygenation with 100% oxygen Intubation Type: IV induction Ventilation: Mask ventilation without difficulty and Oral airway inserted - appropriate to patient size Laryngoscope Size: Mac and 4 Grade View: Grade III Tube type: Oral Tube size: 7.0 mm Number of attempts: 1 Airway Equipment and Method: Stylet Placement Confirmation: positive ETCO2 and breath sounds checked- equal and bilateral Secured at: 22 cm Tube secured with: Tape Dental Injury: Teeth and Oropharynx as per pre-operative assessment  Difficulty Due To: Difficult Airway- due to anterior larynx, Difficult Airway- due to immobile epiglottis and Difficult Airway- due to dentition Comments: DL X 1 with mac 4, unable to visualize/mobilize epiglottis, just able to see the bottom of the arytenoids with cricoid pressure, very anterior larynx. Dental bridge causes overbite. Recommend Glide Scope.

## 2016-01-21 NOTE — OR Nursing (Signed)
Pt arouses to verbal stimulation, difficulty following commands, moves all extremeties independently with gross motor intact. Facial symmetry intact. No response to painful stimuli to knuckles or sternal. Dr. Kathyrn Sheriff informed of all. Instruction recvd to continue observing, most likely due to lingering anesthesia.

## 2016-01-21 NOTE — H&P (Signed)
CC:  Right facial pain  HPI: Crystal Meyers is a 70 y.o. female for whom we have been managing her right-sided V2 trigeminal neuralgia medically. She was initially on Tegretol for a long time, which managed her pain well. At our last visit, she reported that the Tegretol stopped working and she had actually been taking fairly high doses of Neurontin for nearly a month and a half, but this was also not managing her pain very well. At that time I placed her on escalating dose of nortriptyline. She comes in today stating that despite the elevating dose, the nortriptyline was ineffective in pain relief. She is back on the Neurontin. She continues to have fairly severe paroxysms of right sided cheek and tooth pain, exacerbated by chewing. She states that although she does get these sudden attacks, she does also have a component of somewhat more constant pain in the same distribution.   PMH: Past Medical History  Diagnosis Date  . Depression   . Hypertension   . Hypercholesteremia   . Breast cancer ([place]) 7/10    left , invasive lobular carcinoma  . Hypothyroidism   . Personal history of tobacco use, presenting hazards to health 05/06/2015  . Personal history of tobacco use, presenting hazards to health 05/06/2015  . COPD (chronic obstructive pulmonary disease) ([place])   . Barrett's esophagus   . Trigeminal neuralgia   . Sleep apnea     wears CPAP nightly  . Complication of anesthesia     difficulty waking up and sleep apnea, low sats  . PVC (premature ventricular contraction)   . Pneumonia     hx of  . GERD (gastroesophageal reflux disease)     PSH: Past Surgical History  Procedure Laterality Date  . Mastectomy  05/2009    bilateral, reconstructive surgery 09/2009  . Mastectomy      Bilateral   . Lumbar laminectomy/decompression microdiscectomy Left 08/11/2015    Procedure: Left Lumbar four-five microdiscectomy;  Surgeon: [name], MD;  Location: [facility];  Service:  Neurosurgery;  Laterality: Left;  . Breast surgery Bilateral 2011    dbl mastectomy  . Colonoscopy w/ polypectomy      SH: Social History  Substance Use Topics  . Smoking status: Current Every Day Smoker -- 1.00 packs/day for 54 years    Types: Cigarettes  . Smokeless tobacco: Never Used  . Alcohol Use: No    MEDS: Prior to Admission medications   Medication Sig Start Date End Date Taking? Authorizing Provider  aspirin EC 81 MG tablet Take 81 mg by mouth daily.   Yes Historical Provider, MD  levothyroxine (SYNTHROID, LEVOTHROID) 75 MCG tablet TAKE 1 TABLET BY MOUTH EVERY DAY 01/14/16  Yes [name], MD  lisinopril (PRINIVIL,ZESTRIL) 40 MG tablet TAKE 0.5 TABLET BY MOUTH DAILY 10/15/14  Yes [name], MD  omeprazole (PRILOSEC) 40 MG capsule TAKE 1 CAPSULE BY MOUTH EVERY DAY 11/02/15  Yes [name], MD  simvastatin (ZOCOR) 20 MG tablet TAKE 1 TABLET(20 MG) BY MOUTH AT BEDTIME 01/14/16  Yes [name], MD  venlafaxine XR (EFFEXOR-XR) 150 MG 24 hr capsule TAKE 1 CAPSULE BY MOUTH EVERY DAY 07/23/15  Yes [name], MD  venlafaxine XR (EFFEXOR-XR) 150 MG 24 hr capsule TAKE 1 CAPSULE BY MOUTH EVERY DAY 01/14/16   [name], MD    ALLERGY: Allergies  Allergen Reactions  . No Known Allergies     ROS: ROS  NEUROLOGIC EXAM: Awake, alert,  oriented Memory and concentration grossly intact Speech fluent, appropriate CN grossly intact Motor exam: Upper Extremities Deltoid Bicep Tricep Grip  Right 5/5 5/5 5/5 5/5  Left 5/5 5/5 5/5 5/5   Lower Extremity IP Quad PF DF EHL  Right 5/5 5/5 5/5 5/5 5/5  Left 5/5 5/5 5/5 5/5 5/5   Sensation grossly intact to LT  IMGAING: MRI of the brain was reviewed which does not demonstrate any etiology for secondary trigeminal neuralgia. By my interpretation, there does appear to be a small branch of the superior cerebellar artery which appears to traverse the rostral portion of the cisternal segment of the trigeminal  nerve.   IMPRESSION: 70 year old woman with right V2 and V3 distribution trigeminal neuralgia. Her pain is fairly typical, although she does have some component of more consant/burning type pain.  PLAN: - Proceed with right MVD for trigeminal nerve  I have reviewed the treatment options for trigeminal neuralgia at this point with the patient and her daughter. I explained to them that trial of a fourth medication is an option, but unlikely to provide further relief. Interventional options were discussed, including percutaneous rhizotomy, stereotactic radiosurgery, and microvascular decompression. Risks of surgery were reviewed in detail, including the risk of bleeding, stroke, leading to weakness, numbness, paralysis, coma, or even death, hearing loss, facial nerve dysfunction, and the general risks of anesthesia. With an understanding of these risks, the patient elected to proceed. All questions were answered.

## 2016-01-21 NOTE — Transfer of Care (Signed)
Immediate Anesthesia Transfer of Care Note  Patient: Crystal Meyers  Procedure(s) Performed: Procedure(s): Microvascular Decompression - right trigemminal nerve (Right)  Patient Location: PACU  Anesthesia Type:General  Level of Consciousness: awake and patient cooperative  Airway & Oxygen Therapy: Patient Spontanous Breathing and Patient connected to nasal cannula oxygen  Post-op Assessment: Report given to RN, Post -op Vital signs reviewed and stable and Patient moving all extremities X 4  Post vital signs: Reviewed and stable  Last Vitals:  Filed Vitals:   01/21/16 0629 01/21/16 1137  BP: 131/97 153/91  Pulse: 70 86  Temp: 36.4 C 37.5 C  Resp: 20 19    Last Pain: There were no vitals filed for this visit.    Patients Stated Pain Goal: 4 ([id number])  Complications: No apparent anesthesia complications

## 2016-01-22 ENCOUNTER — Encounter (HOSPITAL_COMMUNITY): Payer: Self-pay | Admitting: Neurosurgery

## 2016-01-22 MED ORDER — SODIUM CHLORIDE 0.9 % IV SOLN: INTRAVENOUS | Status: DC | PRN

## 2016-01-22 MED ORDER — DEXAMETHASONE 4 MG PO TABS
4.0000 mg | ORAL_TABLET | Freq: Four times a day (QID) | ORAL | Status: AC
  Administered 2016-01-22 – 2016-01-23 (×4): 4 mg via ORAL
  Filled 2016-01-22 (×4): qty 1

## 2016-01-22 NOTE — Progress Notes (Signed)
Nurse tech ambulated pt to bathroom at 1030. When pt was in the bathroom she noted drainage pink tinged to come from nose and pt stated she heard a whoosing sound in the right ear and felt pressure and build up and hard to hear out of both ears bilaterally. Shortly after laying back down her ears popped and she was able to hear again. Made MD aware of this event and he said to continue to monitor it. At 1330 she had another episode again after ambulating to the bathroom. She had the same symptoms as before. No increase in the headache. There was no ring around the drainage. Made the doc aware as the pt was anxious about this being CSF leak as it was mentioned that it could be a risk factor with the surgery. MD stated to continue to monitor and do not move out of ICU

## 2016-01-22 NOTE — Care Management Note (Signed)
Case Management Note  Patient Details  Name: Crystal Meyers MRN: [id number] Date of Birth: [birth year]  Subjective/Objective:   Pt admitted on 01/21/16 s/p craniectomy for trigeminal neuralgia.  PTA, pt independent of ADLS; has supportive daughters.                   Action/Plan: Will follow for discharge planning as pt progresses.    Expected Discharge Date:                  Expected Discharge Plan:  Home/Self Care  In-House Referral:     Discharge planning Services  CM Consult  Post Acute Care Choice:    Choice offered to:     DME Arranged:    DME Agency:     HH Arranged:    HH Agency:     Status of Service:  In process, will continue to follow  If discussed at Long Length of Stay Meetings, dates discussed:    Additional Comments:  Reinaldo Raddle, RN, BSN  Trauma/Neuro ICU Case Manager [phone]

## 2016-01-22 NOTE — Progress Notes (Signed)
Pt seen and examined. No issues overnight. Pt does report some HA and right neck pain. No facial pain. No visual changes.  EXAM: Temp:  [97.4 F (36.3 C)-99.5 F (37.5 C)] 98.6 F (37 C) (07/14 0400) Pulse Rate:  [67-86] 77 (07/14 0700) Resp:  [9-63] 14 (07/14 0700) BP: (117-169)/(61-98) 121/61 mmHg (07/14 0700) SpO2:  [94 %-100 %] 94 % (07/14 0700) Intake/Output      07/13 0701 - 07/14 0700 07/14 0701 - 07/15 0700   I.V. (mL/kg) 3271.3 (40.7)    IV Piggyback 200    Total Intake(mL/kg) 3471.3 (43.2)    Urine (mL/kg/hr) 3545 (1.8)    Blood 200 (0.1)    Total Output 3745     Net -273.8           Awake, alert, oriented Speech fluent CN intact, face symetric  LABS: Lab Results  Component Value Date   CREATININE 0.96 01/13/2016   BUN 12 01/13/2016   NA 139 01/13/2016   K 4.8 01/13/2016   CL 105 01/13/2016   CO2 27 01/13/2016   Lab Results  Component Value Date   WBC 6.6 01/13/2016   HGB 13.3 01/13/2016   HCT 39.6 01/13/2016   MCV 92.3 01/13/2016   PLT 230 01/13/2016    IMPRESSION: - 70 y.o. female POD#1 s/p right MVD for TN (V2-V3), doing well  PLAN: - D/C foley - OOB today - Can possibly transfer to floor - Would plan on d/c headwrap tomorrow, likely home over the weekend.

## 2016-01-23 LAB — CBC
HEMATOCRIT: 37.4 % (ref 36.0–46.0)
HEMOGLOBIN: 12.7 g/dL (ref 12.0–15.0)
MCH: 30.8 pg (ref 26.0–34.0)
MCHC: 34 g/dL (ref 30.0–36.0)
MCV: 90.6 fL (ref 78.0–100.0)
Platelets: 249 10*3/uL (ref 150–400)
RBC: 4.13 MIL/uL (ref 3.87–5.11)
RDW: 12.1 % (ref 11.5–15.5)
WBC: 11.9 10*3/uL — AB (ref 4.0–10.5)

## 2016-01-23 LAB — BASIC METABOLIC PANEL
ANION GAP: 9 (ref 5–15)
BUN: 18 mg/dL (ref 6–20)
CALCIUM: 9.4 mg/dL (ref 8.9–10.3)
CO2: 25 mmol/L (ref 22–32)
CREATININE: 0.93 mg/dL (ref 0.44–1.00)
Chloride: 105 mmol/L (ref 101–111)
GFR calc non Af Amer: >60 mL/min (ref >60)
Glucose, Bld: 140 mg/dL — ABNORMAL HIGH (ref 65–99)
Potassium: 3.5 mmol/L (ref 3.5–5.1)
SODIUM: 139 mmol/L (ref 135–145)

## 2016-01-23 MED ORDER — ACETAZOLAMIDE ER 500 MG PO CP12
500.0000 mg | ORAL_CAPSULE | Freq: Two times a day (BID) | ORAL | Status: DC
  Administered 2016-01-23 – 2016-02-09 (×34): 500 mg via ORAL
  Filled 2016-01-23 (×37): qty 1

## 2016-01-23 MED ORDER — VANCOMYCIN HCL IN DEXTROSE 1-5 GM/200ML-% IV SOLN
1000.0000 mg | Freq: Two times a day (BID) | INTRAVENOUS | Status: DC
  Administered 2016-01-23 – 2016-01-27 (×10): 1000 mg via INTRAVENOUS
  Filled 2016-01-23 (×12): qty 200

## 2016-01-23 MED ORDER — ZOLPIDEM TARTRATE 5 MG PO TABS
5.0000 mg | ORAL_TABLET | Freq: Every evening | ORAL | Status: DC | PRN
  Administered 2016-01-23 – 2016-02-06 (×2): 5 mg via ORAL
  Filled 2016-01-23 (×2): qty 1

## 2016-01-23 MED ORDER — DEXTROSE 5 % IV SOLN
2.0000 g | INTRAVENOUS | Status: DC
  Administered 2016-01-23 – 2016-02-09 (×18): 2 g via INTRAVENOUS
  Filled 2016-01-23 (×18): qty 2

## 2016-01-23 NOTE — Progress Notes (Signed)
No acute events Leaking CSF from nose when bending forward AVSS Awake and alert Moving all extremities well Incision looks good Start diamox On dexamethasone Start abx

## 2016-01-23 NOTE — Progress Notes (Signed)
Nurse tech ambulated pt to bathroom at 1320, and pt noted a minimal amount of blood tinged drainage from her nose. MD aware that this has been happening. Will continue to monitor.

## 2016-01-23 NOTE — Progress Notes (Signed)
Pharmacy Antibiotic Note  Crystal Meyers is a 70 y.o. female admitted on 01/21/2016 with CSF leak.  Pharmacy has been consulted for vancomycin dosing. Pt is afebrile. No labs have been checked this admit.   Plan: - BMET and CBC now - Vancomycin 1gm IV Q12H - F/u renal fxn, C&S, clinical status and trough at [facility]  Height: 5' 6.5" (168.9 cm) Weight: 177 lb (80.287 kg) IBW/kg (Calculated) : 60.45  Temp (24hrs), Avg:98.2 F (36.8 C), Min:97.6 F (36.4 C), Max:98.7 F (37.1 C)  No results for input(s): WBC, CREATININE, LATICACIDVEN, VANCOTROUGH, VANCOPEAK, VANCORANDOM, GENTTROUGH, GENTPEAK, GENTRANDOM, TOBRATROUGH, TOBRAPEAK, TOBRARND, AMIKACINPEAK, AMIKACINTROU, AMIKACIN in the last 168 hours.  Estimated Creatinine Clearance: 61.4 mL/min (by C-G formula based on Cr of 0.96).    Allergies  Allergen Reactions  . No Known Allergies     Antimicrobials this admission: Vanc 7/15>> CTX 7/15>>  Dose adjustments this admission: N/A  Microbiology results: Pending  Thank you for allowing pharmacy to be a part of this patient's care.  Lavaya Defreitas, Rande Lawman 01/23/2016 9:07 AM

## 2016-01-24 MED ORDER — MIDAZOLAM 5 MG/ML PEDIATRIC INJ FOR INTRANASAL/SUBLINGUAL USE: 4.0000 mg | Freq: Once | INTRAMUSCULAR | Status: DC

## 2016-01-24 MED ORDER — DEXTROSE 5 % IV SOLN: 1.0000 g | INTRAVENOUS | Status: DC

## 2016-01-24 MED ORDER — MIDAZOLAM HCL 2 MG/2ML IJ SOLN
4.0000 mg | Freq: Once | INTRAMUSCULAR | Status: AC
  Administered 2016-01-24: 3 mg via INTRAVENOUS
  Administered 2016-01-24: 1 mg via INTRAVENOUS
  Filled 2016-01-24: qty 4

## 2016-01-24 NOTE — Progress Notes (Signed)
Patient still complaining of post nasal drip AVSS Awake and alert Moving all extremities Will place lumbar drain This has been discussed with the patient and her daughter and they agree to proceed.

## 2016-01-24 NOTE — Progress Notes (Signed)
During AM assessment, patient reported some drainage from nose when up to the bathroom last night. Will continue to monitor.

## 2016-01-24 NOTE — Op Note (Signed)
01/21/2016  9:44 AM  PATIENT:  Crystal Meyers  70 y.o. female  PRE-OPERATIVE DIAGNOSIS:  Post-operative CSF leak  POST-OPERATIVE DIAGNOSIS:  Same  PROCEDURE:  Placement of lumbar drain  SURGEON:  Aldean Ast, MD  ASSISTANTS: None  ANESTHESIA:   General  DRAINS: Lumbar drain   SPECIMEN:  None  INDICATION FOR PROCEDURE: Patient with post-operative CSF leak after an MVD.  She has had persistent post-nasal drip despite medical treatment.  I recommended the above procedure.  Patient understood the risks, benefits, and alternatives and potential outcomes and wished to proceed.  PROCEDURE DETAILS: The skin of the low back was prepped with chlorhexidine.  She has a prior left L4-5 microdiscectomy.  The Touhy needle was inserted on the right side at the superior edge of the incision.  The needle was advanced until the lamina was palpated.  The needle was then advanced through the intralaminar space.  There was brisk return of CSF.  The catheter was inserted to approximately 20cm.  The trochar was removed.  The drain was secured at the skin with a purse string suture and a tension loop.  She tolerated this well.  PATIENT DISPOSITION:  Remains in ICU   Delay start of Pharmacological VTE agent (>24hrs) due to surgical blood loss or risk of bleeding:  yes

## 2016-01-24 NOTE — Progress Notes (Signed)
Dr. Annette Stable aware of patient's intermittent report of H/A. Oncoming nurse also aware. Per MD, may continue with HOB adjustments for patient comfort and level of drain adjusted for goal 10 cc/hr. Patient currently resting comfortably.

## 2016-01-24 NOTE — Plan of Care (Signed)
Problem: Physical Regulation: Goal: Postoperative complications will be avoided or minimized Outcome: Progressing CSF leak; lumbar drain placed 7/16

## 2016-01-25 LAB — BASIC METABOLIC PANEL
Anion gap: 8 (ref 5–15)
BUN: 22 mg/dL — AB (ref 6–20)
CO2: 23 mmol/L (ref 22–32)
CREATININE: 1.01 mg/dL — AB (ref 0.44–1.00)
Calcium: 9.5 mg/dL (ref 8.9–10.3)
Chloride: 107 mmol/L (ref 101–111)
GFR calc Af Amer: >60 mL/min (ref >60)
GFR, EST NON AFRICAN AMERICAN: 56 mL/min — AB (ref >60)
GLUCOSE: 118 mg/dL — AB (ref 65–99)
POTASSIUM: 3.1 mmol/L — AB (ref 3.5–5.1)
Sodium: 138 mmol/L (ref 135–145)

## 2016-01-25 LAB — CBC
HEMATOCRIT: 41.3 % (ref 36.0–46.0)
Hemoglobin: 13.9 g/dL (ref 12.0–15.0)
MCH: 30.5 pg (ref 26.0–34.0)
MCHC: 33.7 g/dL (ref 30.0–36.0)
MCV: 90.8 fL (ref 78.0–100.0)
PLATELETS: 244 10*3/uL (ref 150–400)
RBC: 4.55 MIL/uL (ref 3.87–5.11)
RDW: 12.3 % (ref 11.5–15.5)
WBC: 9.4 10*3/uL (ref 4.0–10.5)

## 2016-01-25 NOTE — Anesthesia Postprocedure Evaluation (Signed)
Anesthesia Post Note  Patient: Crystal Meyers  Procedure(s) Performed: Procedure(s) (LRB): Microvascular Decompression - right trigemminal nerve (Right)  Patient location during evaluation: PACU Anesthesia Type: General Level of consciousness: awake Pain management: pain level controlled Vital Signs Assessment: post-procedure vital signs reviewed and stable Respiratory status: spontaneous breathing Cardiovascular status: stable Postop Assessment: no signs of nausea or vomiting Anesthetic complications: no    Last Vitals:  Filed Vitals:   01/25/16 1300 01/25/16 1400  BP: 122/78 135/85  Pulse: 76 82  Temp:    Resp: 22 17    Last Pain:  Filed Vitals:   01/25/16 1444  PainSc: 4                  Alarik Radu

## 2016-01-25 NOTE — Progress Notes (Signed)
RT spoke with patient about CPAP. Patients daughter places patient on CPAP when ready. RT will continue to monitor.

## 2016-01-25 NOTE — Progress Notes (Signed)
No acute events Only one episode of post-nasal drip since drain insertion Awake and alert Moves all extremities Incision looks good Drain patent Stable

## 2016-01-25 NOTE — Care Management Important Message (Signed)
Important Message  Patient Details  Name: Crystal Meyers MRN: [id number] Date of Birth: 1946/06/22   Medicare Important Message Given:  Yes    Nathen May 01/25/2016, 3:08 PM

## 2016-01-26 NOTE — Progress Notes (Signed)
Pharmacy Antibiotic Note  Crystal Meyers is a 70 y.o. female admitted on 01/21/2016 with CSF leak.  Pharmacy has been consulted for vancomycin dosing. Pt is afebrile with WBC 9.4 on 7/17.  Plan: - Continue vancomycin 1gm IV Q12H - F/u vancomycin trough on 7/19 and adjust dose as appropriate.  - F/u renal fxn, C&S, clinical status and trough at [facility].  Height: 5' 6.5" (168.9 cm) Weight: 177 lb (80.287 kg) IBW/kg (Calculated) : 60.45  Temp (24hrs), Avg:97.8 F (36.6 C), Min:97.4 F (36.3 C), Max:98.4 F (36.9 C)   Recent Labs Lab 01/23/16 1645 01/25/16 0320  WBC 11.9* 9.4  CREATININE 0.93 1.01*    Estimated Creatinine Clearance: 58.4 mL/min (by C-G formula based on Cr of 1.01).    Allergies  Allergen Reactions  . No Known Allergies     Antimicrobials this admission: Vanc 7/15>> CTX 7/15>>  Dose adjustments this admission: N/A  Microbiology results: 7/13 MRSA PCR: negative   Thank you for allowing pharmacy to be a part of this patient's care.  Demetrius Charity, PharmD Acute Care Pharmacy Resident  Pager: [phone] 01/26/2016

## 2016-01-26 NOTE — Progress Notes (Signed)
Pt seen and examined. No issues overnight. No facial pain. Has HA, "indigestion." She has not noted any fluid leakage since Sunday.  EXAM: Temp:  [97.4 F (36.3 C)-98.4 F (36.9 C)] 98.3 F (36.8 C) (07/18 0400) Pulse Rate:  [63-82] 68 (07/18 0700) Resp:  [10-23] 16 (07/18 0700) BP: (89-139)/(58-88) 104/73 mmHg (07/18 0700) SpO2:  [95 %-100 %] 100 % (07/18 0700) Intake/Output      07 /17 0701 - 07/18 0700 07/18 0701 - 07/19 0700   P.O.     IV Piggyback 450    Total Intake(mL/kg) 450 (5.6)    Urine (mL/kg/hr) 0 (0)    Drains 215 (0.1)    Total Output 215     Net +235          Urine Occurrence 3 x     Awake, alert, oriented CN intact Good strength LD in place  LABS: Lab Results  Component Value Date   CREATININE 1.01* 01/25/2016   BUN 22* 01/25/2016   NA 138 01/25/2016   K 3.1* 01/25/2016   CL 107 01/25/2016   CO2 23 01/25/2016   Lab Results  Component Value Date   WBC 9.4 01/25/2016   HGB 13.9 01/25/2016   HCT 41.3 01/25/2016   MCV 90.8 01/25/2016   PLT 244 01/25/2016    IMPRESSION: - 70 y.o. female s/p right MVD, CSF otorrhea/rhinorrhea controlled with LD 10q1  PLAN: - Will cont LD @ 10q1

## 2016-01-27 LAB — BASIC METABOLIC PANEL
ANION GAP: 7 (ref 5–15)
BUN: 24 mg/dL — ABNORMAL HIGH (ref 6–20)
CALCIUM: 9.5 mg/dL (ref 8.9–10.3)
CHLORIDE: 109 mmol/L (ref 101–111)
CO2: 21 mmol/L — AB (ref 22–32)
Creatinine, Ser: 1.12 mg/dL — ABNORMAL HIGH (ref 0.44–1.00)
GFR calc non Af Amer: 50 mL/min — ABNORMAL LOW (ref >60)
GFR, EST AFRICAN AMERICAN: 58 mL/min — AB (ref >60)
Glucose, Bld: 129 mg/dL — ABNORMAL HIGH (ref 65–99)
Potassium: 3.7 mmol/L (ref 3.5–5.1)
Sodium: 137 mmol/L (ref 135–145)

## 2016-01-27 LAB — VANCOMYCIN, TROUGH: Vancomycin Tr: 28 ug/mL (ref 15–20)

## 2016-01-27 NOTE — Progress Notes (Signed)
Pt seen and examined. No issues overnight. No clear drainage from nose yesterday with the exception of a few drops while she was sitting upright in the chair.  EXAM: Temp:  [97.4 F (36.3 C)-98.2 F (36.8 C)] 98.2 F (36.8 C) (07/19 1200) Pulse Rate:  [55-85] 85 (07/19 1600) Resp:  [10-23] 16 (07/19 1600) BP: (95-169)/(42-81) 125/68 mmHg (07/19 1600) SpO2:  [84 %-100 %] 99 % (07/19 1600) Intake/Output      07/18 0701 - 07/19 0700 07/19 0701 - 07/20 0700   IV Piggyback 450 250   Total Intake(mL/kg) 450 (5.6) 250 (3.1)   Urine (mL/kg/hr) 505 (0.3)    Drains 224 (0.1) 66 (0.1)   Total Output 729 66   Net -279 +184         Awake, alert, oriented CN intact Good strength  LABS: Lab Results  Component Value Date   CREATININE 1.12* 01/27/2016   BUN 24* 01/27/2016   NA 137 01/27/2016   K 3.7 01/27/2016   CL 109 01/27/2016   CO2 21* 01/27/2016   Lab Results  Component Value Date   WBC 9.4 01/25/2016   HGB 13.9 01/25/2016   HCT 41.3 01/25/2016   MCV 90.8 01/25/2016   PLT 244 01/25/2016    IMPRESSION: - 70 y.o. female s/p right MVD with CSF rhinorrhea, appears controlled with lumbar drainage 10q hour  PLAN: - Will keep in bed, up to 45 degrees to eat - Plan on clamping drain tomorrow.  I have reviewed the plan with the patient and her daughter. All questions were answered.

## 2016-01-27 NOTE — Progress Notes (Signed)
Pharmacy Antibiotic Note  Crystal Meyers is a 70 y.o. female admitted on 01/21/2016 with CSF leak.  Pharmacy has been consulted for vancomycin dosing. Pt is afebrile with WBC 9.4 on 7/17.  Plan: - Continue vancomycin 1gm IV Q12H - VT from 1036 on 7/19 was drawn inappropriately after the 1000 vanc infusion had already been given - F/u VT at 2130 on 7/19 - F/u renal fxn, C&S, clinical status and trough at [facility].  Height: 5' 6.5" (168.9 cm) Weight: 177 lb (80.287 kg) IBW/kg (Calculated) : 60.45  Temp (24hrs), Avg:97.8 F (36.6 C), Min:97.4 F (36.3 C), Max:98.2 F (36.8 C)   Recent Labs Lab 01/23/16 1645 01/25/16 0320 01/27/16 1036  WBC 11.9* 9.4  --   CREATININE 0.93 1.01* 1.12*  VANCOTROUGH  --   --  28*    Estimated Creatinine Clearance: 52.6 mL/min (by C-G formula based on Cr of 1.12).    Allergies  Allergen Reactions  . No Known Allergies     Antimicrobials this admission: Vanc 7/15>> CTX 7/15>>  Dose adjustments this admission: N/A  Microbiology results: 7/13 MRSA PCR: negative   Thank you for allowing pharmacy to be a part of this patient's care.  Myer Peer Grayland Ormond), PharmD  PGY1 Pharmacy Resident Pager: [phone] 01/27/2016 12:50 PM

## 2016-01-28 ENCOUNTER — Other Ambulatory Visit: Payer: Self-pay | Admitting: Internal Medicine

## 2016-01-28 LAB — BASIC METABOLIC PANEL
ANION GAP: 9 (ref 5–15)
BUN: 25 mg/dL — ABNORMAL HIGH (ref 6–20)
CALCIUM: 9.6 mg/dL (ref 8.9–10.3)
CO2: 21 mmol/L — ABNORMAL LOW (ref 22–32)
Chloride: 110 mmol/L (ref 101–111)
Creatinine, Ser: 1.18 mg/dL — ABNORMAL HIGH (ref 0.44–1.00)
GFR calc Af Amer: 54 mL/min — ABNORMAL LOW (ref >60)
GFR, EST NON AFRICAN AMERICAN: 47 mL/min — AB (ref >60)
GLUCOSE: 117 mg/dL — AB (ref 65–99)
Potassium: 3.4 mmol/L — ABNORMAL LOW (ref 3.5–5.1)
SODIUM: 140 mmol/L (ref 135–145)

## 2016-01-28 LAB — VANCOMYCIN, TROUGH: Vancomycin Tr: 26 ug/mL (ref 15–20)

## 2016-01-28 MED ORDER — VANCOMYCIN HCL 10 G IV SOLR
1500.0000 mg | INTRAVENOUS | Status: DC
  Administered 2016-01-28 – 2016-02-08 (×12): 1500 mg via INTRAVENOUS
  Filled 2016-01-28 (×13): qty 1500

## 2016-01-28 MED ORDER — FLEET ENEMA 7-19 GM/118ML RE ENEM
1.0000 | ENEMA | Freq: Once | RECTAL | Status: DC
  Filled 2016-01-28: qty 1

## 2016-01-28 NOTE — Progress Notes (Signed)
RT NOTE:  Pt will put CPAP on with family assist when ready. Pt will call if RT needed.

## 2016-01-28 NOTE — Progress Notes (Signed)
Pharmacy Antibiotic Note  Crystal Meyers is a 70 y.o. female admitted on 01/21/2016 with CSF leak.  Pharmacy has been consulted for vancomycin dosing. Pt is afebrile with WBC 9.4 on 7/17. Scr is 1.18.   Plan: - Change vancomycin to 1500mg  IV Q24H with next dose due tonight - F/u renal fxn, C&S, clinical status and trough at [facility].  Height: 5' 6.5" (168.9 cm) Weight: 177 lb (80.287 kg) IBW/kg (Calculated) : 60.45  Temp (24hrs), Avg:98.1 F (36.7 C), Min:97.7 F (36.5 C), Max:98.6 F (37 C)   Recent Labs Lab 01/23/16 1645 01/25/16 0320 01/27/16 1036 01/28/16 0340 01/28/16 1037  WBC 11.9* 9.4  --   --   --   CREATININE 0.93 1.01* 1.12* 1.18*  --   VANCOTROUGH  --   --  28*  --  26*    Estimated Creatinine Clearance: 50 mL/min (by C-G formula based on Cr of 1.18).    Allergies  Allergen Reactions  . No Known Allergies     Antimicrobials this admission: Vanc 7/15>> CTX 7/15>>  Dose adjustments this admission: 7/20 VT = 26 on 1gm IV Q12H, changed to 1500mg  IV Q24H  Microbiology results: 7/13 MRSA PCR: negative   Thank you for allowing pharmacy to be a part of this patient's care.  Salome Arnt, PharmD, BCPS Pager # [phone] 01/28/2016 11:52 AM

## 2016-01-28 NOTE — Care Management Important Message (Signed)
Important Message  Patient Details  Name: Crystal Meyers MRN: [id number] Date of Birth: 1946/04/25   Medicare Important Message Given:  Yes    Nathen May 01/28/2016, 10:58 AM

## 2016-01-28 NOTE — Progress Notes (Signed)
Pt seen and examined. No issues overnight. May have had some drainage into back of throat yesterday one time.  EXAM: Temp:  [97.8 F (36.6 C)-98.6 F (37 C)] 97.8 F (36.6 C) (07/20 0818) Pulse Rate:  [55-87] 78 (07/20 0900) Resp:  [13-23] 19 (07/20 0900) BP: (80-133)/(48-101) 130/77 mmHg (07/20 0900) SpO2:  [93 %-100 %] 99 % (07/20 0900) Intake/Output      07/19 0701 - 07/20 0700 07/20 0701 - 07/21 0700   IV Piggyback 450    Total Intake(mL/kg) 450 (5.6)    Urine (mL/kg/hr) 0 (0)    Drains 216 (0.1) 25 (0.1)   Stool  0 (0)   Total Output 216 25   Net +234 -25        Urine Occurrence 1 x    Stool Occurrence  1 x    Awake, alert, oriented CN intact Good strength  LABS: Lab Results  Component Value Date   CREATININE 1.18* 01/28/2016   BUN 25* 01/28/2016   NA 140 01/28/2016   K 3.4* 01/28/2016   CL 110 01/28/2016   CO2 21* 01/28/2016   Lab Results  Component Value Date   WBC 9.4 01/25/2016   HGB 13.9 01/25/2016   HCT 41.3 01/25/2016   MCV 90.8 01/25/2016   PLT 244 01/25/2016    IMPRESSION: - 70 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 If she has no leakage over 24-48 hours, would remove LD. If she does leak again, we discussed opening the drain back up at 15cc q hour for another few days and trial clamp again on Monday. If she fails that, we would then re-explore and repair the leak, occlude the air cells.  I have discussed the above plan with the patient and her daughter. All questions were answered.

## 2016-01-29 NOTE — Progress Notes (Signed)
Pt with continued lumbar drain over the weekend, per MD notes.  Pt continues on IV Rocephin and Vancomycin.  Will follow for discharge planning needs as pt progresses.    Reinaldo Raddle, RN, BSN  Trauma/Neuro ICU Case Manager [phone]

## 2016-01-29 NOTE — Progress Notes (Signed)
Post nasal drip recurred Re-open drain at 15cc/hr Drain through the weekend

## 2016-01-29 NOTE — Progress Notes (Signed)
RT NOTE:  Pt puts on CPAP with family help when ready. Will call if assistance needed.

## 2016-01-30 LAB — BASIC METABOLIC PANEL
ANION GAP: 7 (ref 5–15)
BUN: 20 mg/dL (ref 6–20)
CALCIUM: 8.9 mg/dL (ref 8.9–10.3)
CO2: 18 mmol/L — AB (ref 22–32)
Chloride: 115 mmol/L — ABNORMAL HIGH (ref 101–111)
Creatinine, Ser: 1.11 mg/dL — ABNORMAL HIGH (ref 0.44–1.00)
GFR, EST AFRICAN AMERICAN: 58 mL/min — AB (ref >60)
GFR, EST NON AFRICAN AMERICAN: 50 mL/min — AB (ref >60)
Glucose, Bld: 118 mg/dL — ABNORMAL HIGH (ref 65–99)
Potassium: 3.1 mmol/L — ABNORMAL LOW (ref 3.5–5.1)
SODIUM: 140 mmol/L (ref 135–145)

## 2016-01-30 LAB — CBC
HCT: 37.2 % (ref 36.0–46.0)
HEMOGLOBIN: 12.5 g/dL (ref 12.0–15.0)
MCH: 30 pg (ref 26.0–34.0)
MCHC: 33.6 g/dL (ref 30.0–36.0)
MCV: 89.4 fL (ref 78.0–100.0)
PLATELETS: 208 10*3/uL (ref 150–400)
RBC: 4.16 MIL/uL (ref 3.87–5.11)
RDW: 12.4 % (ref 11.5–15.5)
WBC: 6.8 10*3/uL (ref 4.0–10.5)

## 2016-01-30 NOTE — Progress Notes (Signed)
RT NOTE:  Pt has CPAP setup @ bedside. Familly helps put CPAP on.

## 2016-01-30 NOTE — Progress Notes (Signed)
No acute events Post nasal drip resolved Neuro intact Wound looks good Continue CSF diversion

## 2016-01-31 NOTE — Progress Notes (Signed)
Intermittent post nasal drip yesterday None overnight Awake and alert Moving all extremities well Incision looks good Continue CSF diversion

## 2016-01-31 NOTE — Progress Notes (Signed)
Pharmacy Antibiotic Note  Crystal Meyers is a 70 y.o. female admitted on 01/21/2016 with CSF leak.  Pharmacy has been consulted for vancomycin dosing.  Plan: - Continue vanc at 1500mg  IV Q24H  - Monitor renal function, C&S, CBC and LOT - VT prn and BMP q3 days - F/u duration of therapy  Height: 5' 6.5" (168.9 cm) Weight: 177 lb (80.3 kg) IBW/kg (Calculated) : 60.45  Temp (24hrs), Avg:98.6 F (37 C), Min:98 F (36.7 C), Max:99.3 F (37.4 C)   Recent Labs Lab 01/25/16 0320 01/27/16 1036 01/28/16 0340 01/28/16 1037 01/30/16 0227  WBC 9.4  --   --   --  6.8  CREATININE 1.01* 1.12* 1.18*  --  1.11*  VANCOTROUGH  --  28*  --  26*  --     Estimated Creatinine Clearance: 53.1 mL/min (by C-G formula based on SCr of 1.11 mg/dL).    Allergies  Allergen Reactions  . No Known Allergies     Antimicrobials this admission: Vanc 7/15>> CTX 7/15>>  Dose adjustments this admission: 7/20 VT = 26 on 1gm IV Q12H, changed to 1500mg  IV Q24H  Microbiology results: 7/13 MRSA PCR: negative   Thank you for allowing pharmacy to be a part of this patient's care.  Dimitri Ped, PharmD. PGY-2 Pharmacy Resident Pager: [phone] 01/31/2016 1:23 PM

## 2016-01-31 NOTE — Progress Notes (Signed)
Pt has CPAP set up at bedside and family will help place on.

## 2016-02-01 LAB — VANCOMYCIN, TROUGH: Vancomycin Tr: 15 ug/mL (ref 15–20)

## 2016-02-01 NOTE — Care Management Important Message (Signed)
Important Message  Patient Details  Name: Crystal Meyers MRN: [id number] Date of Birth: September 10, 1945   Medicare Important Message Given:  Yes    [name] 02/01/2016, 11:03 AM

## 2016-02-01 NOTE — Progress Notes (Signed)
Pharmacy Antibiotic Note  Crystal Meyers is a 70 y.o. female admitted on 01/21/2016 with CSF leak.  Pharmacy has been consulted for vancomycin dosing.  Trough tonight within goal range  Plan: - Continue vanc at 1500mg  IV Q24H   Height: 5' 6.5" (168.9 cm) Weight: 177 lb (80.3 kg) IBW/kg (Calculated) : 60.45  Temp (24hrs), Avg:98.4 F (36.9 C), Min:97.4 F (36.3 C), Max:99.1 F (37.3 C)   Recent Labs Lab 01/27/16 1036 01/28/16 0340 01/28/16 1037 01/30/16 0227 02/01/16 2217  WBC  --   --   --  6.8  --   CREATININE 1.12* 1.18*  --  1.11*  --   VANCOTROUGH 28*  --  26*  --  15    Estimated Creatinine Clearance: 53.1 mL/min (by C-G formula based on SCr of 1.11 mg/dL).    Allergies  Allergen Reactions  . No Known Allergies     Antimicrobials this admission: Vanc 7/15>> CTX 7/15>>  Dose adjustments this admission: 7/20 VT = 26 on 1gm IV Q12H, changed to 1500mg  IV Q24H  Microbiology results: 7/13 MRSA PCR: negative   Phillis Knack, PharmD, BCPS  02/01/2016 11:34 PM

## 2016-02-01 NOTE — Progress Notes (Signed)
No acute events Patient not sure if drainage has stopped Clamp drain NPO p MN

## 2016-02-01 NOTE — Progress Notes (Signed)
Patient complaining of post-nasal drip and headache. Dr. Cyndy Freeze paged. Drain re-opened with a goal of 15 mL/hr. NPO at midnight. Will continue to monitor. Thayer Ohm D

## 2016-02-02 LAB — BASIC METABOLIC PANEL
Anion gap: 7 (ref 5–15)
BUN: 18 mg/dL (ref 6–20)
CHLORIDE: 112 mmol/L — AB (ref 101–111)
CO2: 19 mmol/L — AB (ref 22–32)
CREATININE: 1.02 mg/dL — AB (ref 0.44–1.00)
Calcium: 8.9 mg/dL (ref 8.9–10.3)
GFR calc Af Amer: >60 mL/min (ref >60)
GFR calc non Af Amer: 56 mL/min — ABNORMAL LOW (ref >60)
Glucose, Bld: 125 mg/dL — ABNORMAL HIGH (ref 65–99)
POTASSIUM: 2.9 mmol/L — AB (ref 3.5–5.1)
Sodium: 138 mmol/L (ref 135–145)

## 2016-02-02 MED ORDER — POTASSIUM CHLORIDE CRYS ER 20 MEQ PO TBCR
40.0000 meq | EXTENDED_RELEASE_TABLET | Freq: Once | ORAL | Status: AC
  Administered 2016-02-02: 40 meq via ORAL
  Filled 2016-02-02: qty 2

## 2016-02-02 NOTE — Progress Notes (Signed)
Patient has home CPAP and can place self on and off.  

## 2016-02-02 NOTE — Progress Notes (Signed)
Patient has home CPAP and places self on and off.  

## 2016-02-02 NOTE — Progress Notes (Signed)
Pt with continued CSF leak s/p craniectomy on 01/21/16.  Planning surgical intervention today, per report, for repair of CSF leak.  Will continue to follow progress.    Reinaldo Raddle, RN, BSN  Trauma/Neuro ICU Case Manager [phone]

## 2016-02-02 NOTE — Progress Notes (Signed)
Pt seen and examined. No issues overnight. Has HA, does cont to report post-nasal drainage.  EXAM: Temp:  [97.7 F (36.5 C)-99.1 F (37.3 C)] 98.5 F (36.9 C) (07/25 1600) Pulse Rate:  [62-78] 71 (07/25 1600) Resp:  [9-19] 17 (07/25 1600) BP: (106-172)/(54-123) 129/83 (07/25 1600) SpO2:  [96 %-100 %] 100 % (07/25 1600) Intake/Output      07/24 0701 - 07/25 0700 07/25 0701 - 07/26 0700   P.O. 200    I.V. (mL/kg) 80 (1)    IV Piggyback 500    Total Intake(mL/kg) 780 (9.7)    Urine (mL/kg/hr) 0 (0) 0 (0)   Drains 230 (0.1) 94 (0.1)   Total Output 230 94   Net +550 -94        Urine Occurrence 2 x 3 x    Awake, alert, oriented Speech fluent Good strength LD in place, straw colored CSF  LABS: Lab Results  Component Value Date   CREATININE 1.02 (H) 02/02/2016   BUN 18 02/02/2016   NA 138 02/02/2016   K 2.9 (L) 02/02/2016   CL 112 (H) 02/02/2016   CO2 19 (L) 02/02/2016   Lab Results  Component Value Date   WBC 6.8 01/30/2016   HGB 12.5 01/30/2016   HCT 37.2 01/30/2016   MCV 89.4 01/30/2016   PLT 208 01/30/2016    IMPRESSION: - 70 y.o. female s/p MVD with continued CSF leak despite lumbar drainage  PLAN: - Will plan on operative CSF leak repair with abd fat grafting tomorrow.  I have reviewed the situation with the patient and her daughter. Details of the surgery, risks, benefits, and alternatives were discussed. All questions were answered. They are willing to proceed as above.

## 2016-02-02 NOTE — Progress Notes (Signed)
   02/02/16 0905  Clinical Encounter Type  Visited With Patient  Visit Type Spiritual support;Social support  Spiritual Encounters  Spiritual Needs Prayer  On morning rounds Chaplain prayed for patient.

## 2016-02-03 ENCOUNTER — Inpatient Hospital Stay (HOSPITAL_COMMUNITY): Payer: Medicare Other | Admitting: Anesthesiology

## 2016-02-03 ENCOUNTER — Encounter (HOSPITAL_COMMUNITY)
Admission: RE | Disposition: A | Discharge status: Home / Self Care | Payer: Self-pay | Source: Ambulatory Visit | Attending: Neurosurgery

## 2016-02-03 ENCOUNTER — Encounter (HOSPITAL_COMMUNITY): Payer: Self-pay | Admitting: Anesthesiology

## 2016-02-03 HISTORY — PX: CRANIOTOMY: SHX93

## 2016-02-03 LAB — BASIC METABOLIC PANEL
Anion gap: 5 (ref 5–15)
BUN: 15 mg/dL (ref 6–20)
CHLORIDE: 113 mmol/L — AB (ref 101–111)
CO2: 19 mmol/L — ABNORMAL LOW (ref 22–32)
CREATININE: 1.01 mg/dL — AB (ref 0.44–1.00)
Calcium: 9.1 mg/dL (ref 8.9–10.3)
GFR, EST NON AFRICAN AMERICAN: 56 mL/min — AB (ref >60)
Glucose, Bld: 107 mg/dL — ABNORMAL HIGH (ref 65–99)
POTASSIUM: 3.6 mmol/L (ref 3.5–5.1)
SODIUM: 137 mmol/L (ref 135–145)

## 2016-02-03 SURGERY — CRANIOTOMY HEMATOMA EVACUATION SUBDURAL
Anesthesia: General | Laterality: Right

## 2016-02-03 MED ORDER — HYDROMORPHONE HCL 1 MG/ML IJ SOLN: 0.2500 mg | INTRAMUSCULAR | Status: DC | PRN

## 2016-02-03 MED ORDER — SUGAMMADEX SODIUM 200 MG/2ML IV SOLN
INTRAVENOUS | Status: DC | PRN
  Administered 2016-02-03: 160 mg via INTRAVENOUS

## 2016-02-03 MED ORDER — PHENYLEPHRINE 40 MCG/ML (10ML) SYRINGE FOR IV PUSH (FOR BLOOD PRESSURE SUPPORT)
PREFILLED_SYRINGE | INTRAVENOUS | Status: AC
  Filled 2016-02-03: qty 10

## 2016-02-03 MED ORDER — ROCURONIUM BROMIDE 50 MG/5ML IV SOLN
INTRAVENOUS | Status: AC
  Filled 2016-02-03: qty 1

## 2016-02-03 MED ORDER — SODIUM CHLORIDE 0.9% FLUSH
10.0000 mL | Freq: Two times a day (BID) | INTRAVENOUS | Status: DC
  Administered 2016-02-03 – 2016-02-05 (×4): 10 mL
  Administered 2016-02-05: 20 mL
  Administered 2016-02-06: 10 mL
  Administered 2016-02-06: 20 mL
  Administered 2016-02-07 – 2016-02-09 (×3): 10 mL

## 2016-02-03 MED ORDER — KETOROLAC TROMETHAMINE 30 MG/ML IJ SOLN
30.0000 mg | Freq: Once | INTRAMUSCULAR | Status: AC
  Administered 2016-02-03: 30 mg via INTRAVENOUS

## 2016-02-03 MED ORDER — LIDOCAINE 2% (20 MG/ML) 5 ML SYRINGE
INTRAMUSCULAR | Status: AC
  Filled 2016-02-03: qty 5

## 2016-02-03 MED ORDER — KETOROLAC TROMETHAMINE 30 MG/ML IJ SOLN
INTRAMUSCULAR | Status: AC
  Filled 2016-02-03: qty 1

## 2016-02-03 MED ORDER — LIDOCAINE 2% (20 MG/ML) 5 ML SYRINGE
INTRAMUSCULAR | Status: DC | PRN
  Administered 2016-02-03: 60 mg via INTRAVENOUS

## 2016-02-03 MED ORDER — PROPOFOL 10 MG/ML IV BOLUS
INTRAVENOUS | Status: DC | PRN
Start: 2016-02-03 — End: 2016-02-03
  Administered 2016-02-03: 160 mg via INTRAVENOUS

## 2016-02-03 MED ORDER — 0.9 % SODIUM CHLORIDE (POUR BTL) OPTIME
TOPICAL | Status: DC | PRN
  Administered 2016-02-03: 1000 mL

## 2016-02-03 MED ORDER — ROCURONIUM BROMIDE 100 MG/10ML IV SOLN
INTRAVENOUS | Status: DC | PRN
  Administered 2016-02-03: 50 mg via INTRAVENOUS

## 2016-02-03 MED ORDER — EPHEDRINE 5 MG/ML INJ
INTRAVENOUS | Status: AC
  Filled 2016-02-03: qty 10

## 2016-02-03 MED ORDER — EVICEL 5 ML EX KIT
PACK | CUTANEOUS | Status: AC
  Filled 2016-02-03: qty 1

## 2016-02-03 MED ORDER — SODIUM CHLORIDE 0.9 % IJ SOLN
INTRAMUSCULAR | Status: AC
  Filled 2016-02-03: qty 10

## 2016-02-03 MED ORDER — FENTANYL CITRATE (PF) 250 MCG/5ML IJ SOLN
INTRAMUSCULAR | Status: DC | PRN
  Administered 2016-02-03: 50 ug via INTRAVENOUS
  Administered 2016-02-03: 100 ug via INTRAVENOUS

## 2016-02-03 MED ORDER — CEFAZOLIN SODIUM 1 G IJ SOLR
INTRAMUSCULAR | Status: AC
  Filled 2016-02-03: qty 20

## 2016-02-03 MED ORDER — PHENYLEPHRINE HCL 10 MG/ML IJ SOLN
INTRAVENOUS | Status: DC | PRN
  Administered 2016-02-03: 50 ug/min via INTRAVENOUS

## 2016-02-03 MED ORDER — MEPERIDINE HCL 25 MG/ML IJ SOLN: 6.2500 mg | INTRAMUSCULAR | Status: DC | PRN

## 2016-02-03 MED ORDER — SODIUM CHLORIDE 0.9% FLUSH: 10.0000 mL | INTRAVENOUS | Status: DC | PRN

## 2016-02-03 MED ORDER — SUCCINYLCHOLINE CHLORIDE 200 MG/10ML IV SOSY
PREFILLED_SYRINGE | INTRAVENOUS | Status: AC
  Filled 2016-02-03: qty 10

## 2016-02-03 MED ORDER — HEMOSTATIC AGENTS (NO CHARGE) OPTIME
TOPICAL | Status: DC | PRN
  Administered 2016-02-03: 1 via TOPICAL

## 2016-02-03 MED ORDER — SODIUM CHLORIDE 0.9 % IR SOLN
Status: DC | PRN
  Administered 2016-02-03: 19:00:00

## 2016-02-03 MED ORDER — SUGAMMADEX SODIUM 200 MG/2ML IV SOLN
INTRAVENOUS | Status: AC
  Filled 2016-02-03: qty 2

## 2016-02-03 MED ORDER — LACTATED RINGERS IV SOLN
INTRAVENOUS | Status: DC | PRN
  Administered 2016-02-03 (×2): via INTRAVENOUS

## 2016-02-03 MED ORDER — ONDANSETRON HCL 4 MG/2ML IJ SOLN
INTRAMUSCULAR | Status: AC
  Filled 2016-02-03: qty 2

## 2016-02-03 MED ORDER — PROPOFOL 10 MG/ML IV BOLUS
INTRAVENOUS | Status: AC
  Filled 2016-02-03: qty 20

## 2016-02-03 MED ORDER — FENTANYL CITRATE (PF) 250 MCG/5ML IJ SOLN
INTRAMUSCULAR | Status: AC
  Filled 2016-02-03: qty 5

## 2016-02-03 MED ORDER — PROMETHAZINE HCL 25 MG/ML IJ SOLN: 6.2500 mg | INTRAMUSCULAR | Status: DC | PRN

## 2016-02-03 MED ORDER — THROMBIN 5000 UNITS EX SOLR
OROMUCOSAL | Status: DC | PRN
  Administered 2016-02-03: 19:00:00 via TOPICAL

## 2016-02-03 MED ORDER — HEMOSTATIC AGENTS (NO CHARGE) OPTIME
TOPICAL | Status: DC | PRN
  Administered 2016-02-03 (×2): 1 via TOPICAL

## 2016-02-03 SURGICAL SUPPLY — 60 items
ADH SKN CLS APL DERMABOND .7 (GAUZE/BANDAGES/DRESSINGS) ×2
APL SKNCLS STERI-STRIP NONHPOA (GAUZE/BANDAGES/DRESSINGS)
BATTERY IQ STERILE (MISCELLANEOUS) ×1 IMPLANT
BENZOIN TINCTURE PRP APPL 2/3 (GAUZE/BANDAGES/DRESSINGS) IMPLANT
BLADE CLIPPER SURG (BLADE) ×2 IMPLANT
BNDG GAUZE ELAST 4 BULKY (GAUZE/BANDAGES/DRESSINGS) IMPLANT
BTRY SRG DRVR 1.5 IQ (MISCELLANEOUS) ×1
CANISTER SUCT 3000ML PPV (MISCELLANEOUS) ×2 IMPLANT
DERMABOND ADVANCED (GAUZE/BANDAGES/DRESSINGS) ×2
DERMABOND ADVANCED .7 DNX12 (GAUZE/BANDAGES/DRESSINGS) IMPLANT
DRAPE LAPAROTOMY 100X72 PEDS (DRAPES) ×1 IMPLANT
DRAPE NEUROLOGICAL W/INCISE (DRAPES) ×2 IMPLANT
DRAPE ORTHO SPLIT 77X108 STRL (DRAPES) ×2
DRAPE SURG ORHT 6 SPLT 77X108 (DRAPES) IMPLANT
DRAPE WARM FLUID 44X44 (DRAPE) ×2 IMPLANT
DURAMATRIX ONLAY 2X2 (Neuro Prosthesis/Implant) ×1 IMPLANT
DURAPREP 6ML APPLICATOR 50/CS (WOUND CARE) ×2 IMPLANT
ELECT REM PT RETURN 9FT ADLT (ELECTROSURGICAL) ×2
ELECTRODE REM PT RTRN 9FT ADLT (ELECTROSURGICAL) ×1 IMPLANT
EVICEL 5ML SEALNT HUMAN B (Miscellaneous) ×1 IMPLANT
GAUZE SPONGE 4X4 12PLY STRL (GAUZE/BANDAGES/DRESSINGS) ×2 IMPLANT
GAUZE SPONGE 4X4 16PLY XRAY LF (GAUZE/BANDAGES/DRESSINGS) IMPLANT
GLOVE BIO SURGEON STRL SZ 6.5 (GLOVE) ×1 IMPLANT
GLOVE BIO SURGEON STRL SZ7 (GLOVE) ×1 IMPLANT
GLOVE BIOGEL PI IND STRL 7.5 (GLOVE) ×1 IMPLANT
GLOVE BIOGEL PI INDICATOR 7.5 (GLOVE) ×2
GLOVE ECLIPSE 6.5 STRL STRAW (GLOVE) ×1 IMPLANT
GLOVE ECLIPSE 7.0 STRL STRAW (GLOVE) ×5 IMPLANT
GOWN STRL REUS W/ TWL LRG LVL3 (GOWN DISPOSABLE) ×2 IMPLANT
GOWN STRL REUS W/ TWL XL LVL3 (GOWN DISPOSABLE) IMPLANT
GOWN STRL REUS W/TWL 2XL LVL3 (GOWN DISPOSABLE) IMPLANT
GOWN STRL REUS W/TWL LRG LVL3 (GOWN DISPOSABLE) ×4
GOWN STRL REUS W/TWL XL LVL3 (GOWN DISPOSABLE) ×4
HEMOSTAT POWDER KIT SURGIFOAM (HEMOSTASIS) ×2 IMPLANT
HEMOSTAT SURGICEL 2X14 (HEMOSTASIS) ×1 IMPLANT
KIT BASIN OR (CUSTOM PROCEDURE TRAY) ×2 IMPLANT
KIT ROOM TURNOVER OR (KITS) ×2 IMPLANT
NDL HYPO 25X1 1.5 SAFETY (NEEDLE) ×1 IMPLANT
NEEDLE HYPO 25X1 1.5 SAFETY (NEEDLE) ×2 IMPLANT
NS IRRIG 1000ML POUR BTL (IV SOLUTION) ×2 IMPLANT
PACK CRANIOTOMY (CUSTOM PROCEDURE TRAY) ×2 IMPLANT
PATTIES SURGICAL .5 X.5 (GAUZE/BANDAGES/DRESSINGS) IMPLANT
PATTIES SURGICAL .5 X3 (DISPOSABLE) IMPLANT
PATTIES SURGICAL 1X1 (DISPOSABLE) IMPLANT
SCREW SELF DRILL HT 1.5/4MM (Screw) ×5 IMPLANT
SPONGE NEURO XRAY DETECT 1X3 (DISPOSABLE) IMPLANT
SPONGE SURGIFOAM ABS GEL 100 (HEMOSTASIS) ×2 IMPLANT
STAPLER VISISTAT 35W (STAPLE) ×2 IMPLANT
SUT NURALON 4 0 TR CR/8 (SUTURE) ×6 IMPLANT
SUT VIC AB 0 CT1 18XCR BRD8 (SUTURE) ×2 IMPLANT
SUT VIC AB 0 CT1 8-18 (SUTURE) ×4
SUT VIC AB 3-0 SH 8-18 (SUTURE) ×3 IMPLANT
SUT VICRYL 3-0 RB1 18 ABS (SUTURE) ×3 IMPLANT
SYR CONTROL 10ML LL (SYRINGE) ×4 IMPLANT
TIP RIGID 35CM EVICEL (HEMOSTASIS) ×1 IMPLANT
TOWEL OR 17X24 6PK STRL BLUE (TOWEL DISPOSABLE) ×2 IMPLANT
TOWEL OR 17X26 10 PK STRL BLUE (TOWEL DISPOSABLE) ×2 IMPLANT
TUBE CONNECTING 12X1/4 (SUCTIONS) ×1 IMPLANT
UNDERPAD 30X30 INCONTINENT (UNDERPADS AND DIAPERS) ×2 IMPLANT
WATER STERILE IRR 1000ML POUR (IV SOLUTION) ×2 IMPLANT

## 2016-02-03 NOTE — Anesthesia Procedure Notes (Signed)
Procedure Name: Intubation Date/Time: 02/03/2016 5:59 PM Performed by: Trixie Deis A Pre-anesthesia Checklist: Patient identified, Emergency Drugs available, Suction available, Patient being monitored and Timeout performed Patient Re-evaluated:Patient Re-evaluated prior to inductionOxygen Delivery Method: Circle system utilized Preoxygenation: Pre-oxygenation with 100% oxygen Intubation Type: IV induction Ventilation: Mask ventilation without difficulty Laryngoscope Size: Glidescope and 3 Grade View: Grade I Tube type: Oral Tube size: 7.0 mm Number of attempts: 1 Airway Equipment and Method: Stylet and Rigid stylet Placement Confirmation: ETT inserted through vocal cords under direct vision,  breath sounds checked- equal and bilateral and CO2 detector Secured at: 21 cm Tube secured with: Tape Dental Injury: Teeth and Oropharynx as per pre-operative assessment

## 2016-02-03 NOTE — Transfer of Care (Signed)
Immediate Anesthesia Transfer of Care Note  Patient: Crystal Meyers  Procedure(s) Performed: Procedure(s): REPAIR OF CEREBROSPINAL FLUID LEAK AND HARVEST ABDOMINAL FAT GRAFT (Right)  Patient Location: PACU  Anesthesia Type:General  Level of Consciousness: awake, alert  and oriented  Airway & Oxygen Therapy: Patient Spontanous Breathing and Patient connected to nasal cannula oxygen  Post-op Assessment: Report given to RN, Post -op Vital signs reviewed and stable and Patient moving all extremities  Post vital signs: Reviewed and stable  Last Vitals:  Vitals:   02/03/16 1600 02/03/16 1954  BP:  (!) 149/85  Pulse: 74 73  Resp: 17 (!) 25  Temp:  36.3 C    Last Pain:  Vitals:   02/03/16 1954  TempSrc:   PainSc: Asleep      Patients Stated Pain Goal: 0 ([id number])  Complications: No apparent anesthesia complications

## 2016-02-03 NOTE — Progress Notes (Signed)
Peripherally Inserted Central Catheter/Midline Placement  The IV Nurse has discussed with the patient and/or persons authorized to consent for the patient, the purpose of this procedure and the potential benefits and risks involved with this procedure.  The benefits include less needle sticks, lab draws from the catheter and patient may be discharged home with the catheter.  Risks include, but not limited to, infection, bleeding, blood clot (thrombus formation), and puncture of an artery; nerve damage and irregular heat beat.  Alternatives to this procedure were also discussed.  PICC/Midline Placement Documentation        Crystal Meyers 02/03/2016, 10:19 AM

## 2016-02-03 NOTE — Progress Notes (Signed)
No issues overnight. Pt cont to have post-nasal drip. HA unchanged.  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 All questions were answered and consent was obtained.

## 2016-02-03 NOTE — Op Note (Signed)
PREOP DIAGNOSIS:  1. CSF rhinorrhea   POSTOP DIAGNOSIS: Same  PROCEDURE: 1. Exploration of cranial wound, repair of CSF leak with harvest of abdominal fat graft  SURGEON: Dr. [name], MD  ASSISTANT: Dr. Ashok Pall, MD  ANESTHESIA: General Endotracheal  EBL: 50cc  SPECIMENS: None  DRAINS: None  COMPLICATIONS: None immediate  CONDITION: Stable to PACU  HISTORY: Crystal Meyers is a 70 y.o. 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 Postoperatively, she did have improvement in her right facial pain, however was noted to have CSF rhinorrhea. Lumbar drain was placed for CSF diversion, and after clamping trial she continued to have rhinorrhea. Second clamping trial was attempted with same result. She therefore presents for surgical exploration of her wound and repair of CSF leak with abdominal fat harvest. Risks and benefits of the surgery were explained in detail to the patient and her daughter. After all questions were answered, informed consent was obtained.  PROCEDURE IN DETAIL: After informed consent was obtained and witnessed, the patient was brought to the operating room. After induction of general anesthesia, the patient was positioned on the operative table in the supine position with a right-sided shoulder roll and the head turned to nearly lateral position. All pressure points were meticulously padded. Previous skin incision was then marked out and prepped and draped in the usual sterile fashion as was the right side of the abdomen.  After timeout was conducted, the retroauricular skin incision was opened with scissors. Subcutaneous and fascial stitches were then cut. The previously placed mesh was identified and the screws removed and the mesh explanted. The piece of dura matrix onlay was then removed. The bone edges were inspected, and 2 small mastoid air cells were identified.  The bone edges were then waxed to cover the exposed air cells. The abdominal incision was then opened sharply, and subcutaneous fat was harvested. The fat graft was then placed into the retro-sigmoid craniectomy defect. The area was then covered with fibrin sealant. A piece of dura matrix was then placed over the fat graft. The cranioplasty plate was then replaced with standard titanium screws. The wound was then irrigated with copious amounts of normal saline irrigation. Wound was then closed in multiple layers with a combination of 0 and 3-0 Vicryl stitches. The skin was closed Dermabond. The abdominal incision was also closed with interrupted 3-0 Vicryl stitches, and a layer of Dermabond was applied.  At the end of the case all sponge, needle, instrument, and cottonoid counts were correct. The patient was then transferred to the stretcher, extubated, and taken to the postanesthesia care unit in stable hemodynamic condition.

## 2016-02-03 NOTE — Anesthesia Preprocedure Evaluation (Signed)
Anesthesia Evaluation  Patient identified by MRN, date of birth, ID band Patient awake    Reviewed: Allergy & Precautions, NPO status , Patient's Chart, lab work & pertinent test results  History of Anesthesia Complications (+) DIFFICULT AIRWAY  Airway Mallampati: II  TM Distance: >3 FB Neck ROM: Full    Dental no notable dental hx. (+) Teeth Intact, Dental Advisory Given   Pulmonary sleep apnea and Continuous Positive Airway Pressure Ventilation , COPD, Current Smoker,    Pulmonary exam normal        Cardiovascular hypertension, Pt. on medications (-) angina(-) Past MI and (-) CHF  Rhythm:Regular     Neuro/Psych Anxiety Depression  Neuromuscular disease    GI/Hepatic GERD  Medicated and Controlled,  Endo/Other  Hypothyroidism   Renal/GU      Musculoskeletal  (+) Arthritis , Osteoarthritis,    Abdominal Normal abdominal exam  (+)   Peds  Hematology   Anesthesia Other Findings Trigeminal Neuroalgia  Reproductive/Obstetrics                             Anesthesia Physical  Anesthesia Plan  ASA: III  Anesthesia Plan: General   Post-op Pain Management:    Induction: Intravenous  Airway Management Planned: Oral ETT and Video Laryngoscope Planned  Additional Equipment: None  Intra-op Plan:   Post-operative Plan: Extubation in OR  Informed Consent:   Dental advisory given  Plan Discussed with: CRNA, Anesthesiologist and Surgeon  Anesthesia Plan Comments:         Anesthesia Quick Evaluation

## 2016-02-03 NOTE — Progress Notes (Addendum)
Dr. Kathyrn Sheriff at bedside and verbally asks for 10 to be drained from Lumbar Drain and clamped.

## 2016-02-04 ENCOUNTER — Encounter (HOSPITAL_COMMUNITY): Payer: Self-pay | Admitting: Neurosurgery

## 2016-02-04 MED ORDER — OXYCODONE-ACETAMINOPHEN 5-325 MG PO TABS
1.0000 | ORAL_TABLET | ORAL | Status: DC | PRN
  Administered 2016-02-04: 1 via ORAL
  Filled 2016-02-04 (×2): qty 1

## 2016-02-04 NOTE — Progress Notes (Signed)
Pt seen and examined. No issues overnight. Reports no post-nasal drip or rhinorrhea.  EXAM: Temp:  [97.3 F (36.3 C)-98.6 F (37 C)] 98.6 F (37 C) (07/27 0800) Pulse Rate:  [58-78] 67 (07/27 0900) Resp:  [0-26] 0 (07/27 0900) BP: (104-184)/(63-129) 148/84 (07/27 0900) SpO2:  [96 %-100 %] 99 % (07/27 0900) Intake/Output      07/26 0701 - 07/27 0700 07/27 0701 - 07/28 0700   I.V. (mL/kg) 500 (6.2)    IV Piggyback 500    Total Intake(mL/kg) 1000 (12.5)    Urine (mL/kg/hr) 250 (0.1)    Drains 148 (0.1) 24 (0.1)   Other 15 (0)    Blood 20 (0)    Total Output 433 24   Net +567 -24         Awake, alert, oriented CN intact Good strenght Wounds c/d/i, no leakage  LABS: Lab Results  Component Value Date   CREATININE 1.01 (H) 02/03/2016   BUN 15 02/03/2016   NA 137 02/03/2016   K 3.6 02/03/2016   CL 113 (H) 02/03/2016   CO2 19 (L) 02/03/2016   Lab Results  Component Value Date   WBC 6.8 01/30/2016   HGB 12.5 01/30/2016   HCT 37.2 01/30/2016   MCV 89.4 01/30/2016   PLT 208 01/30/2016    IMPRESSION: - 70 y.o. female s/p CSF leak repair  PLAN: - Will cont LD @ 10cc q hour for today - Can sit up

## 2016-02-04 NOTE — Progress Notes (Signed)
Pharmacy Antibiotic Note  Crystal Meyers is a 70 y.o. female admitted on 01/21/2016 with CSF leak. Pharmacy has been consulted for vancomycin dosing. Today is day #13 of therapy. Pt is afebrile, no recent CBC to evaluate WBC. Renal function has been stable and last vancomycin trough was at goal. S/p surgical repair of CSF leak 7/26.   Plan: - Continue vanc at 1500mg  IV Q24H  - F/u renal fxn, C&S, clinical status and trough as needed - MD - please address planned LOT  Height: 5' 6.5" (168.9 cm) Weight: 177 lb (80.3 kg) IBW/kg (Calculated) : 60.45  Temp (24hrs), Avg:97.8 F (36.6 C), Min:97.3 F (36.3 C), Max:98.4 F (36.9 C)   Recent Labs Lab 01/28/16 1037 01/30/16 0227 02/01/16 2217 02/02/16 0329 02/03/16 0248  WBC  --  6.8  --   --   --   CREATININE  --  1.11*  --  1.02* 1.01*  VANCOTROUGH 26*  --  15  --   --     Estimated Creatinine Clearance: 58.4 mL/min (by C-G formula based on SCr of 1.01 mg/dL).    Allergies  Allergen Reactions  . No Known Allergies     Antimicrobials this admission: Vanc 7/15>> CTX 7/15>>  Dose adjustments this admission: 7/20 VT = 26 on 1gm IV Q12H, changed to 1500mg  IV Q24H 7/24 VT = 15, no change  Microbiology results: 7/13 MRSA PCR: negative   Salome Arnt, PharmD, BCPS Pager # [phone] 02/04/2016 7:41 AM

## 2016-02-04 NOTE — Care Management Important Message (Signed)
Important Message  Patient Details  Name: Crystal Meyers MRN: [id number] Date of Birth: Dec 03, 1945   Medicare Important Message Given:  Yes    Nathen May 02/04/2016, 12:09 PM

## 2016-02-05 NOTE — Progress Notes (Signed)
Pt with continued lumbar drain; plan clamping of drain tomorrow, then DC drain on Sunday, per report.  IV Rocephin and Vancomycin continuing as ordered.  Pt to dc home with family when CSF leak resolved.   Reinaldo Raddle, RN, BSN  Trauma/Neuro ICU Case Manager [phone]

## 2016-02-06 LAB — BASIC METABOLIC PANEL
ANION GAP: 5 (ref 5–15)
BUN: 15 mg/dL (ref 6–20)
CHLORIDE: 114 mmol/L — AB (ref 101–111)
CO2: 19 mmol/L — ABNORMAL LOW (ref 22–32)
Calcium: 8.7 mg/dL — ABNORMAL LOW (ref 8.9–10.3)
Creatinine, Ser: 0.99 mg/dL (ref 0.44–1.00)
GFR, EST NON AFRICAN AMERICAN: 58 mL/min — AB (ref >60)
Glucose, Bld: 98 mg/dL (ref 65–99)
POTASSIUM: 3 mmol/L — AB (ref 3.5–5.1)
SODIUM: 138 mmol/L (ref 135–145)

## 2016-02-06 LAB — CBC
HCT: 31.1 % — ABNORMAL LOW (ref 36.0–46.0)
HEMOGLOBIN: 10.6 g/dL — AB (ref 12.0–15.0)
MCH: 29.6 pg (ref 26.0–34.0)
MCHC: 34.1 g/dL (ref 30.0–36.0)
MCV: 86.9 fL (ref 78.0–100.0)
PLATELETS: 193 10*3/uL (ref 150–400)
RBC: 3.58 MIL/uL — AB (ref 3.87–5.11)
RDW: 12.2 % (ref 11.5–15.5)
WBC: 5.2 10*3/uL (ref 4.0–10.5)

## 2016-02-06 NOTE — Progress Notes (Signed)
Dr Kathyrn Sheriff notified of pt with small amount of clear nasal drainage while sitting in the chair. The pt stated she felt fluid dripping in back of throat. The pt was returned to bed and became tearful and stated more fluid was draining. Dr Kathyrn Sheriff gave orders to return pt on bedrest with HOB 30-45 degrees and open drain to collect a sample and send for a beta 2 transferrin test at 13:45. Spoke with Rhea from lab at 15:15 who was able to help with instruction on how to collect the sample for send out. Will continue to monitor.

## 2016-02-06 NOTE — Progress Notes (Signed)
No issues overnight. Does not report any nasal or post-nasal discharge.  EXAM:  BP (!) 164/83 (BP Location: Left Leg)   Pulse (!) 51   Temp 97.7 F (36.5 C) (Oral)   Resp 16   Ht 5' 6.5" (1.689 m)   Wt 80.3 kg (177 lb)   SpO2 96%   BMI 28.14 kg/m   Awake, alert, oriented  Speech fluent, appropriate  CN grossly intact  5/5 BUE/BLE   IMPRESSION:  70 y.o. female s/p CSF leak repair, no further drainage  PLAN: - Will clamp LD today - OOB - Plan on d/c lumbar drain tomorrow.

## 2016-02-07 ENCOUNTER — Inpatient Hospital Stay (HOSPITAL_COMMUNITY): Payer: Medicare Other

## 2016-02-07 LAB — VANCOMYCIN, TROUGH: VANCOMYCIN TR: 17 ug/mL (ref 15–20)

## 2016-02-07 MED ORDER — CHLORHEXIDINE GLUCONATE 0.12 % MT SOLN
15.0000 mL | Freq: Two times a day (BID) | OROMUCOSAL | Status: DC
  Administered 2016-02-07 – 2016-02-08 (×2): 15 mL via OROMUCOSAL
  Filled 2016-02-07: qty 15

## 2016-02-07 MED ORDER — CETYLPYRIDINIUM CHLORIDE 0.05 % MT LIQD
7.0000 mL | Freq: Two times a day (BID) | OROMUCOSAL | Status: DC
  Administered 2016-02-07: 7 mL via OROMUCOSAL

## 2016-02-07 NOTE — Progress Notes (Signed)
Pharmacy Antibiotic Note  Crystal Meyers is a 70 y.o. female admitted on 01/21/2016 with CSF leak. Pharmacy consulted for vancomycin dosing. Today is day #16 of antibiotics. Renal function has been stable. S/p surgical repair of CSF leak 7/26.   Vancomycin trough 17 mcg/ml (THERAPEUTIC) on 1500mg  IV q24h  Plan: - Continue Vancomycin 1500mg  IV Q24H  - Ceftriaxone 2g/24h - F/u renal fxn, C&S, clinical status  - MD - please address planned LOT  Height: 5' 6.5" (168.9 cm) Weight: 177 lb (80.3 kg) IBW/kg (Calculated) : 60.45  Temp (24hrs), Avg:98 F (36.7 C), Min:97.5 F (36.4 C), Max:98.4 F (36.9 C)   Recent Labs Lab 02/01/16 2217 02/02/16 0329 02/03/16 0248 02/06/16 0610 02/07/16 2130  WBC  --   --   --  5.2  --   CREATININE  --  1.02* 1.01* 0.99  --   VANCOTROUGH 15  --   --   --  17    Estimated Creatinine Clearance: 59.5 mL/min (by C-G formula based on SCr of 0.99 mg/dL).    Allergies  Allergen Reactions  . No Known Allergies     Antimicrobials this admission: Vanc 7/15>> CTX 7/15>>  Dose adjustments this admission: 7/19 VT = 28 drawn after vanc dose started 7/20 VT = 26 on 1g Q12H  7/24 VT= 15 no change 7/30 VT = 17 no change  Microbiology results: 7/13 MRSA PCR: negative   Sherlon Handing, PharmD, BCPS Clinical pharmacist, pager [phone] 02/07/2016 10:32 PM

## 2016-02-07 NOTE — Progress Notes (Signed)
Pt seen and examined. No issues overnight. Pt states that the drainage yesterday was when she was sitting in chair, however she says it is different from the previous drainage. This time it did not have the metallic taste, and was thicker, almost "stringy." Also, it was not associated with HA.  EXAM: Temp:  [97.5 F (36.4 C)-98.4 F (36.9 C)] 97.5 F (36.4 C) (07/30 0800) Pulse Rate:  [57-81] 60 (07/30 0700) Resp:  [13-23] 22 (07/30 0600) BP: (92-177)/(50-89) 156/73 (07/30 0948) SpO2:  [96 %-100 %] 99 % (07/30 0700) Intake/Output      07/29 0701 - 07/30 0700 07/30 0701 - 07/31 0700   P.O. 660    I.V. (mL/kg) 40 (0.5)    IV Piggyback 550    Total Intake(mL/kg) 1250 (15.6)    Urine (mL/kg/hr) 0 (0)    Drains 80 (0) 4 (0)   Stool 0 (0)    Total Output 80 4   Net +1170 -4        Urine Occurrence 3 x    Stool Occurrence 1 x     Awake, alert, oriented Speech fluent CN intact Wound c/d/i LD in place  LABS: Lab Results  Component Value Date   CREATININE 0.99 02/06/2016   BUN 15 02/06/2016   NA 138 02/06/2016   K 3.0 (L) 02/06/2016   CL 114 (H) 02/06/2016   CO2 19 (L) 02/06/2016   Lab Results  Component Value Date   WBC 5.2 02/06/2016   HGB 10.6 (L) 02/06/2016   HCT 31.1 (L) 02/06/2016   MCV 86.9 02/06/2016   PLT 193 02/06/2016    IMPRESSION: - 70 y.o. female s/p CSF leak repair, episode yesterday does not sound like CSF.  PLAN: - Will await beta-2 transferrin results - CT head w/o today - Cont HOB @ 45deg, LD 5cc/hour

## 2016-02-07 NOTE — Progress Notes (Signed)
Pharmacy Antibiotic Note  Crystal Meyers is a 70 y.o. female admitted on 01/21/2016 with CSF leak. Pharmacy has been consulted for vancomycin dosing. Today is day #16 of antibiotics. Pt is afebrile, cbc wnl, afebrile. Renal function has been stable and last vancomycin trough was at goal. S/p surgical repair of CSF leak 7/26.   Plan: - Vancomycin 1500mg  IV Q24H  - Ceftriaxone 2g/24h - Vancomycin trough tonight - F/u renal fxn, C&S, clinical status  - MD - please address planned LOT  Height: 5' 6.5" (168.9 cm) Weight: 177 lb (80.3 kg) IBW/kg (Calculated) : 60.45  Temp (24hrs), Avg:97.9 F (36.6 C), Min:97.5 F (36.4 C), Max:98.4 F (36.9 C)   Recent Labs Lab 02/01/16 2217 02/02/16 0329 02/03/16 0248 02/06/16 0610  WBC  --   --   --  5.2  CREATININE  --  1.02* 1.01* 0.99  VANCOTROUGH 15  --   --   --     Estimated Creatinine Clearance: 59.5 mL/min (by C-G formula based on SCr of 0.99 mg/dL).    Allergies  Allergen Reactions  . No Known Allergies     Antimicrobials this admission: Vanc 7/15>> CTX 7/15>>  Dose adjustments this admission: 7/19 VT = 28 drawn after vanc dose started 7/20 VT = 26 on 1g Q12H  7/24 VT= 15 no change  Microbiology results: 7/13 MRSA PCR: negative      Hughes Better, PharmD, BCPS Clinical Pharmacist 02/07/2016 11:29 AM

## 2016-02-08 MED ORDER — OXYMETAZOLINE HCL 0.05 % NA SOLN
1.0000 | Freq: Two times a day (BID) | NASAL | Status: DC | PRN
  Administered 2016-02-08: 1 via NASAL
  Filled 2016-02-08 (×2): qty 15

## 2016-02-08 NOTE — Progress Notes (Signed)
Pt has home cpap and places self on/off.

## 2016-02-08 NOTE — Progress Notes (Signed)
No issues overnight. Had a few drops of fluid from both nostrils early this am, unclear if this was more mucus or thinner fluid.  EXAM:  BP (!) 162/76   Pulse 65   Temp 97.6 F (36.4 C) (Axillary)   Resp 13   Ht 5' 6.5" (1.689 m)   Wt 80.3 kg (177 lb)   SpO2 99%   BMI 28.14 kg/m   Awake, alert, oriented  Speech fluent, appropriate  CN grossly intact  5/5 BUE/BLE   IMAGING: CTH reviewed, some fluid in posterior mastoid, no fluid in the middle ear.  IMPRESSION:  70 y.o. female s/p CSF leak repair  PLAN: - Cont LD - Awaiting results of Beta-transferrin

## 2016-02-08 NOTE — Care Management Important Message (Signed)
Important Message  Patient Details  Name: Crystal Meyers MRN: [id number] Date of Birth: Feb 04, 1946   Medicare Important Message Given:  Yes    Loann Quill 02/08/2016, 3:25 PM

## 2016-02-08 NOTE — Progress Notes (Signed)
Pt has home cpap and places self on/off.  Rt will monitor.

## 2016-02-09 LAB — BASIC METABOLIC PANEL
ANION GAP: 7 (ref 5–15)
BUN: 12 mg/dL (ref 6–20)
CO2: 18 mmol/L — AB (ref 22–32)
Calcium: 8.7 mg/dL — ABNORMAL LOW (ref 8.9–10.3)
Chloride: 115 mmol/L — ABNORMAL HIGH (ref 101–111)
Creatinine, Ser: 0.98 mg/dL (ref 0.44–1.00)
GFR calc Af Amer: >60 mL/min (ref >60)
GFR calc non Af Amer: 58 mL/min — ABNORMAL LOW (ref >60)
GLUCOSE: 118 mg/dL — AB (ref 65–99)
POTASSIUM: 2.9 mmol/L — AB (ref 3.5–5.1)
Sodium: 140 mmol/L (ref 135–145)

## 2016-02-09 MED ORDER — OXYMETAZOLINE HCL 0.05 % NA SOLN: 1.0000 | Freq: Two times a day (BID) | NASAL | 0 refills | Status: DC | PRN

## 2016-02-09 MED ORDER — OXYCODONE-ACETAMINOPHEN 5-325 MG PO TABS: 1.0000 | ORAL_TABLET | ORAL | 0 refills | Status: DC | PRN

## 2016-02-09 NOTE — Progress Notes (Signed)
No issues overnight. Pt had another episode of some discharge in the back of her throat. It was self-limited, tasteless.   EXAM:  BP 130/85   Pulse 66   Temp 97.9 F (36.6 C) (Oral)   Resp 14   Ht 5' 6.5" (1.689 m)   Wt 80.3 kg (177 lb)   SpO2 96%   BMI 28.14 kg/m   Awake, alert, oriented  Speech fluent, appropriate  CN grossly intact  5/5 BUE/BLE  I am unable to elicit drainage with challenge by leaning patient forward with head flexed.  IMPRESSION:  70 y.o. female s/p CSF leak repair. Remains unclear whether these episodes are CSF or not. They are intermittent, not illicitable, and CT did not show fluid in middle ear. I am very skeptical that this is truly CSF. The lab has called to say the specimen is insufficient for beta-transferrin test.  PLAN: - I have removed the lumbar drain and will d/c home.  - I have instructed her to follow up with me if there is sustained thin clear drainage from her nose, or any fever, chills, lethargy, or altered mental status.

## 2016-02-09 NOTE — Discharge Summary (Signed)
  Physician Discharge Summary  Patient ID: Crystal Meyers MRN: [id number] DOB/AGE: Jan 04, 1946 70 y.o.  Admit date: 01/21/2016 Discharge date: 02/09/2016  Admission Diagnoses:  Trigeminal neuralgia  Discharge Diagnoses: Same Active Problems:   Trigeminal neuralgia of right side of face   Discharged Condition: Stable  Hospital Course:  [name] is a 70 y.o. female electively admitted after right MVD for trigeminal neuralgia. She reported some nasal and post-nasal drainage and lumbar drain was placed. After clamping, she reported more drainage. She was therefore taken for CSF leak repair with abd fat graft. She was observed and did have some intermittent episodes of discharge although it remained unclear whether this was truly CSF. CT did not demonstrate any fluid in the middle ear. The lumbar drain was therefore removed and she was discharged home with instructions to return if there was continuous drainage of any signs of infection.  Treatments: Surgery - right MVD,  Wound eploration, repair of CSF leak  Discharge Exam: Blood pressure 130/85, pulse 66, temperature 97.9 F (36.6 C), temperature source Oral, resp. rate 14, height 5' 6.5" (1.689 m), weight 80.3 kg (177 lb), SpO2 96 %. Awake, alert, oriented Speech fluent, appropriate CN grossly intact 5/5 BUE/BLE Wound c/d/i  Disposition: 01-Home or Self Care     Medication List    TAKE these medications   aspirin EC 81 MG tablet Take 81 mg by mouth daily.   levothyroxine 75 MCG tablet Commonly known as:  SYNTHROID, LEVOTHROID TAKE 1 TABLET BY MOUTH EVERY DAY   lisinopril 40 MG tablet Commonly known as:  PRINIVIL,ZESTRIL TAKE 0.5 TABLET BY MOUTH DAILY   omeprazole 40 MG capsule Commonly known as:  PRILOSEC TAKE 1 CAPSULE BY MOUTH EVERY DAY What changed:  Another medication with the same name was added. Make sure you understand how and when to take each.   omeprazole 40 MG capsule Commonly known as:   PRILOSEC TAKE 1 CAPSULE BY MOUTH EVERY DAY What changed:  You were already taking a medication with the same name, and this prescription was added. Make sure you understand how and when to take each.   oxyCODONE-acetaminophen 5-325 MG tablet Commonly known as:  PERCOCET/ROXICET Take 1 tablet by mouth every 4 (four) hours as needed for moderate pain.   oxymetazoline 0.05 % nasal spray Commonly known as:  AFRIN Place 1 spray into both nostrils 2 (two) times daily as needed for congestion.   simvastatin 20 MG tablet Commonly known as:  ZOCOR TAKE 1 TABLET(20 MG) BY MOUTH AT BEDTIME   venlafaxine XR 150 MG 24 hr capsule Commonly known as:  EFFEXOR-XR TAKE 1 CAPSULE BY MOUTH EVERY DAY   venlafaxine XR 150 MG 24 hr capsule Commonly known as:  EFFEXOR-XR TAKE 1 CAPSULE BY MOUTH EVERY DAY      Follow-up Information    Karlton Maya, C, MD Follow up in 3 week(s).   Specialty:  Neurosurgery Contact information: [address] [place] [address] [phone]           Signed: [name], Loletha Grayer 02/09/2016, 4:18 PM

## 2016-02-09 NOTE — Care Management Note (Signed)
Case Management Note  Patient Details  Name: Crystal Meyers MRN: [id number] Date of Birth: 1945/12/07  Subjective/Objective:   Pt discharging home today with family to assist.                  Action/Plan: No discharge needs identified.    Expected Discharge Date:   02/09/2016               Expected Discharge Plan:  Home/Self Care  In-House Referral:     Discharge planning Services  CM Consult  Post Acute Care Choice:    Choice offered to:     DME Arranged:    DME Agency:     HH Arranged:    HH Agency:     Status of Service:  Completed, signed off  If discussed at [employer] of Stay Meetings, dates discussed:    Additional Comments:  Ella Bodo, RN 02/09/2016, 4:46 PM

## 2016-02-10 NOTE — Anesthesia Postprocedure Evaluation (Signed)
Anesthesia Post Note  Patient: ANORAH CROSSWHITE  Procedure(s) Performed: Procedure(s) (LRB): REPAIR OF CEREBROSPINAL FLUID LEAK AND HARVEST ABDOMINAL FAT GRAFT (Right)  Patient location during evaluation: PACU Anesthesia Type: General Level of consciousness: awake and alert Pain management: pain level controlled Vital Signs Assessment: post-procedure vital signs reviewed and stable Respiratory status: spontaneous breathing, nonlabored ventilation, respiratory function stable and patient connected to nasal cannula oxygen Cardiovascular status: blood pressure returned to baseline and stable Postop Assessment: no signs of nausea or vomiting Anesthetic complications: no    Last Vitals:  Vitals:   02/09/16 1400 02/09/16 1500  BP: 120/80 130/85  Pulse: 72 66  Resp: (!) 22 14  Temp:      Last Pain:  Vitals:   02/09/16 1501  TempSrc:   PainSc: 2                  Shabre Kreher,JAMES TERRILL

## 2016-03-06 ENCOUNTER — Other Ambulatory Visit: Payer: Self-pay | Admitting: Internal Medicine

## 2016-03-29 DIAGNOSIS — Z85828 Personal history of other malignant neoplasm of skin: Secondary | ICD-10-CM | POA: Diagnosis not present

## 2016-03-29 DIAGNOSIS — D229 Melanocytic nevi, unspecified: Secondary | ICD-10-CM | POA: Diagnosis not present

## 2016-03-29 DIAGNOSIS — L820 Inflamed seborrheic keratosis: Secondary | ICD-10-CM | POA: Diagnosis not present

## 2016-03-29 DIAGNOSIS — D1800 Hemangioma unspecified site: Secondary | ICD-10-CM | POA: Diagnosis not present

## 2016-03-29 DIAGNOSIS — L578 Other skin changes due to chronic exposure to nonionizing radiation: Secondary | ICD-10-CM | POA: Diagnosis not present

## 2016-03-29 DIAGNOSIS — I788 Other diseases of capillaries: Secondary | ICD-10-CM | POA: Diagnosis not present

## 2016-03-29 DIAGNOSIS — Z1283 Encounter for screening for malignant neoplasm of skin: Secondary | ICD-10-CM | POA: Diagnosis not present

## 2016-03-29 DIAGNOSIS — D692 Other nonthrombocytopenic purpura: Secondary | ICD-10-CM | POA: Diagnosis not present

## 2016-03-29 DIAGNOSIS — B353 Tinea pedis: Secondary | ICD-10-CM | POA: Diagnosis not present

## 2016-03-29 DIAGNOSIS — L821 Other seborrheic keratosis: Secondary | ICD-10-CM | POA: Diagnosis not present

## 2016-03-29 DIAGNOSIS — L720 Epidermal cyst: Secondary | ICD-10-CM | POA: Diagnosis not present

## 2016-03-31 ENCOUNTER — Inpatient Hospital Stay: Payer: Medicare Other | Admitting: Internal Medicine

## 2016-03-31 ENCOUNTER — Inpatient Hospital Stay: Payer: Medicare Other

## 2016-04-11 ENCOUNTER — Other Ambulatory Visit: Payer: Self-pay | Admitting: Internal Medicine

## 2016-04-11 ENCOUNTER — Other Ambulatory Visit: Payer: Self-pay

## 2016-04-11 DIAGNOSIS — C50919 Malignant neoplasm of unspecified site of unspecified female breast: Secondary | ICD-10-CM

## 2016-04-14 ENCOUNTER — Inpatient Hospital Stay: Payer: Medicare Other

## 2016-04-14 ENCOUNTER — Inpatient Hospital Stay: Payer: Medicare Other | Attending: Internal Medicine | Admitting: Internal Medicine

## 2016-04-14 DIAGNOSIS — E039 Hypothyroidism, unspecified: Secondary | ICD-10-CM | POA: Insufficient documentation

## 2016-04-14 DIAGNOSIS — E7800 Pure hypercholesterolemia, unspecified: Secondary | ICD-10-CM | POA: Diagnosis not present

## 2016-04-14 DIAGNOSIS — F329 Major depressive disorder, single episode, unspecified: Secondary | ICD-10-CM | POA: Insufficient documentation

## 2016-04-14 DIAGNOSIS — Z79899 Other long term (current) drug therapy: Secondary | ICD-10-CM | POA: Diagnosis not present

## 2016-04-14 DIAGNOSIS — G4730 Sleep apnea, unspecified: Secondary | ICD-10-CM | POA: Diagnosis not present

## 2016-04-14 DIAGNOSIS — C50412 Malignant neoplasm of upper-outer quadrant of left female breast: Secondary | ICD-10-CM | POA: Diagnosis not present

## 2016-04-14 DIAGNOSIS — Z9013 Acquired absence of bilateral breasts and nipples: Secondary | ICD-10-CM | POA: Diagnosis not present

## 2016-04-14 DIAGNOSIS — K2270 Barrett's esophagus without dysplasia: Secondary | ICD-10-CM | POA: Diagnosis not present

## 2016-04-14 DIAGNOSIS — Z170 Estrogen receptor positive status [ER+]: Secondary | ICD-10-CM | POA: Diagnosis not present

## 2016-04-14 DIAGNOSIS — E876 Hypokalemia: Secondary | ICD-10-CM

## 2016-04-14 DIAGNOSIS — Z853 Personal history of malignant neoplasm of breast: Secondary | ICD-10-CM | POA: Insufficient documentation

## 2016-04-14 DIAGNOSIS — Z7982 Long term (current) use of aspirin: Secondary | ICD-10-CM | POA: Insufficient documentation

## 2016-04-14 DIAGNOSIS — J449 Chronic obstructive pulmonary disease, unspecified: Secondary | ICD-10-CM | POA: Diagnosis not present

## 2016-04-14 DIAGNOSIS — F17210 Nicotine dependence, cigarettes, uncomplicated: Secondary | ICD-10-CM

## 2016-04-14 DIAGNOSIS — K219 Gastro-esophageal reflux disease without esophagitis: Secondary | ICD-10-CM | POA: Insufficient documentation

## 2016-04-14 DIAGNOSIS — C50919 Malignant neoplasm of unspecified site of unspecified female breast: Secondary | ICD-10-CM

## 2016-04-14 DIAGNOSIS — I10 Essential (primary) hypertension: Secondary | ICD-10-CM | POA: Insufficient documentation

## 2016-04-14 LAB — CBC WITH DIFFERENTIAL/PLATELET
BASOS ABS: 0.1 10*3/uL (ref 0–0.1)
BASOS PCT: 2 %
EOS ABS: 0.1 10*3/uL (ref 0–0.7)
Eosinophils Relative: 2 %
HCT: 37.2 % (ref 35.0–47.0)
HEMOGLOBIN: 13.1 g/dL (ref 12.0–16.0)
LYMPHS ABS: 2.6 10*3/uL (ref 1.0–3.6)
Lymphocytes Relative: 40 %
MCH: 31 pg (ref 26.0–34.0)
MCHC: 35.1 g/dL (ref 32.0–36.0)
MCV: 88.1 fL (ref 80.0–100.0)
Monocytes Absolute: 0.4 10*3/uL (ref 0.2–0.9)
Monocytes Relative: 6 %
NEUTROS PCT: 50 %
Neutro Abs: 3.3 10*3/uL (ref 1.4–6.5)
Platelets: 232 10*3/uL (ref 150–440)
RBC: 4.22 MIL/uL (ref 3.80–5.20)
RDW: 13.4 % (ref 11.5–14.5)
WBC: 6.5 10*3/uL (ref 3.6–11.0)

## 2016-04-14 LAB — COMPREHENSIVE METABOLIC PANEL
ALBUMIN: 4.3 g/dL (ref 3.5–5.0)
ALK PHOS: 45 U/L (ref 38–126)
ALT: 26 U/L (ref 14–54)
AST: 28 U/L (ref 15–41)
Anion gap: 10 (ref 5–15)
BUN: 19 mg/dL (ref 6–20)
CALCIUM: 9.5 mg/dL (ref 8.9–10.3)
CO2: 26 mmol/L (ref 22–32)
CREATININE: 1.09 mg/dL — AB (ref 0.44–1.00)
Chloride: 102 mmol/L (ref 101–111)
GFR calc Af Amer: 59 mL/min — ABNORMAL LOW (ref >60)
GFR calc non Af Amer: 51 mL/min — ABNORMAL LOW (ref >60)
GLUCOSE: 86 mg/dL (ref 65–99)
Potassium: 3.8 mmol/L (ref 3.5–5.1)
SODIUM: 138 mmol/L (ref 135–145)
Total Bilirubin: 0.7 mg/dL (ref 0.3–1.2)
Total Protein: 7.4 g/dL (ref 6.5–8.1)

## 2016-04-14 NOTE — Progress Notes (Signed)
Since last office visit- pt had trigeminal nerve block.

## 2016-04-14 NOTE — Assessment & Plan Note (Addendum)
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 Finished letrozole March 2017.  [7 years]Clinically no evidence of recurrence.  # Hypokalemia in hospital-awaiting for potassium from today.  # Follow-up with [place] in one year./Labs.

## 2016-04-14 NOTE — Progress Notes (Signed)
[facility] OFFICE PROGRESS NOTE  Patient Care Team: [name], MD as PCP - General (Internal Medicine) [name], MD (Unknown Physician Specialty)  No matching staging information was found for the patient.   Oncology History   1. 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 Low Oncotype DX score. Diagnosed in June 2010. 2. Status post-bilateral mastectomy and reconstructive surgery. 3. Started Femara approx. 2011.[name] 4.Breast cancer index revealed a 5.2% increase of late recurrence but low likelihood of benefit with extended adjuvant therapy (March of 2017) Letrozole was discontinued from March of 2017     Carcinoma of upper-outer quadrant of left breast in female, estrogen receptor positive ([place])     Breast cancer  This is my first interaction with the patient as patient's primary oncologist has been Dr.Choksi. I reviewed the patient's prior charts/pertinent labs/imaging in detail; findings are summarized above.     INTERVAL HISTORY:  Crystal Meyers 70 y.o.  female pleasant patient above history of stage I breast cancer is here for follow-up.  Denies any lumps or bumps. Appetite is good. Patient was recently in the hospital for trigeminal neuralgia/injection.   Denies any unusual back pain. Denies any chest pain or shortness of the breast.  REVIEW OF SYSTEMS:  A complete 10 point review of system is done which is negative except mentioned above/history of present illness.   PAST MEDICAL HISTORY :  Past Medical History:  Diagnosis Date  . Barrett's esophagus   . Breast cancer ([place]) 7/10   left , invasive lobular carcinoma  . Complication of anesthesia    difficulty waking up and sleep apnea, low sats  . COPD (chronic obstructive pulmonary disease) ([place])   . Depression   . GERD (gastroesophageal reflux disease)   . Hypercholesteremia   .  Hypertension   . Hypothyroidism   . Personal history of tobacco use, presenting hazards to health 05/06/2015  . Personal history of tobacco use, presenting hazards to health 05/06/2015  . Pneumonia    hx of  . PVC (premature ventricular contraction)   . Sleep apnea    wears CPAP nightly  . Trigeminal neuralgia     PAST SURGICAL HISTORY :   Past Surgical History:  Procedure Laterality Date  . BREAST SURGERY Bilateral 2011   dbl mastectomy  . COLONOSCOPY W/ POLYPECTOMY    . CRANIECTOMY Right 01/21/2016   Procedure: Microvascular Decompression - right trigemminal nerve;  Surgeon: [name], MD;  Location: [facility];  Service: Neurosurgery;  Laterality: Right;  . CRANIOTOMY Right 02/03/2016   Procedure: REPAIR OF CEREBROSPINAL FLUID LEAK AND HARVEST ABDOMINAL FAT GRAFT;  Surgeon: [name], MD;  Location: [facility];  Service: Neurosurgery;  Laterality: Right;  . LUMBAR LAMINECTOMY/DECOMPRESSION MICRODISCECTOMY Left 08/11/2015   Procedure: Left Lumbar four-five microdiscectomy;  Surgeon: [name], MD;  Location: [facility];  Service: Neurosurgery;  Laterality: Left;  [name] MASTECTOMY  05/2009   bilateral, reconstructive surgery 09/2009  . MASTECTOMY     Bilateral     FAMILY HISTORY :   Family History  Problem Relation Age of Onset  . Mental illness Mother   . Diabetes Mother   . Hypertension Mother     SOCIAL HISTORY:   Social History  Substance Use Topics  . Smoking status: Current Every Day Smoker    Packs/day: 1.00    Years: 54.00    Types: Cigarettes  .  Smokeless tobacco: Never Used  . Alcohol use No    ALLERGIES:  is allergic to no known allergies.  MEDICATIONS:  Current Outpatient Prescriptions  Medication Sig Dispense Refill  . aspirin EC 81 MG tablet Take 81 mg by mouth daily.    [name] levothyroxine (SYNTHROID, LEVOTHROID) 75 MCG tablet TAKE 1 TABLET BY MOUTH EVERY DAY 90 tablet 0  . lisinopril (PRINIVIL,ZESTRIL) 40 MG tablet TAKE 0.5  TABLET BY MOUTH DAILY 45 tablet 1  . omeprazole (PRILOSEC) 40 MG capsule TAKE 1 CAPSULE BY MOUTH EVERY DAY 90 capsule 0  . simvastatin (ZOCOR) 20 MG tablet TAKE 1 TABLET(20 MG) BY MOUTH AT BEDTIME 90 tablet 2  . venlafaxine XR (EFFEXOR-XR) 150 MG 24 hr capsule TAKE 1 CAPSULE BY MOUTH EVERY DAY 90 capsule 0   No current facility-administered medications for this visit.     PHYSICAL EXAMINATION: ECOG PERFORMANCE STATUS: 0 - Asymptomatic  BP 135/84 (BP Location: Right Arm, Patient Position: Sitting)   Pulse 76   Temp 97.8 F (36.6 C) (Tympanic)   Resp 20   Ht 5' 6.5" (1.689 m)   Wt 180 lb (81.6 kg)   BMI 28.62 kg/m   Filed Weights   04/14/16 1541  Weight: 180 lb (81.6 kg)    GENERAL: Well-nourished well-developed; Alert, no distress and comfortable.   With her daughter EYES: no pallor or icterus OROPHARYNX: no thrush or ulceration; good dentition  NECK: supple, no masses felt LYMPH:  no palpable lymphadenopathy in the cervical, axillary or inguinal regions LUNGS: clear to auscultation and  No wheeze or crackles HEART/CVS: regular rate & rhythm and no murmurs; No lower extremity edema ABDOMEN:abdomen soft, non-tender and normal bowel sounds Musculoskeletal:no cyanosis of digits and no clubbing  PSYCH: alert & oriented x 3 with fluent speech NEURO: no focal motor/sensory deficits SKIN:  no rashes or significant lesions Right and left BREAST exam [in the presence of nurse]- bilateral reconstructed breasts. No lumps or bumps noted Surgical scars noted.    LABORATORY DATA:  I have reviewed the data as listed    Component Value Date/Time   NA 138 04/14/2016 1510   NA 136 (A) 09/23/2014   NA 140 03/26/2014 1448   K 3.8 04/14/2016 1510   K 4.0 03/26/2014 1448   CL 102 04/14/2016 1510   CL 104 03/26/2014 1448   CO2 26 04/14/2016 1510   CO2 30 03/26/2014 1448   GLUCOSE 86 04/14/2016 1510   GLUCOSE 91 03/26/2014 1448   BUN 19 04/14/2016 1510   BUN 14 09/23/2014   BUN 19  (H) 03/26/2014 1448   CREATININE 1.09 (H) 04/14/2016 1510   CREATININE 1.12 03/26/2014 1448   CALCIUM 9.5 04/14/2016 1510   CALCIUM 9.5 03/26/2014 1448   PROT 7.4 04/14/2016 1510   PROT 6.9 03/26/2014 1448   ALBUMIN 4.3 04/14/2016 1510   ALBUMIN 3.7 03/26/2014 1448   AST 28 04/14/2016 1510   AST 23 03/26/2014 1448   ALT 26 04/14/2016 1510   ALT 35 03/26/2014 1448   ALKPHOS 45 04/14/2016 1510   ALKPHOS 45 (L) 03/26/2014 1448   BILITOT 0.7 04/14/2016 1510   BILITOT 0.3 03/26/2014 1448   GFRNONAA 51 (L) 04/14/2016 1510   GFRNONAA 52 (L) 03/26/2014 1448   GFRAA 59 (L) 04/14/2016 1510   GFRAA 60 (L) 03/26/2014 1448    No results found for: SPEP, UPEP  Lab Results  Component Value Date   WBC 6.5 04/14/2016   NEUTROABS 3.3 04/14/2016   HGB  13.1 04/14/2016   HCT 37.2 04/14/2016   MCV 88.1 04/14/2016   PLT 232 04/14/2016      Chemistry      Component Value Date/Time   NA 138 04/14/2016 1510   NA 136 (A) 09/23/2014   NA 140 03/26/2014 1448   K 3.8 04/14/2016 1510   K 4.0 03/26/2014 1448   CL 102 04/14/2016 1510   CL 104 03/26/2014 1448   CO2 26 04/14/2016 1510   CO2 30 03/26/2014 1448   BUN 19 04/14/2016 1510   BUN 14 09/23/2014   BUN 19 (H) 03/26/2014 1448   CREATININE 1.09 (H) 04/14/2016 1510   CREATININE 1.12 03/26/2014 1448   GLU 113 09/23/2014      Component Value Date/Time   CALCIUM 9.5 04/14/2016 1510   CALCIUM 9.5 03/26/2014 1448   ALKPHOS 45 04/14/2016 1510   ALKPHOS 45 (L) 03/26/2014 1448   AST 28 04/14/2016 1510   AST 23 03/26/2014 1448   ALT 26 04/14/2016 1510   ALT 35 03/26/2014 1448   BILITOT 0.7 04/14/2016 1510   BILITOT 0.3 03/26/2014 1448       RADIOGRAPHIC STUDIES: I have personally reviewed the radiological images as listed and agreed with the findings in the report. No results found.   ASSESSMENT & PLAN:  Carcinoma of upper-outer quadrant of left breast in female, estrogen receptor positive ([place]) # Breast cancer stage I ER/PR  positive left side status post mastectomy bilateral. Finished letrozole March 2017.  [7 years]Clinically no evidence of recurrence.  # Hypokalemia in hospital-awaiting for potassium from today.  # Follow-up with [place] in one year./Labs.     Orders Placed This Encounter  Procedures  . CBC with Differential    Standing Status:   Future    Standing Expiration Date:   10/13/2017  . Comprehensive metabolic panel    Standing Status:   Future    Standing Expiration Date:   10/13/2017   All questions were answered. The patient knows to call the clinic with any problems, questions or concerns.      Cammie Sickle, MD 04/14/2016 4:56 PM

## 2016-04-15 ENCOUNTER — Telehealth: Payer: Self-pay | Admitting: *Deleted

## 2016-04-15 NOTE — Telephone Encounter (Signed)
Contacted patient left vm. Explaining labs from yesterday- normal. Pt may call back if further questions or concerns.

## 2016-04-15 NOTE — Telephone Encounter (Signed)
-----   Message from Cammie Sickle, MD sent at 04/14/2016  5:01 PM EDT ----- Please inform patient potassium is normal. Thx

## 2016-04-28 ENCOUNTER — Telehealth: Payer: Self-pay | Admitting: *Deleted

## 2016-04-28 NOTE — Telephone Encounter (Signed)
Notified patient that annual lung cancer screening low dose CT scan is due. Confirmed that patient is within the age range of 55-77, and asymptomatic, (no signs or symptoms of lung cancer). Patient denies illness that would prevent curative treatment for lung cancer if found. The patient is a current smoker, with a 72 pack year history. The shared decision making visit was done 04/17/14. Patient is agreeable for CT scan being scheduled.

## 2016-05-05 ENCOUNTER — Other Ambulatory Visit: Payer: Self-pay | Admitting: *Deleted

## 2016-05-05 DIAGNOSIS — Z87891 Personal history of nicotine dependence: Secondary | ICD-10-CM

## 2016-05-11 ENCOUNTER — Telehealth: Payer: Self-pay | Admitting: Internal Medicine

## 2016-05-11 NOTE — Telephone Encounter (Signed)
I called pt and left a vm to call office to sch AWV. Thank you! °

## 2016-05-19 IMAGING — CR DG LUMBAR SPINE COMPLETE 4+V
1 series · 5 of 5 positions shown · non-contrast
Comparison: None in PACs.

CLINICAL DATA: Left-sided radicular symptoms extending from the
buttock to the left ankle for the past 2 weeks ; no known injury

EXAM:
LUMBAR SPINE - COMPLETE 4+ VIEW

[Series 1: dg lumbar spine complete 4 +v · 0.14mm/px · 5 of 5 slices shown]
[im 1/5]
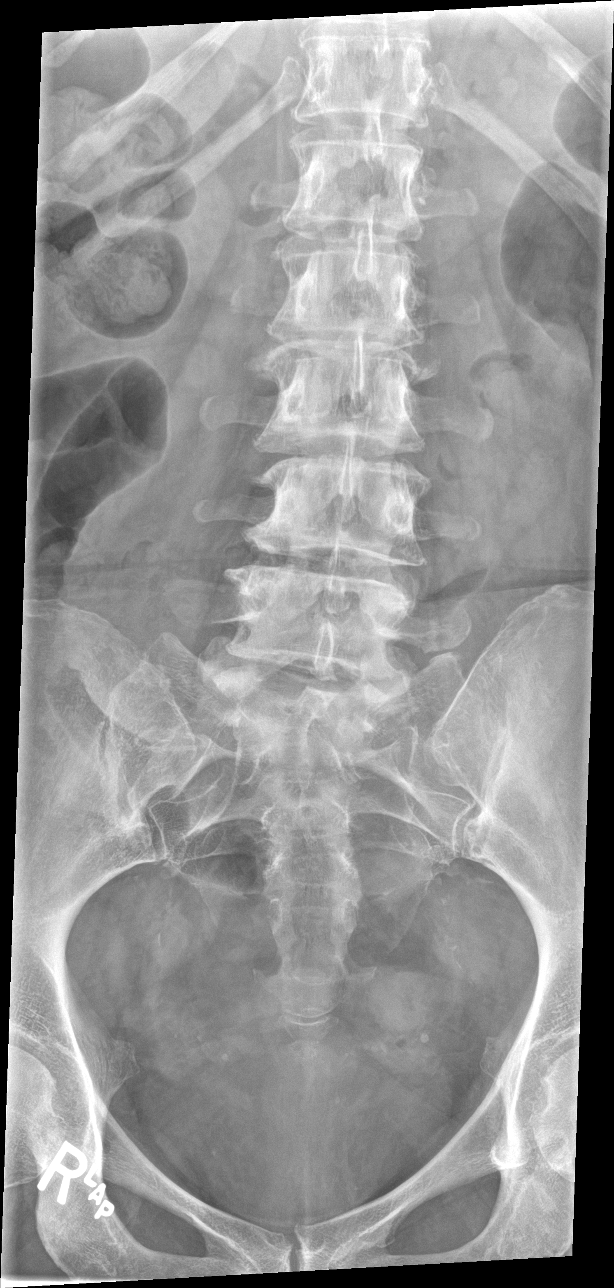
[im 2/5]
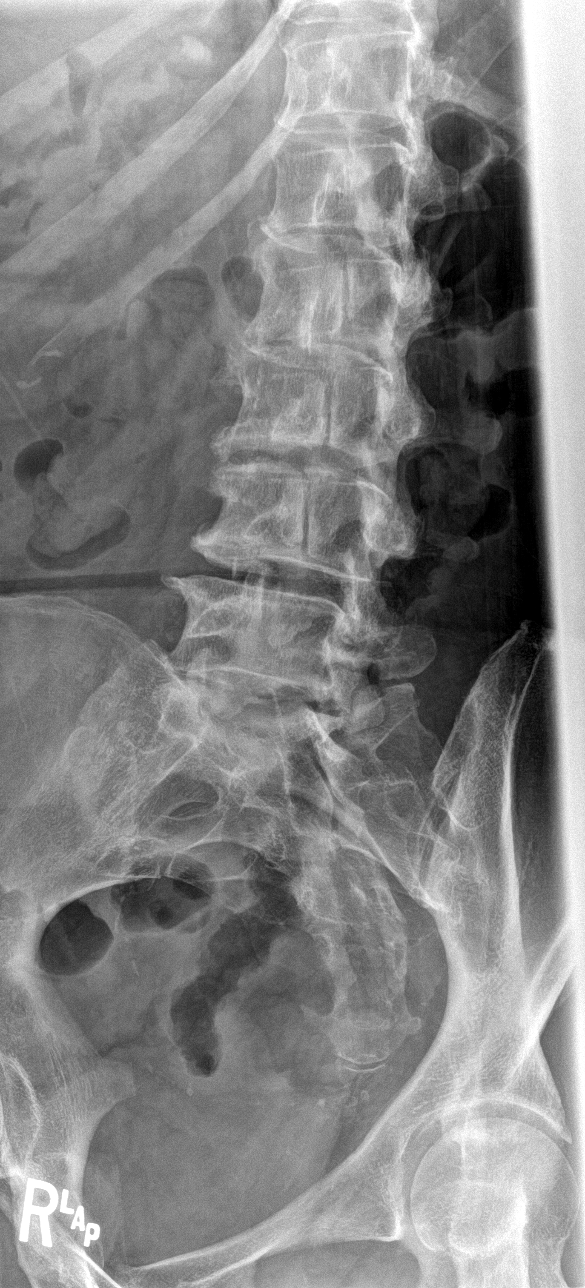
[im 3/5]
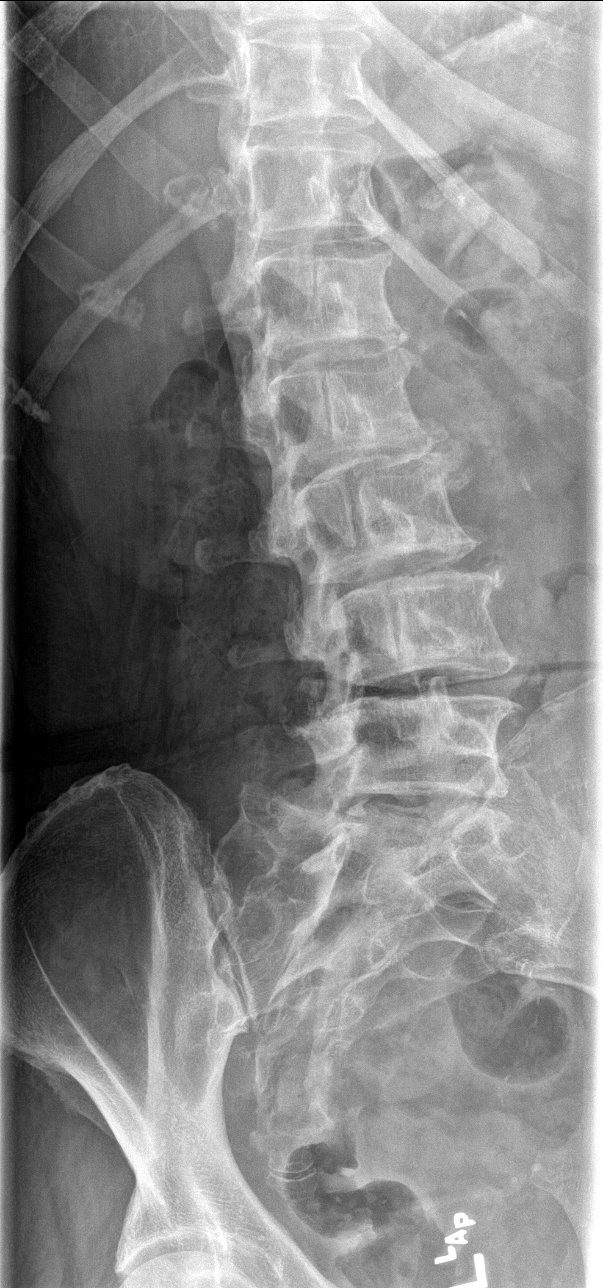
[im 4/5]
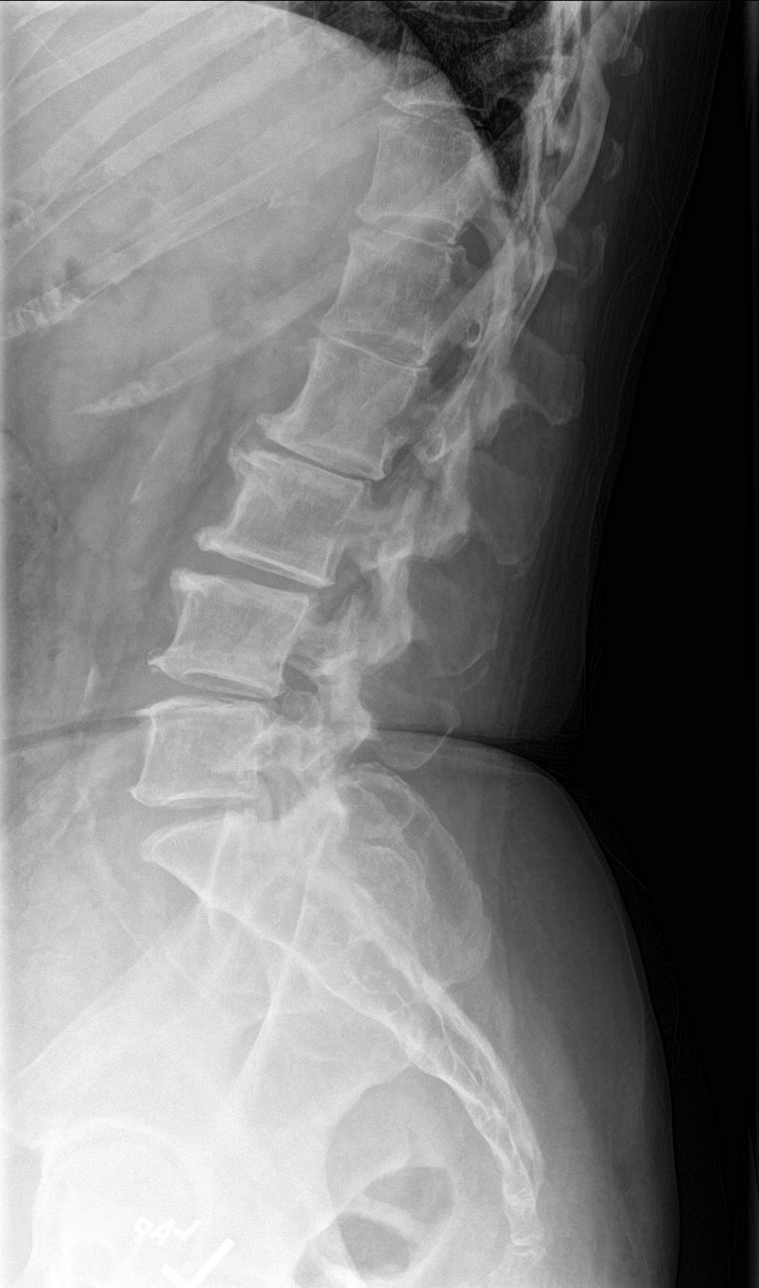
[im 5/5]
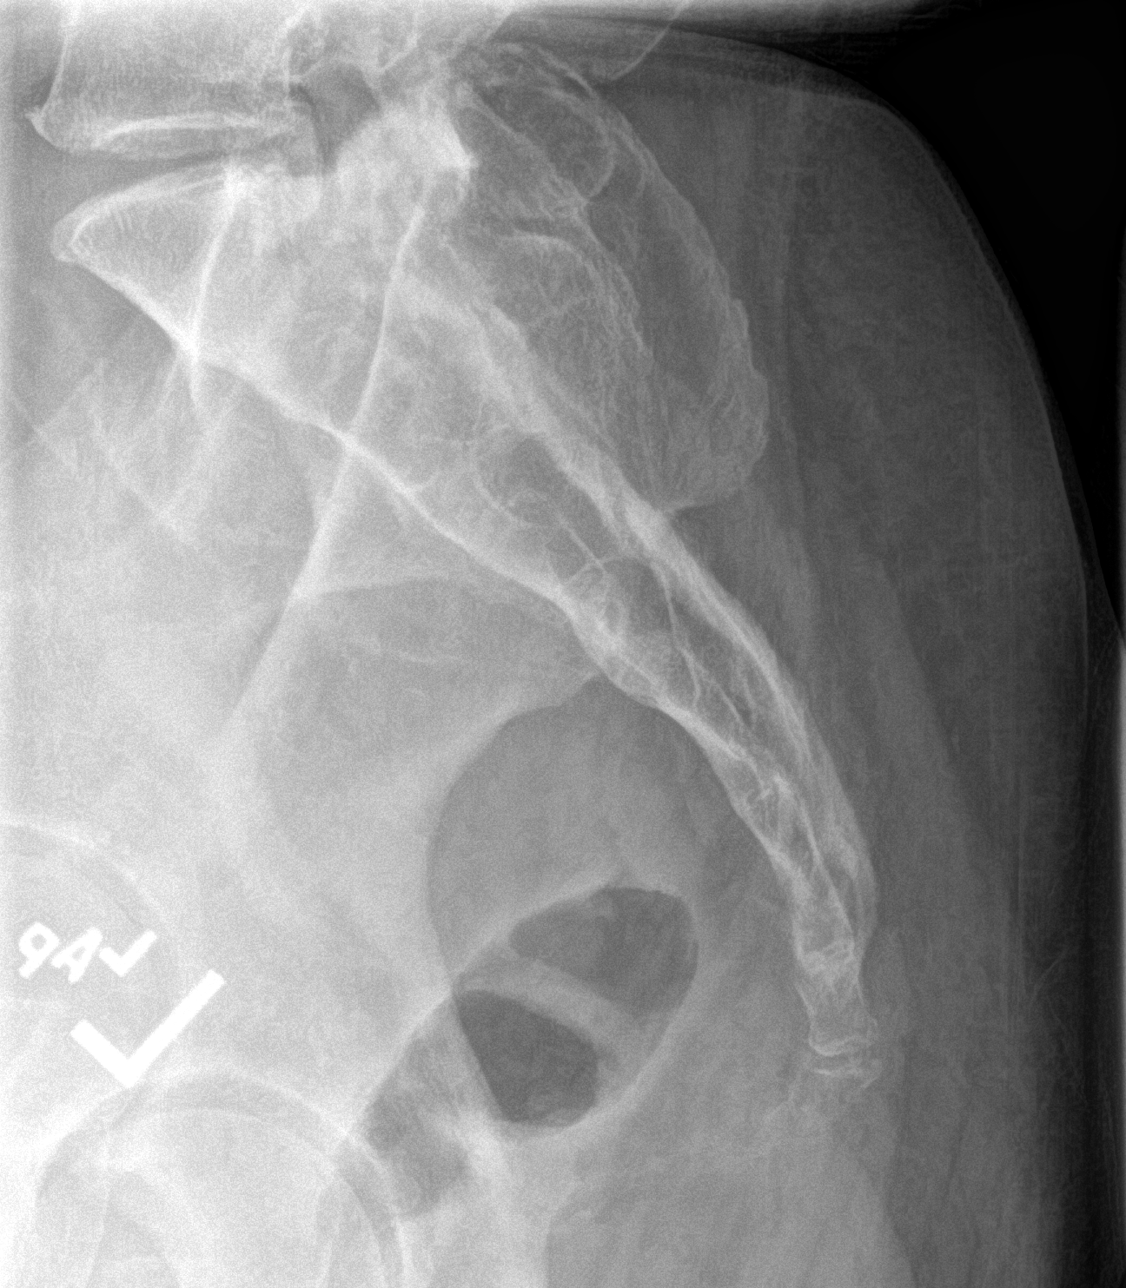

[5 of 5 positions shown; findings below may reference images not displayed]

FINDINGS: The lumbar vertebral bodies are preserved in height. There is
moderate disc space narrowing at L1-2 and L2-3 with milder narrowing
at L3-4 and L4-5 and L5-S1. There is no spondylolisthesis. There are
small anterior endplate osteophytes at multiple lumbar levels. There
is mild facet joint hypertrophy at L5-S1. The pedicles and
transverse processes are intact. The observed portions of the sacrum
are normal.
IMPRESSION: There is mild multilevel degenerative disc disease of the lumbar
spine. There is no compression fracture. Given the patient's
radicular symptoms, lumbar spine MRI would be useful.

## 2016-05-22 ENCOUNTER — Other Ambulatory Visit: Payer: Self-pay | Admitting: Internal Medicine

## 2016-05-24 ENCOUNTER — Ambulatory Visit
Admission: RE | Admit: 2016-05-24 | Discharge: 2016-05-24 | Disposition: A | Discharge status: Home / Self Care | Payer: Medicare Other | Source: Ambulatory Visit | Attending: Oncology | Admitting: Oncology

## 2016-05-24 DIAGNOSIS — I2510 Atherosclerotic heart disease of native coronary artery without angina pectoris: Secondary | ICD-10-CM | POA: Insufficient documentation

## 2016-05-24 DIAGNOSIS — K760 Fatty (change of) liver, not elsewhere classified: Secondary | ICD-10-CM | POA: Insufficient documentation

## 2016-05-24 DIAGNOSIS — I700 Atherosclerosis of aorta: Secondary | ICD-10-CM | POA: Diagnosis not present

## 2016-05-24 DIAGNOSIS — Z87891 Personal history of nicotine dependence: Secondary | ICD-10-CM | POA: Diagnosis not present

## 2016-06-06 ENCOUNTER — Other Ambulatory Visit: Payer: Self-pay | Admitting: Internal Medicine

## 2016-06-08 ENCOUNTER — Telehealth: Payer: Self-pay | Admitting: Internal Medicine

## 2016-06-08 NOTE — Telephone Encounter (Signed)
Pt has declined to get the AWV. Thank you!

## 2016-06-10 ENCOUNTER — Telehealth: Payer: Self-pay | Admitting: *Deleted

## 2016-06-10 NOTE — Telephone Encounter (Signed)
Notified patient of LDCT lung cancer screening results with recommendation for 12 month follow up imaging. Also notified of incidental finding noted below. Patient is encouraged to discuss these with PCP. Patient verbalizes understanding. This note will be forwarded to PCP via Epic.   IMPRESSION: 1. Lung-RADS Category 2, benign appearance or behavior. Continue annual screening with low-dose chest CT without contrast in 12 months. 2.  Coronary artery atherosclerosis. Aortic atherosclerosis. 3. Hepatic steatosis.

## 2016-06-14 ENCOUNTER — Ambulatory Visit: Payer: Medicare Other

## 2016-06-21 ENCOUNTER — Other Ambulatory Visit: Payer: Self-pay | Admitting: Internal Medicine

## 2016-07-12 ENCOUNTER — Other Ambulatory Visit: Payer: Self-pay | Admitting: Internal Medicine

## 2016-07-13 NOTE — Telephone Encounter (Signed)
Last OV with Dr. Derrel Nip 07/03/15. Has no visit scheduled. Last TSH 06/11/15.

## 2016-08-10 ENCOUNTER — Telehealth: Payer: Self-pay | Admitting: Internal Medicine

## 2016-08-10 DIAGNOSIS — E785 Hyperlipidemia, unspecified: Secondary | ICD-10-CM

## 2016-08-10 DIAGNOSIS — E034 Atrophy of thyroid (acquired): Secondary | ICD-10-CM

## 2016-08-10 DIAGNOSIS — E559 Vitamin D deficiency, unspecified: Secondary | ICD-10-CM

## 2016-08-10 DIAGNOSIS — R7301 Impaired fasting glucose: Secondary | ICD-10-CM

## 2016-08-10 DIAGNOSIS — I10 Essential (primary) hypertension: Secondary | ICD-10-CM

## 2016-08-10 NOTE — Telephone Encounter (Signed)
Yes , and fasting labs ordered.  Needs labs done prior to visit .

## 2016-08-10 NOTE — Telephone Encounter (Signed)
Pt last OV was on 09/30/15 and last labs were on 06/11/15.  Ok to refill for 30 days until next appt on 09/05/16?

## 2016-08-11 ENCOUNTER — Encounter: Payer: Self-pay | Admitting: Internal Medicine

## 2016-08-11 ENCOUNTER — Encounter: Payer: Self-pay | Admitting: Radiology

## 2016-08-11 NOTE — Telephone Encounter (Signed)
Pt was sent my chart message to call and schedule lab appt.

## 2016-08-11 NOTE — Telephone Encounter (Signed)
Tried call pt several times. Vm is not set up. Could not schedule appt.

## 2016-08-11 NOTE — Telephone Encounter (Signed)
Could you call pt for lab visit prior to next appt on 09/05/16. Pt needs to keep appt before any further refills? Thank you.

## 2016-08-23 ENCOUNTER — Other Ambulatory Visit: Payer: Self-pay | Admitting: Internal Medicine

## 2016-08-25 ENCOUNTER — Other Ambulatory Visit: Payer: Self-pay | Admitting: Internal Medicine

## 2016-08-31 ENCOUNTER — Other Ambulatory Visit (INDEPENDENT_AMBULATORY_CARE_PROVIDER_SITE_OTHER): Payer: Medicare Other

## 2016-08-31 DIAGNOSIS — R7301 Impaired fasting glucose: Secondary | ICD-10-CM | POA: Diagnosis not present

## 2016-08-31 DIAGNOSIS — E034 Atrophy of thyroid (acquired): Secondary | ICD-10-CM

## 2016-08-31 DIAGNOSIS — I10 Essential (primary) hypertension: Secondary | ICD-10-CM

## 2016-08-31 DIAGNOSIS — E559 Vitamin D deficiency, unspecified: Secondary | ICD-10-CM

## 2016-08-31 DIAGNOSIS — E785 Hyperlipidemia, unspecified: Secondary | ICD-10-CM

## 2016-08-31 LAB — TSH: TSH: 2.71 u[IU]/mL (ref 0.35–4.50)

## 2016-08-31 LAB — COMPREHENSIVE METABOLIC PANEL
ALBUMIN: 4.2 g/dL (ref 3.5–5.2)
ALT: 22 U/L (ref 0–35)
AST: 21 U/L (ref 0–37)
Alkaline Phosphatase: 49 U/L (ref 39–117)
BILIRUBIN TOTAL: 0.3 mg/dL (ref 0.2–1.2)
BUN: 21 mg/dL (ref 6–23)
CALCIUM: 9.8 mg/dL (ref 8.4–10.5)
CO2: 28 meq/L (ref 19–32)
Chloride: 108 mEq/L (ref 96–112)
Creatinine, Ser: 0.97 mg/dL (ref 0.40–1.20)
GFR: 60.66 mL/min (ref >60.00)
Glucose, Bld: 120 mg/dL — ABNORMAL HIGH (ref 70–99)
Potassium: 4.4 mEq/L (ref 3.5–5.1)
Sodium: 138 mEq/L (ref 135–145)
Total Protein: 6.5 g/dL (ref 6.0–8.3)

## 2016-08-31 LAB — HEMOGLOBIN A1C: HEMOGLOBIN A1C: 6.3 % (ref 4.6–6.5)

## 2016-08-31 LAB — MICROALBUMIN / CREATININE URINE RATIO
CREATININE, U: 73.3 mg/dL
MICROALB/CREAT RATIO: 1 mg/g (ref 0.0–30.0)

## 2016-08-31 LAB — LIPID PANEL
CHOLESTEROL: 145 mg/dL (ref 0–200)
HDL: 50.9 mg/dL (ref >39.00)
LDL Cholesterol: 72 mg/dL (ref 0–99)
NONHDL: 94.54
Total CHOL/HDL Ratio: 3
Triglycerides: 114 mg/dL (ref 0.0–149.0)
VLDL: 22.8 mg/dL (ref 0.0–40.0)

## 2016-08-31 LAB — VITAMIN D 25 HYDROXY (VIT D DEFICIENCY, FRACTURES): VITD: 29.29 ng/mL — AB (ref 30.00–100.00)

## 2016-08-31 LAB — LDL CHOLESTEROL, DIRECT: LDL DIRECT: 71 mg/dL

## 2016-09-04 ENCOUNTER — Encounter: Payer: Self-pay | Admitting: Internal Medicine

## 2016-09-04 ENCOUNTER — Other Ambulatory Visit: Payer: Self-pay | Admitting: Internal Medicine

## 2016-09-05 ENCOUNTER — Encounter: Payer: Self-pay | Admitting: Internal Medicine

## 2016-09-05 ENCOUNTER — Ambulatory Visit (INDEPENDENT_AMBULATORY_CARE_PROVIDER_SITE_OTHER): Payer: Medicare Other | Admitting: Internal Medicine

## 2016-09-05 VITALS — BP 132/70 | HR 73 | Temp 98.1°F | Resp 16 | Wt 190.0 lb

## 2016-09-05 DIAGNOSIS — J439 Emphysema, unspecified: Secondary | ICD-10-CM

## 2016-09-05 DIAGNOSIS — Z23 Encounter for immunization: Secondary | ICD-10-CM

## 2016-09-05 DIAGNOSIS — I209 Angina pectoris, unspecified: Secondary | ICD-10-CM

## 2016-09-05 DIAGNOSIS — G500 Trigeminal neuralgia: Secondary | ICD-10-CM | POA: Diagnosis not present

## 2016-09-05 DIAGNOSIS — Z720 Tobacco use: Secondary | ICD-10-CM | POA: Diagnosis not present

## 2016-09-05 DIAGNOSIS — E034 Atrophy of thyroid (acquired): Secondary | ICD-10-CM

## 2016-09-05 DIAGNOSIS — I208 Other forms of angina pectoris: Secondary | ICD-10-CM

## 2016-09-05 MED ORDER — LEVOTHYROXINE SODIUM 75 MCG PO TABS: 75.0000 ug | ORAL_TABLET | Freq: Every day | ORAL | 3 refills | Status: DC

## 2016-09-05 MED ORDER — OMEPRAZOLE 40 MG PO CPDR: 40.0000 mg | DELAYED_RELEASE_CAPSULE | Freq: Every day | ORAL | 3 refills | Status: DC

## 2016-09-05 MED ORDER — FUROSEMIDE 20 MG PO TABS
20.0000 mg | ORAL_TABLET | Freq: Every day | ORAL | 3 refills | Status: DC
Start: 2016-09-05 — End: 2017-04-14

## 2016-09-05 MED ORDER — LISINOPRIL 40 MG PO TABS: ORAL_TABLET | ORAL | 3 refills | Status: DC

## 2016-09-05 MED ORDER — VENLAFAXINE HCL ER 150 MG PO CP24: 150.0000 mg | ORAL_CAPSULE | Freq: Every day | ORAL | 3 refills | Status: DC

## 2016-09-05 MED ORDER — ATORVASTATIN CALCIUM 40 MG PO TABS: 40.0000 mg | ORAL_TABLET | Freq: Every day | ORAL | 3 refills | Status: DC

## 2016-09-05 MED ORDER — NICOTINE 21 MG/24HR TD PT24: 21.0000 mg | MEDICATED_PATCH | Freq: Every day | TRANSDERMAL | 0 refills | Status: DC

## 2016-09-05 MED ORDER — NITROGLYCERIN 0.4 MG SL SUBL: 0.4000 mg | SUBLINGUAL_TABLET | SUBLINGUAL | 3 refills | Status: DC | PRN

## 2016-09-05 MED ORDER — CARVEDILOL 3.125 MG PO TABS: 3.1250 mg | ORAL_TABLET | Freq: Two times a day (BID) | ORAL | 3 refills | Status: DC

## 2016-09-05 NOTE — Progress Notes (Signed)
Pre visit review using our clinic review tool, if applicable. No additional management support is needed unless otherwise documented below in the visit note. 

## 2016-09-05 NOTE — Progress Notes (Signed)
Subjective:  Patient ID: Crystal Meyers, female    DOB: 02/26/1946  Age: 71 y.o. MRN: [id number]  CC: The primary encounter diagnosis was Stable angina pectoris ([place]). Diagnoses of Encounter for immunization, Hypothyroidism due to acquired atrophy of thyroid, Current tobacco use, Trigeminal neuralgia of right side of face, Angina pectoris ([place]), and Pulmonary emphysema, unspecified emphysema type ([place]) were also pertinent to this visit.  HPI Crystal Meyers presents for follow up on chronic issues.  She was last seen  seen dec 2016.  She is accompanied by her daughter who provides additional background information about her troubled home life.    Trigeminal neuralgia : underwent right retromastoid crainectomy in June 2017.  Surgery was complicated by a csf leak requiring  A second surgery and a prlonged  ICU stay of 30 days.  Her pain symptoms have improved significantly but she she continues to have episodes of facial pain .   Has been reporting substernal chest pain with physical activity, not occurring daily.  Patient minimizes the severity and the occurrence of the pain,  But daughter  States that patient reports having it at least once a week and it is occasionally accompanied by left shoulder pain.  Patient is maintaining a household for 5 people including a great grandson < 21 years of age,  The mother of the baby and the baby's father.  Tow of the 3 adults are working full time,  None are paying her rent,  And she is providing laundry and food for all. She has been repeatedly solicitous of assistance with maintaining the house,  But none of the adults have contributed to the effort required, including doing their own laundry,  Vacuuming and cleaning. She  stays up very late to have "quiet time" but gets up early to tend to the 69 yr old. .    Ct chest Nov 2017: stable 2.6 mm LUL left nodule .  CAD and aortic atherosclerosis noted .  fatty liver  noted   History of BRCA .  Bilateral  mastectomies   She has resumed  smoking  And is averaging a little under 2 packs daily.  States that it helps her relax.  Under a lot of stress.  Trouble sleeping. Spent 3 minutes discussing risk of continued tobacco abuse, including but not limited to CAD, PAD, hypertension, and CA.  He is not interested in pharmacotherapy at this time.   Outpatient Medications Prior to Visit  Medication Sig Dispense Refill  . aspirin EC 81 MG tablet Take 81 mg by mouth daily.    [name] levothyroxine (SYNTHROID, LEVOTHROID) 75 MCG tablet TAKE 1 TABLET BY MOUTH EVERY DAY 30 tablet 0  . levothyroxine (SYNTHROID, LEVOTHROID) 75 MCG tablet TAKE 1 TABLET BY MOUTH EVERY DAY 30 tablet 0  . lisinopril (PRINIVIL,ZESTRIL) 40 MG tablet TAKE 0.5 TABLET BY MOUTH DAILY 45 tablet 1  . lisinopril (PRINIVIL,ZESTRIL) 40 MG tablet TAKE 1 TABLET BY MOUTH EVERY DAY. NEEDS APPOINTMENT FOR FURTHER REFILLS, CONTACT OFFICE. 30 tablet 0  . omeprazole (PRILOSEC) 40 MG capsule TAKE 1 CAPSULE BY MOUTH EVERY DAY 30 capsule 0  . simvastatin (ZOCOR) 20 MG tablet TAKE 1 TABLET(20 MG) BY MOUTH AT BEDTIME 90 tablet 2  . venlafaxine XR (EFFEXOR-XR) 150 MG 24 hr capsule TAKE 1 CAPSULE BY MOUTH EVERY DAY 90 capsule 0  . venlafaxine XR (EFFEXOR-XR) 150 MG 24 hr capsule TAKE ONE CAPSULE BY MOUTH EVERY DAY 90 capsule 0   No facility-administered medications prior to  visit.     Review of Systems;  Patient denies headache, fevers, malaise, unintentional weight loss, skin rash, eye pain, sinus congestion and sinus pain, sore throat, dysphagia,  hemoptysis , cough, dyspnea, wheezing, chest pain, palpitations, orthopnea, edema, abdominal pain, nausea, melena, diarrhea, constipation, flank pain, dysuria, hematuria, urinary  Frequency, nocturia, numbness, tingling, seizures,  Focal weakness, Loss of consciousness,  Tremor, insomnia, depression, anxiety, and suicidal ideation.      Objective:  BP 132/70   Pulse 73   Temp 98.1 F (36.7 C) (Oral)   Resp  16   Wt 190 lb (86.2 kg)   SpO2 97%   BMI 30.21 kg/m   BP Readings from Last 3 Encounters:  09/05/16 132/70  04/14/16 135/84  02/09/16 130/85    Wt Readings from Last 3 Encounters:  09/05/16 190 lb (86.2 kg)  05/24/16 180 lb (81.6 kg)  04/14/16 180 lb (81.6 kg)    General appearance: alert, cooperative and appears stated age Ears: normal TM's and external ear canals both ears Throat: lips, mucosa, and tongue normal; teeth and gums normal Neck: no adenopathy, no carotid bruit, supple, symmetrical, trachea midline and thyroid not enlarged, symmetric, no tenderness/mass/nodules Back: symmetric, no curvature. ROM normal. No CVA tenderness. Lungs: clear to auscultation bilaterally Heart: regular rate and rhythm, S1, S2 normal, no murmur, click, rub or gallop Abdomen: soft, non-tender; bowel sounds normal; no masses,  no organomegaly Pulses: 2+ and symmetric Skin: Skin color, texture, turgor normal. No rashes or lesions Lymph nodes: Cervical, supraclavicular, and axillary nodes normal.  Lab Results  Component Value Date   HGBA1C 6.3 08/31/2016   HGBA1C 5.9 02/09/2015   HGBA1C 5.7 10/25/2013    Lab Results  Component Value Date   CREATININE 0.97 08/31/2016   CREATININE 1.09 (H) 04/14/2016   CREATININE 0.98 02/09/2016    Lab Results  Component Value Date   WBC 6.5 04/14/2016   HGB 13.1 04/14/2016   HCT 37.2 04/14/2016   PLT 232 04/14/2016   GLUCOSE 120 (H) 08/31/2016   CHOL 145 08/31/2016   TRIG 114.0 08/31/2016   HDL 50.90 08/31/2016   LDLDIRECT 71.0 08/31/2016   LDLCALC 72 08/31/2016   ALT 22 08/31/2016   AST 21 08/31/2016   NA 138 08/31/2016   K 4.4 08/31/2016   CL 108 08/31/2016   CREATININE 0.97 08/31/2016   BUN 21 08/31/2016   CO2 28 08/31/2016   TSH 2.71 08/31/2016   HGBA1C 6.3 08/31/2016   MICROALBUR <0.7 08/31/2016    Ct Chest Lung Cancer Screening Low Dose Wo Contrast  Result Date: 05/25/2016 CLINICAL DATA:  Lung cancer screening.  Asymptomatic. Seventy-two pack-year history. Left breast cancer with bilateral mastectomies. EXAM: CT CHEST WITHOUT CONTRAST LOW-DOSE FOR LUNG CANCER SCREENING TECHNIQUE: Multidetector CT imaging of the chest was performed following the standard protocol without IV contrast. COMPARISON:  05/11/2015 FINDINGS: Cardiovascular: Aortic atherosclerosis. Normal heart size, without pericardial effusion. Lad coronary artery atherosclerosis. Mediastinum/Nodes: Bilateral breast implants. No axillary adenopathy. No mediastinal or definite hilar adenopathy, given limitations of unenhanced CT. Lungs/Pleura: No pleural fluid.  Mild centrilobular emphysema. Posterior left upper lobe pulmonary nodule which measures volume derived equivalent diameter 2.6 mm unchanged. The superior segment left lower lobe pulmonary nodule described on the prior exam is no longer identified. A calcified lingular nodule is similar. Upper Abdomen: Mild hepatic steatosis. Normal imaged portions of the spleen, stomach, pancreas, adrenal glands, left kidney. Musculoskeletal: No acute osseous abnormality. IMPRESSION: 1. Lung-RADS Category 2, benign appearance or behavior. Continue annual  screening with low-dose chest CT without contrast in 12 months. 2.  Coronary artery atherosclerosis. Aortic atherosclerosis. 3. Hepatic steatosis. Electronically Signed   By: Abigail Miyamoto M.D.   On: 05/25/2016 09:56    Assessment & Plan:   Problem List Items Addressed This Visit    Angina pectoris ([place])    SL NTG and carvedilol prescribed.    REFERRAL TO [facility] FOR EVALUATION.  Has CADA and aortic atherosclerosis by prior imaging studies .  Taking statin but smokign 2 packs cis daily.  patient is a retired Warden/ranger.       Relevant Medications   lisinopril (PRINIVIL,ZESTRIL) 40 MG tablet   atorvastatin (LIPITOR) 40 MG tablet   nitroGLYCERIN (NITROSTAT) 0.4 MG SL tablet   furosemide (LASIX) 20 MG tablet   carvedilol (COREG) 3.125 MG tablet   COPD  (chronic obstructive pulmonary disease) with emphysema ([facility])    Secondary to tobacco abuse.  Not using inhaled therapy.  Still smoking.  Has OSA as well.       Relevant Medications   nicotine (NICODERM CQ) 21 mg/24hr patch   Current tobacco use    Spent 3 minutes discussing risk of continued tobacco abuse, including but not limited to CAD, PAD, hypertension, and CA.  she is not interested in pharmacotherapy at this time.      Hypothyroidism    Thyroid function is WNL on current dose.  No current changes needed.   Lab Results  Component Value Date   TSH 2.71 08/31/2016         Relevant Medications   levothyroxine (SYNTHROID, LEVOTHROID) 75 MCG tablet   carvedilol (COREG) 3.125 MG tablet   Trigeminal neuralgia of right side of face    S/p craniectomy June 2017 for persistent symptoms,  Surgery complicated by csf leak resulting in prolonged ICU stay. Symptoms improved but not completely resolved.      Relevant Medications   venlafaxine XR (EFFEXOR-XR) 150 MG 24 hr capsule   nicotine (NICODERM CQ) 21 mg/24hr patch    Other Visit Diagnoses    Stable angina pectoris ([facility])    -  Primary   Relevant Medications   lisinopril (PRINIVIL,ZESTRIL) 40 MG tablet   atorvastatin (LIPITOR) 40 MG tablet   nitroGLYCERIN (NITROSTAT) 0.4 MG SL tablet   furosemide (LASIX) 20 MG tablet   carvedilol (COREG) 3.125 MG tablet   Other Relevant Orders   Ambulatory referral to Cardiology   Encounter for immunization       Relevant Orders   Flu Vaccine QUAD 36+ mos IM (Completed)    A total of 40 minutes was spent with patient more than half of which was spent in counseling patient on the above mentioned issues , reviewing and explaining recent labs and imaging studies done, and coordination of care.  I have discontinued Ms. Lalani's simvastatin, venlafaxine XR, and levothyroxine. I have also changed her levothyroxine, omeprazole, venlafaxine XR, and atorvastatin. Additionally, I am having her start on  nitroGLYCERIN, furosemide, nicotine, and carvedilol. Lastly, I am having her maintain her aspirin EC and lisinopril.  Meds ordered this encounter  Medications  . levothyroxine (SYNTHROID, LEVOTHROID) 75 MCG tablet    Sig: Take 1 tablet (75 mcg total) by mouth daily.    Dispense:  90 tablet    Refill:  3  . lisinopril (PRINIVIL,ZESTRIL) 40 MG tablet    Sig: TAKE 0.5 TABLET BY MOUTH DAILY    Dispense:  45 tablet    Refill:  3  . omeprazole (PRILOSEC)  40 MG capsule    Sig: Take 1 capsule (40 mg total) by mouth daily.    Dispense:  90 capsule    Refill:  3  . venlafaxine XR (EFFEXOR-XR) 150 MG 24 hr capsule    Sig: Take 1 capsule (150 mg total) by mouth daily.    Dispense:  90 capsule    Refill:  3  . DISCONTD: atorvastatin (LIPITOR) 40 MG tablet    Sig: Take 40 mg by mouth daily.  [name] atorvastatin (LIPITOR) 40 MG tablet    Sig: Take 1 tablet (40 mg total) by mouth daily.    Dispense:  90 tablet    Refill:  3  . nitroGLYCERIN (NITROSTAT) 0.4 MG SL tablet    Sig: Place 1 tablet (0.4 mg total) under the tongue every 5 (five) minutes as needed for chest pain.    Dispense:  30 tablet    Refill:  3  . furosemide (LASIX) 20 MG tablet    Sig: Take 1 tablet (20 mg total) by mouth daily.    Dispense:  30 tablet    Refill:  3  . nicotine (NICODERM CQ) 21 mg/24hr patch    Sig: Place 1 patch (21 mg total) onto the skin daily.    Dispense:  28 patch    Refill:  0  . carvedilol (COREG) 3.125 MG tablet    Sig: Take 1 tablet (3.125 mg total) by mouth 2 (two) times daily with a meal.    Dispense:  60 tablet    Refill:  3    Medications Discontinued During This Encounter  Medication Reason  . lisinopril (PRINIVIL,ZESTRIL) 40 MG tablet Duplicate  . levothyroxine (SYNTHROID, LEVOTHROID) 75 MCG tablet Duplicate  . venlafaxine XR (EFFEXOR-XR) 150 MG 24 hr capsule   . simvastatin (ZOCOR) 20 MG tablet Change in therapy  . levothyroxine (SYNTHROID, LEVOTHROID) 75 MCG tablet Reorder  . lisinopril  (PRINIVIL,ZESTRIL) 40 MG tablet Reorder  . omeprazole (PRILOSEC) 40 MG capsule Reorder  . venlafaxine XR (EFFEXOR-XR) 150 MG 24 hr capsule Reorder  . atorvastatin (LIPITOR) 40 MG tablet Reorder    Follow-up: No Follow-up on file.   [name], MD

## 2016-09-05 NOTE — Patient Instructions (Addendum)
I am prescribing nitroglycerin sublingual tablets to take for your next episode of chest pain  Carvedilol (beta blocker) twice daily   I am making a referral for you to see our cardiologist .    Furosemide 20 mg daily for fluid retention  Your ocugh is coming from chronic bronchitis.  Caused by smoking    Angina Pectoris Angina pectoris, often called angina, is extreme discomfort in the chest, neck, or arm. This is caused by a lack of blood in the middle and thickest layer of the heart wall (myocardium). There are four types of angina:  Stable angina. Stable angina usually occurs in episodes of predictable frequency and duration. It is usually brought on by physical activity, stress, or excitement. Stable angina usually lasts a few minutes and can often be relieved by a medicine that you place under your tongue. This medicine is called sublingual nitroglycerin.  Unstable angina. Unstable angina can occur even when you are doing little or no physical activity. It can even occur while you are sleeping or when you are at rest. It can suddenly increase in severity or frequency. It may not be relieved by sublingual nitroglycerin, and it can last up to 30 minutes.  Microvascular angina. This type of angina is caused by a disorder of tiny blood vessels called arterioles. Microvascular angina is more common in women. The pain may be more severe and last longer than other types of angina pectoris.  Prinzmetal or variant angina. This type of angina pectoris is rare and usually occurs when you are doing little or no physical activity. It especially occurs in the early morning hours. What are the causes? Atherosclerosis is the cause of angina. This is the buildup of fat and cholesterol (plaque) on the inside of the arteries. Over time, the plaque may narrow or block the artery, and this will lessen blood flow to the heart. Plaque can also become weak and break off within a coronary artery to form a clot  and cause a sudden blockage. What increases the risk? Risk factors common to both men and women include:  High cholesterol levels.  High blood pressure (hypertension).  Tobacco use.  Diabetes.  Family history of angina.  Obesity.  Lack of exercise.  A diet high in saturated fats. Women are at greater risk for angina if they are:  Over age 82.  Postmenopausal. What are the signs or symptoms? Many people do not experience any symptoms during the early stages of angina. As the condition progresses, symptoms common to both men and women may include:  Chest pain.  The pain can be described as a crushing or squeezing in the chest, or a tightness, pressure, fullness, or heaviness in the chest.  The pain can last more than a few minutes, or it can stop and recur.  Pain in the arms, neck, jaw, or back.  Unexplained heartburn or indigestion.  Shortness of breath.  Nausea.  Sudden cold sweats.  Sudden light-headedness. Many women have chest discomfort and some of the other symptoms. However, women often have different (atypical) symptoms, such as:  Fatigue.  Unexplained feelings of nervousness or anxiety.  Unexplained weakness.  Dizziness or fainting. Sometimes, women may have angina without any symptoms. How is this diagnosed? Tests to diagnose angina may include:  ECG (electrocardiogram).  Exercise stress test. This looks for signs of blockage when the heart is being exercised.  Pharmacologic stress test. This test looks for signs of blockage when the heart is being stressed with  a medicine.  Blood tests.  Coronary angiogram. This is a procedure to look at the coronary arteries to see if there is any blockage. How is this treated? The treatment of angina may include the following:  Healthy behavioral changes to reduce or control risk factors.  Medicine.  Coronary stenting.A stent helps to keep an artery open.  Coronary angioplasty. This procedure  widens a narrowed or blocked artery.  Coronary arterybypass surgery. This will allow your blood to pass the blockage (bypass) to reach your heart. Follow these instructions at home:  Take medicines only as directed by your health care provider.  Do not take the following medicines unless your health care provider approves:  Nonsteroidal anti-inflammatory drugs (NSAIDs), such as ibuprofen, naproxen, or celecoxib.  Vitamin supplements that contain vitamin A, vitamin E, or both.  Hormone replacement therapy that contains estrogen with or without progestin.  Manage other health conditions such as hypertension and diabetes as directed by your health care provider.  Follow a heart-healthy diet. A dietitian can help to educate you about healthy food options and changes.  Use healthy cooking methods such as roasting, grilling, broiling, baking, poaching, steaming, or stir-frying. Talk to a dietitian to learn more about healthy cooking methods.  Follow an exercise program approved by your health care provider.  Maintain a healthy weight. Lose weight as approved by your health care provider.  Plan rest periods when fatigued.  Learn to manage stress.  Do not use any tobacco products, including cigarettes, chewing tobacco, or electronic cigarettes. If you need help quitting, ask your health care provider.  If you drink alcohol, and your health care provider approves, limit your alcohol intake to no more than 1 drink per day. One drink equals 12 ounces of beer, 5 ounces of wine, or 1 ounces of hard liquor.  Stop illegal drug use.  Keep all follow-up visits as directed by your health care provider. This is important. Get help right away if:  You have pain in your chest, neck, arm, jaw, stomach, or back that lasts more than a few minutes, is recurring, or is unrelieved by taking sublingualnitroglycerin.  You have profuse sweating without cause.  You have unexplained:  Heartburn or  indigestion.  Shortness of breath or difficulty breathing.  Nausea or vomiting.  Fatigue.  Feelings of nervousness or anxiety.  Weakness.  Diarrhea.  You have sudden light-headedness or dizziness.  You faint. These symptoms may represent a serious problem that is an emergency. Do not wait to see if the symptoms will go away. Get medical help right away. Call your local emergency services (911 in the [place] Do not drive yourself to the hospital.  This information is not intended to replace advice given to you by your health care provider. Make sure you discuss any questions you have with your health care provider. Document Released: 06/27/2005 Document Revised: 12/09/2015 Document Reviewed: 10/29/2013 Elsevier Interactive Patient Education  2017 [employer].

## 2016-09-06 DIAGNOSIS — I209 Angina pectoris, unspecified: Secondary | ICD-10-CM | POA: Insufficient documentation

## 2016-09-06 NOTE — Assessment & Plan Note (Signed)
Thyroid function is WNL on current dose.  No current changes needed.   Lab Results  Component Value Date   TSH 2.71 08/31/2016    

## 2016-09-06 NOTE — Assessment & Plan Note (Signed)
S/p craniectomy June 2017 for persistent symptoms,  Surgery complicated by csf leak resulting in prolonged ICU stay. Symptoms improved but not completely resolved.

## 2016-09-06 NOTE — Assessment & Plan Note (Signed)
Spent 3 minutes discussing risk of continued tobacco abuse, including but not limited to CAD, PAD, hypertension, and CA. she is not interested in pharmacotherapy at this time. 

## 2016-09-06 NOTE — Assessment & Plan Note (Addendum)
Secondary to tobacco abuse.  Not using inhaled therapy.  Still smoking.  Has OSA as well.

## 2016-09-06 NOTE — Assessment & Plan Note (Signed)
SL NTG and carvedilol prescribed.    REFERRAL TO [facility] FOR EVALUATION.  Has CADA and aortic atherosclerosis by prior imaging studies .  Taking statin but smokign 2 packs cis daily.  patient is a retired Warden/ranger.

## 2016-09-13 ENCOUNTER — Other Ambulatory Visit: Payer: Self-pay | Admitting: Internal Medicine

## 2016-09-27 DIAGNOSIS — L820 Inflamed seborrheic keratosis: Secondary | ICD-10-CM | POA: Diagnosis not present

## 2016-09-27 DIAGNOSIS — Z85828 Personal history of other malignant neoplasm of skin: Secondary | ICD-10-CM | POA: Diagnosis not present

## 2016-09-27 DIAGNOSIS — B351 Tinea unguium: Secondary | ICD-10-CM | POA: Diagnosis not present

## 2016-09-27 DIAGNOSIS — L0109 Other impetigo: Secondary | ICD-10-CM | POA: Diagnosis not present

## 2016-09-27 DIAGNOSIS — D1800 Hemangioma unspecified site: Secondary | ICD-10-CM | POA: Diagnosis not present

## 2016-09-27 DIAGNOSIS — L821 Other seborrheic keratosis: Secondary | ICD-10-CM | POA: Diagnosis not present

## 2016-09-28 ENCOUNTER — Ambulatory Visit: Payer: Medicare Other | Admitting: Internal Medicine

## 2016-09-28 NOTE — Progress Notes (Signed)
Cardiology Office Note   Date:  09/29/2016   ID:  Crystal Meyers, DOB 11-04-1945, MRN [id number]  Referring Doctor:  [name], MD   Cardiologist:   [name], MD   Reason for consultation:  Chief Complaint  Patient presents with  . OTHER    C/o chest pain and sob. Meds reviewed verbally with pt.      History of Present Illness: Crystal Meyers is a 71 y.o. female who presents for History of chest pain and shortness of breath.  Three-week history of chest pain, described as tightness in the chest, on and off, lasting a few minutes at times, sometimes radiating to the left shoulder and to the back area usually occurs with exertion. Symptoms occurring with laying down or with stress. Moderate in intensity.  Also has noticed shortness of breath more noticeable in the last several weeks. With exertion, moderate in severity, resolving with rest.  Noted to have coronary artery atherosclerosis and aortic atherosclerosis from CT chest screening, 05/24/2016.  She continues to smoke, has been trying her best to stop.  ROS:  Please see the history of present illness. Aside from mentioned under HPI, all other systems are reviewed and negative.     Past Medical History:  Diagnosis Date  . Barrett's esophagus   . Breast cancer ([place]) 7/10   left , invasive lobular carcinoma  . Complication of anesthesia    difficulty waking up and sleep apnea, low sats  . COPD (chronic obstructive pulmonary disease) ([place])   . Coronary artery disease   . Depression   . GERD (gastroesophageal reflux disease)   . Hypercholesteremia   . Hypertension   . Hypothyroidism   . Personal history of tobacco use, presenting hazards to health 05/06/2015  . Personal history of tobacco use, presenting hazards to health 05/06/2015  . Pneumonia    hx of  . PVC (premature ventricular contraction)   . Sleep apnea    wears CPAP nightly  . Trigeminal neuralgia     Past Surgical History:  Procedure  Laterality Date  . BREAST SURGERY Bilateral 2011   dbl mastectomy  . COLONOSCOPY W/ POLYPECTOMY    . CRANIECTOMY Right 01/21/2016   Procedure: Microvascular Decompression - right trigemminal nerve;  Surgeon: [name], MD;  Location: [place] [facility];  Service: Neurosurgery;  Laterality: Right;  . CRANIOTOMY Right 02/03/2016   Procedure: REPAIR OF CEREBROSPINAL FLUID LEAK AND HARVEST ABDOMINAL FAT GRAFT;  Surgeon: [name], MD;  Location: [place] [facility];  Service: Neurosurgery;  Laterality: Right;  . LUMBAR LAMINECTOMY/DECOMPRESSION MICRODISCECTOMY Left 08/11/2015   Procedure: Left Lumbar four-five microdiscectomy;  Surgeon: [name], MD;  Location: [place] [facility];  Service: Neurosurgery;  Laterality: Left;  [name] MASTECTOMY  05/2009   bilateral, reconstructive surgery 09/2009  . MASTECTOMY     Bilateral      reports that she has been smoking Cigarettes.  She has a 54.00 pack-year smoking history. She has never used smokeless tobacco. She reports that she does not drink alcohol or use drugs.   family history includes Diabetes in her mother; Heart attack in her father; Hyperlipidemia in her father and mother; Hypertension in her father and mother; Mental illness in her mother; Stroke in her father.   Outpatient Medications Prior to Visit  Medication Sig Dispense Refill  . aspirin EC 81 MG tablet Take 81 mg by mouth daily.    [name] atorvastatin (LIPITOR) 40 MG tablet Take 1 tablet (40 mg total)  by mouth daily. 90 tablet 3  . carvedilol (COREG) 3.125 MG tablet Take 1 tablet (3.125 mg total) by mouth 2 (two) times daily with a meal. 60 tablet 3  . furosemide (LASIX) 20 MG tablet Take 1 tablet (20 mg total) by mouth daily. 30 tablet 3  . levothyroxine (SYNTHROID, LEVOTHROID) 75 MCG tablet Take 1 tablet (75 mcg total) by mouth daily. 90 tablet 3  . lisinopril (PRINIVIL,ZESTRIL) 40 MG tablet TAKE 0.5 TABLET BY MOUTH DAILY 45 tablet 3  . nitroGLYCERIN (NITROSTAT) 0.4 MG SL tablet Place 1  tablet (0.4 mg total) under the tongue every 5 (five) minutes as needed for chest pain. 30 tablet 3  . omeprazole (PRILOSEC) 40 MG capsule Take 1 capsule (40 mg total) by mouth daily. 90 capsule 3  . venlafaxine XR (EFFEXOR-XR) 150 MG 24 hr capsule Take 1 capsule (150 mg total) by mouth daily. 90 capsule 3  . nicotine (NICODERM CQ) 21 mg/24hr patch Place 1 patch (21 mg total) onto the skin daily. (Patient not taking: Reported on 09/29/2016) 28 patch 0   No facility-administered medications prior to visit.      Allergies: No known allergies    PHYSICAL EXAM: VS:  BP 120/74 (BP Location: Right Arm, Patient Position: Sitting, Cuff Size: Normal)   Pulse 74   Ht 5\' 6"  (1.676 m)   Wt 193 lb 4 oz (87.7 kg)   BMI 31.19 kg/m  , Body mass index is 31.19 kg/m. Wt Readings from Last 3 Encounters:  09/29/16 193 lb 4 oz (87.7 kg)  09/05/16 190 lb (86.2 kg)  05/24/16 180 lb (81.6 kg)    GENERAL:  well developed, well nourished, obese, not in acute distress HEENT: normocephalic, pink conjunctivae, anicteric sclerae, no xanthelasma, normal dentition, oropharynx clear NECK:  no neck vein engorgement, JVP normal, no hepatojugular reflux, carotid upstroke brisk and symmetric, no bruit, no thyromegaly, no lymphadenopathy LUNGS:  good respiratory effort, clear to auscultation bilaterally CV:  PMI not displaced, no thrills, no lifts, S1 and S2 within normal limits, no palpable S3 or S4, no murmurs, no rubs, no gallops ABD:  Soft, nontender, nondistended, normoactive bowel sounds, no abdominal aortic bruit, no hepatomegaly, no splenomegaly MS: nontender back, no kyphosis, no scoliosis, no joint deformities EXT:  2+ DP/PT pulses, no edema, no varicosities, no cyanosis, no clubbing SKIN: warm, nondiaphoretic, normal turgor, no ulcers NEUROPSYCH: alert, oriented to person, place, and time, sensory/motor grossly intact, normal mood, appropriate affect  Recent Labs: 04/14/2016: Hemoglobin 13.1; Platelets  232 08/31/2016: ALT 22; BUN 21; Creatinine, Ser 0.97; Potassium 4.4; Sodium 138; TSH 2.71   Lipid Panel    Component Value Date/Time   CHOL 145 08/31/2016 1054   TRIG 114.0 08/31/2016 1054   HDL 50.90 08/31/2016 1054   CHOLHDL 3 08/31/2016 1054   VLDL 22.8 08/31/2016 1054   LDLCALC 72 08/31/2016 1054   LDLDIRECT 71.0 08/31/2016 1054     Other studies Reviewed:  EKG:  The ekg from 09/29/2016 was personally reviewed by me and it revealed sinus rhythm, 74 BPM. Low-voltage QRS. Borderline EKG.  Additional studies/ records that were reviewed personally reviewed by me today include: None available   ASSESSMENT AND PLAN: Coronary atherosclerosis Chest pain Shortness of breath Recommend echocardiogram and pharmacologic nuclear stress is per patient unable to walk the treadmill. Agree with aspirin, nitroglycerin sublingual when necessary for chest pain. Patient to call 911 for unrelenting chest pain. Continue medical therapy for hypertension and hyperlipidemia.  HTN BP is well controlled.  Continue monitoring BP. Continue current medical therapy and lifestyle changes.  Hyperlipidemia PCP following labs.  Tobacco use We discussed the importance of smoking cessation and different strategies for quitting.   Current medicines are reviewed at length with the patient today.  The patient does not have concerns regarding medicines.  Labs/ tests ordered today include:  Orders Placed This Encounter  Procedures  . NM Myocar Multi W/Spect W/Wall Motion / EF  . EKG 12-Lead  . ECHOCARDIOGRAM COMPLETE    I had a lengthy and detailed discussion with the patient regarding diagnoses, prognosis, diagnostic options, treatment options , and side effects of medications.   I counseled the patient on importance of lifestyle modification including heart healthy diet, regular physical activityOnce cardiac workup is completed , and smoking cessation.   Disposition:   FU with Cardiology after tests    Thank you for this consultation. We will forwarding this consultation to referring physician.   Signed, [name], MD  09/29/2016 3:54 PM    [place]  This note was generated in part with voice recognition software and I apologize for any typographical errors that were not detected and corrected.

## 2016-09-29 ENCOUNTER — Encounter: Payer: Self-pay | Admitting: Cardiology

## 2016-09-29 ENCOUNTER — Ambulatory Visit (INDEPENDENT_AMBULATORY_CARE_PROVIDER_SITE_OTHER): Payer: Medicare Other | Admitting: Cardiology

## 2016-09-29 VITALS — BP 120/74 | HR 74 | Ht 66.0 in | Wt 193.2 lb

## 2016-09-29 DIAGNOSIS — R0789 Other chest pain: Secondary | ICD-10-CM | POA: Diagnosis not present

## 2016-09-29 DIAGNOSIS — I10 Essential (primary) hypertension: Secondary | ICD-10-CM

## 2016-09-29 DIAGNOSIS — R0602 Shortness of breath: Secondary | ICD-10-CM

## 2016-09-29 DIAGNOSIS — I2510 Atherosclerotic heart disease of native coronary artery without angina pectoris: Secondary | ICD-10-CM | POA: Diagnosis not present

## 2016-09-29 DIAGNOSIS — E784 Other hyperlipidemia: Secondary | ICD-10-CM | POA: Diagnosis not present

## 2016-09-29 DIAGNOSIS — F17200 Nicotine dependence, unspecified, uncomplicated: Secondary | ICD-10-CM

## 2016-09-29 DIAGNOSIS — E7849 Other hyperlipidemia: Secondary | ICD-10-CM

## 2016-09-29 NOTE — Patient Instructions (Addendum)
Testing/Procedures: Your physician has requested that you have an echocardiogram. Echocardiography is a painless test that uses sound waves to create images of your heart. It provides your doctor with information about the size and shape of your heart and how well your heart's chambers and valves are working. This procedure takes approximately one hour. There are no restrictions for this procedure.  Crystal Meyers  Your caregiver has ordered a Stress Test with nuclear imaging. The purpose of this test is to evaluate the blood supply to your heart muscle. This procedure is referred to as a "Non-Invasive Stress Test." This is because other than having an IV started in your vein, nothing is inserted or "invades" your body. Cardiac stress tests are done to find areas of poor blood flow to the heart by determining the extent of coronary artery disease (CAD). Some patients exercise on a treadmill, which naturally increases the blood flow to your heart, while others who are  unable to walk on a treadmill due to physical limitations have a pharmacologic/chemical stress agent called Lexiscan . This medicine will mimic walking on a treadmill by temporarily increasing your coronary blood flow.   Please note: these test may take anywhere between 2-4 hours to complete  PLEASE REPORT TO [place] AT THE FIRST DESK WILL DIRECT YOU WHERE TO GO  Date of Procedure:_Friday October 07, 2016 at 09:00AM__  Arrival Time for Procedure:_Arrive at 08:45AM To register__  Instructions regarding medication:   __X__ : Hold diabetes medication morning of procedure  _X___:  Hold carvedilol the night before procedure and morning of procedure   PLEASE NOTIFY THE OFFICE AT LEAST 24 HOURS IN ADVANCE IF YOU ARE UNABLE TO KEEP YOUR APPOINTMENT.  [phone] AND  PLEASE NOTIFY NUCLEAR MEDICINE AT [facility] AT LEAST 24 HOURS IN ADVANCE IF YOU ARE UNABLE TO KEEP YOUR APPOINTMENT. [phone]  How to  prepare for your Myoview test:  1. Do not eat or drink after midnight 2. No caffeine for 24 hours prior to test 3. No smoking 24 hours prior to test. 4. Your medication may be taken with water.  If your doctor stopped a medication because of this test, do not take that medication. 5. Ladies, please do not wear dresses.  Skirts or pants are appropriate. Please wear a short sleeve shirt. 6. No perfume, cologne or lotion. 7. Wear comfortable walking shoes. No heels!    Follow-Up: Your physician recommends that you schedule a follow-up appointment after testing with Dr. [name].  It was a pleasure seeing you today here in the office. Please do not hesitate to give [place] a call back if you have any further questions. [place], BSN    Echocardiogram An echocardiogram, or echocardiography, uses sound waves (ultrasound) to produce an image of your heart. The echocardiogram is simple, painless, obtained within a short period of time, and offers valuable information to your health care provider. The images from an echocardiogram can provide information such as:  Evidence of coronary artery disease (CAD).  Heart size.  Heart muscle function.  Heart valve function.  Aneurysm detection.  Evidence of a past heart attack.  Fluid buildup around the heart.  Heart muscle thickening.  Assess heart valve function. Tell a health care provider about:  Any allergies you have.  All medicines you are taking, including vitamins, herbs, eye drops, creams, and over-the-counter medicines.  Any problems you or family members have had with anesthetic medicines.  Any blood disorders  you have.  Any surgeries you have had.  Any medical conditions you have.  Whether you are pregnant or may be pregnant. What happens before the procedure? No special preparation is needed. Eat and drink normally. What happens during the procedure?  In order to produce an image of your heart, gel  will be applied to your chest and a wand-like tool (transducer) will be moved over your chest. The gel will help transmit the sound waves from the transducer. The sound waves will harmlessly bounce off your heart to allow the heart images to be captured in real-time motion. These images will then be recorded.  You may need an IV to receive a medicine that improves the quality of the pictures. What happens after the procedure? You may return to your normal schedule including diet, activities, and medicines, unless your health care provider tells you otherwise. This information is not intended to replace advice given to you by your health care provider. Make sure you discuss any questions you have with your health care provider. Document Released: 06/24/2000 Document Revised: 02/13/2016 Document Reviewed: 03/04/2013 Elsevier Interactive Patient Education  2017 [employer]. Pharmacologic Stress Electrocardiogram Introduction A pharmacologic stress electrocardiogram is a heart (cardiac) test that uses nuclear imaging to evaluate the blood supply to your heart. This test may also be called a pharmacologic stress electrocardiography. Pharmacologic means that a medicine is used to increase your heart rate and blood pressure. This stress test is done to find areas of poor blood flow to the heart by determining the extent of coronary artery disease (CAD). Some people exercise on a treadmill, which naturally increases the blood flow to the heart. For those people unable to exercise on a treadmill, a medicine is used. This medicine stimulates your heart and will cause your heart to beat harder and more quickly, as if you were exercising. Pharmacologic stress tests can help determine:  The adequacy of blood flow to your heart during increased levels of activity in order to clear you for discharge home.  The extent of coronary artery blockage caused by CAD.  Your prognosis if you have suffered a heart  attack.  The effectiveness of cardiac procedures done, such as an angioplasty, which can increase the circulation in your coronary arteries.  Causes of chest pain or pressure. LET [facility] CARE PROVIDER KNOW ABOUT:  Any allergies you have.  All medicines you are taking, including vitamins, herbs, eye drops, creams, and over-the-counter medicines.  Previous problems you or members of your family have had with the use of anesthetics.  Any blood disorders you have.  Previous surgeries you have had.  Medical conditions you have.  Possibility of pregnancy, if this applies.  If you are currently breastfeeding. RISKS AND COMPLICATIONS Generally, this is a safe procedure. However, as with any procedure, complications can occur. Possible complications include:  You develop pain or pressure in the following areas:  Chest.  Jaw or neck.  Between your shoulder blades.  Radiating down your left arm.  Headache.  Dizziness or light-headedness.  Shortness of breath.  Increased or irregular heartbeat.  Low blood pressure.  Nausea or vomiting.  Flushing.  Redness going up the arm and slight pain during injection of medicine.  Heart attack (rare). BEFORE THE PROCEDURE  Avoid all forms of caffeine for 24 hours before your test or as directed by your health care provider. This includes coffee, tea (even decaffeinated tea), caffeinated sodas, chocolate, cocoa, and certain pain medicines.  Follow your health care  provider's instructions regarding eating and drinking before the test.  Take your medicines as directed at regular times with water unless instructed otherwise. Exceptions may include:  If you have diabetes, ask how you are to take your insulin or pills. It is common to adjust insulin dosing the morning of the test.  If you are taking beta-blocker medicines, it is important to talk to your health care provider about these medicines well before the date of your test.  Taking beta-blocker medicines may interfere with the test. In some cases, these medicines need to be changed or stopped 24 hours or more before the test.  If you wear a nitroglycerin patch, it may need to be removed prior to the test. Ask your health care provider if the patch should be removed before the test.  If you use an inhaler for any breathing condition, bring it with you to the test.  If you are an outpatient, bring a snack so you can eat right after the stress phase of the test.  Do not smoke for 4 hours prior to the test or as directed by your health care provider.  Do not apply lotions, powders, creams, or oils on your chest prior to the test.  Wear comfortable shoes and clothing. Let your health care provider know if you were unable to complete or follow the preparations for your test. PROCEDURE  Multiple patches (electrodes) will be put on your chest. If needed, small areas of your chest may be shaved to get better contact with the electrodes. Once the electrodes are attached to your body, multiple wires will be attached to the electrodes, and your heart rate will be monitored.  An IV access will be started. A nuclear trace (isotope) is given. The isotope may be given intravenously, or it may be swallowed. Nuclear refers to several types of radioactive isotopes, and the nuclear isotope lights up the arteries so that the nuclear images are clear. The isotope is absorbed by your body. This results in low radiation exposure.  A resting nuclear image is taken to show how your heart functions at rest.  A medicine is given through the IV access.  A second scan is done about 1 hour after the medicine injection and determines how your heart functions under stress.  During this stress phase, you will be connected to an electrocardiogram machine. Your blood pressure and oxygen levels will be monitored. What to expect after the procedure  Your heart rate and blood pressure will be  monitored after the test.  You may return to your normal schedule, including diet,activities, and medicines, unless your health care provider tells you otherwise. This information is not intended to replace advice given to you by your health care provider. Make sure you discuss any questions you have with your health care provider. Document Released: 11/13/2008 Document Revised: 12/03/2015 Document Reviewed: 01/04/2016 Elsevier Interactive Patient Education  2017 [employer].

## 2016-10-07 ENCOUNTER — Encounter
Admission: RE | Admit: 2016-10-07 | Discharge: 2016-10-07 | Disposition: A | Discharge status: Home / Self Care | Payer: Medicare Other | Source: Ambulatory Visit | Attending: Cardiology | Admitting: Cardiology

## 2016-10-07 DIAGNOSIS — R0789 Other chest pain: Secondary | ICD-10-CM | POA: Diagnosis not present

## 2016-10-07 LAB — NM MYOCAR MULTI W/SPECT W/WALL MOTION / EF
CHL CUP MPHR: 152 {beats}/min
CHL CUP NUCLEAR SDS: 0
CHL CUP NUCLEAR SRS: 0
CSEPED: 0 min
CSEPHR: 60 %
Estimated workload: 1 METS
Exercise duration (sec): 0 s
LV sys vol: 20 mL
LVDIAVOL: 57 mL (ref 46–106)
Peak HR: 92 {beats}/min
Rest HR: 76 {beats}/min
SSS: 0
TID: 0.91

## 2016-10-07 MED ORDER — TECHNETIUM TC 99M TETROFOSMIN IV KIT
31.3480 | PACK | Freq: Once | INTRAVENOUS | Status: AC | PRN
  Administered 2016-10-07: 31.348 via INTRAVENOUS

## 2016-10-07 MED ORDER — TECHNETIUM TC 99M TETROFOSMIN IV KIT
13.0000 | PACK | Freq: Once | INTRAVENOUS | Status: AC | PRN
  Administered 2016-10-07: 13.93 via INTRAVENOUS

## 2016-10-07 MED ORDER — REGADENOSON 0.4 MG/5ML IV SOLN
0.4000 mg | Freq: Once | INTRAVENOUS | Status: AC
  Administered 2016-10-07: 0.4 mg via INTRAVENOUS

## 2016-10-26 ENCOUNTER — Other Ambulatory Visit: Payer: Self-pay | Admitting: Internal Medicine

## 2016-11-03 ENCOUNTER — Other Ambulatory Visit: Payer: Self-pay

## 2016-11-03 ENCOUNTER — Ambulatory Visit (INDEPENDENT_AMBULATORY_CARE_PROVIDER_SITE_OTHER): Payer: Medicare Other

## 2016-11-03 DIAGNOSIS — R0789 Other chest pain: Secondary | ICD-10-CM | POA: Diagnosis not present

## 2016-11-10 ENCOUNTER — Encounter: Payer: Self-pay | Admitting: Cardiology

## 2016-11-10 ENCOUNTER — Ambulatory Visit (INDEPENDENT_AMBULATORY_CARE_PROVIDER_SITE_OTHER): Payer: Medicare Other | Admitting: Cardiology

## 2016-11-10 VITALS — BP 122/62 | HR 80 | Ht 66.0 in | Wt 195.5 lb

## 2016-11-10 DIAGNOSIS — E784 Other hyperlipidemia: Secondary | ICD-10-CM

## 2016-11-10 DIAGNOSIS — I10 Essential (primary) hypertension: Secondary | ICD-10-CM

## 2016-11-10 DIAGNOSIS — F17200 Nicotine dependence, unspecified, uncomplicated: Secondary | ICD-10-CM | POA: Diagnosis not present

## 2016-11-10 DIAGNOSIS — I209 Angina pectoris, unspecified: Secondary | ICD-10-CM

## 2016-11-10 DIAGNOSIS — I2510 Atherosclerotic heart disease of native coronary artery without angina pectoris: Secondary | ICD-10-CM

## 2016-11-10 DIAGNOSIS — E7849 Other hyperlipidemia: Secondary | ICD-10-CM

## 2016-11-10 NOTE — Addendum Note (Signed)
Addended by: [name] on: 11/10/2016 02:37 PM   Modules accepted: Level of Service

## 2016-11-10 NOTE — Patient Instructions (Signed)
Follow-Up: Your physician recommends that you schedule a follow-up appointment as needed.   It was a pleasure seeing you today here in the office. Please do not hesitate to give us a call back if you have any further questions. [phone]  Earnie Bechard A. RN, BSN    

## 2016-11-10 NOTE — Progress Notes (Addendum)
Cardiology Office Note   Date:  11/10/2016   ID:  Crystal Meyers, DOB 07-04-1946, MRN [id number]  Referring Doctor:  Crystal Mc, MD   Cardiologist:   Crystal Bushy, MD   Reason for consultation:  Chief Complaint  Patient presents with  . other    follow up after testing. Patient c/o chest pain and thinks it is related to her implants. Meds reviewed verbally with patient.       History of Present Illness: Crystal Meyers is a 71 y.o. female who presents for ffup after tests  Patient reports that since last visit, no significant recurrence of chest pain. She had a couple episodes of sharp twinges that didn't last very long. Not related to exertion.  ROS:  Please see the history of present illness. Aside from mentioned under HPI, all other systems are reviewed and negative.     Past Medical History:  Diagnosis Date  . Barrett's esophagus   . Breast cancer (Crystal Meyers) 7/10   left , invasive lobular carcinoma  . Complication of anesthesia    difficulty waking up and sleep apnea, low sats  . COPD (chronic obstructive pulmonary disease) (Crystal Meyers)   . Coronary artery disease   . Depression   . GERD (gastroesophageal reflux disease)   . Hypercholesteremia   . Hypertension   . Hypothyroidism   . Personal history of tobacco use, presenting hazards to health 05/06/2015  . Personal history of tobacco use, presenting hazards to health 05/06/2015  . Pneumonia    hx of  . PVC (premature ventricular contraction)   . Sleep apnea    wears CPAP nightly  . Trigeminal neuralgia     Past Surgical History:  Procedure Laterality Date  . BREAST SURGERY Bilateral 2011   dbl mastectomy  . COLONOSCOPY W/ POLYPECTOMY    . CRANIECTOMY Right 01/21/2016   Procedure: Microvascular Decompression - right trigemminal nerve;  Surgeon: Crystal Lose, MD;  Location: [facility];  Service: Neurosurgery;  Laterality: Right;  . CRANIOTOMY Right 02/03/2016   Procedure: REPAIR OF CEREBROSPINAL FLUID  LEAK AND HARVEST ABDOMINAL FAT GRAFT;  Surgeon: Crystal Lose, MD;  Location: [facility];  Service: Neurosurgery;  Laterality: Right;  . LUMBAR LAMINECTOMY/DECOMPRESSION MICRODISCECTOMY Left 08/11/2015   Procedure: Left Lumbar four-five microdiscectomy;  Surgeon: Crystal Lose, MD;  Location: [facility];  Service: Neurosurgery;  Laterality: Left;  Crystal Meyers MASTECTOMY  05/2009   bilateral, reconstructive surgery 09/2009  . MASTECTOMY     Bilateral      reports that she has been smoking Cigarettes.  She has a 54.00 pack-year smoking history. She has never used smokeless tobacco. She reports that she does not drink alcohol or use drugs.   family history includes Diabetes in her mother; Heart attack in her father; Hyperlipidemia in her father and mother; Hypertension in her father and mother; Mental illness in her mother; Stroke in her father.   Outpatient Medications Prior to Visit  Medication Sig Dispense Refill  . aspirin EC 81 MG tablet Take 81 mg by mouth daily.    Crystal Meyers atorvastatin (LIPITOR) 40 MG tablet Take 1 tablet (40 mg total) by mouth daily. 90 tablet 3  . carvedilol (COREG) 3.125 MG tablet Take 1 tablet (3.125 mg total) by mouth 2 (two) times daily with a meal. 60 tablet 3  . furosemide (LASIX) 20 MG tablet Take 1 tablet (20 mg total) by mouth daily. 30 tablet 3  . ketoconazole (NIZORAL) 2 % cream Apply 1  application topically daily.    Crystal Meyers levothyroxine (SYNTHROID, LEVOTHROID) 75 MCG tablet Take 1 tablet (75 mcg total) by mouth daily. 90 tablet 3  . lisinopril (PRINIVIL,ZESTRIL) 40 MG tablet TAKE 0.5 TABLET BY MOUTH DAILY 45 tablet 3  . nicotine (NICODERM CQ) 21 mg/24hr patch Place 1 patch (21 mg total) onto the skin daily. 28 patch 0  . nitroGLYCERIN (NITROSTAT) 0.4 MG SL tablet Place 1 tablet (0.4 mg total) under the tongue every 5 (five) minutes as needed for chest pain. 30 tablet 3  . omeprazole (PRILOSEC) 40 MG capsule Take 1 capsule (40 mg total) by mouth daily. 90 capsule 3  .  venlafaxine XR (EFFEXOR-XR) 150 MG 24 hr capsule Take 1 capsule (150 mg total) by mouth daily. 90 capsule 3   No facility-administered medications prior to visit.      Allergies: No known allergies    PHYSICAL EXAM: VS:  BP 122/62 (BP Location: Right Arm, Patient Position: Sitting, Cuff Size: Normal)   Pulse 80   Ht 5\' 6"  (1.676 m)   Wt 195 lb 8 oz (88.7 kg)   SpO2 98%   BMI 31.55 kg/m  , Body mass index is 31.55 kg/m. Wt Readings from Last 3 Encounters:  11/10/16 195 lb 8 oz (88.7 kg)  09/29/16 193 lb 4 oz (87.7 kg)  09/05/16 190 lb (86.2 kg)    GENERAL:  well developed, well nourished, obese, not in acute distress HEENT: normocephalic, pink conjunctivae, anicteric sclerae, no xanthelasma, normal dentition, oropharynx clear NECK:  no neck vein engorgement, JVP normal, no hepatojugular reflux, carotid upstroke brisk and symmetric, no bruit, no thyromegaly, no lymphadenopathy LUNGS:  good respiratory effort, clear to auscultation bilaterally CV:  PMI not displaced, no thrills, no lifts, S1 and S2 within normal limits, no palpable S3 or S4, no murmurs, no rubs, no gallops ABD:  Soft, nontender, nondistended, normoactive bowel sounds, no abdominal aortic bruit, no hepatomegaly, no splenomegaly MS: nontender back, no kyphosis, no scoliosis, no joint deformities EXT:  2+ DP/PT pulses, no edema, no varicosities, no cyanosis, no clubbing SKIN: warm, nondiaphoretic, normal turgor, no ulcers NEUROPSYCH: alert, oriented to person, place, and time, sensory/motor grossly intact, normal mood, appropriate affect    Recent Labs: 04/14/2016: Hemoglobin 13.1; Platelets 232 08/31/2016: ALT 22; BUN 21; Creatinine, Ser 0.97; Potassium 4.4; Sodium 138; TSH 2.71   Lipid Panel    Component Value Date/Time   CHOL 145 08/31/2016 1054   TRIG 114.0 08/31/2016 1054   HDL 50.90 08/31/2016 1054   CHOLHDL 3 08/31/2016 1054   VLDL 22.8 08/31/2016 1054   LDLCALC 72 08/31/2016 1054   LDLDIRECT 71.0  08/31/2016 1054     Other studies Reviewed:  EKG:  The ekg from 09/29/2016 was personally reviewed by me and it revealed sinus rhythm, 74 BPM. Low-voltage QRS. Borderline EKG.  Additional studies/ records that were reviewed personally reviewed by me today include: Echo 11/03/2016: Procedure narrative: Transthoracic echocardiography. Image   quality was poor. The study was technically difficult, as a   result of breast implants and body habitus. - Left ventricle: Systolic function was normal. The estimated   ejection fraction was in the range of 55% to 60%. Left   ventricular diastolic function parameters were normal for the   patient&'s age.  Nuclear stress test 10/07/2016:  There was no ST segment deviation noted during stress.  No T wave inversion was noted during stress.  The study is normal.  This is a low risk study.  The  left ventricular ejection fraction is normal (55-65%).    ASSESSMENT AND PLAN: Coronary atherosclerosis Chest pain Shortness of breath No evidence of ischemia on stress test. LVEF normal. Patient reassured. Likelihood of clinically significant CAD is low.  Continue aspirin 81 mg by mouth daily. Continue with risk factor modification, blood pressure control lipid level control less than 70 as ideal LDL goal.  HTN Continue to monitor blood pressure.  Hyperlipidemia Continue statin therapy. Ideal levels less than 70.  Tobacco use We discussed the importance of smoking cessation and different strategies for quitting.    Current medicines are reviewed at length with the patient today.  The patient does not have concerns regarding medicines.  Labs/ tests ordered today include:  No orders of the defined types were placed in this encounter.  I spent at least 25 minutes with the patient today and more than 50% of the time was spent counseling the patient and coordinating care.     Disposition:   FU with Cardiology prn   Signed, Crystal Bushy,  MD  11/10/2016 2:35 PM    [place]  This note was generated in part with voice recognition software and I apologize for any typographical errors that were not detected and corrected.

## 2016-11-30 ENCOUNTER — Ambulatory Visit: Payer: Medicare Other

## 2016-12-06 ENCOUNTER — Ambulatory Visit (INDEPENDENT_AMBULATORY_CARE_PROVIDER_SITE_OTHER): Payer: Medicare Other | Admitting: Internal Medicine

## 2016-12-06 ENCOUNTER — Encounter: Payer: Self-pay | Admitting: Internal Medicine

## 2016-12-06 ENCOUNTER — Ambulatory Visit (INDEPENDENT_AMBULATORY_CARE_PROVIDER_SITE_OTHER): Payer: Medicare Other

## 2016-12-06 VITALS — BP 112/70 | HR 90 | Temp 98.2°F | Resp 14 | Ht 66.0 in | Wt 194.4 lb

## 2016-12-06 VITALS — BP 112/70 | HR 90 | Temp 98.2°F | Resp 16 | Ht 66.0 in | Wt 194.4 lb

## 2016-12-06 DIAGNOSIS — I2510 Atherosclerotic heart disease of native coronary artery without angina pectoris: Secondary | ICD-10-CM | POA: Diagnosis not present

## 2016-12-06 DIAGNOSIS — G4733 Obstructive sleep apnea (adult) (pediatric): Secondary | ICD-10-CM | POA: Diagnosis not present

## 2016-12-06 DIAGNOSIS — E785 Hyperlipidemia, unspecified: Secondary | ICD-10-CM

## 2016-12-06 DIAGNOSIS — E034 Atrophy of thyroid (acquired): Secondary | ICD-10-CM

## 2016-12-06 DIAGNOSIS — Z0000 Encounter for general adult medical examination without abnormal findings: Secondary | ICD-10-CM

## 2016-12-06 DIAGNOSIS — I209 Angina pectoris, unspecified: Secondary | ICD-10-CM | POA: Diagnosis not present

## 2016-12-06 DIAGNOSIS — E119 Type 2 diabetes mellitus without complications: Secondary | ICD-10-CM

## 2016-12-06 DIAGNOSIS — IMO0002 Reserved for concepts with insufficient information to code with codable children: Secondary | ICD-10-CM | POA: Insufficient documentation

## 2016-12-06 DIAGNOSIS — I2584 Coronary atherosclerosis due to calcified coronary lesion: Secondary | ICD-10-CM

## 2016-12-06 DIAGNOSIS — E1165 Type 2 diabetes mellitus with hyperglycemia: Secondary | ICD-10-CM | POA: Insufficient documentation

## 2016-12-06 DIAGNOSIS — Z720 Tobacco use: Secondary | ICD-10-CM

## 2016-12-06 DIAGNOSIS — R7301 Impaired fasting glucose: Secondary | ICD-10-CM

## 2016-12-06 DIAGNOSIS — R609 Edema, unspecified: Secondary | ICD-10-CM

## 2016-12-06 DIAGNOSIS — E559 Vitamin D deficiency, unspecified: Secondary | ICD-10-CM

## 2016-12-06 DIAGNOSIS — R600 Localized edema: Secondary | ICD-10-CM | POA: Diagnosis not present

## 2016-12-06 LAB — LIPID PANEL
CHOLESTEROL: 142 mg/dL (ref 0–200)
HDL: 40.7 mg/dL (ref >39.00)
LDL Cholesterol: 67 mg/dL (ref 0–99)
NONHDL: 101.59
Total CHOL/HDL Ratio: 3
Triglycerides: 172 mg/dL — ABNORMAL HIGH (ref 0.0–149.0)
VLDL: 34.4 mg/dL (ref 0.0–40.0)

## 2016-12-06 LAB — COMPREHENSIVE METABOLIC PANEL
ALBUMIN: 4.3 g/dL (ref 3.5–5.2)
ALT: 23 U/L (ref 0–35)
AST: 20 U/L (ref 0–37)
Alkaline Phosphatase: 39 U/L (ref 39–117)
BILIRUBIN TOTAL: 0.4 mg/dL (ref 0.2–1.2)
BUN: 21 mg/dL (ref 6–23)
CALCIUM: 9.8 mg/dL (ref 8.4–10.5)
CO2: 29 mEq/L (ref 19–32)
CREATININE: 1.12 mg/dL (ref 0.40–1.20)
Chloride: 104 mEq/L (ref 96–112)
GFR: 51.34 mL/min — ABNORMAL LOW (ref >60.00)
Glucose, Bld: 137 mg/dL — ABNORMAL HIGH (ref 70–99)
Potassium: 3.7 mEq/L (ref 3.5–5.1)
SODIUM: 139 meq/L (ref 135–145)
TOTAL PROTEIN: 7 g/dL (ref 6.0–8.3)

## 2016-12-06 LAB — HEMOGLOBIN A1C: HEMOGLOBIN A1C: 6.6 % — AB (ref 4.6–6.5)

## 2016-12-06 LAB — VITAMIN D 25 HYDROXY (VIT D DEFICIENCY, FRACTURES): VITD: 36.98 ng/mL (ref 30.00–100.00)

## 2016-12-06 NOTE — Assessment & Plan Note (Signed)
Her  random glucose is again elevated but not diagnostic of diabetes. Howver her A1c is now 6.6 .  I recommend she follow a low glycemic index diet and particpate regularly in an aerobic  exercise activity.  We should check an A1c in 3 months.   Lab Results  Component Value Date   HGBA1C 6.6 (H) 12/06/2016    

## 2016-12-06 NOTE — Patient Instructions (Signed)
You  Have venous insufficiency which is causing your legs to swell and become "rusty colored."  You  should elevate your legs when at home sitting whenever it is possible.   Referral to [facility] for evaluation is in provess   [email or web address]  Has a great selection of affordable compression stockings and sock/knee highs  YOU HAVE CORONARY ARTERY AND AORTIC ATHEROSCLEROSIS.  PLEASE WORK TO REDUCE YOUR CIGARETTE USE  Do not resume carvedilol ,  Do not increase your dose of lasix

## 2016-12-06 NOTE — Progress Notes (Signed)
Subjective:   Crystal Meyers is a 71 y.o. female who presents for Medicare Annual (Subsequent) preventive examination.  Review of Systems:  No ROS.  Medicare Wellness Visit.  Cardiac Risk Factors include: advanced age (>83men, >59 women);hypertension     Objective:     Vitals: BP 112/70 (BP Location: Left Arm, Patient Position: Sitting, Cuff Size: Normal)   Pulse 90   Temp 98.2 F (36.8 C) (Oral)   Resp 14   Ht 5\' 6"  (1.676 m)   Wt 194 lb 6.4 oz (88.2 kg)   SpO2 96%   BMI 31.38 kg/m   Body mass index is 31.38 kg/m.   Tobacco History  Smoking Status  . Current Every Day Smoker  . Packs/day: 1.00  . Years: 54.00  . Types: Cigarettes  Smokeless Tobacco  . Never Used     Ready to quit: Not Answered Counseling given: Not Answered   Past Medical History:  Diagnosis Date  . Barrett's esophagus   . Breast cancer ([place]) 7/10   left , invasive lobular carcinoma  . Complication of anesthesia    difficulty waking up and sleep apnea, low sats  . COPD (chronic obstructive pulmonary disease) ([place])   . Coronary artery disease   . Depression   . GERD (gastroesophageal reflux disease)   . Hypercholesteremia   . Hypertension   . Hypothyroidism   . Personal history of tobacco use, presenting hazards to health 05/06/2015  . Personal history of tobacco use, presenting hazards to health 05/06/2015  . Pneumonia    hx of  . PVC (premature ventricular contraction)   . Sleep apnea    wears CPAP nightly  . Trigeminal neuralgia    Past Surgical History:  Procedure Laterality Date  . BREAST SURGERY Bilateral 2011   dbl mastectomy  . COLONOSCOPY W/ POLYPECTOMY    . CRANIECTOMY Right 01/21/2016   Procedure: Microvascular Decompression - right trigemminal nerve;  Surgeon: [name], MD;  Location: [facility];  Service: Neurosurgery;  Laterality: Right;  . CRANIOTOMY Right 02/03/2016   Procedure: REPAIR OF CEREBROSPINAL FLUID LEAK AND HARVEST ABDOMINAL FAT GRAFT;   Surgeon: [name], MD;  Location: [facility];  Service: Neurosurgery;  Laterality: Right;  . LUMBAR LAMINECTOMY/DECOMPRESSION MICRODISCECTOMY Left 08/11/2015   Procedure: Left Lumbar four-five microdiscectomy;  Surgeon: [name], MD;  Location: [facility];  Service: Neurosurgery;  Laterality: Left;  [name] MASTECTOMY  05/2009   bilateral, reconstructive surgery 09/2009  . MASTECTOMY     Bilateral    Family History  Problem Relation Age of Onset  . Mental illness Mother   . Diabetes Mother   . Hypertension Mother   . Hyperlipidemia Mother   . Hypertension Father   . Hyperlipidemia Father   . Stroke Father   . Heart attack Father   . Cancer Brother        Lung  . Diabetes Brother   . Diabetes Brother    History  Sexual Activity  . Sexual activity: No    Outpatient Encounter Prescriptions as of 12/06/2016  Medication Sig  . aspirin EC 81 MG tablet Take 81 mg by mouth daily.  [name] atorvastatin (LIPITOR) 40 MG tablet Take 1 tablet (40 mg total) by mouth daily.  . carvedilol (COREG) 3.125 MG tablet Take 1 tablet (3.125 mg total) by mouth 2 (two) times daily with a meal.  . furosemide (LASIX) 20 MG tablet Take 1 tablet (20 mg total) by mouth daily.  [name] ketoconazole (NIZORAL)  2 % cream Apply 1 application topically daily.  [name] levothyroxine (SYNTHROID, LEVOTHROID) 75 MCG tablet Take 1 tablet (75 mcg total) by mouth daily.  [name] lisinopril (PRINIVIL,ZESTRIL) 40 MG tablet TAKE 0.5 TABLET BY MOUTH DAILY  . nitroGLYCERIN (NITROSTAT) 0.4 MG SL tablet Place 1 tablet (0.4 mg total) under the tongue every 5 (five) minutes as needed for chest pain.  [name] omeprazole (PRILOSEC) 40 MG capsule Take 1 capsule (40 mg total) by mouth daily.  [name] venlafaxine XR (EFFEXOR-XR) 150 MG 24 hr capsule Take 1 capsule (150 mg total) by mouth daily.  . [DISCONTINUED] nicotine (NICODERM CQ) 21 mg/24hr patch Place 1 patch (21 mg total) onto the skin daily.   No facility-administered encounter medications on  file as of 12/06/2016.     Activities of Daily Living In your present state of health, do you have any difficulty performing the following activities: 12/06/2016 01/21/2016  Hearing? N N  Vision? N N  Difficulty concentrating or making decisions? Y N  Walking or climbing stairs? Y N  Dressing or bathing? N N  Doing errands, shopping? N N  Preparing Food and eating ? N -  Using the Toilet? N -  In the past six months, have you accidently leaked urine? N -  Do you have problems with loss of bowel control? N -  Managing your Medications? N -  Managing your Finances? N -  Housekeeping or managing your Housekeeping? N -  Some recent data might be hidden    Patient Care Team: [name], MD as PCP - General (Internal Medicine) [name], MD (Unknown Physician Specialty)    Assessment:    This is a routine wellness examination for Crystal Meyers. The goal of the wellness visit is to assist the patient how to close the gaps in care and create a preventative care plan for the patient.   Osteoporosis risk reviewed.  Medications reviewed; taking without issues or barriers.  Safety issues reviewed; smoke detectors in the home. No firearms in the home.  Wears seatbelts when driving or riding with others. Patient does wear sunscreen or protective clothing when in direct sunlight. No violence in the home.  Patient is alert, normal appearance, oriented to person/place/and time. Correctly identified the president of the [place], recall of 3/3 words, and performing simple calculations.  Patient displays appropriate judgement and can read correct time from watch face.  No new identified risk were noted.  No failures at ADL's or IADL's.   BMI- discussed the importance of a healthy diet, water intake and exercise. Educational material provided.   Daily fluid intake: decaffeinated coffee  HTN- followed by PCP.  Dental- every six months.  Dr. Leanor Kail.  Eye- Visual acuity not assessed  per patient preference since they have regular follow up with the ophthalmologist.  Wears corrective lenses.  Sleep patterns- Sleeps  5 hours at night.  Wakes feeling rested. CPAP in use.  Naps as needed.  Mammogram discussed.  Educational material provided.  TDAP vaccine deferred per patient preference.  Follow up with insurance.  Educational material provided.  Patient Concerns: None at this time. Follow up with PCP as needed.  Exercise Activities and Dietary recommendations Current Exercise Habits: The patient does not participate in regular exercise at present  Goals    . Increase physical activity          Stay active and walk for exercise    . Increase water intake      Fall Risk Fall Risk  12/06/2016 09/05/2016 02/09/2015 11/25/2013  Falls in the past year? No No No No   Depression Screen PHQ 2/9 Scores 12/06/2016 09/05/2016 02/09/2015 11/25/2013  PHQ - 2 Score 0 2 0 0     Cognitive Function     6CIT Screen 12/06/2016  What Year? 0 points  What month? 0 points  What time? 0 points  Count back from 20 0 points  Months in reverse 0 points  Repeat phrase 0 points  Total Score 0    Immunization History  Administered Date(s) Administered  . Influenza,inj,Quad PF,36+ Mos 09/05/2016  . Pneumococcal Conjugate-13 11/25/2013  . Pneumococcal Polysaccharide-23 11/26/2006, 07/03/2015   Screening Tests Health Maintenance  Topic Date Due  . TETANUS/TDAP  06/08/1967  . MAMMOGRAM  10/26/2014  . INFLUENZA VACCINE  02/08/2017  . COLONOSCOPY  09/10/2024  . DEXA SCAN  Completed  . Hepatitis C Screening  Completed  . PNA vac Low Risk Adult  Completed      Plan:    End of life planning; Advance aging; Advanced directives discussed. Copy of current HCPOA/Living Will on file.    I have personally reviewed and noted the following in the patient's chart:   . Medical and social history . Use of alcohol, tobacco or illicit drugs  . Current medications and  supplements . Functional ability and status . Nutritional status . Physical activity . Advanced directives . List of other physicians . Hospitalizations, surgeries, and ER visits in previous 12 months . Vitals . Screenings to include cognitive, depression, and falls . Referrals and appointments  In addition, I have reviewed and discussed with patient certain preventive protocols, quality metrics, and best practice recommendations. A written personalized care plan for preventive services as well as general preventive health recommendations were provided to patient.     OBrien-Blaney, Cadyn Rodger L, LPN  4/82/5003    I have reviewed the above information and agree with above.   Deborra Medina, MD

## 2016-12-06 NOTE — Assessment & Plan Note (Signed)
Urged to quit.  Smoking.  Prior intolerance to chantix and wellbutrin.  

## 2016-12-06 NOTE — Assessment & Plan Note (Addendum)
Noninvasive workup was negative for ischemic changes. She has CAD and aortic atherosclerosis by prior imaging studies.  She is no longer taking carvedilol due to low blood pressure.  Taking statin and ASAm  but smoking 1 pack of cigarettes daily.  patient is a retired Warden/ranger.

## 2016-12-06 NOTE — Assessment & Plan Note (Signed)
Diagnosed by sleep study. She is wearing her CPAP every night a minimum of 6 hours per night and notes improved daytime wakefulness and decreased fatigue  

## 2016-12-06 NOTE — Assessment & Plan Note (Signed)
Thyroid function is WNL on current dose.  No current changes needed.   Lab Results  Component Value Date   TSH 2.71 08/31/2016

## 2016-12-06 NOTE — Assessment & Plan Note (Signed)
Her thyroid function is normal .  Advised her that her bilateral ptosis can be surgically corrected but would likely required tobacco cessation

## 2016-12-06 NOTE — Patient Instructions (Addendum)
  [name] , Thank you for taking time to come for your Medicare Wellness Visit. I appreciate your ongoing commitment to your health goals. Please review the following plan we discussed and let me know if I can assist you in the future.   Follow up with Dr. Derrel Nip as needed.    WRITE ALL APPOINTMENTS DOWN ON CALENDAR.  Have a great day!  These are the goals we discussed: Goals    . Increase physical activity          Stay active and walk for exercise    . Increase water intake       This is a list of the screening recommended for you and due dates:  Health Maintenance  Topic Date Due  . Tetanus Vaccine  06/08/1967  . Mammogram  10/26/2014  . Flu Shot  02/08/2017  . Colon Cancer Screening  09/10/2024  . DEXA scan (bone density measurement)  Completed  .  Hepatitis C: One time screening is recommended by Center for Disease Control  (CDC) for  adults born from [birth year] through 1963.   Completed  . Pneumonia vaccines  Completed

## 2016-12-06 NOTE — Progress Notes (Signed)
Subjective:  Patient ID: Crystal Meyers, female    DOB: 1945-11-29  Age: 71 y.o. MRN: [id number]  CC: The primary encounter diagnosis was Edema, unspecified type. Diagnoses of Vitamin D deficiency, Hyperlipidemia LDL goal <100, Hypothyroidism due to acquired atrophy of thyroid, Impaired fasting glucose, Facial edema, Coronary artery disease due to calcified coronary lesion, Current tobacco use, Sleep apnea, obstructive, and Diabetes mellitus without complication ([place]) were also pertinent to this visit.  HPI Crystal Meyers presents for follow upon multiple issues including  impaired fasting flocose,  Hyperlipidemia and CAD.  She was referred to cardiolgy for evaluation of recurrent chest pain in the setting of aortic and coronary atvoiding herosclerosis  noted on CT scan of lungs in November.   Noninvasive work up was negative for ischemia.or clinically significant CAD    patient continues to smoke despite a history of  breast cancer.  Because she feels reassured by her normal cardiac workup.  ,  SMOKING CESSATION disucssed.  She admits that she is "not even trying." Averaging  ONE PACK DAILY .  She did not tolerate prior trials of Chantix or WELLBUTRIN,  DISCUSSED GRADUAL REDUCTION   PERSISTENT EDEMA. Left > right despite using prn doses of lasix.  She reports no  Increase in  urination,  Only  Voiding 2 times daily .  Puffy eyelids.  She has bilateral ptosis interfering with vision , as well as puffiness under eyes  .  Was referred to [facility] for surgery by dermatology but did not go .    Adverse drug effects.  She was prescribed lamisil concurrent with atorvastatin for 3 weeks. For management of left toe onychomycosis by Brendolyn Patty. . She developed a diffused rash on her arms and torso during Week 3 so she  stopped the lamisil  About ten days ago   The rash has resolved.  .     Lab Results  Component Value Date   TSH 2.71 08/31/2016   Lab Results  Component Value Date   HGBA1C 6.6 (H)  12/06/2016     Outpatient Medications Prior to Visit  Medication Sig Dispense Refill  . aspirin EC 81 MG tablet Take 81 mg by mouth daily.    [name] atorvastatin (LIPITOR) 40 MG tablet Take 1 tablet (40 mg total) by mouth daily. 90 tablet 3  . carvedilol (COREG) 3.125 MG tablet Take 1 tablet (3.125 mg total) by mouth 2 (two) times daily with a meal. 60 tablet 3  . furosemide (LASIX) 20 MG tablet Take 1 tablet (20 mg total) by mouth daily. 30 tablet 3  . ketoconazole (NIZORAL) 2 % cream Apply 1 application topically daily.    [name] levothyroxine (SYNTHROID, LEVOTHROID) 75 MCG tablet Take 1 tablet (75 mcg total) by mouth daily. 90 tablet 3  . lisinopril (PRINIVIL,ZESTRIL) 40 MG tablet TAKE 0.5 TABLET BY MOUTH DAILY 45 tablet 3  . nitroGLYCERIN (NITROSTAT) 0.4 MG SL tablet Place 1 tablet (0.4 mg total) under the tongue every 5 (five) minutes as needed for chest pain. 30 tablet 3  . omeprazole (PRILOSEC) 40 MG capsule Take 1 capsule (40 mg total) by mouth daily. 90 capsule 3  . venlafaxine XR (EFFEXOR-XR) 150 MG 24 hr capsule Take 1 capsule (150 mg total) by mouth daily. 90 capsule 3   No facility-administered medications prior to visit.     Review of Systems;  Patient denies headache, fevers, malaise, unintentional weight loss, skin rash, eye pain, sinus congestion and sinus pain, sore throat, dysphagia,  hemoptysis , cough, dyspnea, wheezing, chest pain, palpitations, orthopnea, edema, abdominal pain, nausea, melena, diarrhea, constipation, flank pain, dysuria, hematuria, urinary  Frequency, nocturia, numbness, tingling, seizures,  Focal weakness, Loss of consciousness,  Tremor, insomnia, depression, anxiety, and suicidal ideation.      Objective:  BP 112/70 (BP Location: Right Arm, Patient Position: Sitting, Cuff Size: Normal)   Pulse 90   Temp 98.2 F (36.8 C) (Oral)   Resp 16   Ht 5\' 6"  (1.676 m)   Wt 194 lb 6.4 oz (88.2 kg)   SpO2 96%   BMI 31.38 kg/m   BP Readings from Last 3  Encounters:  12/06/16 112/70  12/06/16 112/70  11/10/16 122/62    Wt Readings from Last 3 Encounters:  12/06/16 194 lb 6.4 oz (88.2 kg)  12/06/16 194 lb 6.4 oz (88.2 kg)  11/10/16 195 lb 8 oz (88.7 kg)    General appearance: alert, cooperative and appears stated age Ears: normal TM's and external ear canals both ears Throat: lips, mucosa, and tongue normal; teeth and gums normal Neck: no adenopathy, no carotid bruit, supple, symmetrical, trachea midline and thyroid not enlarged, symmetric, no tenderness/mass/nodules Back: symmetric, no curvature. ROM normal. No CVA tenderness. Lungs: clear to auscultation bilaterally Heart: regular rate and rhythm, S1, S2 normal, no murmur, click, rub or gallop Abdomen: soft, non-tender; bowel sounds normal; no masses,  no organomegaly Pulses: 2+ and symmetric Skin: Skin color, texture, turgor normal. No rashes or lesions Lymph nodes: Cervical, supraclavicular, and axillary nodes normal.  Lab Results  Component Value Date   HGBA1C 6.6 (H) 12/06/2016   HGBA1C 6.3 08/31/2016   HGBA1C 5.9 02/09/2015    Lab Results  Component Value Date   CREATININE 1.12 12/06/2016   CREATININE 0.97 08/31/2016   CREATININE 1.09 (H) 04/14/2016    Lab Results  Component Value Date   WBC 6.5 04/14/2016   HGB 13.1 04/14/2016   HCT 37.2 04/14/2016   PLT 232 04/14/2016   GLUCOSE 137 (H) 12/06/2016   CHOL 142 12/06/2016   TRIG 172.0 (H) 12/06/2016   HDL 40.70 12/06/2016   LDLDIRECT 71.0 08/31/2016   LDLCALC 67 12/06/2016   ALT 23 12/06/2016   AST 20 12/06/2016   NA 139 12/06/2016   K 3.7 12/06/2016   CL 104 12/06/2016   CREATININE 1.12 12/06/2016   BUN 21 12/06/2016   CO2 29 12/06/2016   TSH 2.71 08/31/2016   HGBA1C 6.6 (H) 12/06/2016   MICROALBUR <0.7 08/31/2016    Nm Myocar Multi W/spect W/wall Motion / Ef  Result Date: 10/07/2016  There was no ST segment deviation noted during stress.  No T wave inversion was noted during stress.  The study  is normal.  This is a low risk study.  The left ventricular ejection fraction is normal (55-65%).     Assessment & Plan:   Problem List Items Addressed This Visit    Sleep apnea, obstructive    Diagnosed by sleep study. She is wearing her CPAP every night a minimum of 6 hours per night and notes improved daytime wakefulness and decreased fatigue       Hypothyroidism    Thyroid function is WNL on current dose.  No current changes needed.   Lab Results  Component Value Date   TSH 2.71 08/31/2016         Hyperlipidemia LDL goal <100   Relevant Orders   Lipid panel (Completed)   Facial edema    Her thyroid function is normal .  Advised her that her bilateral ptosis can be surgically corrected but would likely required tobacco cessation        Edema - Primary    Chronic, asymmetric, left greater than right,  early venous stasis changes.  Recommended use of compression stockings. . Referral to vascular surgery for venous mapping      Relevant Orders   Ambulatory referral to Vascular Surgery   Comprehensive metabolic panel (Completed)   Diabetes mellitus without complication ([place])    Her  random glucose is again elevated but not diagnostic of diabetes. Howver her A1c is now 6.6 .  I recommend she follow a low glycemic index diet and particpate regularly in an aerobic  exercise activity.  We should check an A1c in 3 months.   Lab Results  Component Value Date   HGBA1C 6.6 (H) 12/06/2016         Current tobacco use    Urged to quit.  Smoking.  Prior intolerance to chantix and wellbutrin      Coronary artery disease due to calcified coronary lesion    Noninvasive workup was negative for ischemic changes. She has CAD and aortic atherosclerosis by prior imaging studies.  She is no longer taking carvedilol due to low blood pressure.  Taking statin and ASAm  but smoking 1 pack of cigarettes daily.  patient is a retired Warden/ranger.        Other Visit Diagnoses    Vitamin D  deficiency       Relevant Orders   VITAMIN D 25 Hydroxy (Vit-D Deficiency, Fractures) (Completed)     A total of 40 minutes was spent with patient more than half of which was spent in counseling patient on the above mentioned issues , reviewing and explaining recent labs and imaging studies done, and coordination of care.  I am having Ms. Shughart maintain her aspirin EC, levothyroxine, lisinopril, omeprazole, venlafaxine XR, atorvastatin, nitroGLYCERIN, furosemide, carvedilol, and ketoconazole.  No orders of the defined types were placed in this encounter.   There are no discontinued medications.  Follow-up: Return in about 3 months (around 03/08/2017).   [name], MD

## 2016-12-06 NOTE — Assessment & Plan Note (Signed)
Chronic, asymmetric, left greater than right,  early venous stasis changes.  Recommended use of compression stockings. . Referral to vascular surgery for venous mapping

## 2016-12-08 ENCOUNTER — Encounter: Payer: Self-pay | Admitting: Internal Medicine

## 2016-12-12 DIAGNOSIS — G500 Trigeminal neuralgia: Secondary | ICD-10-CM | POA: Diagnosis not present

## 2016-12-12 DIAGNOSIS — I10 Essential (primary) hypertension: Secondary | ICD-10-CM | POA: Diagnosis not present

## 2016-12-12 DIAGNOSIS — Z6830 Body mass index (BMI) 30.0-30.9, adult: Secondary | ICD-10-CM | POA: Diagnosis not present

## 2016-12-29 ENCOUNTER — Ambulatory Visit (INDEPENDENT_AMBULATORY_CARE_PROVIDER_SITE_OTHER): Payer: Medicare Other | Admitting: Vascular Surgery

## 2016-12-29 ENCOUNTER — Encounter (INDEPENDENT_AMBULATORY_CARE_PROVIDER_SITE_OTHER): Payer: Self-pay | Admitting: Vascular Surgery

## 2016-12-29 VITALS — BP 115/70 | HR 86 | Resp 17 | Ht 66.5 in | Wt 191.6 lb

## 2016-12-29 DIAGNOSIS — I2584 Coronary atherosclerosis due to calcified coronary lesion: Secondary | ICD-10-CM | POA: Diagnosis not present

## 2016-12-29 DIAGNOSIS — I872 Venous insufficiency (chronic) (peripheral): Secondary | ICD-10-CM | POA: Diagnosis not present

## 2016-12-29 DIAGNOSIS — I890 Lymphedema, not elsewhere classified: Secondary | ICD-10-CM | POA: Diagnosis not present

## 2016-12-29 DIAGNOSIS — E785 Hyperlipidemia, unspecified: Secondary | ICD-10-CM | POA: Diagnosis not present

## 2016-12-29 DIAGNOSIS — I2510 Atherosclerotic heart disease of native coronary artery without angina pectoris: Secondary | ICD-10-CM | POA: Diagnosis not present

## 2016-12-29 DIAGNOSIS — E119 Type 2 diabetes mellitus without complications: Secondary | ICD-10-CM | POA: Diagnosis not present

## 2016-12-29 DIAGNOSIS — M7989 Other specified soft tissue disorders: Secondary | ICD-10-CM | POA: Diagnosis not present

## 2016-12-29 NOTE — Progress Notes (Signed)
MRN : [id number]  Crystal Meyers is a 71 y.o. (1945-09-17) female who presents with chief complaint of No chief complaint on file. [name]  History of Present Illness: Patient is seen for evaluation of leg swelling, left leg much more than right. The patient first noticed the swelling remotely but is now concerned because of a significant increase in the overall edema. The swelling is associated with pain and discoloration. The patient notes that in the morning the legs are significantly improved but they steadily worsened throughout the course of the day. Elevation makes the legs better, dependency makes them much worse.   There is no history of ulcerations associated with the swelling.   The patient denies any recent changes in their medications.  The patient has not been wearing graduated compression.  The patient has no had any past angiography, interventions or vascular surgery.  The patient denies a history of DVT or PE. There is no prior history of phlebitis. There is no history of primary lymphedema.  There is no history of radiation treatment to the groin or pelvis No history of malignancies. No history of trauma or groin or pelvic surgery. No history of foreign travel or parasitic infections area    No outpatient prescriptions have been marked as taking for the 12/29/16 encounter (Appointment) with Delana Meyer, Dolores Lory, MD.    Past Medical History:  Diagnosis Date  . Barrett's esophagus   . Breast cancer ([place]) 7/10   left , invasive lobular carcinoma  . Complication of anesthesia    difficulty waking up and sleep apnea, low sats  . COPD (chronic obstructive pulmonary disease) ([place])   . Coronary artery disease   . Depression   . GERD (gastroesophageal reflux disease)   . Hypercholesteremia   . Hypertension   . Hypothyroidism   . Personal history of tobacco use, presenting hazards to health 05/06/2015  . Personal history of tobacco use, presenting hazards to health  05/06/2015  . Pneumonia    hx of  . PVC (premature ventricular contraction)   . Sleep apnea    wears CPAP nightly  . Trigeminal neuralgia     Past Surgical History:  Procedure Laterality Date  . BREAST SURGERY Bilateral 2011   dbl mastectomy  . COLONOSCOPY W/ POLYPECTOMY    . CRANIECTOMY Right 01/21/2016   Procedure: Microvascular Decompression - right trigemminal nerve;  Surgeon: [name], MD;  Location: [facility];  Service: Neurosurgery;  Laterality: Right;  . CRANIOTOMY Right 02/03/2016   Procedure: REPAIR OF CEREBROSPINAL FLUID LEAK AND HARVEST ABDOMINAL FAT GRAFT;  Surgeon: [name], MD;  Location: [facility];  Service: Neurosurgery;  Laterality: Right;  . LUMBAR LAMINECTOMY/DECOMPRESSION MICRODISCECTOMY Left 08/11/2015   Procedure: Left Lumbar four-five microdiscectomy;  Surgeon: [name], MD;  Location: [place] [facility];  Service: Neurosurgery;  Laterality: Left;  [name] MASTECTOMY  05/2009   bilateral, reconstructive surgery 09/2009  . MASTECTOMY     Bilateral     Social History Social History  Substance Use Topics  . Smoking status: Current Every Day Smoker    Packs/day: 1.00    Years: 54.00    Types: Cigarettes  . Smokeless tobacco: Never Used     Comment: Refused cessation material  . Alcohol use No    Family History Family History  Problem Relation Age of Onset  . Mental illness Mother   . Diabetes Mother   . Hypertension Mother   . Hyperlipidemia Mother   . Hypertension Father   .  Hyperlipidemia Father   . Stroke Father   . Heart attack Father   . Cancer Brother        Lung  . Diabetes Brother   . Diabetes Brother   No family history of bleeding/clotting disorders, porphyria or autoimmune disease   Allergies  Allergen Reactions  . Lamisil [Terbinafine Hcl] Rash  . No Known Allergies Rash     REVIEW OF SYSTEMS (Negative unless checked)  Constitutional: [] Weight loss  [] Fever  [] Chills Cardiac: [] Chest pain   [] Chest  pressure   [] Palpitations   [] Shortness of breath when laying flat   [] Shortness of breath with exertion. Vascular:  [] Pain in legs with walking   [] Pain in legs at rest  [] History of DVT   [] Phlebitis   [x] Swelling in legs   [] Varicose veins   [] Non-healing ulcers Pulmonary:   [] Uses home oxygen   [] Productive cough   [] Hemoptysis   [] Wheeze  [] COPD   [] Asthma Neurologic:  [] Dizziness   [] Seizures   [] History of stroke   [] History of TIA  [] Aphasia   [] Vissual changes   [] Weakness or numbness in arm   [] Weakness or numbness in leg Musculoskeletal:   [] Joint swelling   [x] Joint pain   [] Low back pain Hematologic:  [] Easy bruising  [] Easy bleeding   [] Hypercoagulable state   [] Anemic Gastrointestinal:  [] Diarrhea   [] Vomiting  [] Gastroesophageal reflux/heartburn   [] Difficulty swallowing. Genitourinary:  [] Chronic kidney disease   [] Difficult urination  [] Frequent urination   [] Blood in urine Skin:  [] Rashes   [] Ulcers  Psychological:  [] History of anxiety   []  History of major depression.  Physical Examination  There were no vitals filed for this visit. There is no height or weight on file to calculate BMI. Gen: WD/WN, NAD Head: [place]/AT, No temporalis wasting.  Ear/Nose/Throat: Hearing grossly intact, nares w/o erythema or drainage, poor dentition Eyes: PER, EOMI, sclera nonicteric.  Neck: Supple, no masses.  No bruit or JVD.  Pulmonary:  Good air movement, clear to auscultation bilaterally, no use of accessory muscles.  Cardiac: RRR, normal S1, S2, no Murmurs. Vascular: scattered small varicosities present bilaterally.  Mild venous stasis changes to the legs bilaterally.  2+ soft pitting edema left leg trace edema right leg Vessel Right Left  Radial Palpable Palpable  PT Palpable Palpable  DP Palpable Palpable  Gastrointestinal: soft, non-distended. No guarding/no peritoneal signs.  Musculoskeletal: M/S 5/5 throughout.  No deformity or atrophy.  Neurologic: CN 2-12 intact. Pain and light  touch intact in extremities.  Symmetrical.  Speech is fluent. Motor exam as listed above. Psychiatric: Judgment intact, Mood & affect appropriate for pt's clinical situation. Dermatologic: No rashes or ulcers noted.  No changes consistent with cellulitis. Lymph : No Cervical lymphadenopathy, no lichenification or skin changes of chronic lymphedema.  CBC Lab Results  Component Value Date   WBC 6.5 04/14/2016   HGB 13.1 04/14/2016   HCT 37.2 04/14/2016   MCV 88.1 04/14/2016   PLT 232 04/14/2016    BMET    Component Value Date/Time   NA 139 12/06/2016 1411   NA 136 (A) 09/23/2014   NA 140 03/26/2014 1448   K 3.7 12/06/2016 1411   K 4.0 03/26/2014 1448   CL 104 12/06/2016 1411   CL 104 03/26/2014 1448   CO2 29 12/06/2016 1411   CO2 30 03/26/2014 1448   GLUCOSE 137 (H) 12/06/2016 1411   GLUCOSE 91 03/26/2014 1448   BUN 21 12/06/2016 1411   BUN 14 09/23/2014  BUN 19 (H) 03/26/2014 1448   CREATININE 1.12 12/06/2016 1411   CREATININE 1.12 03/26/2014 1448   CALCIUM 9.8 12/06/2016 1411   CALCIUM 9.5 03/26/2014 1448   GFRNONAA 51 (L) 04/14/2016 1510   GFRNONAA 52 (L) 03/26/2014 1448   GFRAA 59 (L) 04/14/2016 1510   GFRAA 60 (L) 03/26/2014 1448   CrCl cannot be calculated (Patient's most recent lab result is older than the maximum 21 days allowed.).  COAG No results found for: INR, PROTIME  Radiology No results found.  Assessment/Plan 1. Chronic venous insufficiency No surgery or intervention at this point in time.    I have had a long discussion with the patient regarding venous insufficiency and why it  causes symptoms. I have discussed with the patient the chronic skin changes that accompany venous insufficiency and the long term sequela such as infection and ulceration.  Patient will begin wearing graduated compression stockings class 1 (20-30 mmHg) or compression wraps on a daily basis a prescription was given. The patient will put the stockings on first thing in the  morning and removing them in the evening. The patient is instructed specifically not to sleep in the stockings.    In addition, behavioral modification including several periods of elevation of the lower extremities during the day will be continued. I have demonstrated that proper elevation is a position with the ankles at heart level.  The patient is instructed to begin routine exercise, especially walking on a daily basis  Following the review of the ultrasound the patient will follow up PRN  2. Lymphedema I have had a long discussion with the patient regarding swelling and why it  causes symptoms.  Patient will begin wearing graduated compression stockings class 1 (20-30 mmHg) on a daily basis a prescription was given. The patient will  beginning wearing the stockings first thing in the morning and removing them in the evening. The patient is instructed specifically not to sleep in the stockings.   In addition, behavioral modification will be initiated.  This will include frequent elevation, use of over the counter pain medications and exercise such as walking.  I have reviewed systemic causes for chronic edema such as liver, kidney and cardiac etiologies.  The patient denies problems with these organ systems.    3. Leg swelling See #1&2  4. Coronary artery disease due to calcified coronary lesion Continue cardiac and antihypertensive medications as already ordered and reviewed, no changes at this time.  Continue statin as ordered and reviewed, no changes at this time  Nitrates PRN for chest pain   5. Diabetes mellitus without complication ([place]) Continue hypoglycemic medications as already ordered, these medications have been reviewed and there are no changes at this time.  Hgb A1C to be monitored as already arranged by primary service   6. Hyperlipidemia LDL goal <100 Continue statin as ordered and reviewed, no changes at this time     Hortencia Pilar, MD  12/29/2016 12:06  PM

## 2017-03-08 ENCOUNTER — Ambulatory Visit (INDEPENDENT_AMBULATORY_CARE_PROVIDER_SITE_OTHER): Payer: Medicare Other

## 2017-03-08 ENCOUNTER — Ambulatory Visit (INDEPENDENT_AMBULATORY_CARE_PROVIDER_SITE_OTHER): Payer: Medicare Other | Admitting: Internal Medicine

## 2017-03-08 ENCOUNTER — Encounter: Payer: Self-pay | Admitting: Internal Medicine

## 2017-03-08 ENCOUNTER — Telehealth: Payer: Self-pay

## 2017-03-08 VITALS — BP 114/72 | HR 80 | Temp 98.4°F | Resp 16 | Ht 66.5 in | Wt 188.4 lb

## 2017-03-08 DIAGNOSIS — M5431 Sciatica, right side: Secondary | ICD-10-CM | POA: Diagnosis not present

## 2017-03-08 DIAGNOSIS — Z716 Tobacco abuse counseling: Secondary | ICD-10-CM

## 2017-03-08 DIAGNOSIS — E034 Atrophy of thyroid (acquired): Secondary | ICD-10-CM

## 2017-03-08 DIAGNOSIS — M5432 Sciatica, left side: Secondary | ICD-10-CM

## 2017-03-08 DIAGNOSIS — I10 Essential (primary) hypertension: Secondary | ICD-10-CM

## 2017-03-08 DIAGNOSIS — F411 Generalized anxiety disorder: Secondary | ICD-10-CM | POA: Diagnosis not present

## 2017-03-08 DIAGNOSIS — R61 Generalized hyperhidrosis: Secondary | ICD-10-CM | POA: Diagnosis not present

## 2017-03-08 DIAGNOSIS — E119 Type 2 diabetes mellitus without complications: Secondary | ICD-10-CM | POA: Diagnosis not present

## 2017-03-08 DIAGNOSIS — R232 Flushing: Secondary | ICD-10-CM

## 2017-03-08 DIAGNOSIS — I70219 Atherosclerosis of native arteries of extremities with intermittent claudication, unspecified extremity: Secondary | ICD-10-CM | POA: Diagnosis not present

## 2017-03-08 DIAGNOSIS — I890 Lymphedema, not elsewhere classified: Secondary | ICD-10-CM

## 2017-03-08 MED ORDER — VENLAFAXINE HCL ER 75 MG PO CP24: 150.0000 mg | ORAL_CAPSULE | Freq: Every day | ORAL | 0 refills | Status: DC

## 2017-03-08 NOTE — Progress Notes (Addendum)
Subjective:  Patient ID: Crystal Meyers, female    DOB: October 03, 1945  Age: 71 y.o. MRN: [id number]  CC: The primary encounter diagnosis was Tobacco abuse counseling. Diagnoses of Hypothyroidism due to acquired atrophy of thyroid, Essential hypertension, Diabetes mellitus without complication ([place]), Flushing, Extremity atherosclerosis with intermittent claudication ([place]), Bilateral sciatica, Lymphedema, Generalized anxiety disorder, and Vasomotor flushing were also pertinent to this visit.  HPI Crystal Meyers presents for follow up on multiple issues including diet controlled diabetes, tobacco abuse, COPD and CAD.  Patient arrived 10 minutes late for appointment and is accompanied by her daughter.   She has a history of left sided beast cancer , treated with bilateral mastectomy.  Finished letrozole one year ago,  Has been having recurrent  hot flashes for the past month. No unintentionnal weight loss,  Has a chronic cough due to COPD and ongoing tobacco abuse.        Has been noting calf pain that occurs  with ambulation ,  And  thigh pain with bending over.  No recent arterial evaluation.  Edema.  Was seen by [facility] last month,  Advised to wear compression stockings for managmenet of venous insufficiency and lymphedema.  Has complied  On the average "less than once a week" but notes that her edema improves when she wears them .   Still smoking 1 pack daily .  tried reducing gradually but cites financial stressors as a barrier to quitting.  Grandson buys her cigarettes rather than paying rent or utilities .  Has several grandchildren and their 2 yr old great grandchild living with her.   3 adults, all working,  Not paying her rent or contributing to the household income at .  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       Outpatient Medications Prior to Visit  Medication Sig Dispense Refill  .  aspirin EC 81 MG tablet Take 81 mg by mouth daily.    [name] atorvastatin (LIPITOR) 40 MG tablet Take 1 tablet (40 mg total) by mouth daily. 90 tablet 3  . carvedilol (COREG) 3.125 MG tablet Take 1 tablet (3.125 mg total) by mouth 2 (two) times daily with a meal. 60 tablet 3  . furosemide (LASIX) 20 MG tablet Take 1 tablet (20 mg total) by mouth daily. 30 tablet 3  . ketoconazole (NIZORAL) 2 % cream Apply 1 application topically daily.    [name] levothyroxine (SYNTHROID, LEVOTHROID) 75 MCG tablet Take 1 tablet (75 mcg total) by mouth daily. 90 tablet 3  . lisinopril (PRINIVIL,ZESTRIL) 40 MG tablet TAKE 0.5 TABLET BY MOUTH DAILY 45 tablet 3  . nitroGLYCERIN (NITROSTAT) 0.4 MG SL tablet Place 1 tablet (0.4 mg total) under the tongue every 5 (five) minutes as needed for chest pain. 30 tablet 3  . omeprazole (PRILOSEC) 40 MG capsule Take 1 capsule (40 mg total) by mouth daily. 90 capsule 3  . venlafaxine XR (EFFEXOR-XR) 150 MG 24 hr capsule Take 1 capsule (150 mg total) by mouth daily. 90 capsule 3   No facility-administered medications prior to visit.     Review of Systems;  Patient denies headache, fevers, malaise, unintentional weight loss, skin rash, eye pain, sinus congestion and sinus pain, sore throat, dysphagia,  hemoptysis , cough, dyspnea, wheezing, chest pain, palpitations, orthopnea, edema, abdominal pain, nausea, melena, diarrhea, constipation, flank pain, dysuria, hematuria, urinary  Frequency, nocturia, numbness, tingling, seizures,  Focal  weakness, Loss of consciousness,  Tremor, insomnia, depression, anxiety, and suicidal ideation.      Objective:  BP 114/72 (BP Location: Left Arm, Patient Position: Sitting, Cuff Size: Normal)   Pulse 80   Temp 98.4 F (36.9 C) (Oral)   Resp 16   Ht 5' 6.5" (1.689 m)   Wt 188 lb 6.4 oz (85.5 kg)   SpO2 (!) 80%   BMI 29.95 kg/m   BP Readings from Last 3 Encounters:  03/08/17 114/72  12/29/16 115/70  12/06/16 112/70    Wt Readings from Last 3  Encounters:  03/08/17 188 lb 6.4 oz (85.5 kg)  12/29/16 191 lb 9.6 oz (86.9 kg)  12/06/16 194 lb 6.4 oz (88.2 kg)    General appearance: alert, cooperative and appears stated age Ears: normal TM's and external ear canals both ears Throat: lips, mucosa, and tongue normal; teeth and gums normal Neck: no adenopathy, no carotid bruit, supple, symmetrical, trachea midline and thyroid not enlarged, symmetric, no tenderness/mass/nodules Back: symmetric, no curvature. ROM normal. No CVA tenderness. Lungs: clear to auscultation bilaterally Heart: regular rate and rhythm, S1, S2 normal, no murmur, click, rub or gallop Abdomen: soft, non-tender; bowel sounds normal; no masses,  no organomegaly Pulses: 2+ and symmetric Skin: Skin color, texture, turgor normal. No rashes or lesions Lymph nodes: Cervical, supraclavicular, and axillary nodes normal.  Lab Results  Component Value Date   HGBA1C 6.6 (H) 12/06/2016   HGBA1C 6.3 08/31/2016   HGBA1C 5.9 02/09/2015    Lab Results  Component Value Date   CREATININE 1.12 12/06/2016   CREATININE 0.97 08/31/2016   CREATININE 1.09 (H) 04/14/2016    Lab Results  Component Value Date   WBC 6.5 04/14/2016   HGB 13.1 04/14/2016   HCT 37.2 04/14/2016   PLT 232 04/14/2016   GLUCOSE 137 (H) 12/06/2016   CHOL 142 12/06/2016   TRIG 172.0 (H) 12/06/2016   HDL 40.70 12/06/2016   LDLDIRECT 71.0 08/31/2016   LDLCALC 67 12/06/2016   ALT 23 12/06/2016   AST 20 12/06/2016   NA 139 12/06/2016   K 3.7 12/06/2016   CL 104 12/06/2016   CREATININE 1.12 12/06/2016   BUN 21 12/06/2016   CO2 29 12/06/2016   TSH 2.71 08/31/2016   HGBA1C 6.6 (H) 12/06/2016   MICROALBUR <0.7 08/31/2016    Nm Myocar Multi W/spect W/wall Motion / Ef  Result Date: 10/07/2016  There was no ST segment deviation noted during stress.  No T wave inversion was noted during stress.  The study is normal.  This is a low risk study.  The left ventricular ejection fraction is normal  (55-65%).     Assessment & Plan:   Problem List Items Addressed This Visit    Diabetes mellitus without complication ([place])    Her  random glucose is again elevated but not diagnostic of diabetes. Howver her A1c is now 6.6 .  I recommend she follow a low glycemic index diet and particpate regularly in an aerobic  exercise activity.  We should check an A1c in 3 months.   Lab Results  Component Value Date   HGBA1C 6.6 (H) 12/06/2016         Relevant Orders   Hemoglobin A1c   Essential hypertension   Relevant Orders   Comprehensive metabolic panel   Extremity atherosclerosis with intermittent claudication ([place])    Suspected given calf pain with exertion,  Ongoing tobacco abuse in the setting of CAD COPD and Type 2 DM .  ABIs ordered      Relevant Orders   VAS [place] ABI WITH/WO TBI   Generalized anxiety disorder    Aggravated by financial strain of grandchildren freeloading for the past 2 years.  Increasing effexor dose to 225 mg daily.  Advised to address home situation       Hypothyroidism   Lymphedema    Advised to use compression stockings unless her calf pain is increased with use.  Ruling out PAD with ABIs ordered today       Sciatica neuralgia    Aggravated by household chores . Unclear if PAD is contributing.       Relevant Medications   venlafaxine XR (EFFEXOR-XR) 75 MG 24 hr capsule   Tobacco abuse counseling - Primary    Urged to quit.  Smoking.  Prior intolerance to chantix and wellbutrin.       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   No acute changes noted,  only COPD changes. She is NOT a candidate for HRT therapy        Other Visit Diagnoses    Flushing       Relevant Orders   DG Chest 2 View (Completed)    A total of 25 minutes of face to face time  was spent with patient more than half of which was spent in counselling about the above mentioned conditions  and coordination of care   I have changed Ms. Speagle's venlafaxine XR. I am also having her maintain her aspirin EC, levothyroxine, lisinopril, omeprazole, atorvastatin, nitroGLYCERIN, furosemide, carvedilol, and ketoconazole.  Meds ordered this encounter  Medications  . venlafaxine XR (EFFEXOR-XR) 75 MG 24 hr capsule    Sig: Take 2 capsules (150 mg total) by mouth daily.    Dispense:  30 capsule    Refill:  0    Medications Discontinued During This Encounter  Medication Reason  . venlafaxine XR (EFFEXOR-XR) 150 MG 24 hr capsule Reorder    Follow-up: Return in about 3 months (around 06/08/2017).   [name], MD

## 2017-03-08 NOTE — Telephone Encounter (Signed)
Called to schedule LEA per Dr. Derrel Nip Order  No ans no VM

## 2017-03-08 NOTE — Patient Instructions (Signed)
I recommend increasing the Effexor to 225 mg daily ,  I have sent a 75 mg dose to your pharmacy to add to your 150 mg for a month to see if it helps  I will order ABI'S to assess your arterial circulation

## 2017-03-09 ENCOUNTER — Encounter: Payer: Self-pay | Admitting: Internal Medicine

## 2017-03-09 DIAGNOSIS — I70219 Atherosclerosis of native arteries of extremities with intermittent claudication, unspecified extremity: Secondary | ICD-10-CM | POA: Insufficient documentation

## 2017-03-09 DIAGNOSIS — R232 Flushing: Secondary | ICD-10-CM | POA: Insufficient documentation

## 2017-03-09 LAB — HEMOGLOBIN A1C: Hgb A1c MFr Bld: 6.4 % (ref 4.6–6.5)

## 2017-03-09 NOTE — Assessment & Plan Note (Signed)
Aggravated by financial strain of grandchildren freeloading for the past 2 years.  Increasing effexor dose to 225 mg daily.  Advised to address home situation

## 2017-03-09 NOTE — Assessment & Plan Note (Addendum)
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   No acute changes noted,  only COPD changes. She is NOT a candidate for HRT therapy

## 2017-03-09 NOTE — Assessment & Plan Note (Signed)
Suspected given calf pain with exertion,  Ongoing tobacco abuse in the setting of CAD COPD and Type 2 DM .  ABIs ordered

## 2017-03-09 NOTE — Assessment & Plan Note (Signed)
Her  random glucose is again elevated but not diagnostic of diabetes. Howver her A1c is now 6.6 .  I recommend she follow a low glycemic index diet and particpate regularly in an aerobic  exercise activity.  We should check an A1c in 3 months.   Lab Results  Component Value Date   HGBA1C 6.6 (H) 12/06/2016

## 2017-03-09 NOTE — Assessment & Plan Note (Signed)
Advised to use compression stockings unless her calf pain is increased with use.  Ruling out PAD with ABIs ordered today

## 2017-03-09 NOTE — Assessment & Plan Note (Signed)
Urged to quit.  Smoking.  Prior intolerance to chantix and wellbutrin.

## 2017-03-09 NOTE — Assessment & Plan Note (Signed)
Aggravated by household chores . Unclear if PAD is contributing.

## 2017-03-10 ENCOUNTER — Other Ambulatory Visit (INDEPENDENT_AMBULATORY_CARE_PROVIDER_SITE_OTHER): Payer: Medicare Other

## 2017-03-10 DIAGNOSIS — I10 Essential (primary) hypertension: Secondary | ICD-10-CM

## 2017-03-10 LAB — COMPREHENSIVE METABOLIC PANEL
ALT: 19 U/L (ref 0–35)
AST: 19 U/L (ref 0–37)
Albumin: 4.1 g/dL (ref 3.5–5.2)
Alkaline Phosphatase: 48 U/L (ref 39–117)
BILIRUBIN TOTAL: 0.4 mg/dL (ref 0.2–1.2)
BUN: 16 mg/dL (ref 6–23)
CHLORIDE: 105 meq/L (ref 96–112)
CO2: 28 meq/L (ref 19–32)
Calcium: 9.7 mg/dL (ref 8.4–10.5)
Creatinine, Ser: 1.01 mg/dL (ref 0.40–1.20)
GFR: 57.8 mL/min — AB (ref >60.00)
GLUCOSE: 115 mg/dL — AB (ref 70–99)
Potassium: 4.1 mEq/L (ref 3.5–5.1)
Sodium: 140 mEq/L (ref 135–145)
Total Protein: 6.6 g/dL (ref 6.0–8.3)

## 2017-03-14 ENCOUNTER — Other Ambulatory Visit: Payer: Self-pay | Admitting: Internal Medicine

## 2017-03-14 MED ORDER — VENLAFAXINE HCL ER 75 MG PO CP24: 150.0000 mg | ORAL_CAPSULE | Freq: Every day | ORAL | 2 refills | Status: DC

## 2017-03-15 ENCOUNTER — Encounter: Payer: Self-pay | Admitting: Internal Medicine

## 2017-03-29 ENCOUNTER — Other Ambulatory Visit: Payer: Self-pay | Admitting: Internal Medicine

## 2017-03-29 ENCOUNTER — Ambulatory Visit: Payer: Medicare Other

## 2017-03-29 DIAGNOSIS — I70219 Atherosclerosis of native arteries of extremities with intermittent claudication, unspecified extremity: Secondary | ICD-10-CM

## 2017-03-29 NOTE — Telephone Encounter (Signed)
Mailed unread message to patient.  

## 2017-03-29 NOTE — Telephone Encounter (Signed)
Mailed unread message to patient, thanks 

## 2017-04-01 ENCOUNTER — Encounter: Payer: Self-pay | Admitting: Internal Medicine

## 2017-04-14 ENCOUNTER — Inpatient Hospital Stay (HOSPITAL_BASED_OUTPATIENT_CLINIC_OR_DEPARTMENT_OTHER): Payer: Medicare Other | Admitting: Internal Medicine

## 2017-04-14 ENCOUNTER — Inpatient Hospital Stay: Payer: Medicare Other | Attending: Internal Medicine

## 2017-04-14 VITALS — BP 143/85 | HR 72 | Temp 97.6°F | Resp 20 | Ht 66.5 in | Wt 187.0 lb

## 2017-04-14 DIAGNOSIS — I10 Essential (primary) hypertension: Secondary | ICD-10-CM | POA: Diagnosis not present

## 2017-04-14 DIAGNOSIS — Z170 Estrogen receptor positive status [ER+]: Secondary | ICD-10-CM | POA: Insufficient documentation

## 2017-04-14 DIAGNOSIS — F329 Major depressive disorder, single episode, unspecified: Secondary | ICD-10-CM | POA: Insufficient documentation

## 2017-04-14 DIAGNOSIS — Z9013 Acquired absence of bilateral breasts and nipples: Secondary | ICD-10-CM | POA: Diagnosis not present

## 2017-04-14 DIAGNOSIS — Z9223 Personal history of estrogen therapy: Secondary | ICD-10-CM | POA: Diagnosis not present

## 2017-04-14 DIAGNOSIS — I2510 Atherosclerotic heart disease of native coronary artery without angina pectoris: Secondary | ICD-10-CM | POA: Diagnosis not present

## 2017-04-14 DIAGNOSIS — J449 Chronic obstructive pulmonary disease, unspecified: Secondary | ICD-10-CM | POA: Diagnosis not present

## 2017-04-14 DIAGNOSIS — E039 Hypothyroidism, unspecified: Secondary | ICD-10-CM | POA: Diagnosis not present

## 2017-04-14 DIAGNOSIS — Z853 Personal history of malignant neoplasm of breast: Secondary | ICD-10-CM | POA: Insufficient documentation

## 2017-04-14 DIAGNOSIS — K219 Gastro-esophageal reflux disease without esophagitis: Secondary | ICD-10-CM | POA: Insufficient documentation

## 2017-04-14 DIAGNOSIS — G4730 Sleep apnea, unspecified: Secondary | ICD-10-CM | POA: Insufficient documentation

## 2017-04-14 DIAGNOSIS — F17210 Nicotine dependence, cigarettes, uncomplicated: Secondary | ICD-10-CM | POA: Insufficient documentation

## 2017-04-14 DIAGNOSIS — Z7982 Long term (current) use of aspirin: Secondary | ICD-10-CM | POA: Diagnosis not present

## 2017-04-14 DIAGNOSIS — E7800 Pure hypercholesterolemia, unspecified: Secondary | ICD-10-CM | POA: Insufficient documentation

## 2017-04-14 DIAGNOSIS — Z79899 Other long term (current) drug therapy: Secondary | ICD-10-CM | POA: Insufficient documentation

## 2017-04-14 DIAGNOSIS — I493 Ventricular premature depolarization: Secondary | ICD-10-CM | POA: Insufficient documentation

## 2017-04-14 DIAGNOSIS — C50412 Malignant neoplasm of upper-outer quadrant of left female breast: Secondary | ICD-10-CM

## 2017-04-14 DIAGNOSIS — K2270 Barrett's esophagus without dysplasia: Secondary | ICD-10-CM | POA: Diagnosis not present

## 2017-04-14 LAB — CBC WITH DIFFERENTIAL/PLATELET
BASOS PCT: 1 %
Basophils Absolute: 0 10*3/uL (ref 0–0.1)
EOS ABS: 0.1 10*3/uL (ref 0–0.7)
EOS PCT: 2 %
HCT: 35.2 % (ref 35.0–47.0)
HEMOGLOBIN: 12.4 g/dL (ref 12.0–16.0)
LYMPHS ABS: 2.6 10*3/uL (ref 1.0–3.6)
Lymphocytes Relative: 43 %
MCH: 30.5 pg (ref 26.0–34.0)
MCHC: 35.2 g/dL (ref 32.0–36.0)
MCV: 86.6 fL (ref 80.0–100.0)
MONO ABS: 0.4 10*3/uL (ref 0.2–0.9)
MONOS PCT: 6 %
Neutro Abs: 3 10*3/uL (ref 1.4–6.5)
Neutrophils Relative %: 48 %
PLATELETS: 218 10*3/uL (ref 150–440)
RBC: 4.07 MIL/uL (ref 3.80–5.20)
RDW: 13.5 % (ref 11.5–14.5)
WBC: 6.1 10*3/uL (ref 3.6–11.0)

## 2017-04-14 NOTE — Progress Notes (Signed)
Crystal Meyers  Patient Care Team: Crystal Mc, MD as PCP - General (Internal Medicine) Crystal Gust, MD (Unknown Physician Specialty)  Cancer Staging No matching staging information was found for the patient.   Oncology History   1. 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 Low Oncotype DX score. Diagnosed in June 2010. 2. Status post-bilateral mastectomy and reconstructive surgery. 3. Started Femara approx. 2011.Crystal Meyers 4.Breast cancer index revealed a 5.2% increase of late recurrence but low likelihood of benefit with extended adjuvant therapy (March of 2017) Letrozole was discontinued from March of 2017     Carcinoma of upper-outer quadrant of left breast in female, estrogen receptor positive ([place])     INTERVAL HISTORY:  Crystal Meyers 71 y.o.  female pleasant patient above history of stage I breast cancer is here for follow-up.  Denies any lumps or bumps. Appetite is good. No weight loss. No headaches. No nausea no vomiting.  Denies any unusual back pain. Denies any chest pain or shortness of the breast.  REVIEW OF SYSTEMS:  A complete 10 point review of system is done which is negative except mentioned above/history of present illness.   PAST MEDICAL HISTORY :  Past Medical History:  Diagnosis Date  . Barrett's esophagus   . Breast cancer ([place]) 7/10   left , invasive lobular carcinoma  . Complication of anesthesia    difficulty waking up and sleep apnea, low sats  . COPD (chronic obstructive pulmonary disease) ([place])   . Coronary artery disease   . Depression   . GERD (gastroesophageal reflux disease)   . Hypercholesteremia   . Hypertension   . Hypothyroidism   . Personal history of tobacco use, presenting hazards to health 05/06/2015  . Personal history of tobacco use, presenting hazards to health 05/06/2015  . Pneumonia     hx of  . PVC (premature ventricular contraction)   . Sleep apnea    wears CPAP nightly  . Trigeminal neuralgia     PAST SURGICAL HISTORY :   Past Surgical History:  Procedure Laterality Date  . BREAST SURGERY Bilateral 2011   dbl mastectomy  . COLONOSCOPY W/ POLYPECTOMY    . CRANIECTOMY Right 01/21/2016   Procedure: Microvascular Decompression - right trigemminal nerve;  Surgeon: [name], MD;  Location: [facility];  Service: Neurosurgery;  Laterality: Right;  . CRANIOTOMY Right 02/03/2016   Procedure: REPAIR OF CEREBROSPINAL FLUID LEAK AND HARVEST ABDOMINAL FAT GRAFT;  Surgeon: [name], MD;  Location: [facility];  Service: Neurosurgery;  Laterality: Right;  . LUMBAR LAMINECTOMY/DECOMPRESSION MICRODISCECTOMY Left 08/11/2015   Procedure: Left Lumbar four-five microdiscectomy;  Surgeon: [name], MD;  Location: [place];  Service: Neurosurgery;  Laterality: Left;  Crystal Meyers MASTECTOMY  05/2009   bilateral, reconstructive surgery 09/2009  . MASTECTOMY     Bilateral     FAMILY HISTORY :   Family History  Problem Relation Age of Onset  . Mental illness Mother   . Diabetes Mother   . Hypertension Mother   . Hyperlipidemia Mother   . Hypertension Father   . Hyperlipidemia Father   . Stroke Father   . Heart attack Father   . Cancer Brother        Lung  . Diabetes Brother   . Diabetes Brother     SOCIAL HISTORY:   Social History  Substance Use Topics  . Smoking status: Current  Every Day Smoker    Packs/day: 1.00    Years: 54.00    Types: Cigarettes  . Smokeless tobacco: Never Used     Comment: Refused cessation material  . Alcohol use No    ALLERGIES:  is allergic to lamisil [terbinafine hcl] and no known allergies.  MEDICATIONS:  Current Outpatient Prescriptions  Medication Sig Dispense Refill  . aspirin EC 81 MG tablet Take 81 mg by mouth daily.    Crystal Meyers atorvastatin (LIPITOR) 40 MG tablet Take 1 tablet (40 mg total) by mouth daily. 90  tablet 3  . levothyroxine (SYNTHROID, LEVOTHROID) 75 MCG tablet Take 1 tablet (75 mcg total) by mouth daily. 90 tablet 3  . lisinopril (PRINIVIL,ZESTRIL) 40 MG tablet TAKE 1/2 TABLET BY MOUTH DAILY 45 tablet 1  . omeprazole (PRILOSEC) 40 MG capsule Take 1 capsule (40 mg total) by mouth daily. 90 capsule 3  . venlafaxine XR (EFFEXOR-XR) 75 MG 24 hr capsule Take 2 capsules (150 mg total) by mouth daily. 30 capsule 2  . nitroGLYCERIN (NITROSTAT) 0.4 MG SL tablet Place 1 tablet (0.4 mg total) under the tongue every 5 (five) minutes as needed for chest pain. (Patient not taking: Reported on 04/14/2017) 30 tablet 3   No current facility-administered medications for this visit.     PHYSICAL EXAMINATION: ECOG PERFORMANCE STATUS: 0 - Asymptomatic  BP (!) 143/85 (Patient Position: Sitting)   Pulse 72   Temp 97.6 F (36.4 C) (Tympanic)   Resp 20   Ht 5' 6.5" (1.689 m)   Wt 187 lb (84.8 kg)   BMI 29.73 kg/m   Filed Weights   04/14/17 1504  Weight: 187 lb (84.8 kg)    GENERAL: Well-nourished well-developed; Alert, no distress and comfortable.   With her daughter EYES: no pallor or icterus OROPHARYNX: no thrush or ulceration; good dentition  NECK: supple, no masses felt LYMPH:  no palpable lymphadenopathy in the cervical, axillary or inguinal regions LUNGS: clear to auscultation and  No wheeze or crackles HEART/CVS: regular rate & rhythm and no murmurs; No lower extremity edema ABDOMEN:abdomen soft, non-tender and normal bowel sounds Musculoskeletal:no cyanosis of digits and no clubbing  PSYCH: alert & oriented x 3 with fluent speech NEURO: no focal motor/sensory deficits SKIN:  no rashes or significant lesions Right and left BREAST exam [in the presence of nurse]- bilateral reconstructed breasts. No lumps or bumps noted Surgical scars noted.    LABORATORY DATA:  I have reviewed the data as listed    Component Value Date/Time   NA 140 03/10/2017 1146   NA 136 (A) 09/23/2014   NA 140  03/26/2014 1448   K 4.1 03/10/2017 1146   K 4.0 03/26/2014 1448   CL 105 03/10/2017 1146   CL 104 03/26/2014 1448   CO2 28 03/10/2017 1146   CO2 30 03/26/2014 1448   GLUCOSE 115 (H) 03/10/2017 1146   GLUCOSE 91 03/26/2014 1448   BUN 16 03/10/2017 1146   BUN 14 09/23/2014   BUN 19 (H) 03/26/2014 1448   CREATININE 1.01 03/10/2017 1146   CREATININE 1.12 03/26/2014 1448   CALCIUM 9.7 03/10/2017 1146   CALCIUM 9.5 03/26/2014 1448   PROT 6.6 03/10/2017 1146   PROT 6.9 03/26/2014 1448   ALBUMIN 4.1 03/10/2017 1146   ALBUMIN 3.7 03/26/2014 1448   AST 19 03/10/2017 1146   AST 23 03/26/2014 1448   ALT 19 03/10/2017 1146   ALT 35 03/26/2014 1448   ALKPHOS 48 03/10/2017 1146   ALKPHOS 45 (L)  03/26/2014 1448   BILITOT 0.4 03/10/2017 1146   BILITOT 0.3 03/26/2014 1448   GFRNONAA 51 (L) 04/14/2016 1510   GFRNONAA 52 (L) 03/26/2014 1448   GFRAA 59 (L) 04/14/2016 1510   GFRAA 60 (L) 03/26/2014 1448    No results found for: SPEP, UPEP  Lab Results  Component Value Date   WBC 6.1 04/14/2017   NEUTROABS 3.0 04/14/2017   HGB 12.4 04/14/2017   HCT 35.2 04/14/2017   MCV 86.6 04/14/2017   PLT 218 04/14/2017      Chemistry      Component Value Date/Time   NA 140 03/10/2017 1146   NA 136 (A) 09/23/2014   NA 140 03/26/2014 1448   K 4.1 03/10/2017 1146   K 4.0 03/26/2014 1448   CL 105 03/10/2017 1146   CL 104 03/26/2014 1448   CO2 28 03/10/2017 1146   CO2 30 03/26/2014 1448   BUN 16 03/10/2017 1146   BUN 14 09/23/2014   BUN 19 (H) 03/26/2014 1448   CREATININE 1.01 03/10/2017 1146   CREATININE 1.12 03/26/2014 1448   GLU 113 09/23/2014      Component Value Date/Time   CALCIUM 9.7 03/10/2017 1146   CALCIUM 9.5 03/26/2014 1448   ALKPHOS 48 03/10/2017 1146   ALKPHOS 45 (L) 03/26/2014 1448   AST 19 03/10/2017 1146   AST 23 03/26/2014 1448   ALT 19 03/10/2017 1146   ALT 35 03/26/2014 1448   BILITOT 0.4 03/10/2017 1146   BILITOT 0.3 03/26/2014 1448       RADIOGRAPHIC  STUDIES: I have personally reviewed the radiological images as listed and agreed with the findings in the report. No results found.   ASSESSMENT & PLAN:  Carcinoma of upper-outer quadrant of left breast in female, estrogen receptor positive ([place]) # Breast cancer stage I ER/PR positive left side status post mastectomy bilateral. Finished letrozole March 2017.  [7 years]. Low risk-oncotype..  # Clinically no evidence of recurrence.  # Follow-up with [place] in one year./Labs.     Orders Placed This Encounter  Procedures  . CBC with Differential/Platelet    Standing Status:   Future    Standing Expiration Date:   10/14/2018  . Comprehensive metabolic panel    Standing Status:   Future    Standing Expiration Date:   10/14/2018   All questions were answered. The patient knows to call the clinic with any problems, questions or concerns.      Cammie Sickle, MD 04/14/2017 3:37 PM

## 2017-04-14 NOTE — Assessment & Plan Note (Addendum)
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 Finished letrozole March 2017.  [7 years]. Low risk-oncotype..  # Clinically no evidence of recurrence.  # Follow-up with [place] in one year./Labs.

## 2017-04-17 NOTE — Telephone Encounter (Signed)
Mailed unread message

## 2017-05-25 ENCOUNTER — Telehealth: Payer: Self-pay | Admitting: *Deleted

## 2017-05-25 DIAGNOSIS — Z87891 Personal history of nicotine dependence: Secondary | ICD-10-CM

## 2017-05-25 DIAGNOSIS — Z122 Encounter for screening for malignant neoplasm of respiratory organs: Secondary | ICD-10-CM

## 2017-05-25 NOTE — Telephone Encounter (Signed)
Notified patient that annual lung cancer screening low dose CT scan is due currently or will be in near future. Confirmed that patient is within the age range of 55-77, and asymptomatic, (no signs or symptoms of lung cancer). Patient denies illness that would prevent curative treatment for lung cancer if found. Verified smoking history, (current, 73 pack year). The shared decision making visit was done 04/17/14. Patient is agreeable for CT scan being scheduled.

## 2017-05-31 ENCOUNTER — Ambulatory Visit
Admission: RE | Admit: 2017-05-31 | Discharge: 2017-05-31 | Disposition: A | Discharge status: Home / Self Care | Payer: Medicare Other | Source: Ambulatory Visit | Attending: Oncology | Admitting: Oncology

## 2017-05-31 DIAGNOSIS — F17210 Nicotine dependence, cigarettes, uncomplicated: Secondary | ICD-10-CM | POA: Diagnosis not present

## 2017-05-31 DIAGNOSIS — K760 Fatty (change of) liver, not elsewhere classified: Secondary | ICD-10-CM | POA: Insufficient documentation

## 2017-05-31 DIAGNOSIS — Z122 Encounter for screening for malignant neoplasm of respiratory organs: Secondary | ICD-10-CM

## 2017-05-31 DIAGNOSIS — I2510 Atherosclerotic heart disease of native coronary artery without angina pectoris: Secondary | ICD-10-CM | POA: Diagnosis not present

## 2017-05-31 DIAGNOSIS — I700 Atherosclerosis of aorta: Secondary | ICD-10-CM | POA: Diagnosis not present

## 2017-05-31 DIAGNOSIS — Z87891 Personal history of nicotine dependence: Secondary | ICD-10-CM | POA: Insufficient documentation

## 2017-06-06 ENCOUNTER — Encounter: Payer: Self-pay | Admitting: *Deleted

## 2017-06-09 ENCOUNTER — Ambulatory Visit (INDEPENDENT_AMBULATORY_CARE_PROVIDER_SITE_OTHER): Payer: Medicare Other | Admitting: Internal Medicine

## 2017-06-09 ENCOUNTER — Encounter: Payer: Self-pay | Admitting: Internal Medicine

## 2017-06-09 VITALS — BP 118/74 | HR 81 | Temp 98.0°F | Resp 15 | Ht 66.0 in | Wt 191.4 lb

## 2017-06-09 DIAGNOSIS — R918 Other nonspecific abnormal finding of lung field: Secondary | ICD-10-CM | POA: Diagnosis not present

## 2017-06-09 DIAGNOSIS — F411 Generalized anxiety disorder: Secondary | ICD-10-CM

## 2017-06-09 DIAGNOSIS — Z23 Encounter for immunization: Secondary | ICD-10-CM | POA: Diagnosis not present

## 2017-06-09 DIAGNOSIS — I70219 Atherosclerosis of native arteries of extremities with intermittent claudication, unspecified extremity: Secondary | ICD-10-CM | POA: Diagnosis not present

## 2017-06-09 DIAGNOSIS — E119 Type 2 diabetes mellitus without complications: Secondary | ICD-10-CM

## 2017-06-09 DIAGNOSIS — I10 Essential (primary) hypertension: Secondary | ICD-10-CM | POA: Diagnosis not present

## 2017-06-09 LAB — POCT GLYCOSYLATED HEMOGLOBIN (HGB A1C): Hemoglobin A1C: 6.5

## 2017-06-09 NOTE — Patient Instructions (Addendum)
Danton Clap now makes a frozen breakfast frittata and an egg sandwhich  that can be microwaved in 2 minutes and is very low carb. Frittats are similar to quiches without the crust   Recommend trying any of the following  Low Carb high Protein premixed Shakes:   Premier Protein  (vanilla and chocolate  And caramel)  Atkins Advantage Muscle Milk EAS AdvantEdge   All of these are available at [employer], [place],  [place]  And taste good    Return in March , please have fasting labs done prior to visit

## 2017-06-09 NOTE — Progress Notes (Signed)
Subjective:  Patient ID: Crystal Meyers, female    DOB: 1946-05-08  Age: 71 y.o. MRN: [id number]  CC: The primary encounter diagnosis was Diabetes mellitus without complication ([place]). Diagnoses of Encounter for immunization, Essential hypertension, Generalized anxiety disorder, and Multiple pulmonary nodules were also pertinent to this visit.  HPI Crystal Meyers presents for FOLLOW UP on hypertension, hyperlipidemia, depression  and tobacco abuse.  She is accompanied by her daughter .    Since her last visit her dose of effexor was increased to 225mg  She  was sent for vascular evaluation:  ABIS  both were > 1  Type 2 DM :  Diagnosed with  a1c of 6.6 in May,   LDL 67 in may   Tobacco abuse: smoking less than a  Pack a daily .  Has moved out of the house and into a s  Condominium,  No longer taking care of freeloading grandchildren.  Still sees her great grandson.   Less stress  persistent left  Great toe pain on left foot. Was treated onychomycosis  By Dr Nicole Kindred with Lamisil which was stopped bc it gave her a bad rash .  Has not been bad.   Has moved out of house and into a condo.  tehe freeloading  grandchildren have letthe house fall into disarray , she is transferring ownership of the house to her sister Crystal Meyers    s  Crystal Results  Component Value Date   HGBA1C 6.5 06/09/2017    Outpatient Medications Prior to Visit  Medication Sig Dispense Refill  . atorvastatin (LIPITOR) 40 MG tablet Take 1 tablet (40 mg total) by mouth daily. 90 tablet 3  . levothyroxine (SYNTHROID, LEVOTHROID) 75 MCG tablet Take 1 tablet (75 mcg total) by mouth daily. 90 tablet 3  . lisinopril (PRINIVIL,ZESTRIL) 40 MG tablet TAKE 1/2 TABLET BY MOUTH DAILY 45 tablet 1  . nitroGLYCERIN (NITROSTAT) 0.4 MG SL tablet Place 1 tablet (0.4 mg total) under the tongue every 5 (five) minutes as needed for chest pain. 30 tablet 3  . omeprazole (PRILOSEC) 40 MG  capsule Take 1 capsule (40 mg total) by mouth daily. 90 capsule 3  . venlafaxine XR (EFFEXOR-XR) 75 MG 24 hr capsule Take 2 capsules (150 mg total) by mouth daily. 30 capsule 2  . aspirin EC 81 MG tablet Take 81 mg by mouth daily.     No facility-administered medications prior to visit.     Review of Systems;  Patient denies headache, fevers, malaise, unintentional weight loss, skin rash, eye pain, sinus congestion and sinus pain, sore throat, dysphagia,  hemoptysis , cough, dyspnea, wheezing, chest pain, palpitations, orthopnea, edema, abdominal pain, nausea, melena, diarrhea, constipation, flank pain, dysuria, hematuria, urinary  Frequency, nocturia, numbness, tingling, seizures,  Focal weakness, Loss of consciousness,  Tremor, insomnia, depression, anxiety, and suicidal ideation.      Objective:  BP 118/74 (BP Location: Right Arm, Patient Position: Sitting, Cuff Size: Normal)   Pulse 81   Temp 98 F (36.7 C) (Oral)   Resp 15   Ht 5\' 6"  (1.676 m)   Wt 191 lb 6.4 oz (86.8 kg)   SpO2 96%   BMI 30.89 kg/m   BP Readings from Last 3 Encounters:  06/09/17 118/74  04/14/17 (!) 143/85  03/08/17 114/72    Wt Readings from Last 3 Encounters:  06/09/17 191 lb 6.4 oz (86.8 kg)  05/31/17 186 lb (84.4 kg)  04/14/17  187 lb (84.8 kg)    General appearance: alert, cooperative and appears stated age Ears: normal TM's and external ear canals both ears Throat: lips, mucosa, and tongue normal; teeth and gums normal Neck: no adenopathy, no carotid bruit, supple, symmetrical, trachea midline and thyroid not enlarged, symmetric, no tenderness/mass/nodules Back: symmetric, no curvature. ROM normal. No CVA tenderness. Lungs: clear to auscultation bilaterally Heart: regular rate and rhythm, S1, S2 normal, no murmur, click, rub or gallop Abdomen: soft, non-tender; bowel sounds normal; no masses,  no organomegaly Pulses: 2+ and symmetric Skin: Skin color, texture, turgor normal. No rashes or  lesions Lymph nodes: Cervical, supraclavicular, and axillary nodes normal.  Crystal Results  Component Value Date   HGBA1C 6.5 06/09/2017   HGBA1C 6.4 03/08/2017   HGBA1C 6.6 (H) 12/06/2016    Crystal Results  Component Value Date   CREATININE 1.01 03/10/2017   CREATININE 1.12 12/06/2016   CREATININE 0.97 Meyers    Crystal Results  Component Value Date   WBC 6.1 04/14/2017   HGB 12.4 04/14/2017   HCT 35.2 04/14/2017   PLT 218 04/14/2017   GLUCOSE 115 (H) 03/10/2017   CHOL 142 12/06/2016   TRIG 172.0 (H) 12/06/2016   HDL 40.70 12/06/2016   LDLDIRECT 71.0 Meyers   LDLCALC 67 12/06/2016   ALT 19 03/10/2017   AST 19 03/10/2017   NA 140 03/10/2017   K 4.1 03/10/2017   CL 105 03/10/2017   CREATININE 1.01 03/10/2017   BUN 16 03/10/2017   CO2 28 03/10/2017   TSH 2.71 Meyers   HGBA1C 6.5 06/09/2017   MICROALBUR <0.7 Meyers    Ct Chest Lung Cancer Screening Low Dose Wo Contrast  Result Date: 06/05/2017 CLINICAL DATA:  Seventy eighty pack-year smoking history. Current smoker. Breast cancer with bilateral mastectomies and breast implants 8 years ago. EXAM: CT CHEST WITHOUT CONTRAST LOW-DOSE FOR LUNG CANCER SCREENING TECHNIQUE: Multidetector CT imaging of the chest was performed following the standard protocol without IV contrast. COMPARISON:  Chest radiograph 03/08/2017.  Chest CT 05/24/2016. FINDINGS: Cardiovascular: Aortic atherosclerosis. Normal heart size, without pericardial effusion. Multivessel coronary artery atherosclerosis. Mediastinum/Nodes: No mediastinal or definite hilar adenopathy, given limitations of unenhanced CT. No axillary adenopathy. No internal mammary adenopathy. Lungs/Pleura: No pleural fluid. Calcified and noncalcified pulmonary nodules are not significantly changed. No suspicious or enlarging nodules. Upper Abdomen: Mild hepatic steatosis. Normal imaged portions of the spleen, stomach, pancreas, gallbladder, adrenal glands, kidneys. Musculoskeletal:  Bilateral breast implants. Mild thoracic spondylosis. IMPRESSION: 1. Lung-RADS 2, benign appearance or behavior. Continue annual screening with low-dose chest CT without contrast in 12 months. 2. Coronary artery atherosclerosis. Aortic Atherosclerosis (ICD10-I70.0). 3. Mild hepatic steatosis. Electronically Signed   By: Abigail Miyamoto M.D.   On: 06/05/2017 13:15    Assessment & Plan:   Problem List Items Addressed This Visit    Diabetes mellitus without complication ([place]) - Primary    Historically well-controlled on diet alone  Patient is up-to-date on eye exams and foot exam is normal today. Patient is due for urine microalbumin to creatinine ratio at next visit. Patient is tolerating statin therapy for CAD risk reduction and on ACE/ARB for reduction in proteinuria.    Crystal Results  Component Value Date   HGBA1C 6.5 06/09/2017   Crystal Meyers         Relevant Orders   POCT HgB A1C (Completed)   Lipid panel   Hemoglobin A1c   Comprehensive metabolic panel  Microalbumin / creatinine urine ratio   Essential hypertension    Well controlled on current regimen. Renal function stable, no changes today.  Crystal Results  Component Value Date   CREATININE 1.01 03/10/2017   Crystal Results  Component Value Date   NA 140 03/10/2017   K 4.1 03/10/2017   CL 105 03/10/2017   CO2 28 03/10/2017         Generalized anxiety disorder    Improved with higher dose of effexor and major life changes .  No changes today      Multiple pulmonary nodules    Annual CT done shows no significant change to nodules November 2018       Other Visit Diagnoses    Encounter for immunization       Relevant Orders   Flu vaccine HIGH DOSE PF (Completed)      I have discontinued Crystal Meyers aspirin EC. I am also having her maintain her levothyroxine, omeprazole, atorvastatin, nitroGLYCERIN, venlafaxine XR, and lisinopril.  No orders of the defined  types were placed in this encounter.   Medications Discontinued During This Encounter  Medication Reason  . aspirin EC 81 MG tablet Patient has not taken in last 30 days    Follow-up: No Follow-up on file.   [name], MD

## 2017-06-09 NOTE — Progress Notes (Signed)
67

## 2017-06-11 NOTE — Assessment & Plan Note (Signed)
Annual CT done shows no significant change to nodules November 2018

## 2017-06-11 NOTE — Assessment & Plan Note (Signed)
Improved with higher dose of effexor and major life changes .  No changes today

## 2017-06-11 NOTE — Assessment & Plan Note (Signed)
Historically well-controlled on diet alone  Patient is up-to-date on eye exams and foot exam is normal today. Patient is due for urine microalbumin to creatinine ratio at next visit. Patient is tolerating statin therapy for CAD risk reduction and on ACE/ARB for reduction in proteinuria.    Lab Results  Component Value Date   HGBA1C 6.5 06/09/2017   Lab Results  Component Value Date   MICROALBUR <0.7 08/31/2016

## 2017-06-11 NOTE — Assessment & Plan Note (Signed)
Well controlled on current regimen. Renal function stable, no changes today.  Lab Results  Component Value Date   CREATININE 1.01 03/10/2017   Lab Results  Component Value Date   NA 140 03/10/2017   K 4.1 03/10/2017   CL 105 03/10/2017   CO2 28 03/10/2017

## 2017-07-06 DIAGNOSIS — E119 Type 2 diabetes mellitus without complications: Secondary | ICD-10-CM | POA: Diagnosis not present

## 2017-07-06 DIAGNOSIS — H524 Presbyopia: Secondary | ICD-10-CM | POA: Diagnosis not present

## 2017-07-06 DIAGNOSIS — E089 Diabetes mellitus due to underlying condition without complications: Secondary | ICD-10-CM | POA: Diagnosis not present

## 2017-07-06 LAB — HM DIABETES EYE EXAM

## 2017-07-21 ENCOUNTER — Ambulatory Visit (INDEPENDENT_AMBULATORY_CARE_PROVIDER_SITE_OTHER): Payer: Medicare Other | Admitting: Internal Medicine

## 2017-07-21 ENCOUNTER — Encounter: Payer: Self-pay | Admitting: Internal Medicine

## 2017-07-21 VITALS — BP 120/68 | HR 77 | Temp 98.1°F | Resp 15 | Ht 66.0 in | Wt 194.4 lb

## 2017-07-21 DIAGNOSIS — H02403 Unspecified ptosis of bilateral eyelids: Secondary | ICD-10-CM | POA: Diagnosis not present

## 2017-07-21 DIAGNOSIS — G4733 Obstructive sleep apnea (adult) (pediatric): Secondary | ICD-10-CM | POA: Diagnosis not present

## 2017-07-21 NOTE — Progress Notes (Signed)
Subjective:  Patient ID: Crystal Meyers, female    DOB: [birth year]  Age: 72 y.o. MRN: [id number]  CC: The primary encounter diagnosis was Ptosis, bilateral. A diagnosis of Sleep apnea, obstructive was also pertinent to this visit.  HPI Crystal Meyers presents for follow up on OSA  .    Patient was diagnosed with OSA  Many years. Ago.  Her last sleep study was in  Jan 2014 with AHI of 10 and and RDI off 11/hr  She was re fitted for CPAP several weeks later and has been wearing her CPAP every night. Patient is using her CPAP  Every night  A minimum of 7 or 8 hours.  The setting is 17 cm H20 and room air humidified,   Nose mask .  The machine is very loud and makes it difficult for her and other family members to sleep.   It often cuts off and requires  a restart.    Outpatient Medications Prior to Visit  Medication Sig Dispense Refill  . atorvastatin (LIPITOR) 40 MG tablet Take 1 tablet (40 mg total) by mouth daily. 90 tablet 3  . levothyroxine (SYNTHROID, LEVOTHROID) 75 MCG tablet Take 1 tablet (75 mcg total) by mouth daily. 90 tablet 3  . lisinopril (PRINIVIL,ZESTRIL) 40 MG tablet TAKE 1/2 TABLET BY MOUTH DAILY 45 tablet 1  . nitroGLYCERIN (NITROSTAT) 0.4 MG SL tablet Place 1 tablet (0.4 mg total) under the tongue every 5 (five) minutes as needed for chest pain. 30 tablet 3  . omeprazole (PRILOSEC) 40 MG capsule Take 1 capsule (40 mg total) by mouth daily. 90 capsule 3  . venlafaxine XR (EFFEXOR-XR) 75 MG 24 hr capsule Take 2 capsules (150 mg total) by mouth daily. 30 capsule 2   No facility-administered medications prior to visit.     Review of Systems;  Patient denies headache, fevers, malaise, unintentional weight loss, skin rash, eye pain, sinus congestion and sinus pain, sore throat, dysphagia,  hemoptysis , cough, dyspnea, wheezing, chest pain, palpitations, orthopnea, edema, abdominal pain, nausea, melena, diarrhea, constipation, flank pain, dysuria, hematuria, urinary  Frequency,  nocturia, numbness, tingling, seizures,  Focal weakness, Loss of consciousness,  Tremor, insomnia, depression, anxiety, and suicidal ideation.      Objective:  BP 120/68 (BP Location: Right Arm, Patient Position: Sitting, Cuff Size: Large)   Pulse 77   Temp 98.1 F (36.7 C) (Oral)   Resp 15   Ht 5\' 6"  (1.676 m)   Wt 194 lb 6.4 oz (88.2 kg)   SpO2 96%   BMI 31.38 kg/m   BP Readings from Last 3 Encounters:  07/21/17 120/68  06/09/17 118/74  04/14/17 (!) 143/85    Wt Readings from Last 3 Encounters:  07/21/17 194 lb 6.4 oz (88.2 kg)  06/09/17 191 lb 6.4 oz (86.8 kg)  05/31/17 186 lb (84.4 kg)    General appearance: alert, cooperative and appears stated age Ears: normal TM's and external ear canals both ears Throat: lips, mucosa, and tongue normal; teeth and gums normal Neck: no adenopathy, no carotid bruit, supple, symmetrical, trachea midline and thyroid not enlarged, symmetric, no tenderness/mass/nodules Back: symmetric, no curvature. ROM normal. No CVA tenderness. Lungs: clear to auscultation bilaterally Heart: regular rate and rhythm, S1, S2 normal, no murmur, click, rub or gallop Abdomen: soft, non-tender; bowel sounds normal; no masses,  no organomegaly Pulses: 2+ and symmetric Skin: Skin color, texture, turgor normal. No rashes or lesions Lymph nodes: Cervical, supraclavicular, and axillary nodes normal.  Lab  Results  Component Value Date   HGBA1C 6.5 06/09/2017   HGBA1C 6.4 03/08/2017   HGBA1C 6.6 (H) 12/06/2016    Lab Results  Component Value Date   CREATININE 1.01 03/10/2017   CREATININE 1.12 12/06/2016   CREATININE 0.97 08/31/2016    Lab Results  Component Value Date   WBC 6.1 04/14/2017   HGB 12.4 04/14/2017   HCT 35.2 04/14/2017   PLT 218 04/14/2017   GLUCOSE 115 (H) 03/10/2017   CHOL 142 12/06/2016   TRIG 172.0 (H) 12/06/2016   HDL 40.70 12/06/2016   LDLDIRECT 71.0 08/31/2016   LDLCALC 67 12/06/2016   ALT 19 03/10/2017   AST 19 03/10/2017     NA 140 03/10/2017   K 4.1 03/10/2017   CL 105 03/10/2017   CREATININE 1.01 03/10/2017   BUN 16 03/10/2017   CO2 28 03/10/2017   TSH 2.71 08/31/2016   HGBA1C 6.5 06/09/2017   MICROALBUR <0.7 08/31/2016    Ct Chest Lung Cancer Screening Low Dose Wo Contrast  Result Date: 06/05/2017 CLINICAL DATA:  Seventy eighty pack-year smoking history. Current smoker. Breast cancer with bilateral mastectomies and breast implants 8 years ago. EXAM: CT CHEST WITHOUT CONTRAST LOW-DOSE FOR LUNG CANCER SCREENING TECHNIQUE: Multidetector CT imaging of the chest was performed following the standard protocol without IV contrast. COMPARISON:  Chest radiograph 03/08/2017.  Chest CT 05/24/2016. FINDINGS: Cardiovascular: Aortic atherosclerosis. Normal heart size, without pericardial effusion. Multivessel coronary artery atherosclerosis. Mediastinum/Nodes: No mediastinal or definite hilar adenopathy, given limitations of unenhanced CT. No axillary adenopathy. No internal mammary adenopathy. Lungs/Pleura: No pleural fluid. Calcified and noncalcified pulmonary nodules are not significantly changed. No suspicious or enlarging nodules. Upper Abdomen: Mild hepatic steatosis. Normal imaged portions of the spleen, stomach, pancreas, gallbladder, adrenal glands, kidneys. Musculoskeletal: Bilateral breast implants. Mild thoracic spondylosis. IMPRESSION: 1. Lung-RADS 2, benign appearance or behavior. Continue annual screening with low-dose chest CT without contrast in 12 months. 2. Coronary artery atherosclerosis. Aortic Atherosclerosis (ICD10-I70.0). 3. Mild hepatic steatosis. Electronically Signed   By: Abigail Miyamoto M.D.   On: 06/05/2017 13:15    Assessment & Plan:   Problem List Items Addressed This Visit    Sleep apnea, obstructive    Diagnosed by sleep study. She is wearing her CPAP every night a minimum of 6 hours per night and notes improved daytime wakefulness and decreased fatigue , but needs a new mask, because her  current one is loud and cuts off intermittently.        Other Visit Diagnoses    Ptosis, bilateral    -  Primary   Relevant Orders   Ambulatory referral to Ophthalmology      I am having Baneza D. Pavia maintain her levothyroxine, omeprazole, atorvastatin, nitroGLYCERIN, venlafaxine XR, and lisinopril.  No orders of the defined types were placed in this encounter.   There are no discontinued medications.  Follow-up: Return for fatign labs march 2 .   [name], MD

## 2017-07-21 NOTE — Patient Instructions (Addendum)
Go to [facility]  For your eye glasses;  You will save $$$$    referral to [facility] for management of bilateral ptsosis  Return for fasting labs on or after March 2   CPAP machine will be ordered

## 2017-07-23 NOTE — Assessment & Plan Note (Signed)
Diagnosed by sleep study. She is wearing her CPAP every night a minimum of 6 hours per night and notes improved daytime wakefulness and decreased fatigue , but needs a new mask, because her current one is loud and cuts off intermittently.

## 2017-08-02 ENCOUNTER — Encounter: Payer: Self-pay | Admitting: Internal Medicine

## 2017-08-03 ENCOUNTER — Other Ambulatory Visit: Payer: Self-pay | Admitting: Internal Medicine

## 2017-08-03 DIAGNOSIS — H02403 Unspecified ptosis of bilateral eyelids: Secondary | ICD-10-CM

## 2017-08-04 ENCOUNTER — Telehealth: Payer: Self-pay | Admitting: Internal Medicine

## 2017-08-04 MED ORDER — LEVOTHYROXINE SODIUM 75 MCG PO TABS: 75.0000 ug | ORAL_TABLET | Freq: Every day | ORAL | 0 refills | Status: DC

## 2017-08-04 MED ORDER — ATORVASTATIN CALCIUM 40 MG PO TABS: 40.0000 mg | ORAL_TABLET | Freq: Every day | ORAL | 1 refills | Status: DC

## 2017-08-04 MED ORDER — OMEPRAZOLE 40 MG PO CPDR: 40.0000 mg | DELAYED_RELEASE_CAPSULE | Freq: Every day | ORAL | 1 refills | Status: DC

## 2017-08-04 MED ORDER — NITROGLYCERIN 0.4 MG SL SUBL: 0.4000 mg | SUBLINGUAL_TABLET | SUBLINGUAL | 3 refills | Status: DC | PRN

## 2017-08-04 MED ORDER — VENLAFAXINE HCL ER 75 MG PO CP24: 150.0000 mg | ORAL_CAPSULE | Freq: Every day | ORAL | 5 refills | Status: DC

## 2017-08-04 MED ORDER — LISINOPRIL 40 MG PO TABS: 20.0000 mg | ORAL_TABLET | Freq: Every day | ORAL | 5 refills | Status: DC

## 2017-08-04 NOTE — Telephone Encounter (Signed)
Medication have been refilled.  

## 2017-08-04 NOTE — Telephone Encounter (Signed)
Copied from [place] [id number]. Topic: Quick Communication - Rx Refill/Question >> Aug 04, 2017 11:12 AM Crystal Meyers wrote: Medication: ALL   Has the patient contacted their pharmacy? Yes.    Pt states pharmacy has sent a request for all Rx's but have not had a response back  (Agent: If no, request that the patient contact the pharmacy for the refill.)   Preferred Pharmacy (with phone number or street name): [facility]    Agent: Please be advised that RX refills may take up to 3 business days. We ask that you follow-up with your pharmacy.

## 2017-08-04 NOTE — Telephone Encounter (Signed)
WHAT MEDICATION>??

## 2017-08-04 NOTE — Telephone Encounter (Signed)
All medication that we fill.

## 2017-08-04 NOTE — Telephone Encounter (Signed)
Please advise 

## 2017-08-09 ENCOUNTER — Other Ambulatory Visit: Payer: Self-pay

## 2017-08-09 MED ORDER — VENLAFAXINE HCL ER 150 MG PO CP24: ORAL_CAPSULE | ORAL | 0 refills | Status: DC

## 2017-08-18 ENCOUNTER — Telehealth: Payer: Self-pay

## 2017-08-18 NOTE — Telephone Encounter (Signed)
PA for effexor has been approved from 07/10/2017 - 07/10/2018.

## 2017-08-28 ENCOUNTER — Other Ambulatory Visit: Payer: Self-pay | Admitting: Internal Medicine

## 2017-09-07 ENCOUNTER — Other Ambulatory Visit: Payer: Medicare Other

## 2017-09-08 ENCOUNTER — Other Ambulatory Visit (INDEPENDENT_AMBULATORY_CARE_PROVIDER_SITE_OTHER): Payer: Medicare Other

## 2017-09-08 DIAGNOSIS — E119 Type 2 diabetes mellitus without complications: Secondary | ICD-10-CM

## 2017-09-08 LAB — MICROALBUMIN / CREATININE URINE RATIO
CREATININE, U: 148.6 mg/dL
MICROALB/CREAT RATIO: 0.6 mg/g (ref 0.0–30.0)
Microalb, Ur: 0.9 mg/dL (ref 0.0–1.9)

## 2017-09-08 LAB — COMPREHENSIVE METABOLIC PANEL
ALT: 26 U/L (ref 0–35)
AST: 24 U/L (ref 0–37)
Albumin: 3.9 g/dL (ref 3.5–5.2)
Alkaline Phosphatase: 44 U/L (ref 39–117)
BILIRUBIN TOTAL: 0.3 mg/dL (ref 0.2–1.2)
BUN: 16 mg/dL (ref 6–23)
CO2: 28 meq/L (ref 19–32)
Calcium: 9.6 mg/dL (ref 8.4–10.5)
Chloride: 106 mEq/L (ref 96–112)
Creatinine, Ser: 1.06 mg/dL (ref 0.40–1.20)
GFR: 54.59 mL/min — AB (ref >60.00)
GLUCOSE: 113 mg/dL — AB (ref 70–99)
Potassium: 4.1 mEq/L (ref 3.5–5.1)
SODIUM: 141 meq/L (ref 135–145)
Total Protein: 7.2 g/dL (ref 6.0–8.3)

## 2017-09-08 LAB — POCT GLYCOSYLATED HEMOGLOBIN (HGB A1C): Hemoglobin A1C: 6.5

## 2017-09-08 LAB — LIPID PANEL
CHOL/HDL RATIO: 3
Cholesterol: 144 mg/dL (ref 0–200)
HDL: 44.5 mg/dL (ref >39.00)
LDL Cholesterol: 77 mg/dL (ref 0–99)
NONHDL: 99.96
Triglycerides: 113 mg/dL (ref 0.0–149.0)
VLDL: 22.6 mg/dL (ref 0.0–40.0)

## 2017-09-08 NOTE — Addendum Note (Signed)
Addended by: Arby Barrette on: 09/08/2017 09:00 AM   Modules accepted: Orders

## 2017-09-12 ENCOUNTER — Encounter: Payer: Self-pay | Admitting: Internal Medicine

## 2017-09-12 ENCOUNTER — Ambulatory Visit (INDEPENDENT_AMBULATORY_CARE_PROVIDER_SITE_OTHER): Payer: Medicare Other | Admitting: Internal Medicine

## 2017-09-12 VITALS — BP 110/54 | HR 98 | Temp 98.0°F | Resp 15 | Ht 66.0 in | Wt 197.6 lb

## 2017-09-12 DIAGNOSIS — I872 Venous insufficiency (chronic) (peripheral): Secondary | ICD-10-CM

## 2017-09-12 DIAGNOSIS — E119 Type 2 diabetes mellitus without complications: Secondary | ICD-10-CM | POA: Diagnosis not present

## 2017-09-12 DIAGNOSIS — R5383 Other fatigue: Secondary | ICD-10-CM | POA: Diagnosis not present

## 2017-09-12 DIAGNOSIS — G2581 Restless legs syndrome: Secondary | ICD-10-CM | POA: Diagnosis not present

## 2017-09-12 DIAGNOSIS — Z720 Tobacco use: Secondary | ICD-10-CM

## 2017-09-12 DIAGNOSIS — M7631 Iliotibial band syndrome, right leg: Secondary | ICD-10-CM

## 2017-09-12 DIAGNOSIS — I10 Essential (primary) hypertension: Secondary | ICD-10-CM | POA: Diagnosis not present

## 2017-09-12 MED ORDER — GLUCOSE BLOOD VI STRP: ORAL_STRIP | 2 refills | Status: DC

## 2017-09-12 MED ORDER — ONETOUCH DELICA LANCETS FINE MISC: 1.0000 "application " | Freq: Every day | 2 refills | Status: DC

## 2017-09-12 NOTE — Patient Instructions (Addendum)
Your diabetes remains under excellent control currently. .Please return in 6 months for follow up on diabetes and make sure you are seeing your eye doctor at least once a year.   Crystal [email or web address]  Has a great variety of compression stockings,  Knee highs etc.     Iliotibial Bursitis Rehab Ask your health care provider which exercises are safe for you. Do exercises exactly as told by your health care provider and adjust them as directed. It is normal to feel mild stretching, pulling, tightness, or discomfort as you do these exercises, but you should stop right away if you feel sudden pain or your pain gets worse.Do not begin these exercises until told by your health care provider. Stretching and range of motion exercises These exercises warm up your muscles and joints and improve the movement and flexibility of your leg. These exercises also help to relieve pain and stiffness. Exercise A: Quadriceps stretch, prone  1. Lie on your abdomen on a firm surface, such as a bed or padded floor. 2. Bend your left / right knee and hold your ankle. If you cannot reach your ankle or pant leg, loop a belt around your foot and grab the belt instead. 3. Gently pull your heel toward your buttocks. Your knee should not slide out to the side. You should feel a stretch in the front of your thigh and knee. 4. Hold this position for __________ seconds. Repeat __________ times. Complete this exercise __________ times a day. Exercise B: Lunge ( 1. adductor stretch) 2. .Stand and spread your legs about 3 feet (about 1 m) apart. Put your left / right leg slightly back for balance. 3. Lean away from your left / right leg by bending your other knee and shifting your weight toward your bent knee. You may rest your hands on your thigh for balance. You should feel a stretch in your left / right inner thigh. 4. Hold for __________ seconds. Repeat __________ times. Complete this exercise __________ times a day. Exercise C:  Hamstring stretch, supine  1. Lie on your back. 2. Hold both ends of a belt or towel as you loop it over the ball of your left / right foot. The ball of your foot is on the walking surface, right under your toes. 3. Straighten your left / right knee and slowly pull on the belt to raise your leg. Stop when you feel a gentle stretch in the back of your left / right knee or thigh. ? Do not let your left / right knee bend. ? Keep your other leg flat on the floor. 4. Hold this position for __________ seconds. Repeat __________ times. Complete this exercise __________ times a day. Strengthening exercises These exercises build strength and endurance in your leg. Endurance is the ability to use your muscles for a long time, even after they get tired. Exercise D: Quadriceps wall slides  1. Lean your back against a smooth wall or door while you walk your feet out 18-24 inches (46-61 cm) from it. 2. Place your feet hip-width apart. 3. Slowly slide down the wall or door until your knees bend as far as told by your health care provider. Keep your knees over your heels, not your toes. Keep your knees in line with your hips. 4. Hold for __________ seconds. 5. Push through your heels to stand up to rest for __________ seconds after each repetition. Repeat __________ times. Complete this exercise __________ times a day. Exercise E: Straight leg raises (  hip abductors) 1. Lie on your side, with your left / right leg in the top position. Lie so your head, shoulder, knee, and hip line up with each other. You may bend your bottom knee to help you balance. 2. Lift your top leg 4-6 inches (10-15 cm) while keeping your toes pointed straight ahead. 3. Hold this position for __________ seconds. 4. Slowly lower your leg to the starting position. Allow your muscles to relax completely after each repetition. Repeat __________ times. Complete this exercise __________ times a day. Exercise F: Straight leg raises ( hip  extensors) 1. Lie on your abdomen on a firm surface. You can put a pillow under your hips if that is more comfortable. 2. Tense the muscles in your buttocks and lift your left / right leg about 4-6 inches (10-15 cm). Keep your knee straight as you lift your leg. 3. Hold this position for __________ seconds. 4. Slowly lower your leg to the starting position. 5. Let your leg relax completely after each repetition. Repeat __________ times. Complete this exercise __________ times a day. Exercise G: Bridge ( hip extensors) 1. Lie on your back on a firm surface with your knees bent and your feet flat on the floor. 2. Tighten your buttocks muscles and lift your bottom off the floor until your trunk is level with your thighs. ? Do not arch your back. ? You should feel the muscles working in your buttocks and the back of your thighs. If you do not feel these muscles, slide your feet 1-2 inches (2.5-5 cm) farther away from your buttocks. 3. Hold this position for __________ seconds. 4. Slowly lower your hips to the starting position. 5. Let your buttocks muscles relax completely between repetitions. 6. If this exercise is too easy, try doing it with your arms crossed over your chest. Repeat __________ times. Complete this exercise __________ times a day. This information is not intended to replace advice given to you by your health care provider. Make sure you discuss any questions you have with your health care provider. Document Released: 06/27/2005 Document Revised: 03/03/2016 Document Reviewed: 06/09/2015 Elsevier Interactive Patient Education  [employer].

## 2017-09-12 NOTE — Progress Notes (Signed)
Subjective:  Patient ID: Crystal Meyers, female    DOB: 12/24/1945  Age: 72 y.o. MRN: [id number]  CC: The primary encounter diagnosis was Fatigue, unspecified type. Diagnoses of Chronic venous insufficiency, Diabetes mellitus without complication ([place]), Essential hypertension, Iliotibial band syndrome affecting right lower leg, Restless legs syndrome, and Tobacco abuse were also pertinent to this visit.  HPI [name] presents for follow up on type 2 DM , hyperlipidemia,  And hypertension.  Fasting labs were done prior to visit and were reviewed witih patient today  3 month follow up on diabetes.     Patient is following a low glycemic index diet and taking all prescribed medications regularly without side effects.  Fasting sugars have been under less than 140 most of the time and post prandials have been under 160 except on rare occasions. Patient is exercising about 3 times per week and intentionally trying to lose weight .  Patient has had an eye exam in the last 12 months and checks feet regularly for signs of infection.  Patient does not walk barefoot outside,  And denies an numbness tingling or burning in feet. Patient is up to date on all recommended vaccinations  Still having right lateral thigh pain that is intermittent.   severe  Does not involve the hip or the knee. Feels it is a  Strained tendon or muscle.   RLS symptoms every night .  Started ten years ago,  Now worse,  Occurring Late at night   Trigeminal neuralgia:  intermittent on right side of face.  Not sharp any more , but dull and occurring daily brought on by touching cheek or eating.     Outpatient Medications Prior to Visit  Medication Sig Dispense Refill  . atorvastatin (LIPITOR) 40 MG tablet Take 1 tablet (40 mg total) by mouth daily. 90 tablet 1  . levothyroxine (SYNTHROID, LEVOTHROID) 75 MCG tablet Take 1 tablet (75 mcg total) by mouth daily. 90 tablet 0  . lisinopril (PRINIVIL,ZESTRIL) 40 MG tablet Take 0.5  tablets (20 mg total) by mouth daily. 45 tablet 5  . nitroGLYCERIN (NITROSTAT) 0.4 MG SL tablet Place 1 tablet (0.4 mg total) under the tongue every 5 (five) minutes as needed for chest pain. 30 tablet 3  . omeprazole (PRILOSEC) 40 MG capsule Take 1 capsule (40 mg total) by mouth daily. 90 capsule 1  . venlafaxine XR (EFFEXOR-XR) 150 MG 24 hr capsule TAKE 1 CAPSULE BY MOUTH EVERY DAY 60 capsule 0  . atorvastatin (LIPITOR) 40 MG tablet TAKE 1 TABLET BY MOUTH EVERY DAY (Patient not taking: Reported on 09/12/2017) 90 tablet 1  . venlafaxine XR (EFFEXOR-XR) 150 MG 24 hr capsule TAKE 1 CAPSULE BY MOUTH EVERY DAY (Patient not taking: Reported on 09/12/2017) 90 capsule 1  . venlafaxine XR (EFFEXOR-XR) 75 MG 24 hr capsule Take 2 capsules (150 mg total) by mouth daily. (Patient not taking: Reported on 09/12/2017) 30 capsule 5   No facility-administered medications prior to visit.     Review of Systems;  Patient denies headache, fevers, malaise, unintentional weight loss, skin rash, eye pain, sinus congestion and sinus pain, sore throat, dysphagia,  hemoptysis , cough, dyspnea, wheezing, chest pain, palpitations, orthopnea, edema, abdominal pain, nausea, melena, diarrhea, constipation, flank pain, dysuria, hematuria, urinary  Frequency, nocturia, numbness, tingling, seizures,  Focal weakness, Loss of consciousness,  Tremor, insomnia, depression, anxiety, and suicidal ideation.      Objective:  BP (!) 110/54 (BP Location: Left Arm, Patient Position: Sitting, Cuff  Size: Normal)   Pulse 98   Temp 98 F (36.7 C) (Oral)   Resp 15   Ht 5\' 6"  (1.676 m)   Wt 197 lb 9.6 oz (89.6 kg)   SpO2 97%   BMI 31.89 kg/m   BP Readings from Last 3 Encounters:  09/12/17 (!) 110/54  07/21/17 120/68  06/09/17 118/74    Wt Readings from Last 3 Encounters:  09/12/17 197 lb 9.6 oz (89.6 kg)  07/21/17 194 lb 6.4 oz (88.2 kg)  06/09/17 191 lb 6.4 oz (86.8 kg)    General appearance: alert, cooperative and appears stated  age Ears: normal TM's and external ear canals both ears Throat: lips, mucosa, and tongue normal; teeth and gums normal Neck: no adenopathy, no carotid bruit, supple, symmetrical, trachea midline and thyroid not enlarged, symmetric, no tenderness/mass/nodules Back: symmetric, no curvature. ROM normal. No CVA tenderness. Lungs: clear to auscultation bilaterally Heart: regular rate and rhythm, S1, S2 normal, no murmur, click, rub or gallop Abdomen: soft, non-tender; bowel sounds normal; no masses,  no organomegaly Pulses: 2+ and symmetric Skin: Skin color, texture, turgor normal. No rashes or lesions Lymph nodes: Cervical, supraclavicular, and axillary nodes normal.  Lab Results  Component Value Date   HGBA1C 6.5 09/08/2017   HGBA1C 6.5 06/09/2017   HGBA1C 6.4 03/08/2017    Lab Results  Component Value Date   CREATININE 1.06 09/08/2017   CREATININE 1.01 03/10/2017   CREATININE 1.12 12/06/2016    Lab Results  Component Value Date   WBC 6.1 04/14/2017   HGB 12.4 04/14/2017   HCT 35.2 04/14/2017   PLT 218 04/14/2017   GLUCOSE 113 (H) 09/08/2017   CHOL 144 09/08/2017   TRIG 113.0 09/08/2017   HDL 44.50 09/08/2017   LDLDIRECT 71.0 08/31/2016   LDLCALC 77 09/08/2017   ALT 26 09/08/2017   AST 24 09/08/2017   NA 141 09/08/2017   K 4.1 09/08/2017   CL 106 09/08/2017   CREATININE 1.06 09/08/2017   BUN 16 09/08/2017   CO2 28 09/08/2017   TSH 2.71 08/31/2016   HGBA1C 6.5 09/08/2017   MICROALBUR 0.9 09/08/2017    Ct Chest Lung Cancer Screening Low Dose Wo Contrast  Result Date: 06/05/2017 CLINICAL DATA:  Seventy eighty pack-year smoking history. Current smoker. Breast cancer with bilateral mastectomies and breast implants 8 years ago. EXAM: CT CHEST WITHOUT CONTRAST LOW-DOSE FOR LUNG CANCER SCREENING TECHNIQUE: Multidetector CT imaging of the chest was performed following the standard protocol without IV contrast. COMPARISON:  Chest radiograph 03/08/2017.  Chest CT 05/24/2016.  FINDINGS: Cardiovascular: Aortic atherosclerosis. Normal heart size, without pericardial effusion. Multivessel coronary artery atherosclerosis. Mediastinum/Nodes: No mediastinal or definite hilar adenopathy, given limitations of unenhanced CT. No axillary adenopathy. No internal mammary adenopathy. Lungs/Pleura: No pleural fluid. Calcified and noncalcified pulmonary nodules are not significantly changed. No suspicious or enlarging nodules. Upper Abdomen: Mild hepatic steatosis. Normal imaged portions of the spleen, stomach, pancreas, gallbladder, adrenal glands, kidneys. Musculoskeletal: Bilateral breast implants. Mild thoracic spondylosis. IMPRESSION: 1. Lung-RADS 2, benign appearance or behavior. Continue annual screening with low-dose chest CT without contrast in 12 months. 2. Coronary artery atherosclerosis. Aortic Atherosclerosis (ICD10-I70.0). 3. Mild hepatic steatosis. Electronically Signed   By: Abigail Miyamoto M.D.   On: 06/05/2017 13:15    Assessment & Plan:   Problem List Items Addressed This Visit    Chronic venous insufficiency    Recommended daily use of compression stockings       Diabetes mellitus without complication ([place])  Currently well-controlled on current medications .  hemoglobin A1c is at goal of less than 7.0 . Patient is reminded to schedule an annual eye exam and foot exam is normal today. Patient has no microalbuminuria. Patient is tolerating statin therapy for CAD risk reduction and on ACE/ARB for renal protection and hypertension.  Lab Results  Component Value Date   HGBA1C 6.5 09/08/2017   Lab Results  Component Value Date   MICROALBUR 0.9 09/08/2017         Essential hypertension    Well controlled on current regimen. Renal function stable, no changes today.  Lab Results  Component Value Date   CREATININE 1.06 09/08/2017   Lab Results  Component Value Date   NA 141 09/08/2017   K 4.1 09/08/2017   CL 106 09/08/2017   CO2 28 09/08/2017          Iliotibial band syndrome affecting right lower leg    Stretching exercises given.       Restless legs syndrome    Screening for iron deficiency is negative.  Will start requip  Lab Results  Component Value Date   WBC 6.1 04/14/2017   HGB 12.4 04/14/2017   HCT 35.2 04/14/2017   MCV 86.6 04/14/2017   PLT 218 04/14/2017    Lab Results  Component Value Date   IRON 67 09/12/2017   TIBC 336 09/12/2017   FERRITIN 25 09/12/2017         Tobacco abuse    Urged to quit.  Smoking.  Prior intolerance to chantix and wellbutrin.        Other Visit Diagnoses    Fatigue, unspecified type    -  Primary   Relevant Orders   Iron, TIBC and Ferritin Panel (Completed)     .A total of 25 minutes of face to face time was spent with patient more than half of which was spent in counselling about the above mentioned conditions  and coordination of care   I am having Dasie D. Bare start on glucose blood and ONETOUCH DELICA LANCETS FINE. I am also having her maintain her atorvastatin, levothyroxine, lisinopril, omeprazole, nitroGLYCERIN, and venlafaxine XR.  Meds ordered this encounter  Medications  . glucose blood test strip    Sig: Use to check blood sugar once daily    Dispense:  100 each    Refill:  2  . ONETOUCH DELICA LANCETS FINE MISC    Sig: 1 application by Does not apply route daily.    Dispense:  100 each    Refill:  2    Medications Discontinued During This Encounter  Medication Reason  . atorvastatin (LIPITOR) 40 MG tablet Duplicate  . venlafaxine XR (EFFEXOR-XR) 150 MG 24 hr capsule Patient has not taken in last 30 days  . venlafaxine XR (EFFEXOR-XR) 75 MG 24 hr capsule Duplicate    Follow-up: Return in about 6 months (around 03/15/2018) for follow up diabetes.   [name], MD

## 2017-09-13 ENCOUNTER — Other Ambulatory Visit: Payer: Self-pay | Admitting: Internal Medicine

## 2017-09-13 LAB — IRON,TIBC AND FERRITIN PANEL
%SAT: 20 % (ref 11–50)
FERRITIN: 25 ng/mL (ref 20–288)
Iron: 67 ug/dL (ref 45–160)
TIBC: 336 mcg/dL (calc) (ref 250–450)

## 2017-09-13 MED ORDER — ROPINIROLE HCL 0.25 MG PO TABS: 0.2500 mg | ORAL_TABLET | Freq: Every day | ORAL | 0 refills | Status: DC

## 2017-09-14 ENCOUNTER — Encounter: Payer: Self-pay | Admitting: Internal Medicine

## 2017-09-14 DIAGNOSIS — M7631 Iliotibial band syndrome, right leg: Secondary | ICD-10-CM | POA: Insufficient documentation

## 2017-09-14 DIAGNOSIS — G2581 Restless legs syndrome: Secondary | ICD-10-CM | POA: Insufficient documentation

## 2017-09-14 NOTE — Assessment & Plan Note (Signed)
Urged to quit.  Smoking.  Prior intolerance to chantix and wellbutrin.

## 2017-09-14 NOTE — Assessment & Plan Note (Addendum)
Currently well-controlled on current medications .  hemoglobin A1c is at goal of less than 7.0 . Patient is reminded to schedule an annual eye exam and foot exam is normal today. Patient has no microalbuminuria. Patient is tolerating statin therapy for CAD risk reduction and on ACE/ARB for renal protection and hypertension.  Lab Results  Component Value Date   HGBA1C 6.5 09/08/2017   Lab Results  Component Value Date   MICROALBUR 0.9 09/08/2017

## 2017-09-14 NOTE — Assessment & Plan Note (Signed)
Screening for iron deficiency is negative.  Will start requip  Lab Results  Component Value Date   WBC 6.1 04/14/2017   HGB 12.4 04/14/2017   HCT 35.2 04/14/2017   MCV 86.6 04/14/2017   PLT 218 04/14/2017    Lab Results  Component Value Date   IRON 67 09/12/2017   TIBC 336 09/12/2017   FERRITIN 25 09/12/2017

## 2017-09-14 NOTE — Assessment & Plan Note (Signed)
Stretching exercises given.

## 2017-09-14 NOTE — Assessment & Plan Note (Signed)
Well controlled on current regimen. Renal function stable, no changes today.  Lab Results  Component Value Date   CREATININE 1.06 09/08/2017   Lab Results  Component Value Date   NA 141 09/08/2017   K 4.1 09/08/2017   CL 106 09/08/2017   CO2 28 09/08/2017

## 2017-09-14 NOTE — Assessment & Plan Note (Signed)
Recommended daily use of compression stockings

## 2017-09-19 ENCOUNTER — Other Ambulatory Visit: Payer: Self-pay | Admitting: Internal Medicine

## 2017-09-27 ENCOUNTER — Other Ambulatory Visit: Payer: Self-pay

## 2017-09-27 MED ORDER — VENLAFAXINE HCL ER 150 MG PO CP24: ORAL_CAPSULE | ORAL | 1 refills | Status: DC

## 2017-10-18 ENCOUNTER — Telehealth: Payer: Self-pay | Admitting: Internal Medicine

## 2017-10-18 MED ORDER — LEVOTHYROXINE SODIUM 75 MCG PO TABS: 75.0000 ug | ORAL_TABLET | Freq: Every day | ORAL | 0 refills | Status: DC

## 2017-10-18 MED ORDER — VENLAFAXINE HCL ER 150 MG PO CP24: ORAL_CAPSULE | ORAL | 1 refills | Status: DC

## 2017-10-18 MED ORDER — OMEPRAZOLE 40 MG PO CPDR
40.0000 mg | DELAYED_RELEASE_CAPSULE | Freq: Every day | ORAL | 1 refills | Status: DC
Start: 2017-10-18 — End: 2018-04-13

## 2017-10-18 MED ORDER — ATORVASTATIN CALCIUM 40 MG PO TABS: 40.0000 mg | ORAL_TABLET | Freq: Every day | ORAL | 1 refills | Status: DC

## 2017-10-18 MED ORDER — LISINOPRIL 40 MG PO TABS: 20.0000 mg | ORAL_TABLET | Freq: Every day | ORAL | 5 refills | Status: DC

## 2017-10-18 NOTE — Telephone Encounter (Signed)
I refilled everything but the synthroid no TSH since 08/31/16 ok to fill?

## 2017-10-18 NOTE — Telephone Encounter (Signed)
Yes, we will check it with next follow up.  90 day refill sent to [facility]

## 2017-10-18 NOTE — Telephone Encounter (Signed)
Copied from [place] [id number]. Topic: Quick Communication - Rx Refill/Question >> Oct 18, 2017  9:45 AM Cleaster Corin, NT wrote: Medication:venlafaxine XR (EFFEXOR-XR) 150 MG 24 hr capsule [[id number]]  lisinopril (PRINIVIL,ZESTRIL) 40 MG tablet [[id number]]  levothyroxine (SYNTHROID, LEVOTHROID) 75 MCG tablet [[id number]]  omeprazole (PRILOSEC) 40 MG capsule [[id number]] atorvastatin (LIPITOR) 40 MG tablet [[id number]]  Has the patient contacted their pharmacy? no (Agent: If no, request that the patient contact the pharmacy for the refill.) Preferred Pharmacy (with phone number or street name): [facility], [place] [address] [place] [address] Phone: [phone] Fax: [phone]   Agent: Please be advised that RX refills may take up to 3 business days. We ask that you follow-up with your pharmacy.

## 2017-10-18 NOTE — Addendum Note (Signed)
Addended by: [name] on: 10/18/2017 12:15 PM   Modules accepted: Orders

## 2017-10-18 NOTE — Telephone Encounter (Signed)
Effexor-XR 150 mg 24 hr capsule Lisinopril 40 mg tablet Synthroid 75 mcg tablet Prilosec 40 mg capsule Lipitor 40 mg tablet      Refill requests  LOV 09/12/17 with Dr. Derrel Nip  For some reason this screen came up differently not allowing me to approve these medications.  Sorry I had to send to you.  [place], [place].  [id number] Phone:  [phone] Fax:  [phone]

## 2017-10-18 NOTE — Addendum Note (Signed)
Addended by: Nanci Pina on: 10/18/2017 10:43 AM   Modules accepted: Orders

## 2017-10-23 ENCOUNTER — Other Ambulatory Visit: Payer: Self-pay | Admitting: Internal Medicine

## 2017-10-23 MED ORDER — LEVOTHYROXINE SODIUM 75 MCG PO TABS: 75.0000 ug | ORAL_TABLET | Freq: Every day | ORAL | 0 refills | Status: DC

## 2017-10-23 MED ORDER — VENLAFAXINE HCL ER 150 MG PO CP24: ORAL_CAPSULE | ORAL | 0 refills | Status: DC

## 2017-10-23 NOTE — Addendum Note (Signed)
Addended by: Matilde Sprang on: 10/23/2017 09:58 AM   Modules accepted: Orders

## 2017-10-23 NOTE — Telephone Encounter (Signed)
Pt states she spoke mail order pharmacy and since it will be a while untile she can get her medications, they asked her to ask her pcp for 30 day supplies of the  Effexor and Syntroid to be [facility] to [facility] [place], contact pt if needed

## 2017-10-27 ENCOUNTER — Other Ambulatory Visit: Payer: Self-pay | Admitting: Internal Medicine

## 2017-12-07 ENCOUNTER — Ambulatory Visit: Payer: Medicare Other

## 2018-01-08 ENCOUNTER — Telehealth: Payer: Self-pay | Admitting: Internal Medicine

## 2018-01-08 NOTE — Telephone Encounter (Signed)
Copied from [place] [id number]. Topic: Quick Communication - Rx Refill/Question >> Jan 08, 2018  3:13 PM Synthia Innocent wrote: Medication: levothyroxine (SYNTHROID, LEVOTHROID) 75 MCG tablet  Has the patient contacted their pharmacy? Yes.   (Agent: If no, request that the patient contact the pharmacy for the refill.) (Agent: If yes, when and what did the pharmacy advise?)  Preferred Pharmacy (with phone number or street name): [facility]  Agent: Please be advised that RX refills may take up to 3 business days. We ask that you follow-up with your pharmacy.  Ref# [id number]

## 2018-01-09 ENCOUNTER — Other Ambulatory Visit: Payer: Self-pay

## 2018-01-09 MED ORDER — LEVOTHYROXINE SODIUM 75 MCG PO TABS: 75.0000 ug | ORAL_TABLET | Freq: Every day | ORAL | 1 refills | Status: DC

## 2018-02-09 DIAGNOSIS — H02831 Dermatochalasis of right upper eyelid: Secondary | ICD-10-CM | POA: Diagnosis not present

## 2018-02-09 DIAGNOSIS — H02834 Dermatochalasis of left upper eyelid: Secondary | ICD-10-CM | POA: Diagnosis not present

## 2018-02-09 DIAGNOSIS — H02832 Dermatochalasis of right lower eyelid: Secondary | ICD-10-CM | POA: Diagnosis not present

## 2018-02-09 DIAGNOSIS — H02835 Dermatochalasis of left lower eyelid: Secondary | ICD-10-CM | POA: Diagnosis not present

## 2018-03-15 ENCOUNTER — Ambulatory Visit (INDEPENDENT_AMBULATORY_CARE_PROVIDER_SITE_OTHER): Payer: Medicare Other

## 2018-03-15 ENCOUNTER — Ambulatory Visit (INDEPENDENT_AMBULATORY_CARE_PROVIDER_SITE_OTHER): Payer: Medicare Other | Admitting: Internal Medicine

## 2018-03-15 ENCOUNTER — Encounter: Payer: Self-pay | Admitting: Internal Medicine

## 2018-03-15 VITALS — BP 120/76 | HR 86 | Temp 98.4°F | Resp 15 | Ht 66.0 in | Wt 195.0 lb

## 2018-03-15 VITALS — BP 120/76 | HR 86 | Temp 98.4°F | Resp 15 | Ht 66.0 in | Wt 195.4 lb

## 2018-03-15 DIAGNOSIS — Z23 Encounter for immunization: Secondary | ICD-10-CM

## 2018-03-15 DIAGNOSIS — M7989 Other specified soft tissue disorders: Secondary | ICD-10-CM

## 2018-03-15 DIAGNOSIS — R600 Localized edema: Secondary | ICD-10-CM | POA: Diagnosis not present

## 2018-03-15 DIAGNOSIS — E785 Hyperlipidemia, unspecified: Secondary | ICD-10-CM

## 2018-03-15 DIAGNOSIS — E034 Atrophy of thyroid (acquired): Secondary | ICD-10-CM | POA: Diagnosis not present

## 2018-03-15 DIAGNOSIS — Z0000 Encounter for general adult medical examination without abnormal findings: Secondary | ICD-10-CM

## 2018-03-15 DIAGNOSIS — E119 Type 2 diabetes mellitus without complications: Secondary | ICD-10-CM | POA: Diagnosis not present

## 2018-03-15 DIAGNOSIS — Z716 Tobacco abuse counseling: Secondary | ICD-10-CM | POA: Diagnosis not present

## 2018-03-15 LAB — LIPID PANEL
CHOL/HDL RATIO: 3
Cholesterol: 132 mg/dL (ref 0–200)
HDL: 38.8 mg/dL — AB (ref >39.00)
LDL CALC: 66 mg/dL (ref 0–99)
NonHDL: 92.74
TRIGLYCERIDES: 134 mg/dL (ref 0.0–149.0)
VLDL: 26.8 mg/dL (ref 0.0–40.0)

## 2018-03-15 LAB — POCT GLYCOSYLATED HEMOGLOBIN (HGB A1C): HEMOGLOBIN A1C: 6.6 % — AB (ref 4.0–5.6)

## 2018-03-15 LAB — COMPREHENSIVE METABOLIC PANEL
ALK PHOS: 45 U/L (ref 39–117)
ALT: 34 U/L (ref 0–35)
AST: 30 U/L (ref 0–37)
Albumin: 4.1 g/dL (ref 3.5–5.2)
BUN: 12 mg/dL (ref 6–23)
CO2: 30 meq/L (ref 19–32)
Calcium: 9.5 mg/dL (ref 8.4–10.5)
Chloride: 105 mEq/L (ref 96–112)
Creatinine, Ser: 1.01 mg/dL (ref 0.40–1.20)
GFR: 57.63 mL/min — ABNORMAL LOW (ref >60.00)
Glucose, Bld: 116 mg/dL — ABNORMAL HIGH (ref 70–99)
POTASSIUM: 4.3 meq/L (ref 3.5–5.1)
SODIUM: 140 meq/L (ref 135–145)
TOTAL PROTEIN: 6.9 g/dL (ref 6.0–8.3)
Total Bilirubin: 0.3 mg/dL (ref 0.2–1.2)

## 2018-03-15 LAB — TSH: TSH: 3.09 u[IU]/mL (ref 0.35–4.50)

## 2018-03-15 MED ORDER — GLUCOSE BLOOD VI STRP: ORAL_STRIP | 3 refills | Status: DC

## 2018-03-15 MED ORDER — TETANUS-DIPHTH-ACELL PERTUSSIS 5-2.5-18.5 LF-MCG/0.5 IM SUSP: 0.5000 mL | Freq: Once | INTRAMUSCULAR | 0 refills | Status: AC

## 2018-03-15 NOTE — Patient Instructions (Addendum)
I have given you a new glucometer and sent zn rx for test strips to your mail order  The TDap is $110 out of pocket since medicare will not pay for it,    Most pharmacies will offer it as well for far less  [employer] supposedly offers it for $30 to 60 as the Health Dept.) with the script I have given you today   Your next Lung Cancer screening CT is due in November   PLEASE [place]! TRY REDUCING YOUR DAILY NUMBER BY 1 EACH WEEK,  IN 20 WEEKS YOU WILL BE QUIT   LEFT LEG ULTRASOUND ORDERED TO RULE OUT DVT

## 2018-03-15 NOTE — Patient Instructions (Addendum)
  Crystal Meyers , Thank you for taking time to come for your Medicare Wellness Visit. I appreciate your ongoing commitment to your health goals. Please review the following plan we discussed and let me know if I can assist you in the future.   These are the goals we discussed: Goals    . Quit Smoking     Decrease the amount of cigarettes smoked by 1 per day       This is a list of the screening recommended for you and due dates:  Health Maintenance  Topic Date Due  . Tetanus Vaccine  06/08/1967  . Eye exam for diabetics  07/06/2018  . Hemoglobin A1C  09/13/2018  . Complete foot exam   03/16/2019  . Colon Cancer Screening  09/10/2024  . Flu Shot  Completed  . DEXA scan (bone density measurement)  Completed  .  Hepatitis C: One time screening is recommended by Center for Disease Control  (CDC) for  adults born from [birth year] through 1963.   Completed  . Pneumonia vaccines  Completed

## 2018-03-15 NOTE — Progress Notes (Addendum)
Subjective:   Crystal Meyers is a 72 y.o. female who presents for Medicare Annual (Subsequent) preventive examination.  Review of Systems:  No ROS.  Medicare Wellness Visit. Additional risk factors are reflected in the social history.  Cardiac Risk Factors include: advanced age (>50men, >49 women);hypertension;diabetes mellitus;obesity (BMI >30kg/m2);smoking/ tobacco exposure     Objective:     Vitals: BP 120/76 (BP Location: Left Arm, Patient Position: Sitting, Cuff Size: Normal)   Pulse 86   Temp 98.4 F (36.9 C) (Oral)   Resp 15   Ht 5\' 6"  (1.676 m)   Wt 195 lb (88.5 kg)   SpO2 96%   BMI 31.47 kg/m   Body mass index is 31.47 kg/m.  Advanced Directives 03/15/2018 04/14/2017 12/29/2016 12/06/2016 01/21/2016 01/13/2016 10/01/2015  Does Patient Have a Medical Advance Directive? Yes Yes Yes Yes Yes No No  Type of Paramedic of [place];Living will [place];Living will Living will;Healthcare Power of [place];Living will [place] - -  Does patient want to make changes to medical advance directive? No - Patient declined No - Patient declined - No - Patient declined No - Patient declined - -  Copy of [place] in Chart? Yes No - copy requested - Yes Yes - -  Would patient like information on creating a medical advance directive? - - - - No - patient declined information Yes - Educational materials given -    Tobacco Social History   Tobacco Use  Smoking Status Current Every Day Smoker  . Packs/day: 1.00  . Years: 54.00  . Pack years: 54.00  . Types: Cigarettes  Smokeless Tobacco Never Used  Tobacco Comment   Refused cessation material     Ready to quit: Not Answered Counseling given: Not Answered Comment: Refused cessation material   Clinical Intake:  Pre-visit preparation completed: Yes  Pain : No/denies pain     Nutritional Status: BMI > 30   Obese Diabetes: Yes(Followed by pcp)  How often do you need to have someone help you when you read instructions, pamphlets, or other written materials from your doctor or pharmacy?: 1 - Never  Interpreter Needed?: No     Past Medical History:  Diagnosis Date  . Barrett's esophagus   . Breast cancer ([place]) 7/10   left , invasive lobular carcinoma  . Complication of anesthesia    difficulty waking up and sleep apnea, low sats  . COPD (chronic obstructive pulmonary disease) ([place])   . Coronary artery disease   . Depression   . GERD (gastroesophageal reflux disease)   . Hypercholesteremia   . Hypertension   . Hypothyroidism   . Personal history of tobacco use, presenting hazards to health 05/06/2015  . Personal history of tobacco use, presenting hazards to health 05/06/2015  . Pneumonia    hx of  . PVC (premature ventricular contraction)   . Sleep apnea    wears CPAP nightly  . Trigeminal neuralgia    Past Surgical History:  Procedure Laterality Date  . BREAST SURGERY Bilateral 2011   dbl mastectomy  . COLONOSCOPY W/ POLYPECTOMY    . CRANIECTOMY Right 01/21/2016   Procedure: Microvascular Decompression - right trigemminal nerve;  Surgeon: [name], MD;  Location: [facility];  Service: Neurosurgery;  Laterality: Right;  . CRANIOTOMY Right 02/03/2016   Procedure: REPAIR OF CEREBROSPINAL FLUID LEAK AND HARVEST ABDOMINAL FAT GRAFT;  Surgeon: [name], MD;  Location: [facility];  Service: Neurosurgery;  Laterality: Right;  . LUMBAR LAMINECTOMY/DECOMPRESSION MICRODISCECTOMY Left 08/11/2015   Procedure: Left Lumbar four-five microdiscectomy;  Surgeon: [name], MD;  Location: [facility];  Service: Neurosurgery;  Laterality: Left;  [name] MASTECTOMY  05/2009   bilateral, reconstructive surgery 09/2009  . MASTECTOMY     Bilateral    Family History  Problem Relation Age of Onset  . Mental illness Mother   . Diabetes Mother   . Hypertension Mother   .  Hyperlipidemia Mother   . Alzheimer's disease Mother   . Hypertension Father   . Hyperlipidemia Father   . Stroke Father   . Heart attack Father   . Diabetes Father   . Deep vein thrombosis Father   . Colon cancer Father   . Cancer Brother        Lung  . Diabetes Brother   . Diabetes Brother   . Stroke Maternal Grandmother    Social History   Socioeconomic History  . Marital status: Widowed    Spouse name: Not on file  . Number of children: Not on file  . Years of education: Not on file  . Highest education level: Not on file  Occupational History  . Not on file  Social Needs  . Financial resource strain: Not hard at all  . Food insecurity:    Worry: Never true    Inability: Never true  . Transportation needs:    Medical: No    Non-medical: No  Tobacco Use  . Smoking status: Current Every Day Smoker    Packs/day: 1.00    Years: 54.00    Pack years: 54.00    Types: Cigarettes  . Smokeless tobacco: Never Used  . Tobacco comment: Refused cessation material  Substance and Sexual Activity  . Alcohol use: No  . Drug use: No  . Sexual activity: Never  Lifestyle  . Physical activity:    Days per week: 0 days    Minutes per session: Not on file  . Stress: Not on file  Relationships  . Social connections:    Talks on phone: Not on file    Gets together: Not on file    Attends religious service: Not on file    Active member of club or organization: Not on file    Attends meetings of clubs or organizations: Not on file    Relationship status: Not on file  Other Topics Concern  . Not on file  Social History Narrative   Lives with mother.   Recently widowed.   Always uses seat belts   Has a bird and a dog.   No exercise.    Outpatient Encounter Medications as of 03/15/2018  Medication Sig  . atorvastatin (LIPITOR) 40 MG tablet Take 1 tablet (40 mg total) by mouth daily.  [name] levothyroxine (SYNTHROID, LEVOTHROID) 75 MCG tablet Take 1 tablet (75 mcg total) by mouth  daily.  [name] lisinopril (PRINIVIL,ZESTRIL) 40 MG tablet Take 0.5 tablets (20 mg total) by mouth daily.  . nitroGLYCERIN (NITROSTAT) 0.4 MG SL tablet Place 1 tablet (0.4 mg total) under the tongue every 5 (five) minutes as needed for chest pain.  [name] omeprazole (PRILOSEC) 40 MG capsule TAKE 1 CAPSULE BY MOUTH DAILY  . omeprazole (PRILOSEC) 40 MG capsule Take 1 capsule (40 mg total) by mouth daily.  Glory Rosebush DELICA LANCETS FINE MISC 1 application by Does not apply route daily. (Patient not taking: Reported on 03/15/2018)  . rOPINIRole (REQUIP) 0.25 MG tablet  TAKE 1 TABLET (0.25 MG TOTAL) BY MOUTH AT BEDTIME. MAY INCREASE WEEKLY AS NEEDED FOR RLS  . venlafaxine XR (EFFEXOR-XR) 150 MG 24 hr capsule TAKE 1 CAPSULE BY MOUTH EVERY DAY   No facility-administered encounter medications on file as of 03/15/2018.     Activities of Daily Living In your present state of health, do you have any difficulty performing the following activities: 03/15/2018  Hearing? N  Vision? N  Difficulty concentrating or making decisions? N  Walking or climbing stairs? Y  Comment SOBOE  Dressing or bathing? N  Doing errands, shopping? N  Preparing Food and eating ? N  Using the Toilet? N  In the past six months, have you accidently leaked urine? N  Do you have problems with loss of bowel control? N  Managing your Medications? N  Managing your Finances? N  Housekeeping or managing your Housekeeping? N  Some recent data might be hidden    Patient Care Team: [name], MD as PCP - General (Internal Medicine) [name], MD (Unknown Physician Specialty)    Assessment:   This is a routine wellness examination for Deyna. The goal of the wellness visit is to assist the patient how to close the gaps in care and create a preventative care plan for the patient.   The roster of all physicians providing medical care to patient is listed in the Snapshot section of the chart.  Osteoporosis risk reviewed.    Safety  issues reviewed; Smoke and carbon monoxide detectors in the home. No firearms in the home. Wears seatbelts when driving or riding with others. No violence in the home.  They do not have excessive sun exposure.  Discussed the need for sun protection: hats, long sleeves and the use of sunscreen if there is significant sun exposure.  Patient is alert, normal appearance, oriented to person/place/and time.  Correctly identified the president of the [place] and recalls of 3/3 words. Performs simple calculations and can read correct time from watch face.  Displays appropriate judgement.  No new identified risk were noted.  No failures at ADL's or IADL's.    BMI- discussed the importance of a healthy diet, water intake and the benefits of aerobic exercise. Educational material provided.   24 hour diet recall: Regular diet   Dental- every 6 months.  Sleep patterns- Sleeps without issues.   Labs completed.   Patient Concerns: None at this time. Follow up with PCP as needed.  Exercise Activities and Dietary recommendations Current Exercise Habits: Home exercise routine, Type of exercise: walking, Time (Minutes): 15, Frequency (Times/Week): 1, Weekly Exercise (Minutes/Week): 15, Intensity: Mild  Goals    . Quit Smoking     Decrease the amount of cigarettes smoked by 1 per day       Fall Risk Fall Risk  03/15/2018 12/06/2016 09/05/2016 02/09/2015 11/25/2013  Falls in the past year? Yes No No No No  Number falls in past yr: 1 - - - -  Injury with Fall? No - - - -  Depression Screen PHQ 2/9 Scores 03/15/2018 12/06/2016 09/05/2016 02/09/2015  PHQ - 2 Score 0 0 2 0     Cognitive Function     6CIT Screen 03/15/2018 12/06/2016  What Year? 0 points 0 points  What month? 0 points 0 points  What time? 0 points 0 points  Count back from 20 0 points 0 points  Months in reverse 0 points 0 points  Repeat phrase 0 points 0 points  Total Score  0 0    Immunization History  Administered Date(s)  Administered  . Influenza, High Dose Seasonal PF 06/09/2017, 03/15/2018  . Influenza,inj,Quad PF,6+ Mos 09/05/2016  . Pneumococcal Conjugate-13 11/25/2013  . Pneumococcal Polysaccharide-23 11/26/2006, 07/03/2015   Screening Tests Health Maintenance  Topic Date Due  . TETANUS/TDAP  06/08/1967  . OPHTHALMOLOGY EXAM  07/06/2018  . HEMOGLOBIN A1C  09/13/2018  . FOOT EXAM  03/16/2019  . COLONOSCOPY  09/10/2024  . INFLUENZA VACCINE  Completed  . DEXA SCAN  Completed  . Hepatitis C Screening  Completed  . PNA vac Low Risk Adult  Completed      Plan:    End of life planning; Advance aging; Advanced directives discussed. Copy of current HCPOA/Living Will on file.    I have personally reviewed and noted the following in the patient's chart:   . Medical and social history . Use of alcohol, tobacco or illicit drugs  . Current medications and supplements . Functional ability and status . Nutritional status . Physical activity . Advanced directives . List of other physicians . Hospitalizations, surgeries, and ER visits in previous 12 months . Vitals . Screenings to include cognitive, depression, and falls . Referrals and appointments  In addition, I have reviewed and discussed with patient certain preventive protocols, quality metrics, and best practice recommendations. A written personalized care plan for preventive services as well as general preventive health recommendations were provided to patient.     OBrien-Blaney, Denisa L, LPN  12/15/5991    I have reviewed the above information and agree with above.   Deborra Medina, MD

## 2018-03-15 NOTE — Progress Notes (Signed)
Subjective:  Patient ID: Crystal Meyers, female    DOB: September 19, 1945  Age: 72 y.o. MRN: [id number]  CC: The primary encounter diagnosis was Diabetes mellitus without complication ([place]). Diagnoses of Hypothyroidism due to acquired atrophy of thyroid, Left leg swelling, Need for influenza vaccination, Leg edema, left, Tobacco abuse counseling, and Hyperlipidemia LDL goal <100 were also pertinent to this visit.  HPI Crystal Meyers presents for  month follow up on diet controlled diabetes.  Patient has no complaints today.  Patient is following a low glycemic index diet and taking all prescribed medications regularly without side effects.  NOT CHECKING SUGARS BC she ran out of strips a few months ago,  And hasn't been able to tell the pharmacist what meter she is using . 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  Patient has had an eye exam in the last 12 months and  Does not like her bifocals due to blurred vision ,  Due to early cataracts  Reads ok  Avoids night driving.  . checks feet regularly for signs of infection.  Patient does not walk barefoot outside,  And denies an numbness tingling or burning in feet. Patient is up to date on all recommended vaccinations.   Outpatient Medications Prior to Visit  Medication Sig Dispense Refill  . atorvastatin (LIPITOR) 40 MG tablet Take 1 tablet (40 mg total) by mouth daily. 90 tablet 1  . levothyroxine (SYNTHROID, LEVOTHROID) 75 MCG tablet Take 1 tablet (75 mcg total) by mouth daily. 90 tablet 1  . lisinopril (PRINIVIL,ZESTRIL) 40 MG tablet Take 0.5 tablets (20 mg total) by mouth daily. 45 tablet 5  . nitroGLYCERIN (NITROSTAT) 0.4 MG SL tablet Place 1 tablet (0.4 mg total) under the tongue every 5 (five) minutes as needed for chest pain. 30 tablet 3  . omeprazole (PRILOSEC) 40 MG capsule TAKE 1  CAPSULE BY MOUTH DAILY 90 capsule 1  . omeprazole (PRILOSEC) 40 MG capsule Take 1 capsule (40 mg total) by mouth daily. 90 capsule 1  . rOPINIRole (REQUIP) 0.25 MG tablet TAKE 1 TABLET (0.25 MG TOTAL) BY MOUTH AT BEDTIME. MAY INCREASE WEEKLY AS NEEDED FOR RLS 90 tablet 1  . venlafaxine XR (EFFEXOR-XR) 150 MG 24 hr capsule TAKE 1 CAPSULE BY MOUTH EVERY DAY 30 capsule 0  . ONETOUCH DELICA LANCETS FINE MISC 1 application by Does not apply route daily. (Patient not taking: Reported on 03/15/2018) 100 each 2  . glucose blood test strip Use to check blood sugar once daily (Patient not taking: Reported on 03/15/2018) 100 each 2   No facility-administered medications prior to visit.     Review of Systems;  Patient denies headache, fevers, malaise, unintentional weight loss, skin rash, eye pain, sinus congestion and sinus pain, sore throat, dysphagia,  hemoptysis , cough, dyspnea, wheezing, chest pain, palpitations, orthopnea, edema, abdominal pain, nausea, melena, diarrhea, constipation, flank pain, dysuria, hematuria, urinary  Frequency, nocturia, numbness, tingling, seizures,  Focal weakness, Loss of consciousness,  Tremor, insomnia, depression, anxiety, and suicidal ideation.      Objective:  BP 120/76 (BP Location: Right Arm, Patient Position: Sitting, Cuff Size: Normal)   Pulse 86   Temp 98.4 F (36.9 C) (Oral)   Resp 15   Ht 5\' 6"  (1.676 m)   Wt 195 lb 6.4 oz (88.6 kg)  SpO2 96%   BMI 31.54 kg/m   BP Readings from Last 3 Encounters:  03/15/18 120/76  03/15/18 120/76  09/12/17 (!) 110/54    Wt Readings from Last 3 Encounters:  03/15/18 195 lb (88.5 kg)  03/15/18 195 lb 6.4 oz (88.6 kg)  09/12/17 197 lb 9.6 oz (89.6 kg)    General appearance: alert, cooperative and appears stated age Ears: normal TM's and external ear canals both ears Throat: lips, mucosa, and tongue normal; teeth and gums normal Neck: no adenopathy, no carotid bruit, supple, symmetrical, trachea midline and  thyroid not enlarged, symmetric, no tenderness/mass/nodules Back: symmetric, no curvature. ROM normal. No CVA tenderness. Lungs: clear to auscultation bilaterally Heart: regular rate and rhythm, S1, S2 normal, no murmur, click, rub or gallop Abdomen: soft, non-tender; bowel sounds normal; no masses,  no organomegaly Pulses: 2+ and symmetric Skin: Skin color, texture, turgor normal. No rashes or lesions Lymph nodes: Cervical, supraclavicular, and axillary nodes normal.  Lab Results  Component Value Date   HGBA1C 6.6 (A) 03/15/2018   HGBA1C 6.5 09/08/2017   HGBA1C 6.5 06/09/2017    Lab Results  Component Value Date   CREATININE 1.01 03/15/2018   CREATININE 1.06 09/08/2017   CREATININE 1.01 03/10/2017    Lab Results  Component Value Date   WBC 6.1 04/14/2017   HGB 12.4 04/14/2017   HCT 35.2 04/14/2017   PLT 218 04/14/2017   GLUCOSE 116 (H) 03/15/2018   CHOL 132 03/15/2018   TRIG 134.0 03/15/2018   HDL 38.80 (L) 03/15/2018   LDLDIRECT 71.0 08/31/2016   LDLCALC 66 03/15/2018   ALT 34 03/15/2018   AST 30 03/15/2018   NA 140 03/15/2018   K 4.3 03/15/2018   CL 105 03/15/2018   CREATININE 1.01 03/15/2018   BUN 12 03/15/2018   CO2 30 03/15/2018   TSH 3.09 03/15/2018   HGBA1C 6.6 (A) 03/15/2018   MICROALBUR 0.9 09/08/2017    Ct Chest Lung Cancer Screening Low Dose Wo Contrast  Result Date: 06/05/2017 CLINICAL DATA:  Seventy eighty pack-year smoking history. Current smoker. Breast cancer with bilateral mastectomies and breast implants 8 years ago. EXAM: CT CHEST WITHOUT CONTRAST LOW-DOSE FOR LUNG CANCER SCREENING TECHNIQUE: Multidetector CT imaging of the chest was performed following the standard protocol without IV contrast. COMPARISON:  Chest radiograph 03/08/2017.  Chest CT 05/24/2016. FINDINGS: Cardiovascular: Aortic atherosclerosis. Normal heart size, without pericardial effusion. Multivessel coronary artery atherosclerosis. Mediastinum/Nodes: No mediastinal or definite  hilar adenopathy, given limitations of unenhanced CT. No axillary adenopathy. No internal mammary adenopathy. Lungs/Pleura: No pleural fluid. Calcified and noncalcified pulmonary nodules are not significantly changed. No suspicious or enlarging nodules. Upper Abdomen: Mild hepatic steatosis. Normal imaged portions of the spleen, stomach, pancreas, gallbladder, adrenal glands, kidneys. Musculoskeletal: Bilateral breast implants. Mild thoracic spondylosis. IMPRESSION: 1. Lung-RADS 2, benign appearance or behavior. Continue annual screening with low-dose chest CT without contrast in 12 months. 2. Coronary artery atherosclerosis. Aortic Atherosclerosis (ICD10-I70.0). 3. Mild hepatic steatosis. Electronically Signed   By: Abigail Miyamoto M.D.   On: 06/05/2017 13:15    Assessment & Plan:   Problem List Items Addressed This Visit    Diabetes mellitus without complication ([place]) - Primary    Currently well-controlled on current medications in spite of patient not monitoring  .  hemoglobin A1c is at goal of less than 7.0 . Patient is reminded to schedule an annual eye exam and foot exam is normal today. Patient has no microalbuminuria. Patient is tolerating statin therapy for CAD risk  reduction and on ACE/ARB for renal protection and hypertension.  Lab Results  Component Value Date   HGBA1C 6.6 (A) 03/15/2018   Lab Results  Component Value Date   MICROALBUR 0.9 09/08/2017         Relevant Orders   POCT HgB A1C (Completed)   Comprehensive metabolic panel (Completed)   Lipid panel (Completed)   Hyperlipidemia LDL goal <100    LDL and triglycerides are at goal on current medications. She has no side effects and liver enzymes are normal. No changes today   Lab Results  Component Value Date   CHOL 132 03/15/2018   HDL 38.80 (L) 03/15/2018   LDLCALC 66 03/15/2018   LDLDIRECT 71.0 08/31/2016   TRIG 134.0 03/15/2018   CHOLHDL 3 03/15/2018   Lab Results  Component Value Date   ALT 34 03/15/2018    AST 30 03/15/2018   ALKPHOS 45 03/15/2018   BILITOT 0.3 03/15/2018         Hypothyroidism    Thyroid function is WNL on current dose.  No current changes needed.   Lab Results  Component Value Date   TSH 3.09 03/15/2018         Relevant Orders   TSH (Completed)   Leg edema, left    Painless asymmetry noted on exam..  History of breast cancer,  Ultrasound ordered to rule out DVT       Tobacco abuse counseling    Again Urged to quit  Smoking using a gradual reduction .  [name]  Prior intolerance to chantix and wellbutrin.        Other Visit Diagnoses    Left leg swelling       Relevant Orders   US Venous Img Lower Unilateral Left   Need for influenza vaccination       Relevant Orders   Flu vaccine HIGH DOSE PF (Fluzone High dose) (Completed)     A total of 25 minutes of face to face time was spent with patient more than half of which was spent in counselling about the above mentioned conditions  and coordination of care  I have discontinued Landrie D. Nylen's glucose blood. I am also having her start on glucose blood and Tdap. Additionally, I am having her maintain her nitroGLYCERIN, ONETOUCH DELICA LANCETS FINE, omeprazole, atorvastatin, omeprazole, lisinopril, venlafaxine XR, rOPINIRole, and levothyroxine.  Meds ordered this encounter  Medications  . glucose blood test strip    Sig: ONE TOUCH VERIO TEST STRIP   USE ONCE DAILY TO TEST BLOOD SUGARS  E 11.9    Dispense:  100 each    Refill:  3  . Tdap (BOOSTRIX) 5-2.5-18.5 LF-MCG/0.5 injection    Sig: Inject 0.5 mLs into the muscle once for 1 dose.    Dispense:  0.5 mL    Refill:  0    Medications Discontinued During This Encounter  Medication Reason  . glucose blood test strip     Follow-up: Return in about 6 months (around 09/13/2018).   [name], MD

## 2018-03-18 DIAGNOSIS — R600 Localized edema: Secondary | ICD-10-CM | POA: Insufficient documentation

## 2018-03-18 NOTE — Assessment & Plan Note (Signed)
LDL and triglycerides are at goal on current medications. She has no side effects and liver enzymes are normal. No changes today   Lab Results  Component Value Date   CHOL 132 03/15/2018   HDL 38.80 (L) 03/15/2018   LDLCALC 66 03/15/2018   LDLDIRECT 71.0 08/31/2016   TRIG 134.0 03/15/2018   CHOLHDL 3 03/15/2018   Lab Results  Component Value Date   ALT 34 03/15/2018   AST 30 03/15/2018   ALKPHOS 45 03/15/2018   BILITOT 0.3 03/15/2018

## 2018-03-18 NOTE — Assessment & Plan Note (Signed)
Currently well-controlled on current medications in spite of patient not monitoring  .  hemoglobin A1c is at goal of less than 7.0 . Patient is reminded to schedule an annual eye exam and foot exam is normal today. Patient has no microalbuminuria. Patient is tolerating statin therapy for CAD risk reduction and on ACE/ARB for renal protection and hypertension.  Lab Results  Component Value Date   HGBA1C 6.6 (A) 03/15/2018   Lab Results  Component Value Date   MICROALBUR 0.9 09/08/2017

## 2018-03-18 NOTE — Assessment & Plan Note (Signed)
Thyroid function is WNL on current dose.  No current changes needed.   Lab Results  Component Value Date   TSH 3.09 03/15/2018

## 2018-03-18 NOTE — Assessment & Plan Note (Signed)
Again Urged to quit  Smoking using a gradual reduction .  .  Prior intolerance to chantix and wellbutrin.  

## 2018-03-18 NOTE — Assessment & Plan Note (Signed)
Painless asymmetry noted on exam..  History of breast cancer,  Ultrasound ordered to rule out DVT

## 2018-03-21 ENCOUNTER — Ambulatory Visit (INDEPENDENT_AMBULATORY_CARE_PROVIDER_SITE_OTHER): Payer: Medicare Other | Admitting: Internal Medicine

## 2018-03-21 ENCOUNTER — Encounter: Payer: Self-pay | Admitting: Internal Medicine

## 2018-03-21 ENCOUNTER — Ambulatory Visit
Admission: RE | Admit: 2018-03-21 | Discharge: 2018-03-21 | Disposition: A | Discharge status: Home / Self Care | Payer: Medicare Other | Source: Ambulatory Visit | Attending: Internal Medicine | Admitting: Internal Medicine

## 2018-03-21 ENCOUNTER — Telehealth: Payer: Self-pay | Admitting: *Deleted

## 2018-03-21 VITALS — BP 104/60 | HR 86 | Temp 98.1°F | Resp 16 | Ht 66.0 in | Wt 196.6 lb

## 2018-03-21 DIAGNOSIS — M7989 Other specified soft tissue disorders: Secondary | ICD-10-CM

## 2018-03-21 DIAGNOSIS — I824Z2 Acute embolism and thrombosis of unspecified deep veins of left distal lower extremity: Secondary | ICD-10-CM | POA: Diagnosis not present

## 2018-03-21 MED ORDER — RIVAROXABAN (XARELTO) VTE STARTER PACK (15 & 20 MG): ORAL_TABLET | ORAL | 0 refills | Status: DC

## 2018-03-21 NOTE — Patient Instructions (Signed)
Your Vassie Loll suggested that you may have a DVT in the distal veins of your left leg  I recommend starting Xarelto while we confirm the diagnosis with a vascular surgery evaluation   Deep Vein Thrombosis Deep vein thrombosis (DVT) is a condition in which a blood clot forms in a deep vein, such as a lower leg, thigh, or arm vein. A clot is blood that has thickened into a gel or solid. This condition is dangerous. It can lead to serious and even life-threatening complications if the clot travels to the lungs and causes a blockage (pulmonary embolism). It can also damage veins in the leg. This can result in leg pain, swelling, discoloration, and sores (post-thrombotic syndrome). What are the causes? This condition may be caused by:  A slowdown of blood flow.  Damage to a vein.  A condition that makes blood clot more easily.  What increases the risk? The following factors may make you more likely to develop this condition:  Being overweight.  Being elderly, especially over age 32.  Sitting or lying down for more than four hours.  Lack of physical activity (sedentary lifestyle).  Being pregnant, giving birth, or having recently given birth.  Taking medicines that contain estrogen.  Smoking.  A history of any of the following: ? Blood clots or blood clotting disease. ? Peripheral vascular disease. ? Inflammatory bowel disease. ? Cancer. ? Heart disease. ? Genetic conditions that affect how blood clots. ? Neurological diseases that affect the legs (leg paresis). ? Injury. ? Major or lengthy surgery. ? A central line placed inside a large vein.  What are the signs or symptoms? Symptoms of this condition include:  Swelling, pain, or tenderness in an arm or leg.  Warmth, redness, or discoloration in an arm or leg.  If the clot is in your leg, symptoms may be more noticeable or worse when you stand or walk. Some people do not have any symptoms. How is this diagnosed? This  condition is diagnosed with:  A medical history.  A physical exam.  Tests, such as: ? Blood tests. These are done to see how your blood clots. ? Imaging tests. These are done to check for clots. Tests may include:  Ultrasound.  CT scan.  MRI.  X-ray.  Venogram. For this test, X-rays are taken after a dye is injected into a vein.  How is this treated? Treatment for this condition depends on the cause, your risk for bleeding or developing more clots, and any medical conditions you have. Treatment may include:  Taking blood thinners (also called anticoagulants). These medicines may be taken by mouth, injected under the skin, or injected through an IV tube (catheter). These medicines prevent clots from forming.  Injecting medicine that dissolves blood clots into the affected vein (catheter-directed thrombolysis).  Having surgery. Surgery may be done to: ? Remove the clot. ? Place a filter in a large vein to catch blood clots before they reach the lungs.  Some treatments may be continued for up to six months. Follow these instructions at home: If you are taking an oral blood thinner:  Take the medicine exactly as told by your health care provider. Some blood thinners need to be taken at the same time every day. Do not skip a dose.  Ask your health care provider about what foods and drugs interact with the medicine.  Ask about possible side effects. General instructions  Blood thinners can cause easy bruising and difficulty stopping bleeding. Because of this, if  you are taking or were given a blood thinner: ? Hold pressure over cuts for longer than usual. ? Tell your dentist and other health care providers that you are taking blood thinners before having any procedures that can cause bleeding. ? Avoid contact sports.  Take over-the-counter and prescription medicines only as told by your health care provider.  Return to your normal activities as told by your health care  provider. Ask your health care provider what activities are safe for you.  Wear compression stockings if recommended by your health care provider.  Keep all follow-up visits as told by your health care provider. This is important. How is this prevented? To lower your risk of developing this condition again:  For 30 or more minutes every day, do an activity that: ? Involves moving your arms and legs. ? Increases your heart rate.  When traveling for longer than four hours: ? Exercise your arms and legs every hour. ? Drink plenty of water. ? Avoid drinking alcohol.  Avoid sitting or lying for a long time without moving your legs.  Stay a healthy weight.  If you are a woman who is older than age 2, avoid unnecessary use of medicines that contain estrogen.  Do not use any products that contain nicotine or tobacco, such as cigarettes and e-cigarettes. This is especially important if you take estrogen medicines. If you need help quitting, ask your health care provider.  Contact a health care provider if:  You miss a dose of your blood thinner.  You have nausea, vomiting, or diarrhea that lasts for more than one day.  Your menstrual period is heavier than usual.  You have unusual bruising. Get help right away if:  You have new or increased pain, swelling, or redness in an arm or leg.  You have numbness or tingling in an arm or leg.  You have shortness of breath.  You have chest pain.  You have a rapid or irregular heartbeat.  You feel light-headed or dizzy.  You cough up blood.  There is blood in your vomit, stool, or urine.  You have a serious fall or accident, or you hit your head.  You have a severe headache or confusion.  You have a cut that will not stop bleeding. These symptoms may represent a serious problem that is an emergency. Do not wait to see if the symptoms will go away. Get medical help right away. Call your local emergency services (911 in the [place]  Do not drive yourself to the hospital. Summary  DVT is a condition in which a blood clot forms in a deep vein, such as a lower leg, thigh, or arm vein.  Symptoms can include swelling, warmth, pain, and redness in your leg or arm.  Treatment may include taking blood thinners, injecting medicine that dissolves blood clots,wearing compression stockings, or surgery.  If you are prescribed blood thinners, take them exactly as told. This information is not intended to replace advice given to you by your health care provider. Make sure you discuss any questions you have with your health care provider. Document Released: 06/27/2005 Document Revised: 07/30/2016 Document Reviewed: 07/30/2016 Elsevier Interactive Patient Education  2018 [employer].

## 2018-03-21 NOTE — Telephone Encounter (Signed)
Patient was informed to come straight to the office.

## 2018-03-21 NOTE — Progress Notes (Signed)
Subjective:  Patient ID: Crystal Meyers, female    DOB: 11-23-1945  Age: 72 y.o. MRN: [id number]  CC: The encounter diagnosis was Acute deep vein thrombosis (DVT) of distal end of left lower extremity ([place]).  HPI Crystal Meyers presents for follow up on abnormal Venous duplex ultrasound of left LE done today to evaluate leg swelling mentioned during her follow up visit on sept 5 for T2D.  .  A possible occlusive thrombus in the left posterior tibial and peroneal veins was reported,  With no evidence of a  proximal DVT or superficial vein thrombosis.  Patient denies pleuritic chest pain and shortness of breath.  She continues to note asymmetry of legs with significant edema involving the lower third of her left leg.  She denies pain. She is not sure how long the swelling has been present.   She has no history of recent or prior surgery on left leg. She was diagnosed and treated for breast cancer in 2017 , and uses tobacco daily habitually.   She is scheduled to have bsurgical correction of bilateral vision limiting ptosis on Dec 11.    Outpatient Medications Prior to Visit  Medication Sig Dispense Refill  . atorvastatin (LIPITOR) 40 MG tablet Take 1 tablet (40 mg total) by mouth daily. 90 tablet 1  . glucose blood test strip ONE TOUCH VERIO TEST STRIP   USE ONCE DAILY TO TEST BLOOD SUGARS  E 11.9 100 each 3  . levothyroxine (SYNTHROID, LEVOTHROID) 75 MCG tablet Take 1 tablet (75 mcg total) by mouth daily. 90 tablet 1  . lisinopril (PRINIVIL,ZESTRIL) 40 MG tablet Take 0.5 tablets (20 mg total) by mouth daily. 45 tablet 5  . nitroGLYCERIN (NITROSTAT) 0.4 MG SL tablet Place 1 tablet (0.4 mg total) under the tongue every 5 (five) minutes as needed for chest pain. 30 tablet 3  . omeprazole (PRILOSEC) 40 MG capsule TAKE 1 CAPSULE BY MOUTH DAILY 90 capsule 1  . omeprazole (PRILOSEC) 40 MG capsule Take 1 capsule (40 mg total) by mouth daily. 90 capsule 1  . ONETOUCH DELICA LANCETS FINE MISC 1  application by Does not apply route daily. 100 each 2  . rOPINIRole (REQUIP) 0.25 MG tablet TAKE 1 TABLET (0.25 MG TOTAL) BY MOUTH AT BEDTIME. MAY INCREASE WEEKLY AS NEEDED FOR RLS 90 tablet 1  . venlafaxine XR (EFFEXOR-XR) 150 MG 24 hr capsule TAKE 1 CAPSULE BY MOUTH EVERY DAY 30 capsule 0   No facility-administered medications prior to visit.     Review of Systems;  Patient denies headache, fevers, malaise, unintentional weight loss, skin rash, eye pain, sinus congestion and sinus pain, sore throat, dysphagia,  hemoptysis , cough, dyspnea, wheezing, chest pain, palpitations, orthopnea,  abdominal pain, nausea, melena, diarrhea, constipation, flank pain, dysuria, hematuria, urinary  Frequency, nocturia, numbness, tingling, seizures,  Focal weakness, Loss of consciousness,  Tremor, insomnia, depression, anxiety, and suicidal ideation.      Objective:  BP 104/60 (BP Location: Left Arm, Patient Position: Sitting, Cuff Size: Normal)   Pulse 86   Temp 98.1 F (36.7 C) (Oral)   Resp 16   Ht 5\' 6"  (1.676 m)   Wt 196 lb 9.6 oz (89.2 kg)   SpO2 97%   BMI 31.73 kg/m   BP Readings from Last 3 Encounters:  03/21/18 104/60  03/15/18 120/76  03/15/18 120/76    Wt Readings from Last 3 Encounters:  03/21/18 196 lb 9.6 oz (89.2 kg)  03/15/18 195 lb (88.5 kg)  03/15/18 195 lb 6.4 oz (88.6 kg)    General appearance: alert, cooperative and appears stated age Ears: normal TM's and external ear canals both ears Throat: lips, mucosa, and tongue normal; teeth and gums normal Neck: no adenopathy, no carotid bruit, supple, symmetrical, trachea midline and thyroid not enlarged, symmetric, no tenderness/mass/nodules Back: symmetric, no curvature. ROM normal. No CVA tenderness. Lungs: clear to auscultation bilaterally Heart: regular rate and rhythm, S1, S2 normal, no murmur, click, rub or gallop Abdomen: soft, non-tender; bowel sounds normal; no masses,  no organomegaly Pulses: 2+ and  symmetric Skin: LLE with swelling more pronounced at the medial ankle,  increaesed number of spider veins comparatively, and increased diameter of calf compared to right leg.  Lymph nodes: Cervical, supraclavicular, and axillary nodes normal.  Lab Results  Component Value Date   HGBA1C 6.6 (A) 03/15/2018   HGBA1C 6.5 09/08/2017   HGBA1C 6.5 06/09/2017    Lab Results  Component Value Date   CREATININE 1.01 03/15/2018   CREATININE 1.06 09/08/2017   CREATININE 1.01 03/10/2017    Lab Results  Component Value Date   WBC 6.1 04/14/2017   HGB 12.4 04/14/2017   HCT 35.2 04/14/2017   PLT 218 04/14/2017   GLUCOSE 116 (H) 03/15/2018   CHOL 132 03/15/2018   TRIG 134.0 03/15/2018   HDL 38.80 (L) 03/15/2018   LDLDIRECT 71.0 08/31/2016   LDLCALC 66 03/15/2018   ALT 34 03/15/2018   AST 30 03/15/2018   NA 140 03/15/2018   K 4.3 03/15/2018   CL 105 03/15/2018   CREATININE 1.01 03/15/2018   BUN 12 03/15/2018   CO2 30 03/15/2018   TSH 3.09 03/15/2018   HGBA1C 6.6 (A) 03/15/2018   MICROALBUR 0.9 09/08/2017    US Venous Img Lower Unilateral Left  Result Date: 03/21/2018 CLINICAL DATA:  Left leg swelling for 2 months EXAM: LEFT LOWER EXTREMITY VENOUS DUPLEX ULTRASOUND TECHNIQUE: Doppler venous assessment of the left lower extremity deep venous system was performed, including characterization of spectral flow, compressibility, and phasicity. COMPARISON:  None. FINDINGS: There is complete compressibility of the left common femoral, femoral, and popliteal veins. Doppler analysis demonstrates respiratory phasicity and augmentation of flow with calf compression. Color Doppler analysis in the calf demonstrates possible occlusive thrombus in the left posterior tibial and peroneal veins. No evidence of superficial vein thrombosis. IMPRESSION: There is possible occlusive thrombus in the left posterior tibial and peroneal veins within the calf is. No evidence of DVT in the left femoral, common femoral,  or popliteal veins. Electronically Signed   By: Marybelle Killings M.D.   On: 03/21/2018 15:32    Assessment & Plan:   Problem List Items Addressed This Visit    Acute deep vein thrombosis (DVT) of distal end of left lower extremity ([place]) - Primary    Given her swelling,  Recent history of CA, will treat with Xarelto and refer to Vascular to confirm DVT suggested by venous doppler.    Minimum therapy would be 3 months,  So if confirmed,  And if anticoagulation is continued,  She should postpone her eye surgery by at least a week (planned for Dec 11)       Relevant Medications   Rivaroxaban 15 & 20 MG TBPK   Other Relevant Orders   Ambulatory referral to Vascular Surgery    A total of 25 minutes of face to face time was spent with patient more than half of which was spent in counselling about the above mentioned conditions  and coordination of care   I am having Carrington D. Shuttleworth maintain her nitroGLYCERIN, ONETOUCH DELICA LANCETS FINE, omeprazole, atorvastatin, omeprazole, lisinopril, venlafaxine XR, rOPINIRole, levothyroxine, glucose blood, and Rivaroxaban.  Meds ordered this encounter  Medications  . DISCONTD: Rivaroxaban 15 & 20 MG TBPK    Sig: Take as directed on package: Start with one 15mg  tablet by mouth twice a day with food. On Day 22, switch to one 20mg  tablet once a day with food.    Dispense:  51 each    Refill:  0  . Rivaroxaban 15 & 20 MG TBPK    Sig: Take as directed on package: Start with one 15mg  tablet by mouth twice a day with food. On Day 22, switch to one 20mg  tablet once a day with food.    Dispense:  51 each    Refill:  0    Medications Discontinued During This Encounter  Medication Reason  . Rivaroxaban 15 & 20 MG TBPK Reorder    Follow-up: No follow-ups on file.   [name], MD

## 2018-03-21 NOTE — Telephone Encounter (Signed)
'  Crystal Meyers' from [facility] [place] calling to report following..  . IMPRESSION: There is possible occlusive thrombus in the left posterior tibial and peroneal veins within the calf is. No evidence of DVT in the left femoral, common femoral, or popliteal veins.  Practice called, NT spoke to Patina; message from Dr. Derrel Nip related to technician. Instructed Crystal Meyers to tell pt to go directly to the office.

## 2018-03-22 DIAGNOSIS — I824Z2 Acute embolism and thrombosis of unspecified deep veins of left distal lower extremity: Principal | ICD-10-CM

## 2018-03-22 DIAGNOSIS — Z86718 Personal history of other venous thrombosis and embolism: Secondary | ICD-10-CM | POA: Insufficient documentation

## 2018-03-22 NOTE — Assessment & Plan Note (Addendum)
Given her swelling,  Recent history of CA, will treat with Xarelto and refer to Vascular to confirm DVT suggested by venous doppler.    Minimum therapy would be 3 months,  So if confirmed,  And if anticoagulation is continued,  She should postpone her eye surgery by at least a week (planned for Dec 11)

## 2018-03-23 ENCOUNTER — Telehealth: Payer: Self-pay | Admitting: Internal Medicine

## 2018-03-23 MED ORDER — ELIQUIS 5 MG VTE STARTER PACK: ORAL_TABLET | ORAL | 0 refills | Status: DC

## 2018-03-23 NOTE — Telephone Encounter (Signed)
Copied from [place] [id number]. Topic: Quick Communication - See Telephone Encounter >> Mar 23, 2018  3:50 PM Synthia Innocent wrote: CRM for notification. See Telephone encounter for: 03/23/18. Patient requesting something else be called in in place of Rivaroxaban 15 & 20 MG TBPK, cost over $ 400. Please advise

## 2018-03-23 NOTE — Telephone Encounter (Signed)
Patient notified and voiced understanding.

## 2018-03-23 NOTE — Telephone Encounter (Signed)
[facility]QU[facility] SENT TO [facility].

## 2018-03-29 ENCOUNTER — Ambulatory Visit (INDEPENDENT_AMBULATORY_CARE_PROVIDER_SITE_OTHER): Payer: Medicare Other | Admitting: Nurse Practitioner

## 2018-03-29 ENCOUNTER — Encounter (INDEPENDENT_AMBULATORY_CARE_PROVIDER_SITE_OTHER): Payer: Self-pay | Admitting: Nurse Practitioner

## 2018-03-29 VITALS — BP 134/89 | HR 82 | Resp 16 | Ht 66.0 in | Wt 196.0 lb

## 2018-03-29 DIAGNOSIS — I824Z2 Acute embolism and thrombosis of unspecified deep veins of left distal lower extremity: Secondary | ICD-10-CM

## 2018-03-29 DIAGNOSIS — I10 Essential (primary) hypertension: Secondary | ICD-10-CM

## 2018-03-29 DIAGNOSIS — I872 Venous insufficiency (chronic) (peripheral): Secondary | ICD-10-CM

## 2018-03-29 DIAGNOSIS — E119 Type 2 diabetes mellitus without complications: Secondary | ICD-10-CM

## 2018-03-29 NOTE — Progress Notes (Signed)
Subjective:    Patient ID: Crystal Meyers, female    DOB: 1945/11/20, 72 y.o.   MRN: [id number] Chief Complaint  Patient presents with  . Follow-up    ref [place] for DVT    HPI  Crystal Meyers is a 72 y.o. female that presents to the office for evaluation of DVT.  DVT was identified at [facility] by Duplex ultrasound.  The initial symptoms were pain and swelling in the lower extremity.  The patient notes the leg continues to be painful with dependency and swells quite a bit.  Symptoms are much better with elevation.  The patient notes minimal edema in the morning which steadily worsens throughout the day.    The patient has not been using compression therapy at this point.  No SOB or pleuritic chest pains.  No cough or hemoptysis.  No blood per rectum or blood in any sputum.  No excessive bruising per the patient.   The patient also reports that her father also had a DVT which resulted in a pulmonary embolism.  She also reports that her brother has some sort of clotting issue.  She states that he has had DVTs before.  Review of Systems: Negative Unless Checked Constitutional: [] Weight loss  [] Fever  [] Chills Cardiac: [] Chest pain   [] Chest pressure   [] Palpitations   [] Shortness of breath when laying flat   [] Shortness of breath with exertion. Vascular:  [] Pain in legs with walking   [] Pain in legs with standing  [x] History of DVT   [] Phlebitis   [x] Swelling in legs   [] Varicose veins   [] Non-healing ulcers Pulmonary:   [] Uses home oxygen   [] Productive cough   [] Hemoptysis   [] Wheeze  [] COPD   [] Asthma Neurologic:  [] Dizziness   [] Seizures   [] History of stroke   [] History of TIA  [] Aphasia   [] Vissual changes   [] Weakness or numbness in arm   [] Weakness or numbness in leg Musculoskeletal:   [] Joint swelling   [] Joint pain   [] Low back pain Hematologic:  [] Easy bruising  [] Easy bleeding   [] Hypercoagulable state   [] Anemic Gastrointestinal:  [] Diarrhea   [] Vomiting  [] Gastroesophageal  reflux/heartburn   [] Difficulty swallowing. Genitourinary:  [] Chronic kidney disease   [] Difficult urination  [] Frequent urination   [] Blood in urine Skin:  [] Rashes   [] Ulcers  Psychological:  [] History of anxiety   []  History of major depression.     Objective:   Physical Exam  BP 134/89 (BP Location: Right Arm)   Pulse 82   Resp 16   Ht 5\' 6"  (1.676 m)   Wt 196 lb (88.9 kg)   BMI 31.64 kg/m   Past Medical History:  Diagnosis Date  . Barrett's esophagus   . Breast cancer ([place]) 7/10   left , invasive lobular carcinoma  . Complication of anesthesia    difficulty waking up and sleep apnea, low sats  . COPD (chronic obstructive pulmonary disease) ([place])   . Coronary artery disease   . Depression   . GERD (gastroesophageal reflux disease)   . Hypercholesteremia   . Hypertension   . Hypothyroidism   . Personal history of tobacco use, presenting hazards to health 05/06/2015  . Personal history of tobacco use, presenting hazards to health 05/06/2015  . Pneumonia    hx of  . PVC (premature ventricular contraction)   . Sleep apnea    wears CPAP nightly  . Trigeminal neuralgia      Gen: WD/WN, NAD, flushed face Head: [place]/AT,  No temporalis wasting.  Ear/Nose/Throat: Hearing grossly intact, nares w/o erythema or drainage Eyes: PER, EOMI, sclera nonicteric.  Neck: Supple, no masses.  No JVD.  Pulmonary:  Good air movement, no use of accessory muscles.  Cardiac: RRR Vascular:  Left lower extremity slightly larger than right lower extremity.  Scattered varicosities through both legs.  Mild edema on the left lower extremity. Gastrointestinal: soft, non-distended. No guarding/no peritoneal signs.  Musculoskeletal: M/S 5/5 throughout.  No deformity or atrophy.  Neurologic: Pain and light touch intact in extremities.  Symmetrical.  Speech is fluent. Motor exam as listed above. Psychiatric: Judgment intact, Mood & affect appropriate for pt's clinical situation. Dermatologic: No  Venous rashes. No Ulcers Noted.  No changes consistent with cellulitis. Lymph : No Cervical lymphadenopathy, no lichenification or skin changes of chronic lymphedema.   Social History   Socioeconomic History  . Marital status: Widowed    Spouse name: Not on file  . Number of children: Not on file  . Years of education: Not on file  . Highest education level: Not on file  Occupational History  . Not on file  Social Needs  . Financial resource strain: Not hard at all  . Food insecurity:    Worry: Never true    Inability: Never true  . Transportation needs:    Medical: No    Non-medical: No  Tobacco Use  . Smoking status: Current Every Day Smoker    Packs/day: 1.00    Years: 54.00    Pack years: 54.00    Types: Cigarettes  . Smokeless tobacco: Never Used  . Tobacco comment: Refused cessation material  Substance and Sexual Activity  . Alcohol use: No  . Drug use: No  . Sexual activity: Never  Lifestyle  . Physical activity:    Days per week: 0 days    Minutes per session: Not on file  . Stress: Not on file  Relationships  . Social connections:    Talks on phone: Not on file    Gets together: Not on file    Attends religious service: Not on file    Active member of club or organization: Not on file    Attends meetings of clubs or organizations: Not on file    Relationship status: Not on file  . Intimate partner violence:    Fear of current or ex partner: Not on file    Emotionally abused: Not on file    Physically abused: Not on file    Forced sexual activity: Not on file  Other Topics Concern  . Not on file  Social History Narrative   Lives with mother.   Recently widowed.   Always uses seat belts   Has a bird and a dog.   No exercise.    Past Surgical History:  Procedure Laterality Date  . BREAST SURGERY Bilateral 2011   dbl mastectomy  . COLONOSCOPY W/ POLYPECTOMY    . CRANIECTOMY Right 01/21/2016   Procedure: Microvascular Decompression - right  trigemminal nerve;  Surgeon: [name], MD;  Location: [facility];  Service: Neurosurgery;  Laterality: Right;  . CRANIOTOMY Right 02/03/2016   Procedure: REPAIR OF CEREBROSPINAL FLUID LEAK AND HARVEST ABDOMINAL FAT GRAFT;  Surgeon: [name], MD;  Location: [facility];  Service: Neurosurgery;  Laterality: Right;  . LUMBAR LAMINECTOMY/DECOMPRESSION MICRODISCECTOMY Left 08/11/2015   Procedure: Left Lumbar four-five microdiscectomy;  Surgeon: [name], MD;  Location: [facility];  Service: Neurosurgery;  Laterality: Left;  [name] MASTECTOMY  05/2009   bilateral, reconstructive surgery 09/2009  . MASTECTOMY     Bilateral     Family History  Problem Relation Age of Onset  . Mental illness Mother   . Diabetes Mother   . Hypertension Mother   . Hyperlipidemia Mother   . Alzheimer's disease Mother   . Hypertension Father   . Hyperlipidemia Father   . Stroke Father   . Heart attack Father   . Diabetes Father   . Deep vein thrombosis Father   . Colon cancer Father   . Cancer Brother        Lung  . Diabetes Brother   . Diabetes Brother   . Stroke Maternal Grandmother     Allergies  Allergen Reactions  . Lamisil [Terbinafine] Rash  . No Known Allergies Rash       Assessment & Plan:   1. Acute deep vein thrombosis (DVT) of distal end of left lower extremity (HCC) Recommend:   No surgery or intervention at this point in time.  IVC filter is not indicated at present.  Patient's duplex ultrasound of the venous system shows DVT from the popliteal to the femoral veins.  The patient is initiated on anticoagulation.  She was given Eliquis samples to assist due to cost of medication.  Elevation was stressed, use of a recliner was discussed.  I have had a long discussion with the patient regarding DVT and post phlebitic changes such as swelling and why it  causes symptoms such as pain.  The patient will wear graduated compression stockings class 1 (20-30 mmHg),  beginning after three full days of anticoagulation, on a daily basis a prescription was given. The patient will  beginning wearing the stockings first thing in the morning and removing them in the evening. The patient is instructed specifically not to sleep in the stockings.  In addition, behavioral modification including elevation during the day and avoidance of prolonged dependency will be initiated.    Also given the patient's family history of deep venous thrombosis, as well as the "clotting disorder" that the patient's brother has, we will speak with the patient about undergoing a hypercoagulable panel which may inform us of whether she has a clotting disorder as well.  Patient is to follow-up in 6 weeks to determine resolution of her DVT.  - VAS [place] LOWER EXTREMITY VENOUS (DVT); Future  2. Chronic venous insufficiency Patient is experiencing increased pain and discomfort from varicose veins her left lower extremity.  Once her DVT has resolved we will evaluate her for venous reflux.  3. Essential hypertension Continue antihypertensive medications as already ordered, these medications have been reviewed and there are no changes at this time.   4. Diabetes mellitus without complication ([place]) Continue hypoglycemic medications as already ordered, these medications have been reviewed and there are no changes at this time.  Hgb A1C to be monitored as already arranged by primary service   Current Outpatient Medications on File Prior to Visit  Medication Sig Dispense Refill  . aspirin EC 81 MG tablet Take 81 mg by mouth daily.    [name] atorvastatin (LIPITOR) 40 MG tablet Take 1 tablet (40 mg total) by mouth daily. 90 tablet 1  . glucose blood test strip ONE TOUCH VERIO TEST STRIP   USE ONCE DAILY TO TEST BLOOD SUGARS  E 11.9 100 each 3  . levothyroxine (SYNTHROID, LEVOTHROID) 75 MCG tablet Take 1 tablet (75 mcg total) by mouth daily. 90 tablet 1  . lisinopril (PRINIVIL,ZESTRIL) 40  MG tablet Take 0.5  tablets (20 mg total) by mouth daily. 45 tablet 5  . nitroGLYCERIN (NITROSTAT) 0.4 MG SL tablet Place 1 tablet (0.4 mg total) under the tongue every 5 (five) minutes as needed for chest pain. 30 tablet 3  . omeprazole (PRILOSEC) 40 MG capsule TAKE 1 CAPSULE BY MOUTH DAILY 90 capsule 1  . omeprazole (PRILOSEC) 40 MG capsule Take 1 capsule (40 mg total) by mouth daily. 90 capsule 1  . ONETOUCH DELICA LANCETS FINE MISC 1 application by Does not apply route daily. 100 each 2  . rOPINIRole (REQUIP) 0.25 MG tablet TAKE 1 TABLET (0.25 MG TOTAL) BY MOUTH AT BEDTIME. MAY INCREASE WEEKLY AS NEEDED FOR RLS 90 tablet 1  . venlafaxine XR (EFFEXOR-XR) 150 MG 24 hr capsule TAKE 1 CAPSULE BY MOUTH EVERY DAY 30 capsule 0  . ELIQUIS STARTER PACK (ELIQUIS STARTER PACK) 5 MG TABS Take as directed on package: start with two-5mg  tablets twice daily for 7 days. On day 8, switch to one-5mg  tablet twice daily. (Patient not taking: Reported on 03/29/2018) 1 each 0   No current facility-administered medications on file prior to visit.     There are no Patient Instructions on file for this visit. No follow-ups on file.   Kris Hartmann, NP

## 2018-03-30 ENCOUNTER — Encounter (INDEPENDENT_AMBULATORY_CARE_PROVIDER_SITE_OTHER): Payer: Self-pay | Admitting: Nurse Practitioner

## 2018-04-13 ENCOUNTER — Inpatient Hospital Stay (HOSPITAL_BASED_OUTPATIENT_CLINIC_OR_DEPARTMENT_OTHER): Payer: Medicare Other | Admitting: Internal Medicine

## 2018-04-13 ENCOUNTER — Encounter: Payer: Self-pay | Admitting: Internal Medicine

## 2018-04-13 ENCOUNTER — Inpatient Hospital Stay: Payer: Medicare Other | Attending: Internal Medicine

## 2018-04-13 VITALS — BP 112/70 | HR 83 | Temp 98.1°F | Resp 16 | Wt 192.4 lb

## 2018-04-13 DIAGNOSIS — I82402 Acute embolism and thrombosis of unspecified deep veins of left lower extremity: Secondary | ICD-10-CM

## 2018-04-13 DIAGNOSIS — Z7901 Long term (current) use of anticoagulants: Secondary | ICD-10-CM | POA: Insufficient documentation

## 2018-04-13 DIAGNOSIS — C50412 Malignant neoplasm of upper-outer quadrant of left female breast: Secondary | ICD-10-CM | POA: Diagnosis not present

## 2018-04-13 DIAGNOSIS — I824Z2 Acute embolism and thrombosis of unspecified deep veins of left distal lower extremity: Secondary | ICD-10-CM

## 2018-04-13 DIAGNOSIS — Z9013 Acquired absence of bilateral breasts and nipples: Secondary | ICD-10-CM

## 2018-04-13 DIAGNOSIS — Z170 Estrogen receptor positive status [ER+]: Secondary | ICD-10-CM | POA: Insufficient documentation

## 2018-04-13 DIAGNOSIS — E340 Carcinoid syndrome: Secondary | ICD-10-CM

## 2018-04-13 DIAGNOSIS — Z79899 Other long term (current) drug therapy: Secondary | ICD-10-CM

## 2018-04-13 LAB — CBC WITH DIFFERENTIAL/PLATELET
BASOS ABS: 0 10*3/uL (ref 0–0.1)
Basophils Relative: 1 %
EOS ABS: 0.1 10*3/uL (ref 0–0.7)
EOS PCT: 2 %
HCT: 38.7 % (ref 35.0–47.0)
Hemoglobin: 13.3 g/dL (ref 12.0–16.0)
Lymphocytes Relative: 40 %
Lymphs Abs: 3.1 10*3/uL (ref 1.0–3.6)
MCH: 30.3 pg (ref 26.0–34.0)
MCHC: 34.3 g/dL (ref 32.0–36.0)
MCV: 88.3 fL (ref 80.0–100.0)
MONO ABS: 0.6 10*3/uL (ref 0.2–0.9)
Monocytes Relative: 8 %
Neutro Abs: 4 10*3/uL (ref 1.4–6.5)
Neutrophils Relative %: 49 %
Platelets: 257 10*3/uL (ref 150–440)
RBC: 4.39 MIL/uL (ref 3.80–5.20)
RDW: 13.8 % (ref 11.5–14.5)
WBC: 7.9 10*3/uL (ref 3.6–11.0)

## 2018-04-13 NOTE — Assessment & Plan Note (Addendum)
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 Finished letrozole March 2017.  [7 years]. Low risk-oncotype.  # Currently on surveillance.  No evidence of recurrence.  Stable.  # Acute LE DVT/swelling- [US below Knee] on Eliquis/PCP..   # Flushing of face/ sweats- ? Carcinoid syndorme. Check 5HIAA  Urine.   # smoking/LCSP- Nov 2018-wnl.   # Follow-up with [place] in one year./Labs.

## 2018-04-13 NOTE — Progress Notes (Signed)
Crystal Meyers OFFICE PROGRESS NOTE  Patient Care Team: [name], MD as PCP - General (Internal Medicine) [name], MD (Unknown Physician Specialty)  Cancer Staging No matching staging information was found for the patient.   Oncology History   1. 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 Low Oncotype DX score. Diagnosed in June 2010. 2. Status post-bilateral mastectomy and reconstructive surgery. 3. Started Femara approx. 2011.[name] 4.Breast cancer index revealed a 5.2% increase of late recurrence but low likelihood of benefit with extended adjuvant therapy (March of 2017) Letrozole was discontinued from March of 2017 ---------------------------------------------  DIAGNOSIS: BREAST CANCER  STAGE:   I      ;GOALS: cure   CURRENT/MOST RECENT THERAPY: surveillaince      Carcinoma of upper-outer quadrant of left breast in female, estrogen receptor positive ([place])     INTERVAL HISTORY:  Crystal Meyers 72 y.o.  female pleasant patient above history of stage I breast cancer is here for follow-up.  In the interim patient was noted to have a right lower extremity DVT below the knee.  No provoking incidents noted.  She also complains of intermittent flushing of the face more so the last few months.  She denies any cough or shortness of breath.  Denies any wheezing.  No diarrhea.  No lumps or bumps.  No bone pain.  Review of Systems  Constitutional: Negative for chills, diaphoresis, fever, malaise/fatigue and weight loss.  HENT: Negative for nosebleeds and sore throat.   Eyes: Negative for double vision.  Respiratory: Negative for cough, hemoptysis, sputum production, shortness of breath and wheezing.   Cardiovascular: Negative for chest pain, palpitations, orthopnea and leg swelling.  Gastrointestinal: Negative for abdominal pain, blood in stool,  constipation, diarrhea, heartburn, melena, nausea and vomiting.  Genitourinary: Negative for dysuria, frequency and urgency.  Musculoskeletal: Negative for back pain and joint pain.  Skin: Negative.  Negative for itching and rash.       Flushing.  Neurological: Negative for dizziness, tingling, focal weakness, weakness and headaches.  Endo/Heme/Allergies: Does not bruise/bleed easily.  Psychiatric/Behavioral: Negative for depression. The patient is not nervous/anxious and does not have insomnia.      PAST MEDICAL HISTORY :  Past Medical History:  Diagnosis Date  . Barrett's esophagus   . Breast cancer ([place]) 7/10   left , invasive lobular carcinoma  . Complication of anesthesia    difficulty waking up and sleep apnea, low sats  . COPD (chronic obstructive pulmonary disease) ([place])   . Coronary artery disease   . Depression   . GERD (gastroesophageal reflux disease)   . Hypercholesteremia   . Hypertension   . Hypothyroidism   . Personal history of tobacco use, presenting hazards to health 05/06/2015  . Personal history of tobacco use, presenting hazards to health 05/06/2015  . Pneumonia    hx of  . PVC (premature ventricular contraction)   . Sleep apnea    wears CPAP nightly  . Trigeminal neuralgia     PAST SURGICAL HISTORY :   Past Surgical History:  Procedure Laterality Date  . BREAST SURGERY Bilateral 2011   dbl mastectomy  . COLONOSCOPY W/ POLYPECTOMY    . CRANIECTOMY Right 01/21/2016   Procedure: Microvascular Decompression - right trigemminal nerve;  Surgeon: [name], MD;  Location: [facility];  Service: Neurosurgery;  Laterality: Right;  . CRANIOTOMY Right 02/03/2016   Procedure: REPAIR OF CEREBROSPINAL FLUID  LEAK AND HARVEST ABDOMINAL FAT GRAFT;  Surgeon: [name], MD;  Location: [facility];  Service: Neurosurgery;  Laterality: Right;  . LUMBAR LAMINECTOMY/DECOMPRESSION MICRODISCECTOMY Left 08/11/2015   Procedure: Left Lumbar four-five  microdiscectomy;  Surgeon: [name], MD;  Location: [facility];  Service: Neurosurgery;  Laterality: Left;  [name] MASTECTOMY  05/2009   bilateral, reconstructive surgery 09/2009  . MASTECTOMY     Bilateral     FAMILY HISTORY :   Family History  Problem Relation Age of Onset  . Mental illness Mother   . Diabetes Mother   . Hypertension Mother   . Hyperlipidemia Mother   . Alzheimer's disease Mother   . Hypertension Father   . Hyperlipidemia Father   . Stroke Father   . Heart attack Father   . Diabetes Father   . Deep vein thrombosis Father   . Colon cancer Father   . Cancer Brother        Lung  . Diabetes Brother   . Diabetes Brother   . Stroke Maternal Grandmother     SOCIAL HISTORY:   Social History   Tobacco Use  . Smoking status: Current Every Day Smoker    Packs/day: 1.00    Years: 54.00    Pack years: 54.00    Types: Cigarettes  . Smokeless tobacco: Never Used  . Tobacco comment: Refused cessation material  Substance Use Topics  . Alcohol use: No  . Drug use: No    ALLERGIES:  is allergic to lamisil [terbinafine] and no known allergies.  MEDICATIONS:  Current Outpatient Medications  Medication Sig Dispense Refill  . atorvastatin (LIPITOR) 40 MG tablet Take 1 tablet (40 mg total) by mouth daily. 90 tablet 1  . ELIQUIS STARTER PACK (ELIQUIS STARTER PACK) 5 MG TABS Take as directed on package: start with two-40m tablets twice daily for 7 days. On day 8, switch to one-543mtablet twice daily. 1 each 0  . glucose blood test strip ONE TOUCH VERIO TEST STRIP   USE ONCE DAILY TO TEST BLOOD SUGARS  E 11.9 100 each 3  . levothyroxine (SYNTHROID, LEVOTHROID) 75 MCG tablet Take 1 tablet (75 mcg total) by mouth daily. 90 tablet 1  . lisinopril (PRINIVIL,ZESTRIL) 40 MG tablet Take 0.5 tablets (20 mg total) by mouth daily. 45 tablet 5  . nitroGLYCERIN (NITROSTAT) 0.4 MG SL tablet Place 1 tablet (0.4 mg total) under the tongue every 5 (five) minutes as needed for  chest pain. 30 tablet 3  . omeprazole (PRILOSEC) 40 MG capsule TAKE 1 CAPSULE BY MOUTH DAILY 90 capsule 1  . ONETOUCH DELICA LANCETS FINE MISC 1 application by Does not apply route daily. 100 each 2  . venlafaxine XR (EFFEXOR-XR) 150 MG 24 hr capsule TAKE 1 CAPSULE BY MOUTH EVERY DAY 30 capsule 0  . aspirin EC 81 MG tablet Take 81 mg by mouth daily.    . Marland KitchenOPINIRole (REQUIP) 0.25 MG tablet TAKE 1 TABLET (0.25 MG TOTAL) BY MOUTH AT BEDTIME. MAY INCREASE WEEKLY AS NEEDED FOR RLS (Patient not taking: Reported on 04/13/2018) 90 tablet 1   No current facility-administered medications for this visit.     PHYSICAL EXAMINATION: ECOG PERFORMANCE STATUS: 0 - Asymptomatic  BP 112/70 (BP Location: Left Arm, Patient Position: Sitting)   Pulse 83   Temp 98.1 F (36.7 C) (Tympanic)   Resp 16   Wt 192 lb 6.4 oz (87.3 kg)   BMI 31.05 kg/m   Filed Weights   04/13/18 1444  Weight: 192 lb 6.4 oz (87.3 kg)   Physical Exam  Constitutional: She is oriented to person, place, and time and well-developed, well-nourished, and in no distress.  HENT:  Head: Normocephalic and atraumatic.  Mouth/Throat: Oropharynx is clear and moist. No oropharyngeal exudate.  Eyes: Pupils are equal, round, and reactive to light.  Neck: Normal range of motion. Neck supple.  Cardiovascular: Normal rate and regular rhythm.  Pulmonary/Chest: No respiratory distress. She has no wheezes.  Abdominal: Soft. Bowel sounds are normal. She exhibits no distension and no mass. There is no tenderness. There is no rebound and no guarding.  Musculoskeletal: Normal range of motion. She exhibits no edema or tenderness.  Neurological: She is alert and oriented to person, place, and time.  Skin: Skin is warm.  Bilateral mastectomies noted.  Bilateral reconstructed breasts.  No lumps or bumps noted.  Psychiatric: Affect normal.      LABORATORY DATA:  I have reviewed the data as listed    Component Value Date/Time   NA 140 03/15/2018 0923    NA 136 (A) 09/23/2014   NA 140 03/26/2014 1448   K 4.3 03/15/2018 0923   K 4.0 03/26/2014 1448   CL 105 03/15/2018 0923   CL 104 03/26/2014 1448   CO2 30 03/15/2018 0923   CO2 30 03/26/2014 1448   GLUCOSE 116 (H) 03/15/2018 0923   GLUCOSE 91 03/26/2014 1448   BUN 12 03/15/2018 0923   BUN 14 09/23/2014   BUN 19 (H) 03/26/2014 1448   CREATININE 1.01 03/15/2018 0923   CREATININE 1.12 03/26/2014 1448   CALCIUM 9.5 03/15/2018 0923   CALCIUM 9.5 03/26/2014 1448   PROT 6.9 03/15/2018 0923   PROT 6.9 03/26/2014 1448   ALBUMIN 4.1 03/15/2018 0923   ALBUMIN 3.7 03/26/2014 1448   AST 30 03/15/2018 0923   AST 23 03/26/2014 1448   ALT 34 03/15/2018 0923   ALT 35 03/26/2014 1448   ALKPHOS 45 03/15/2018 0923   ALKPHOS 45 (L) 03/26/2014 1448   BILITOT 0.3 03/15/2018 0923   BILITOT 0.3 03/26/2014 1448   GFRNONAA 51 (L) 04/14/2016 1510   GFRNONAA 52 (L) 03/26/2014 1448   GFRAA 59 (L) 04/14/2016 1510   GFRAA 60 (L) 03/26/2014 1448    No results found for: SPEP, UPEP  Lab Results  Component Value Date   WBC 7.9 04/13/2018   NEUTROABS 4.0 04/13/2018   HGB 13.3 04/13/2018   HCT 38.7 04/13/2018   MCV 88.3 04/13/2018   PLT 257 04/13/2018      Chemistry      Component Value Date/Time   NA 140 03/15/2018 0923   NA 136 (A) 09/23/2014   NA 140 03/26/2014 1448   K 4.3 03/15/2018 0923   K 4.0 03/26/2014 1448   CL 105 03/15/2018 0923   CL 104 03/26/2014 1448   CO2 30 03/15/2018 0923   CO2 30 03/26/2014 1448   BUN 12 03/15/2018 0923   BUN 14 09/23/2014   BUN 19 (H) 03/26/2014 1448   CREATININE 1.01 03/15/2018 0923   CREATININE 1.12 03/26/2014 1448   GLU 113 09/23/2014      Component Value Date/Time   CALCIUM 9.5 03/15/2018 0923   CALCIUM 9.5 03/26/2014 1448   ALKPHOS 45 03/15/2018 0923   ALKPHOS 45 (L) 03/26/2014 1448   AST 30 03/15/2018 0923   AST 23 03/26/2014 1448   ALT 34 03/15/2018 0923   ALT 35 03/26/2014 1448   BILITOT 0.3 03/15/2018 0923   BILITOT 0.3 03/26/2014  1448       RADIOGRAPHIC STUDIES: I have personally reviewed the radiological images as listed and agreed with the findings in the report. No results found.   ASSESSMENT & PLAN:  Carcinoma of upper-outer quadrant of left breast in female, estrogen receptor positive ([place]) # Breast cancer stage I ER/PR positive left side status post mastectomy bilateral. Finished letrozole March 2017.  [7 years]. Low risk-oncotype.  # Currently on surveillance.  No evidence of recurrence.  Stable.  # Acute LE DVT/swelling- [US below Knee] on Eliquis/PCP..   # Flushing of face/ sweats- ? Carcinoid syndorme. Check 5HIAA  Urine.   # smoking/LCSP- Nov 2018-wnl.   # Follow-up with [place] in one year./Labs.     Orders Placed This Encounter  Procedures  . 5 HIAA, quantitative, urine, 24 hour    Standing Status:   Future    Standing Expiration Date:   04/13/2019  . CBC with Differential    Standing Status:   Future    Standing Expiration Date:   10/13/2019  . Basic metabolic panel    Standing Status:   Future    Standing Expiration Date:   10/13/2019   All questions were answered. The patient knows to call the clinic with any problems, questions or concerns.      Cammie Sickle, MD 04/15/2018 6:07 PM

## 2018-04-16 DIAGNOSIS — C50412 Malignant neoplasm of upper-outer quadrant of left female breast: Secondary | ICD-10-CM | POA: Diagnosis not present

## 2018-04-16 DIAGNOSIS — Z79899 Other long term (current) drug therapy: Secondary | ICD-10-CM | POA: Diagnosis not present

## 2018-04-16 DIAGNOSIS — Z9013 Acquired absence of bilateral breasts and nipples: Secondary | ICD-10-CM | POA: Diagnosis not present

## 2018-04-16 DIAGNOSIS — Z170 Estrogen receptor positive status [ER+]: Secondary | ICD-10-CM | POA: Diagnosis not present

## 2018-04-16 DIAGNOSIS — Z7901 Long term (current) use of anticoagulants: Secondary | ICD-10-CM | POA: Diagnosis not present

## 2018-04-16 DIAGNOSIS — I82402 Acute embolism and thrombosis of unspecified deep veins of left lower extremity: Secondary | ICD-10-CM | POA: Diagnosis not present

## 2018-04-17 ENCOUNTER — Other Ambulatory Visit: Payer: Self-pay

## 2018-04-17 DIAGNOSIS — E340 Carcinoid syndrome: Secondary | ICD-10-CM

## 2018-04-20 LAB — 5 HIAA, QUANTITATIVE, URINE, 24 HOUR
5-HIAA, Ur: 3.1 mg/L
5-HIAA,QUANT.,24 HR URINE: 4 mg/(24.h) (ref 0.0–14.9)
Total Volume: 1300

## 2018-04-26 ENCOUNTER — Telehealth: Payer: Self-pay | Admitting: Internal Medicine

## 2018-04-26 NOTE — Telephone Encounter (Signed)
Mychart message sent.

## 2018-05-07 ENCOUNTER — Telehealth: Payer: Self-pay | Admitting: Internal Medicine

## 2018-05-07 DIAGNOSIS — Z1283 Encounter for screening for malignant neoplasm of skin: Secondary | ICD-10-CM | POA: Diagnosis not present

## 2018-05-07 DIAGNOSIS — D1800 Hemangioma unspecified site: Secondary | ICD-10-CM | POA: Diagnosis not present

## 2018-05-07 DIAGNOSIS — L821 Other seborrheic keratosis: Secondary | ICD-10-CM | POA: Diagnosis not present

## 2018-05-07 DIAGNOSIS — D692 Other nonthrombocytopenic purpura: Secondary | ICD-10-CM | POA: Diagnosis not present

## 2018-05-07 DIAGNOSIS — I788 Other diseases of capillaries: Secondary | ICD-10-CM | POA: Diagnosis not present

## 2018-05-07 DIAGNOSIS — L578 Other skin changes due to chronic exposure to nonionizing radiation: Secondary | ICD-10-CM | POA: Diagnosis not present

## 2018-05-07 DIAGNOSIS — Z85828 Personal history of other malignant neoplasm of skin: Secondary | ICD-10-CM | POA: Diagnosis not present

## 2018-05-07 DIAGNOSIS — D239 Other benign neoplasm of skin, unspecified: Secondary | ICD-10-CM | POA: Diagnosis not present

## 2018-05-07 DIAGNOSIS — L219 Seborrheic dermatitis, unspecified: Secondary | ICD-10-CM | POA: Diagnosis not present

## 2018-05-07 DIAGNOSIS — L820 Inflamed seborrheic keratosis: Secondary | ICD-10-CM | POA: Diagnosis not present

## 2018-05-07 MED ORDER — OMEPRAZOLE 40 MG PO CPDR: 40.0000 mg | DELAYED_RELEASE_CAPSULE | Freq: Every day | ORAL | 2 refills | Status: DC

## 2018-05-07 MED ORDER — VENLAFAXINE HCL ER 150 MG PO CP24: ORAL_CAPSULE | ORAL | 2 refills | Status: DC

## 2018-05-07 MED ORDER — LEVOTHYROXINE SODIUM 75 MCG PO TABS: 75.0000 ug | ORAL_TABLET | Freq: Every day | ORAL | 2 refills | Status: DC

## 2018-05-07 MED ORDER — ATORVASTATIN CALCIUM 40 MG PO TABS: 40.0000 mg | ORAL_TABLET | Freq: Every day | ORAL | 2 refills | Status: DC

## 2018-05-07 MED ORDER — LISINOPRIL 40 MG PO TABS: 20.0000 mg | ORAL_TABLET | Freq: Every day | ORAL | 2 refills | Status: DC

## 2018-05-07 NOTE — Telephone Encounter (Signed)
Copied from [place] [id number]. Topic: Quick Communication - Rx Refill/Question >> May 07, 2018  9:36 AM Rutherford Nail, NT wrote: **Patient calling and states that she is changing pharmacies and is needing refills on all her medications. States that she has been out of her Lisinopril x 3 days.**  Medication: lisinopril (PRINIVIL,ZESTRIL) 40 MG tablet  omeprazole (PRILOSEC) 40 MG capsule  venlafaxine XR (EFFEXOR-XR) 150 MG 24 hr capsule atorvastatin (LIPITOR) 40 MG tablet  levothyroxine (SYNTHROID, LEVOTHROID) 75 MCG tablet  Has the patient contacted their pharmacy? No. Patient is changing pharmacies (Agent: If no, request that the patient contact the pharmacy for the refill.) (Agent: If yes, when and what did the pharmacy advise?)  Preferred Pharmacy (with phone number or street name): [id number] - [place], [place]: Please be advised that RX refills may take up to 3 business days. We ask that you follow-up with your pharmacy.

## 2018-05-10 ENCOUNTER — Encounter (INDEPENDENT_AMBULATORY_CARE_PROVIDER_SITE_OTHER): Payer: Self-pay | Admitting: Nurse Practitioner

## 2018-05-10 ENCOUNTER — Ambulatory Visit (INDEPENDENT_AMBULATORY_CARE_PROVIDER_SITE_OTHER): Payer: Medicare Other | Admitting: Nurse Practitioner

## 2018-05-10 ENCOUNTER — Ambulatory Visit (INDEPENDENT_AMBULATORY_CARE_PROVIDER_SITE_OTHER): Payer: Medicare Other

## 2018-05-10 VITALS — BP 119/74 | HR 87 | Resp 16 | Ht 66.5 in | Wt 194.0 lb

## 2018-05-10 DIAGNOSIS — F17210 Nicotine dependence, cigarettes, uncomplicated: Secondary | ICD-10-CM

## 2018-05-10 DIAGNOSIS — I824Z2 Acute embolism and thrombosis of unspecified deep veins of left distal lower extremity: Secondary | ICD-10-CM

## 2018-05-10 DIAGNOSIS — J439 Emphysema, unspecified: Secondary | ICD-10-CM | POA: Diagnosis not present

## 2018-05-10 DIAGNOSIS — E119 Type 2 diabetes mellitus without complications: Secondary | ICD-10-CM

## 2018-05-14 ENCOUNTER — Encounter (INDEPENDENT_AMBULATORY_CARE_PROVIDER_SITE_OTHER): Payer: Self-pay | Admitting: Nurse Practitioner

## 2018-05-14 NOTE — Progress Notes (Signed)
Subjective:    Patient ID: Crystal Meyers, female    DOB: [birth year], 72 y.o.   MRN: [id number] Chief Complaint  Patient presents with  . Follow-up    6 week LLE DVT check    HPI  Crystal Meyers is a 72 y.o. female presents today for follow-up of left lower extremity DVT.  The DVT was initially identified at [facility] by duplex ultrasound on 03/21/2018.  She had evidence of thrombus in the posterior tibial and peroneal veins.  The initial symptoms were pain and swelling in the lower extremity.  She was placed on Eliquis for anticoagulation.  Today, her symptoms are resolved.  She states that she is out of Eliquis.  She denies any pain on ambulation or with prolonged standing.  She denies any swelling of the lower extremity.  She utilized medical grade 1 compression therapy to assist with swelling.  She denies any shortness of breath or chest pain.  No cough or hemoptysis.  No issues with extended bleeding such as bleeding per rectum or nosebleeds.  Today duplex ultrasound of the left lower extremity reveals no evidence of DVT in the lower extremity.  No evidence of superficial venous thrombosis.  Past Medical History:  Diagnosis Date  . Barrett's esophagus   . Breast cancer ([place]) 7/10   left , invasive lobular carcinoma  . Complication of anesthesia    difficulty waking up and sleep apnea, low sats  . COPD (chronic obstructive pulmonary disease) ([place])   . Coronary artery disease   . Depression   . GERD (gastroesophageal reflux disease)   . Hypercholesteremia   . Hypertension   . Hypothyroidism   . Personal history of tobacco use, presenting hazards to health 05/06/2015  . Personal history of tobacco use, presenting hazards to health 05/06/2015  . Pneumonia    hx of  . PVC (premature ventricular contraction)   . Sleep apnea    wears CPAP nightly  . Trigeminal neuralgia     Past Surgical History:  Procedure Laterality Date  . BREAST SURGERY Bilateral 2011   dbl mastectomy  .  COLONOSCOPY W/ POLYPECTOMY    . CRANIECTOMY Right 01/21/2016   Procedure: Microvascular Decompression - right trigemminal nerve;  Surgeon: [name], MD;  Location: [place];  Service: Neurosurgery;  Laterality: Right;  . CRANIOTOMY Right 02/03/2016   Procedure: REPAIR OF CEREBROSPINAL FLUID LEAK AND HARVEST ABDOMINAL FAT GRAFT;  Surgeon: [name], MD;  Location: [place];  Service: Neurosurgery;  Laterality: Right;  . LUMBAR LAMINECTOMY/DECOMPRESSION MICRODISCECTOMY Left 08/11/2015   Procedure: Left Lumbar four-five microdiscectomy;  Surgeon: [name], MD;  Location: [place];  Service: Neurosurgery;  Laterality: Left;  [name] MASTECTOMY  05/2009   bilateral, reconstructive surgery 09/2009  . MASTECTOMY     Bilateral     Social History   Socioeconomic History  . Marital status: Widowed    Spouse name: Not on file  . Number of children: Not on file  . Years of education: Not on file  . Highest education level: Not on file  Occupational History  . Not on file  Social Needs  . Financial resource strain: Not hard at all  . Food insecurity:    Worry: Never true    Inability: Never true  . Transportation needs:    Medical: No    Non-medical: No  Tobacco Use  . Smoking status: Current Every Day Smoker    Packs/day: 1.00    Years: 54.00  Pack years: 54.00    Types: Cigarettes  . Smokeless tobacco: Never Used  . Tobacco comment: Refused cessation material  Substance and Sexual Activity  . Alcohol use: No  . Drug use: No  . Sexual activity: Never  Lifestyle  . Physical activity:    Days per week: 0 days    Minutes per session: Not on file  . Stress: Not on file  Relationships  . Social connections:    Talks on phone: Not on file    Gets together: Not on file    Attends religious service: Not on file    Active member of club or organization: Not on file    Attends meetings of clubs or organizations: Not on file    Relationship status: Not on  file  . Intimate partner violence:    Fear of current or ex partner: Not on file    Emotionally abused: Not on file    Physically abused: Not on file    Forced sexual activity: Not on file  Other Topics Concern  . Not on file  Social History Narrative   Lives with mother.   Recently widowed.   Always uses seat belts   Has a bird and a dog.   No exercise.    Family History  Problem Relation Age of Onset  . Mental illness Mother   . Diabetes Mother   . Hypertension Mother   . Hyperlipidemia Mother   . Alzheimer's disease Mother   . Hypertension Father   . Hyperlipidemia Father   . Stroke Father   . Heart attack Father   . Diabetes Father   . Deep vein thrombosis Father   . Colon cancer Father   . Cancer Brother        Lung  . Diabetes Brother   . Diabetes Brother   . Stroke Maternal Grandmother     Allergies  Allergen Reactions  . Lamisil [Terbinafine] Rash  . No Known Allergies Rash     Review of Systems   Review of Systems: Negative Unless Checked Constitutional: [] Weight loss  [] Fever  [] Chills Cardiac: [] Chest pain   []  Atrial Fibrillation  [] Palpitations   [] Shortness of breath when laying flat   [] Shortness of breath with exertion. Vascular:  [] Pain in legs with walking   [] Pain in legs with standing  [x] History of DVT   [] Phlebitis   [] Swelling in legs   [] Varicose veins   [] Non-healing ulcers Pulmonary:   [] Uses home oxygen   [] Productive cough   [] Hemoptysis   [] Wheeze  [] COPD   [] Asthma Neurologic:  [] Dizziness   [] Seizures   [] History of stroke   [] History of TIA  [] Aphasia   [] Vissual changes   [] Weakness or numbness in arm   [] Weakness or numbness in leg Musculoskeletal:   [] Joint swelling   [] Joint pain   [] Low back pain  []  History of Knee Replacement Hematologic:  [] Easy bruising  [] Easy bleeding   [] Hypercoagulable state   [] Anemic Gastrointestinal:  [] Diarrhea   [] Vomiting  [] Gastroesophageal reflux/heartburn   [] Difficulty  swallowing. Genitourinary:  [] Chronic kidney disease   [] Difficult urination  [] Anuric   [] Blood in urine Skin:  [] Rashes   [] Ulcers  Psychological:  [] History of anxiety   []  History of major depression  []  Memory Difficulties     Objective:   Physical Exam  BP 119/74 (BP Location: Right Arm, Patient Position: Sitting)   Pulse 87   Resp 16   Ht 5' 6.5" (1.689 m)  Wt 194 lb (88 kg)   BMI 30.84 kg/m   Gen: WD/WN, NAD Head: [place]/AT, No temporalis wasting.  Ear/Nose/Throat: Hearing grossly intact, nares w/o erythema or drainage Eyes: PER, EOMI, sclera nonicteric.  Neck: Supple, no masses.  No JVD.  Pulmonary:  Good air movement, no use of accessory muscles.  Cardiac: RRR Vascular:  Vessel Right Left  Radial Palpable Palpable   Gastrointestinal: soft, non-distended. No guarding/no peritoneal signs.  Musculoskeletal: M/S 5/5 throughout.  No deformity or atrophy.  Neurologic: Pain and light touch intact in extremities.  Symmetrical.  Speech is fluent. Motor exam as listed above. Psychiatric: Judgment intact, Mood & affect appropriate for pt's clinical situation. Dermatologic: No Venous rashes. No Ulcers Noted.  No changes consistent with cellulitis. Lymph : No Cervical lymphadenopathy, no lichenification or skin changes of chronic lymphedema.      Assessment & Plan:   1. Acute deep vein thrombosis (DVT) of distal end of left lower extremity ([place]) Patient has no evidence of DVT in her left lower extremity.  Patient may stop Eliquis.  Patient has a family history of DVTs.  It is questionable whether she has a genetic hypercoagulable disorder.  I have had a discussion with the patient regarding her possible hypercoagulable status and what these could mean for future DVTs and consequences of them. The patient is unsure if she wishes to proceed.  I have placed orders for a hypercoagulable panel, which can be done at the patient's convenience if she chooses to obtain them.    2.  Pulmonary emphysema, unspecified emphysema type ([place]) Continue pulmonary medications and aerosols as already ordered, these medications have been reviewed and there are no changes at this time.    3. Diabetes mellitus without complication ([place]) Continue hypoglycemic medications as already ordered, these medications have been reviewed and there are no changes at this time.  Hgb A1C to be monitored as already arranged by primary service    Current Outpatient Medications on File Prior to Visit  Medication Sig Dispense Refill  . aspirin EC 81 MG tablet Take 81 mg by mouth daily.    [name] atorvastatin (LIPITOR) 40 MG tablet Take 1 tablet (40 mg total) by mouth daily. 90 tablet 2  . levothyroxine (SYNTHROID, LEVOTHROID) 75 MCG tablet Take 1 tablet (75 mcg total) by mouth daily. 90 tablet 2  . lisinopril (PRINIVIL,ZESTRIL) 40 MG tablet Take 0.5 tablets (20 mg total) by mouth daily. 45 tablet 2  . nitroGLYCERIN (NITROSTAT) 0.4 MG SL tablet Place 1 tablet (0.4 mg total) under the tongue every 5 (five) minutes as needed for chest pain. 30 tablet 3  . omeprazole (PRILOSEC) 40 MG capsule Take 1 capsule (40 mg total) by mouth daily. 90 capsule 2  . ONETOUCH DELICA LANCETS FINE MISC 1 application by Does not apply route daily. 100 each 2  . rOPINIRole (REQUIP) 0.25 MG tablet TAKE 1 TABLET (0.25 MG TOTAL) BY MOUTH AT BEDTIME. MAY INCREASE WEEKLY AS NEEDED FOR RLS 90 tablet 1  . venlafaxine XR (EFFEXOR-XR) 150 MG 24 hr capsule TAKE 1 CAPSULE BY MOUTH EVERY DAY 90 capsule 2  . ELIQUIS STARTER PACK (ELIQUIS STARTER PACK) 5 MG TABS Take as directed on package: start with two-5mg  tablets twice daily for 7 days. On day 8, switch to one-5mg  tablet twice daily. (Patient not taking: Reported on 05/10/2018) 1 each 0  . glucose blood test strip ONE TOUCH VERIO TEST STRIP   USE ONCE DAILY TO TEST BLOOD SUGARS  E 11.9 (Patient  not taking: Reported on 05/10/2018) 100 each 3   No current facility-administered medications on  file prior to visit.     There are no Patient Instructions on file for this visit. Return if symptoms worsen or fail to improve.   Kris Hartmann, NP  This note was completed with Sales executive.  Any errors are purely unintentional.

## 2018-05-19 ENCOUNTER — Telehealth: Payer: Self-pay

## 2018-05-19 NOTE — Telephone Encounter (Signed)
Call pt regarding lung screening. Pt is a current smoker, smoking about 1 pack per day. She would like any Tuesday afternoon. Pt has no new medical issues.

## 2018-05-22 ENCOUNTER — Telehealth: Payer: Self-pay | Admitting: *Deleted

## 2018-05-22 DIAGNOSIS — Z87891 Personal history of nicotine dependence: Secondary | ICD-10-CM

## 2018-05-22 DIAGNOSIS — Z122 Encounter for screening for malignant neoplasm of respiratory organs: Secondary | ICD-10-CM

## 2018-05-22 NOTE — Telephone Encounter (Signed)
Patient has been notified that annual lung cancer screening low dose CT scan is due currently or will be in near future. Confirmed that patient is within the age range of 55-77, and asymptomatic, (no signs or symptoms of lung cancer). Patient denies illness that would prevent curative treatment for lung cancer if found. Verified smoking history, (current, 74 pack year). The shared decision making visit was done 04/17/14. Patient is agreeable for CT scan being scheduled.

## 2018-06-05 ENCOUNTER — Ambulatory Visit: Admission: RE | Admit: 2018-06-05 | Payer: Medicare Other | Source: Ambulatory Visit

## 2018-06-05 ENCOUNTER — Ambulatory Visit
Admission: RE | Admit: 2018-06-05 | Discharge: 2018-06-05 | Disposition: A | Discharge status: Home / Self Care | Payer: Medicare Other | Source: Ambulatory Visit | Attending: Nurse Practitioner | Admitting: Nurse Practitioner

## 2018-06-05 DIAGNOSIS — Z87891 Personal history of nicotine dependence: Secondary | ICD-10-CM | POA: Insufficient documentation

## 2018-06-05 DIAGNOSIS — Z122 Encounter for screening for malignant neoplasm of respiratory organs: Secondary | ICD-10-CM

## 2018-06-05 DIAGNOSIS — F17210 Nicotine dependence, cigarettes, uncomplicated: Secondary | ICD-10-CM | POA: Diagnosis not present

## 2018-06-11 ENCOUNTER — Encounter: Payer: Self-pay | Admitting: *Deleted

## 2018-06-12 DIAGNOSIS — H02132 Senile ectropion of right lower eyelid: Secondary | ICD-10-CM | POA: Diagnosis not present

## 2018-06-12 DIAGNOSIS — E1140 Type 2 diabetes mellitus with diabetic neuropathy, unspecified: Secondary | ICD-10-CM | POA: Diagnosis not present

## 2018-06-12 DIAGNOSIS — Z79890 Hormone replacement therapy (postmenopausal): Secondary | ICD-10-CM | POA: Diagnosis not present

## 2018-06-12 DIAGNOSIS — H53453 Other localized visual field defect, bilateral: Secondary | ICD-10-CM | POA: Diagnosis not present

## 2018-06-12 DIAGNOSIS — J449 Chronic obstructive pulmonary disease, unspecified: Secondary | ICD-10-CM | POA: Diagnosis not present

## 2018-06-12 DIAGNOSIS — Z79899 Other long term (current) drug therapy: Secondary | ICD-10-CM | POA: Diagnosis not present

## 2018-06-12 DIAGNOSIS — Z86718 Personal history of other venous thrombosis and embolism: Secondary | ICD-10-CM | POA: Diagnosis not present

## 2018-06-12 DIAGNOSIS — E039 Hypothyroidism, unspecified: Secondary | ICD-10-CM | POA: Diagnosis not present

## 2018-06-12 DIAGNOSIS — F329 Major depressive disorder, single episode, unspecified: Secondary | ICD-10-CM | POA: Diagnosis not present

## 2018-06-12 DIAGNOSIS — H5340 Unspecified visual field defects: Secondary | ICD-10-CM | POA: Diagnosis not present

## 2018-06-12 DIAGNOSIS — Z883 Allergy status to other anti-infective agents status: Secondary | ICD-10-CM | POA: Diagnosis not present

## 2018-06-12 DIAGNOSIS — L821 Other seborrheic keratosis: Secondary | ICD-10-CM | POA: Diagnosis not present

## 2018-06-12 DIAGNOSIS — I10 Essential (primary) hypertension: Secondary | ICD-10-CM | POA: Diagnosis not present

## 2018-06-12 DIAGNOSIS — H02831 Dermatochalasis of right upper eyelid: Secondary | ICD-10-CM | POA: Diagnosis not present

## 2018-06-12 DIAGNOSIS — F17210 Nicotine dependence, cigarettes, uncomplicated: Secondary | ICD-10-CM | POA: Diagnosis not present

## 2018-06-12 DIAGNOSIS — F411 Generalized anxiety disorder: Secondary | ICD-10-CM | POA: Diagnosis not present

## 2018-06-12 DIAGNOSIS — G4733 Obstructive sleep apnea (adult) (pediatric): Secondary | ICD-10-CM | POA: Diagnosis not present

## 2018-06-12 DIAGNOSIS — E669 Obesity, unspecified: Secondary | ICD-10-CM | POA: Diagnosis not present

## 2018-06-12 DIAGNOSIS — H02834 Dermatochalasis of left upper eyelid: Secondary | ICD-10-CM | POA: Diagnosis not present

## 2018-06-12 DIAGNOSIS — Z853 Personal history of malignant neoplasm of breast: Secondary | ICD-10-CM | POA: Diagnosis not present

## 2018-06-12 DIAGNOSIS — K219 Gastro-esophageal reflux disease without esophagitis: Secondary | ICD-10-CM | POA: Diagnosis not present

## 2018-06-12 DIAGNOSIS — H02135 Senile ectropion of left lower eyelid: Secondary | ICD-10-CM | POA: Diagnosis not present

## 2018-06-12 DIAGNOSIS — M1990 Unspecified osteoarthritis, unspecified site: Secondary | ICD-10-CM | POA: Diagnosis not present

## 2018-07-19 ENCOUNTER — Telehealth: Payer: Self-pay | Admitting: Internal Medicine

## 2018-07-19 NOTE — Telephone Encounter (Signed)
Copied from [place] [phone]. Topic: Quick Communication - See Telephone Encounter >> Jul 19, 2018  5:03 PM Blase Mess A wrote: CRM for notification. See Telephone encounter for: 07/19/18.  [name] is calling to requesting a script for a cough.  Patient was offered an appt on Tuesday 07/24/18 Patient declined. [id number] [address], [place] [phone] (Phone) [phone] (Fax)

## 2018-07-25 DIAGNOSIS — Z9889 Other specified postprocedural states: Secondary | ICD-10-CM | POA: Diagnosis not present

## 2018-07-26 NOTE — Telephone Encounter (Signed)
Pt called back. °

## 2018-07-26 NOTE — Telephone Encounter (Signed)
LMTCB. Please transfer pt to our office.  

## 2018-07-27 MED ORDER — BENZONATATE 200 MG PO CAPS: 200.0000 mg | ORAL_CAPSULE | Freq: Three times a day (TID) | ORAL | 1 refills | Status: DC | PRN

## 2018-07-27 NOTE — Telephone Encounter (Signed)
[facility]  Needs to be seen for anything else.

## 2018-07-27 NOTE — Telephone Encounter (Signed)
Spoke with pt and informed her of the medication that Dr. Derrel Nip has sent in for her. Pt gave a verbal understanding.

## 2018-07-27 NOTE — Telephone Encounter (Signed)
Spoke with pt and she stated that about two weeks ago she came down with the "crud" that everybody is getting. She took mucinex for about two weeks, then stopped taking it because she was getting worried about her blood pressure. The pt stated that she is still coughing and would like to see about getting something for the cough.

## 2018-08-06 DIAGNOSIS — L821 Other seborrheic keratosis: Secondary | ICD-10-CM | POA: Diagnosis not present

## 2018-08-06 DIAGNOSIS — D487 Neoplasm of uncertain behavior of other specified sites: Secondary | ICD-10-CM | POA: Diagnosis not present

## 2018-08-14 DIAGNOSIS — H25813 Combined forms of age-related cataract, bilateral: Secondary | ICD-10-CM | POA: Diagnosis not present

## 2018-08-14 DIAGNOSIS — H04123 Dry eye syndrome of bilateral lacrimal glands: Secondary | ICD-10-CM | POA: Diagnosis not present

## 2018-08-14 DIAGNOSIS — H25812 Combined forms of age-related cataract, left eye: Secondary | ICD-10-CM | POA: Diagnosis not present

## 2018-08-29 DIAGNOSIS — Z9989 Dependence on other enabling machines and devices: Secondary | ICD-10-CM | POA: Diagnosis not present

## 2018-08-29 DIAGNOSIS — Z883 Allergy status to other anti-infective agents status: Secondary | ICD-10-CM | POA: Diagnosis not present

## 2018-08-29 DIAGNOSIS — F411 Generalized anxiety disorder: Secondary | ICD-10-CM | POA: Diagnosis not present

## 2018-08-29 DIAGNOSIS — F329 Major depressive disorder, single episode, unspecified: Secondary | ICD-10-CM | POA: Diagnosis not present

## 2018-08-29 DIAGNOSIS — Z9889 Other specified postprocedural states: Secondary | ICD-10-CM | POA: Diagnosis not present

## 2018-08-29 DIAGNOSIS — I10 Essential (primary) hypertension: Secondary | ICD-10-CM | POA: Diagnosis not present

## 2018-08-29 DIAGNOSIS — I872 Venous insufficiency (chronic) (peripheral): Secondary | ICD-10-CM | POA: Diagnosis not present

## 2018-08-29 DIAGNOSIS — Z79899 Other long term (current) drug therapy: Secondary | ICD-10-CM | POA: Diagnosis not present

## 2018-08-29 DIAGNOSIS — H25812 Combined forms of age-related cataract, left eye: Secondary | ICD-10-CM | POA: Diagnosis not present

## 2018-08-29 DIAGNOSIS — J449 Chronic obstructive pulmonary disease, unspecified: Secondary | ICD-10-CM | POA: Diagnosis not present

## 2018-08-29 DIAGNOSIS — E1140 Type 2 diabetes mellitus with diabetic neuropathy, unspecified: Secondary | ICD-10-CM | POA: Diagnosis not present

## 2018-08-29 DIAGNOSIS — E119 Type 2 diabetes mellitus without complications: Secondary | ICD-10-CM | POA: Diagnosis not present

## 2018-08-29 DIAGNOSIS — G4733 Obstructive sleep apnea (adult) (pediatric): Secondary | ICD-10-CM | POA: Diagnosis not present

## 2018-08-29 DIAGNOSIS — F17210 Nicotine dependence, cigarettes, uncomplicated: Secondary | ICD-10-CM | POA: Diagnosis not present

## 2018-08-29 DIAGNOSIS — Z86718 Personal history of other venous thrombosis and embolism: Secondary | ICD-10-CM | POA: Diagnosis not present

## 2018-08-29 DIAGNOSIS — E669 Obesity, unspecified: Secondary | ICD-10-CM | POA: Diagnosis not present

## 2018-08-29 DIAGNOSIS — Z853 Personal history of malignant neoplasm of breast: Secondary | ICD-10-CM | POA: Diagnosis not present

## 2018-08-29 DIAGNOSIS — E039 Hypothyroidism, unspecified: Secondary | ICD-10-CM | POA: Diagnosis not present

## 2018-08-29 DIAGNOSIS — K219 Gastro-esophageal reflux disease without esophagitis: Secondary | ICD-10-CM | POA: Diagnosis not present

## 2018-09-03 ENCOUNTER — Ambulatory Visit (INDEPENDENT_AMBULATORY_CARE_PROVIDER_SITE_OTHER): Payer: Medicare Other | Admitting: Podiatry

## 2018-09-03 ENCOUNTER — Encounter: Payer: Self-pay | Admitting: Podiatry

## 2018-09-03 VITALS — BP 152/81 | HR 89

## 2018-09-03 DIAGNOSIS — B351 Tinea unguium: Secondary | ICD-10-CM | POA: Diagnosis not present

## 2018-09-03 DIAGNOSIS — M79674 Pain in right toe(s): Secondary | ICD-10-CM

## 2018-09-03 DIAGNOSIS — M79675 Pain in left toe(s): Secondary | ICD-10-CM | POA: Diagnosis not present

## 2018-09-03 DIAGNOSIS — D689 Coagulation defect, unspecified: Secondary | ICD-10-CM | POA: Diagnosis not present

## 2018-09-03 DIAGNOSIS — E119 Type 2 diabetes mellitus without complications: Secondary | ICD-10-CM | POA: Diagnosis not present

## 2018-09-03 NOTE — Progress Notes (Addendum)
This patient presents to the office for an evaluation of both her great toenails.  She says that years ago she injured her left great toenail and it has regrown and is starting to grow into the corner on the inside border left great toe.  She also says she is starting to have a nail grow into the corner of the right great toe.  She says both these areas have become mildly red and mildly painful.  She denies any history of  drainage from the nail.  No evidence of any redness or swelling noted along the medial borders of both great toes.  Patient presents the office today for evaluation and treatment of her ingrowing toenails.  Patient is diabetic x 1 year.  Vascular  Dorsalis pedis and posterior tibial pulses are palpable  B/L.  Capillary return  WNL.  Temperature gradient is  WNL.  Skin turgor  WNL  Sensorium  Senn Weinstein monofilament wire  WNL. Normal tactile sensation.  Nail Exam  Patient has normal nails with no evidence of bacterial or fungal infection. Patient has marked incurvation medial border hallux nails  B/L.  Yellow discoloration left great toenail.  Orthopedic  Exam  Muscle tone and muscle strength  WNL.  No limitations of motion feet  B/L.  No crepitus or joint effusion noted.  Foot type is unremarkable and digits show no abnormalities.  Bony prominences are unremarkable.  Skin  No open lesions.  Normal skin texture and turgor.  Incurvated Nails  Hallux  B/l  IE.  Debridement of hallux nails  B/L.  Discussed condition with patient.  Discussed conservative treatment vs.  Surgical treatment.  RTC 3 months for preventative footcare services.   Gardiner Barefoot DPM

## 2018-09-13 ENCOUNTER — Ambulatory Visit (INDEPENDENT_AMBULATORY_CARE_PROVIDER_SITE_OTHER): Payer: Medicare Other | Admitting: Internal Medicine

## 2018-09-13 ENCOUNTER — Encounter: Payer: Self-pay | Admitting: Internal Medicine

## 2018-09-13 ENCOUNTER — Ambulatory Visit (INDEPENDENT_AMBULATORY_CARE_PROVIDER_SITE_OTHER): Payer: Medicare Other

## 2018-09-13 VITALS — BP 132/74 | HR 84 | Temp 98.0°F | Resp 15 | Ht 66.0 in | Wt 198.0 lb

## 2018-09-13 DIAGNOSIS — E785 Hyperlipidemia, unspecified: Secondary | ICD-10-CM | POA: Diagnosis not present

## 2018-09-13 DIAGNOSIS — E119 Type 2 diabetes mellitus without complications: Secondary | ICD-10-CM

## 2018-09-13 DIAGNOSIS — R059 Cough, unspecified: Secondary | ICD-10-CM

## 2018-09-13 DIAGNOSIS — E039 Hypothyroidism, unspecified: Secondary | ICD-10-CM

## 2018-09-13 DIAGNOSIS — R05 Cough: Secondary | ICD-10-CM

## 2018-09-13 DIAGNOSIS — J431 Panlobular emphysema: Secondary | ICD-10-CM

## 2018-09-13 DIAGNOSIS — Z720 Tobacco use: Secondary | ICD-10-CM

## 2018-09-13 LAB — COMPREHENSIVE METABOLIC PANEL
ALBUMIN: 4.4 g/dL (ref 3.5–5.2)
ALT: 37 U/L — AB (ref 0–35)
AST: 34 U/L (ref 0–37)
Alkaline Phosphatase: 45 U/L (ref 39–117)
BILIRUBIN TOTAL: 0.4 mg/dL (ref 0.2–1.2)
BUN: 19 mg/dL (ref 6–23)
CALCIUM: 9.8 mg/dL (ref 8.4–10.5)
CO2: 26 mEq/L (ref 19–32)
CREATININE: 1.14 mg/dL (ref 0.40–1.20)
Chloride: 104 mEq/L (ref 96–112)
GFR: 47.08 mL/min — AB (ref >60.00)
Glucose, Bld: 109 mg/dL — ABNORMAL HIGH (ref 70–99)
Potassium: 4.4 mEq/L (ref 3.5–5.1)
Sodium: 140 mEq/L (ref 135–145)
Total Protein: 7.3 g/dL (ref 6.0–8.3)

## 2018-09-13 LAB — HEMOGLOBIN A1C: Hgb A1c MFr Bld: 7 % — ABNORMAL HIGH (ref 4.6–6.5)

## 2018-09-13 LAB — LIPID PANEL
CHOLESTEROL: 145 mg/dL (ref 0–200)
HDL: 49.2 mg/dL (ref >39.00)
LDL Cholesterol: 64 mg/dL (ref 0–99)
NonHDL: 96.23
TRIGLYCERIDES: 159 mg/dL — AB (ref 0.0–149.0)
Total CHOL/HDL Ratio: 3
VLDL: 31.8 mg/dL (ref 0.0–40.0)

## 2018-09-13 LAB — TSH: TSH: 3.3 u[IU]/mL (ref 0.35–4.50)

## 2018-09-13 LAB — MICROALBUMIN / CREATININE URINE RATIO
CREATININE, U: 174.3 mg/dL
MICROALB/CREAT RATIO: 0.5 mg/g (ref 0.0–30.0)
Microalb, Ur: 0.8 mg/dL (ref 0.0–1.9)

## 2018-09-13 MED ORDER — BLOOD GLUCOSE METER KIT: PACK | 0 refills | Status: DC

## 2018-09-13 NOTE — Patient Instructions (Signed)
Chronic Bronchitis  Chronic bronchitis is long-lasting inflammation of the tubes that carry air into your lungs (bronchial tubes). This is inflammation that occurs:  · On most days of the week.  · For at least three months at a time.  · Over a period of two years in a row.  When the bronchial tubes are inflamed, they start to produce mucus. The inflammation and buildup of mucus make it more difficult to breathe. Chronic bronchitis is usually a permanent problem. It is one type of chronic obstructive pulmonary disease (COPD). People with chronic bronchitis are more likely to get frequent colds or respiratory infections.  What are the causes?  Chronic bronchitis most often occurs in people who:  · Have chronic, severe asthma.  · Have a history of smoking.  · Have asthma and smoke.  · Have certain lung diseases.  · Have had long-term exposure to certain irritating fumes or chemicals.  What are the signs or symptoms?  Symptoms of chronic bronchitis may include:  · A cough that brings up mucus (productive cough).  · Shortness of breath.  · Loud breathing (wheezing).  · Chest discomfort.  · Frequent (recurring) colds or respiratory infections.  Certain things can trigger chronic bronchitis symptoms or make them worse, such as:  · Infections.  · Stopping certain medicines.  · Smoking.  · Exposure to chemicals.  How is this diagnosed?  This condition may be diagnosed based on:  · Your symptoms and medical history.  · A physical exam.  · A chest X-ray.  · Lung (pulmonary) function tests.  How is this treated?  There is no cure for chronic bronchitis, but treatment can help control your symptoms. Treatment may include:  · Using a cool mist vaporizer or humidifier to make it easier to breathe.  · Drinking more fluids. Drinking more makes your mucus thinner, which may make it easier to breathe.  · Lifestyle changes, such as eating a healthier diet and getting more exercise.  · Medicines, such as:  ? Inhalers to improve air flow  in and out of your lungs.  ? Antibiotics to treat any bacterial infections you have, such as:  § Lung infection (pneumonia).  § Sinus infection.  § A sudden, severe (acute) episode of bronchitis.  · Oxygen therapy.  · Preventing infections by keeping up to date on vaccinations, including the pneumonia and flu vaccines.  · Pulmonary rehabilitation. This is a program that helps you manage your breathing problems and improve your quality of life. It may last for up to 4-12 weeks and may include exercise programs, education, counseling, and treatment support.  Follow these instructions at home:  Medicines  · Take over-the-counter and prescription medicines only as told by your health care provider.  · If you were prescribed an antibiotic medicine, take it as told by your health care provider. Do not stop taking the antibiotic even if you start to feel better.  Preventing infections  · Get vaccinations as told by your health care provider. Make sure you get a flu shot (influenza vaccine) every year.  · Wash your hands often with soap and water. If soap and water are not available, use hand sanitizer.  · Avoid contact with people who have symptoms of a cold or the flu.  Managing symptoms    · Do not smoke, and avoid secondhand smoke. Exposure to cigarette smoke or irritating chemicals will make bronchitis worse. If you smoke and you need help quitting, ask your health   care provider. Quitting smoking will help your lungs heal faster.  · Use an inhaler, cool mist vaporizer, or humidifier as told by your health care provider.  · Avoid pollen, dust, animal dander, molds, smoke, and other things that cause shortness of breath or wheezing attacks.  · Use oxygen therapy at home as directed. Follow instructions from your health care provider about how to use oxygen safely and take precautions to prevent fire. Make sure you never smoke while using oxygen or allow others to smoke in your home.  · Do not wait to get medical care if  you have any concerning symptoms or trouble breathing. Waiting could cause permanent injury and may be life threatening.  General instructions  · Talk with your health care provider about what activities are safe for you and about possible exercise routines. Regular exercise is very important to help you feel better.  · Drink enough fluids to keep your urine pale yellow.  · Keep all follow-up visits as told by your health care provider. This is important.  Contact a health care provider if:  · You have coughing or shortness of breath that gets worse.  · You have muscle aches.  · You have chest pain.  · Your mucus seems to get thicker.  · Your mucus changes from clear or white to yellow, green, gray, or bloody.  Get help right away if:  · Your usual medicines do not stop your wheezing.  · You have severe difficulty breathing.  These symptoms may represent a serious problem that is an emergency. Do not wait to see if the symptoms will go away. Get medical help right away. Call your local emergency services (911 in the [place] Do not drive yourself to the hospital.  Summary  · Chronic bronchitis is long-lasting inflammation of the tubes that carry air into your lungs (bronchial tubes).  · Chronic bronchitis is usually a permanent problem. It is one type of chronic obstructive pulmonary disease (COPD).  · There is no cure for chronic bronchitis, but treatment can help control your symptoms.  · Do not smoke, and avoid secondhand smoke. Exposure to cigarette smoke or irritating chemicals will make bronchitis worse.  This information is not intended to replace advice given to you by your health care provider. Make sure you discuss any questions you have with your health care provider.  Document Released: 04/14/2006 Document Revised: 05/17/2017 Document Reviewed: 05/17/2017  [employer]vier Interactive Patient Education © 2019 [employer]

## 2018-09-13 NOTE — Progress Notes (Signed)
Subjective:  Patient ID: Crystal Meyers, female    DOB: 03-12-1946  Age: 73 y.o. MRN: [id number]  CC: The primary encounter diagnosis was Cough. Diagnoses of Acquired hypothyroidism, Hyperlipidemia LDL goal <100, Diabetes mellitus without complication ([place]), Panlobular emphysema ([place]), and Tobacco abuse were also pertinent to this visit.  HPI Crystal Meyers presents for 6 month follow up on diet controlled  diabetes.  Patient has had an intermittent lingering  productive cough since December .  She continues to smoke about 3 cigs daily ,  Down from a pack daily in March   She has been having persistent non radiating low back pain  Limiting  her ability to complete household chores.  Had back surgery 2017  That included an L4-5 laminotomy and microdiscectomy.  Not exercising , can't afford PT because she is still supporting her grandchildren who are living in her family home .  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   Patient is following a low glycemic index diet and taking all prescribed medications regularly without side effects.  Fasting sugars have been under less than 140 most of the time and post prandials have been under 160 except on rare occasions. Patient is exercising about 3 times per week and intentionally trying to lose weight .  Patient has had an eye exam in the last 12 months and checks feet regularly for signs of infection.  Patient does not walk barefoot outside, and seeing Crystal Meyers,  podiatry for toenail issue (left great toenail has not been right since it was bitten by a parrot years ago ) And denies an numbness tingling or burning in feet. Patient is up to date on all recommended vaccinations. Patient is still smoking   Outpatient Medications Prior to Visit  Medication Sig Dispense Refill  . aspirin EC 81 MG tablet Take 81 mg by mouth daily.    [name] atorvastatin  (LIPITOR) 40 MG tablet Take by mouth.    [name] ketorolac (ACULAR) 0.5 % ophthalmic solution Apply to eye.    . levothyroxine (SYNTHROID, LEVOTHROID) 75 MCG tablet 1 tablet every morning    . lisinopril (PRINIVIL,ZESTRIL) 40 MG tablet Take by mouth.    . nitroGLYCERIN (NITROSTAT) 0.4 MG SL tablet Place under the tongue.    [name] omeprazole (PRILOSEC) 40 MG capsule 1 capsule every morning    . prednisoLONE acetate (PRED FORTE) 1 % ophthalmic suspension     . rOPINIRole (REQUIP) 0.25 MG tablet TAKE 1 TABLET (0.25 MG TOTAL) BY MOUTH AT BEDTIME. MAY INCREASE WEEKLY AS NEEDED FOR RLS    . venlafaxine XR (EFFEXOR-XR) 150 MG 24 hr capsule 1 capsule every morning    . benzonatate (TESSALON) 200 MG capsule Take 1 capsule (200 mg total) by mouth 3 (three) times daily as needed for cough. (Patient not taking: Reported on 09/13/2018) 60 capsule 1  . ELIQUIS STARTER PACK (ELIQUIS STARTER PACK) 5 MG TABS Take as directed on package: start with two-13m tablets twice daily for 7 days. On day 8, switch to one-530mtablet twice daily. (Patient not taking: Reported on 09/13/2018) 1 each 0  . erythromycin ophthalmic ointment PLACE INTO BOTH EYES 3 (THREE) TIMES DAILY    . glucose blood test strip ONE TOUCH VERIO TEST STRIP   USE ONCE DAILY TO TEST BLOOD SUGARS  E 11.9 (Patient not taking: Reported on 09/13/2018) 100 each 3  . moxifloxacin (VIGAMOX) 0.5 %  ophthalmic solution INSTILL 1 DROP INTO BOTH EYES 4 TIMES A DAY    . ofloxacin (OCUFLOX) 0.3 % ophthalmic solution     . ondansetron (ZOFRAN) 4 MG tablet Take 4 mg by mouth every 8 (eight) hours as needed. for nausea    . ONETOUCH DELICA LANCETS FINE MISC 1 application by Does not apply route daily. (Patient not taking: Reported on 09/13/2018) 100 each 2  . oxyCODONE (OXY IR/ROXICODONE) 5 MG immediate release tablet Take by mouth.     No facility-administered medications prior to visit.     Review of Systems;  Patient denies headache, fevers, malaise, unintentional weight loss,  skin rash, eye pain, sinus congestion and sinus pain, sore throat, dysphagia,  hemoptysis , cough, dyspnea, wheezing, chest pain, palpitations, orthopnea, edema, abdominal pain, nausea, melena, diarrhea, constipation, flank pain, dysuria, hematuria, urinary  Frequency, nocturia, numbness, tingling, seizures,  Focal weakness, Loss of consciousness,  Tremor, insomnia, depression, anxiety, and suicidal ideation.      Objective:  BP 132/74 (BP Location: Left Arm, Patient Position: Sitting, Cuff Size: Large)   Pulse 84   Temp 98 F (36.7 C) (Oral)   Resp 15   Ht _0  (1.676 m)   Wt 198 lb (89.8 kg)   SpO2 96%   BMI 31.96 kg/m   BP Readings from Last 3 Encounters:  09/13/18 132/74  09/03/18 (!) 152/81  05/10/18 119/74    Wt Readings from Last 3 Encounters:  09/13/18 198 lb (89.8 kg)  06/05/18 194 lb (88 kg)  05/10/18 194 lb (88 kg)    General appearance: alert, cooperative and appears stated age Ears: normal TM's and external ear canals both ears Throat: lips, mucosa, and tongue normal; teeth and gums normal Neck: no adenopathy, no carotid bruit, supple, symmetrical, trachea midline and thyroid not enlarged, symmetric, no tenderness/mass/nodules Back: symmetric, no curvature. ROM normal. No CVA tenderness. Lungs: clear to auscultation bilaterally Heart: regular rate and rhythm, S1, S2 normal, no murmur, click, rub or gallop Abdomen: soft, non-tender; bowel sounds normal; no masses,  no organomegaly Pulses: 2+ and symmetric Skin: Skin color, texture, turgor normal. No rashes or lesions Lymph nodes: Cervical, supraclavicular, and axillary nodes normal.  Lab Results  Component Value Date   HGBA1C 7.0 (H) 09/13/2018   HGBA1C 6.6 (A) 03/15/2018   HGBA1C 6.5 09/08/2017    Lab Results  Component Value Date   CREATININE 1.14 09/13/2018   CREATININE 1.01 03/15/2018   CREATININE 1.06 09/08/2017    Lab Results  Component Value Date   WBC 7.9 04/13/2018   HGB 13.3 04/13/2018     HCT 38.7 04/13/2018   PLT 257 04/13/2018   GLUCOSE 109 (H) 09/13/2018   CHOL 145 09/13/2018   TRIG 159.0 (H) 09/13/2018   HDL 49.20 09/13/2018   LDLDIRECT 71.0 08/31/2016   LDLCALC 64 09/13/2018   ALT 37 (H) 09/13/2018   AST 34 09/13/2018   NA 140 09/13/2018   K 4.4 09/13/2018   CL 104 09/13/2018   CREATININE 1.14 09/13/2018   BUN 19 09/13/2018   CO2 26 09/13/2018   TSH 3.30 09/13/2018   HGBA1C 7.0 (H) 09/13/2018   MICROALBUR 0.8 09/13/2018    Ct Chest Lung Cancer Screening Low Dose Wo Contrast  Result Date: 06/05/2018 CLINICAL DATA:  Seventy-four pack-year smoking history. Current smoker. EXAM: CT CHEST WITHOUT CONTRAST LOW-DOSE FOR LUNG CANCER SCREENING TECHNIQUE: Multidetector CT imaging of the chest was performed following the standard protocol without IV contrast. COMPARISON:  05/31/2017 FINDINGS: Cardiovascular:  Aortic atherosclerosis. Tortuous thoracic aorta. Normal heart size, without pericardial effusion. Multivessel coronary artery atherosclerosis. Mediastinum/Nodes: No axillary adenopathy. No mediastinal or definite hilar adenopathy, given limitations of unenhanced CT. Lungs/Pleura: No pleural fluid. Mild centrilobular emphysema. Calcified and noncalcified left-sided pulmonary nodules are not significantly changed. No suspicious pulmonary nodule. Upper Abdomen: Mild to moderate hepatic steatosis. Lateral segment left liver lobe low-density lesion is similar and likely a cyst. Normal imaged portions of the spleen, stomach, pancreas, gallbladder, adrenal glands, left kidney. Musculoskeletal: Bilateral breast implants. Cervicothoracic spondylosis. IMPRESSION: 1. Lung-RADS 2, benign appearance or behavior. Continue annual screening with low-dose chest CT without contrast in 12 months. 2. Hepatic steatosis. 3. Aortic atherosclerosis (ICD10-I70.0), coronary artery atherosclerosis and emphysema (ICD10-J43.9). Electronically Signed   By: Abigail Miyamoto M.D.   On: 06/05/2018 15:37     Assessment & Plan:   Problem List Items Addressed This Visit    COPD (chronic obstructive pulmonary disease) with emphysema ([place])    Chest xray done today to evaluate persistent cough. No infiltrates or masses were seen.       Tobacco abuse    Again Urged to quit  Smoking using a gradual reduction .  [name]  Prior intolerance to chantix and wellbutrin.       Hyperlipidemia LDL goal <100    LDL and triglycerides are at goal on current medications. She has no side effects and liver enzymes are normal. No changes today   Lab Results  Component Value Date   CHOL 145 09/13/2018   HDL 49.20 09/13/2018   LDLCALC 64 09/13/2018   LDLDIRECT 71.0 08/31/2016   TRIG 159.0 (H) 09/13/2018   CHOLHDL 3 09/13/2018   Lab Results  Component Value Date   ALT 37 (H) 09/13/2018   AST 34 09/13/2018   ALKPHOS 45 09/13/2018   BILITOT 0.4 09/13/2018         Relevant Orders   Lipid panel (Completed)   Hypothyroidism    Thyroid function is WNL on current dose.  No current changes needed.   Lab Results  Component Value Date   TSH 3.30 09/13/2018         Relevant Orders   TSH (Completed)   Diabetes mellitus without complication ([place])    Currently well-controlled on diet alone . Hemoglobin A1c is at goal of l 7.0 . Patient is reminded to schedule an annual eye exam and foot exam is normal today. Patient has no microalbuminuria. Patient is tolerating statin therapy for CAD risk reduction and on ACE/ARB for renal protection and hypertension.  Lab Results  Component Value Date   HGBA1C 7.0 (H) 09/13/2018   Lab Results  Component Value Date   MICROALBUR 0.8 09/13/2018         Relevant Orders   Hemoglobin A1c (Completed)   Comprehensive metabolic panel (Completed)   Microalbumin / creatinine urine ratio (Completed)    Other Visit Diagnoses    Cough    -  Primary   Relevant Orders   DG Chest 2 View (Completed)    A total of 25 minutes of face to face time was spent with patient  more than half of which was spent in counselling about the above mentioned conditions  and coordination of care   I have discontinued Masae D. Jesus's OneTouch Delica Lancets Fine, glucose blood, Eliquis DVT/PE Starter Pack, benzonatate, moxifloxacin, erythromycin, ofloxacin, ondansetron, and oxyCODONE. I am also having her start on blood glucose meter kit and supplies. Additionally, I am having her maintain her aspirin  EC, ketorolac, prednisoLONE acetate, atorvastatin, levothyroxine, lisinopril, nitroGLYCERIN, omeprazole, rOPINIRole, and venlafaxine XR.  Meds ordered this encounter  Medications  . blood glucose meter kit and supplies    Sig: Dispense based on patient and insurance preference. Use to check sugar once daily as directed. (FOR ICD-10 E10.9, E11.9).    Dispense:  1 each    Refill:  0    Order Specific Question:   Number of strips    Answer:   100    Order Specific Question:   Number of lancets    Answer:   100    Medications Discontinued During This Encounter  Medication Reason  . ELIQUIS STARTER PACK (ELIQUIS STARTER PACK) 5 MG TABS Patient has not taken in last 30 days  . benzonatate (TESSALON) 200 MG capsule Patient has not taken in last 30 days  . erythromycin ophthalmic ointment Patient has not taken in last 30 days  . moxifloxacin (VIGAMOX) 0.5 % ophthalmic solution Patient has not taken in last 30 days  . ofloxacin (OCUFLOX) 0.3 % ophthalmic solution Patient has not taken in last 30 days  . ondansetron (ZOFRAN) 4 MG tablet Patient has not taken in last 30 days  . oxyCODONE (OXY IR/ROXICODONE) 5 MG immediate release tablet Patient has not taken in last 30 days  . glucose blood test strip Patient has not taken in last 30 days  . [place] Patient has not taken in last 30 days    Follow-up: No follow-ups on file.   [name], MD

## 2018-09-15 NOTE — Assessment & Plan Note (Signed)
Again Urged to quit  Smoking using a gradual reduction .  [name]  Prior intolerance to chantix and wellbutrin.

## 2018-09-15 NOTE — Assessment & Plan Note (Signed)
Thyroid function is WNL on current dose.  No current changes needed.   Lab Results  Component Value Date   TSH 3.30 09/13/2018

## 2018-09-15 NOTE — Assessment & Plan Note (Signed)
LDL and triglycerides are at goal on current medications. She has no side effects and liver enzymes are normal. No changes today   Lab Results  Component Value Date   CHOL 145 09/13/2018   HDL 49.20 09/13/2018   LDLCALC 64 09/13/2018   LDLDIRECT 71.0 08/31/2016   TRIG 159.0 (H) 09/13/2018   CHOLHDL 3 09/13/2018   Lab Results  Component Value Date   ALT 37 (H) 09/13/2018   AST 34 09/13/2018   ALKPHOS 45 09/13/2018   BILITOT 0.4 09/13/2018

## 2018-09-15 NOTE — Assessment & Plan Note (Signed)
Chest xray done today to evaluate persistent cough. No infiltrates or masses were seen.

## 2018-09-15 NOTE — Assessment & Plan Note (Signed)
Currently well-controlled on diet alone . Hemoglobin A1c is at goal of l 7.0 . Patient is reminded to schedule an annual eye exam and foot exam is normal today. Patient has no microalbuminuria. Patient is tolerating statin therapy for CAD risk reduction and on ACE/ARB for renal protection and hypertension.  Lab Results  Component Value Date   HGBA1C 7.0 (H) 09/13/2018   Lab Results  Component Value Date   MICROALBUR 0.8 09/13/2018

## 2018-11-26 ENCOUNTER — Other Ambulatory Visit: Payer: Self-pay

## 2018-11-26 ENCOUNTER — Ambulatory Visit (INDEPENDENT_AMBULATORY_CARE_PROVIDER_SITE_OTHER): Payer: Medicare Other | Admitting: Podiatry

## 2018-11-26 ENCOUNTER — Encounter: Payer: Self-pay | Admitting: Podiatry

## 2018-11-26 VITALS — Temp 98.4°F

## 2018-11-26 DIAGNOSIS — E119 Type 2 diabetes mellitus without complications: Secondary | ICD-10-CM | POA: Diagnosis not present

## 2018-11-26 DIAGNOSIS — D689 Coagulation defect, unspecified: Secondary | ICD-10-CM | POA: Diagnosis not present

## 2018-11-26 DIAGNOSIS — M79674 Pain in right toe(s): Secondary | ICD-10-CM

## 2018-11-26 DIAGNOSIS — M79675 Pain in left toe(s): Secondary | ICD-10-CM | POA: Diagnosis not present

## 2018-11-26 DIAGNOSIS — B351 Tinea unguium: Secondary | ICD-10-CM

## 2018-11-26 NOTE — Progress Notes (Addendum)
Complaint:  Visit Type: Patient returns to my office for continued preventative foot care services. Complaint: Patient states" my nails have grown long and thick and become painful to walk and wear shoes" Patient has been diagnosed with DM with no foot complications. The patient presents for preventative foot care services. No changes to ROS  Podiatric Exam: Vascular: dorsalis pedis and posterior tibial pulses are palpable bilateral. Capillary return is immediate. Temperature gradient is WNL. Skin turgor WNL  Sensorium: Normal Semmes Weinstein monofilament test. Normal tactile sensation bilaterally. Nail Exam: Pt has thick disfigured discolored nails with subungual debris noted bilateral entire nail hallux .  Incurvated hallux nails. Ulcer Exam: There is no evidence of ulcer or pre-ulcerative changes or infection. Orthopedic Exam: Muscle tone and strength are WNL. No limitations in general ROM. No crepitus or effusions noted. Foot type and digits show no abnormalities. Functional hallux limitus 1st MPJ  B/L. Skin: No Porokeratosis. No infection or ulcers  Diagnosis:  Onychomycosis, , Pain in right toe, pain in left toes  Treatment & Plan Procedures and Treatment: Consent by patient was obtained for treatment procedures.   Debridement of mycotic and hypertrophic toenails, 1 through 5 bilateral and clearing of subungual debris. No ulceration, no infection noted.  Return Visit-Office Procedure: Patient instructed to return to the office for a follow up visit 3 months for continued evaluation and treatment.    Gardiner Barefoot DPM

## 2018-12-24 ENCOUNTER — Other Ambulatory Visit: Payer: Self-pay

## 2018-12-24 ENCOUNTER — Ambulatory Visit (INDEPENDENT_AMBULATORY_CARE_PROVIDER_SITE_OTHER): Payer: Medicare Other | Admitting: Internal Medicine

## 2018-12-24 ENCOUNTER — Encounter: Payer: Self-pay | Admitting: Internal Medicine

## 2018-12-24 DIAGNOSIS — G4733 Obstructive sleep apnea (adult) (pediatric): Secondary | ICD-10-CM

## 2018-12-24 DIAGNOSIS — I10 Essential (primary) hypertension: Secondary | ICD-10-CM

## 2018-12-24 DIAGNOSIS — E039 Hypothyroidism, unspecified: Secondary | ICD-10-CM | POA: Diagnosis not present

## 2018-12-24 DIAGNOSIS — Z1159 Encounter for screening for other viral diseases: Secondary | ICD-10-CM | POA: Diagnosis not present

## 2018-12-24 DIAGNOSIS — E785 Hyperlipidemia, unspecified: Secondary | ICD-10-CM | POA: Diagnosis not present

## 2018-12-24 DIAGNOSIS — Z01818 Encounter for other preprocedural examination: Secondary | ICD-10-CM | POA: Diagnosis not present

## 2018-12-24 DIAGNOSIS — Z9989 Dependence on other enabling machines and devices: Secondary | ICD-10-CM

## 2018-12-24 DIAGNOSIS — J431 Panlobular emphysema: Secondary | ICD-10-CM | POA: Diagnosis not present

## 2018-12-24 DIAGNOSIS — E119 Type 2 diabetes mellitus without complications: Secondary | ICD-10-CM

## 2018-12-24 DIAGNOSIS — Z86718 Personal history of other venous thrombosis and embolism: Secondary | ICD-10-CM

## 2018-12-24 NOTE — Progress Notes (Signed)
Pt stated that she is getting Covid-19 testing today because she is having cataract surgery on Wednesday.

## 2018-12-24 NOTE — Progress Notes (Signed)
Virtual Visit via [email or web address]  This visit type was conducted due to national recommendations for restrictions regarding the COVID-19 pandemic (e.g. social distancing).  This format is felt to be most appropriate for this patient at this time.  All issues noted in this document were discussed and addressed.  No physical exam was performed (except for noted visual exam findings with Video Visits).   I connected with@ on 12/24/18 at  9:30 AM EDT by a video enabled telemedicine application and verified that I am speaking with the correct person using two identifiers. Location patient: home Location provider: work or home office Persons participating in the virtual visit: patient, provider  I discussed the limitations, risks, security and privacy concerns of performing an evaluation and management service by telephone and the availability of in person appointments. I also discussed with the patient that there may be a patient responsible charge related to this service. The patient expressed understanding and agreed to proceed.   Reason for visit: follow up on type 2 DM, hypertension, and OSA  HPI:  Patient does not check blood sugars .  She does not own a working glucometer.   No complaints today.  Taking his medications as directed,  Not exercising on a regular basis or trying to lose weight.  Patient voices awareness  of the foods he/she needs to avoid,  But has been eating take out at least 5days per week since the epdiemic started and notes that she has gained weight  a weight dain And follows a low GI diet about 50% of the time.  Has cataract surgery scheduled for Wednesday on right eye.  At [facility]   Denies numbness and tingling in lower extremities.  Denies hypoglycemic symptoms.   The patient has no signs or symptoms of COVID 19 infection (fever, cough, sore throat  or shortness of breath beyond what is typical for patient).  She is undergoing testing tomorrow at [facility] in preparation for her cataract  surgery . Patient denies contact with other persons with the above mentioned symptoms or with anyone confirmed to have COVID 19, but her grandchildren who are all unemployed do come and go and she believes they are wearing face masks.   OSA:  She is wearing her self purchased  CPAP but does hypersomnia  And her last study was in 2014      Still smoking .  5 minutes was spent in face to face time discussing patient's current tobacco abuse,  Prior attempts to quit, the current and future hazards to patient's  health,  And assessing and discussing patient's  level of motivation to quit. Patient was encouraged to consider pharmacotherapy.   Patient is taking her medications as prescribed and notes no adverse effects.  Home BP readings have NOT been done  She is not avoiding added salt in her diet or walking regularly  times per week for exercise  .   ROS: See pertinent positives and negatives per HPI.  Past Medical History:  Diagnosis Date  . Barrett's esophagus   . Breast cancer ([place]) 7/10   left , invasive lobular carcinoma  . Complication of anesthesia    difficulty waking up and sleep apnea, low sats  . COPD (chronic obstructive pulmonary disease) ([place])   . Coronary artery disease   . Depression   . GERD (gastroesophageal reflux disease)   . Hypercholesteremia   . Hypertension   . Hypothyroidism   . Personal history of tobacco use, presenting hazards to health 05/06/2015  .  Personal history of tobacco use, presenting hazards to health 05/06/2015  . Pneumonia    hx of  . PVC (premature ventricular contraction)   . Sleep apnea    wears CPAP nightly  . Trigeminal neuralgia     Past Surgical History:  Procedure Laterality Date  . BREAST SURGERY Bilateral 2011   dbl mastectomy  . COLONOSCOPY W/ POLYPECTOMY    . CRANIECTOMY Right 01/21/2016   Procedure: Microvascular Decompression - right trigemminal nerve;  Surgeon: [name], MD;  Location: [place] [facility];  Service:  Neurosurgery;  Laterality: Right;  . CRANIOTOMY Right 02/03/2016   Procedure: REPAIR OF CEREBROSPINAL FLUID LEAK AND HARVEST ABDOMINAL FAT GRAFT;  Surgeon: [name], MD;  Location: [place] [facility];  Service: Neurosurgery;  Laterality: Right;  . LUMBAR LAMINECTOMY/DECOMPRESSION MICRODISCECTOMY Left 08/11/2015   Procedure: Left Lumbar four-five microdiscectomy;  Surgeon: [name], MD;  Location: [place] [facility];  Service: Neurosurgery;  Laterality: Left;  [name] MASTECTOMY  05/2009   bilateral, reconstructive surgery 09/2009  . MASTECTOMY     Bilateral     Family History  Problem Relation Age of Onset  . Mental illness Mother   . Diabetes Mother   . Hypertension Mother   . Hyperlipidemia Mother   . Alzheimer's disease Mother   . Hypertension Father   . Hyperlipidemia Father   . Stroke Father   . Heart attack Father   . Diabetes Father   . Deep vein thrombosis Father   . Colon cancer Father   . Cancer Brother        Lung  . Diabetes Brother   . Diabetes Brother   . Stroke Maternal Grandmother     SOCIAL HX:  reports that she has been smoking cigarettes. She has a 54.00 pack-year smoking history. She has never used smokeless tobacco. She reports that she does not drink alcohol or use drugs.   Current Outpatient Medications:  .  atorvastatin (LIPITOR) 40 MG tablet, Take by mouth., Disp: , Rfl:  .  blood glucose meter kit and supplies, Dispense based on patient and insurance preference. Use to check sugar once daily as directed. (FOR ICD-10 E10.9, E11.9)., Disp: 1 each, Rfl: 0 .  ketorolac (ACULAR) 0.5 % ophthalmic solution, Apply to eye., Disp: , Rfl:  .  levothyroxine (SYNTHROID, LEVOTHROID) 75 MCG tablet, 1 tablet every morning, Disp: , Rfl:  .  lisinopril (PRINIVIL,ZESTRIL) 40 MG tablet, Take by mouth., Disp: , Rfl:  .  nitroGLYCERIN (NITROSTAT) 0.4 MG SL tablet, Place under the tongue., Disp: , Rfl:  .  omeprazole (PRILOSEC) 40 MG capsule, 1 capsule every morning, Disp: ,  Rfl:  .  prednisoLONE acetate (PRED FORTE) 1 % ophthalmic suspension, , Disp: , Rfl:  .  rOPINIRole (REQUIP) 0.25 MG tablet, TAKE 1 TABLET (0.25 MG TOTAL) BY MOUTH AT BEDTIME. MAY INCREASE WEEKLY AS NEEDED FOR RLS, Disp: , Rfl:  .  venlafaxine XR (EFFEXOR-XR) 150 MG 24 hr capsule, 1 capsule every morning, Disp: , Rfl:  .  aspirin EC 81 MG tablet, Take 81 mg by mouth daily., Disp: , Rfl:   EXAM:  VITALS per patient if applicable:  GENERAL: alert, oriented, appears well and in no acute distress  HEENT: atraumatic, conjunttiva clear, no obvious abnormalities on inspection of external nose and ears  NECK: normal movements of the head and neck  LUNGS: on inspection no signs of respiratory distress, breathing rate appears normal, no obvious gross SOB, gasping or wheezing  CV: no obvious cyanosis  MS: moves all visible extremities without noticeable abnormality  PSYCH/NEURO: pleasant and cooperative, no obvious depression or anxiety, speech and thought processing grossly intact  ASSESSMENT AND PLAN:   History of DVT (deep vein thrombosis) Treated with Xarelto x 3 months.  Finished in December 2019  Hyperlipidemia LDL goal <100 LDL and triglycerides have been at goal on statin.  She has no side effects and liver enzymes are normal. No changes today   Lab Results  Component Value Date   CHOL 145 09/13/2018   HDL 49.20 09/13/2018   LDLCALC 64 09/13/2018   LDLDIRECT 71.0 08/31/2016   TRIG 159.0 (H) 09/13/2018   CHOLHDL 3 09/13/2018   Lab Results  Component Value Date   ALT 37 (H) 09/13/2018   AST 34 09/13/2018   ALKPHOS 45 09/13/2018   BILITOT 0.4 09/13/2018     OSA on CPAP She needs a repeat study given new complaint of hypersomnia   COPD (chronic obstructive pulmonary disease) with emphysema She has a persistent cough because she continues to smoke.  Last chest x ray reviewed. . No infiltrates or masses were seen.   Essential hypertension She has been asked to  monitor her bp at home since her follow up visit was virtual  . Goal is < 130/60 given age . No changes today    Diabetes mellitus without complication ([place]) She has no been managing her daibetes if A1cs > a1c will need medications and accuchecks  So will need an rx for Glucometer rx sent  Lab Results  Component Value Date   HGBA1C 7.0 (H) 09/13/2018       I discussed the assessment and treatment plan with the patient. The patient was provided an opportunity to ask questions and all were answered. The patient agreed with the plan and demonstrated an understanding of the instructions.   The patient was advised to call back or seek an in-person evaluation if the symptoms worsen or if the condition fails to improve as anticipated.  I provided  25 minutes of non-face-to-face time during this encounter.   [name], MD

## 2018-12-25 ENCOUNTER — Encounter: Payer: Self-pay | Admitting: Internal Medicine

## 2018-12-25 ENCOUNTER — Other Ambulatory Visit: Payer: Self-pay

## 2018-12-25 ENCOUNTER — Other Ambulatory Visit (INDEPENDENT_AMBULATORY_CARE_PROVIDER_SITE_OTHER): Payer: Medicare Other

## 2018-12-25 DIAGNOSIS — E039 Hypothyroidism, unspecified: Secondary | ICD-10-CM

## 2018-12-25 DIAGNOSIS — E119 Type 2 diabetes mellitus without complications: Secondary | ICD-10-CM | POA: Insufficient documentation

## 2018-12-25 DIAGNOSIS — E785 Hyperlipidemia, unspecified: Secondary | ICD-10-CM | POA: Insufficient documentation

## 2018-12-25 DIAGNOSIS — E1169 Type 2 diabetes mellitus with other specified complication: Secondary | ICD-10-CM | POA: Insufficient documentation

## 2018-12-25 NOTE — Assessment & Plan Note (Addendum)
LDL and triglycerides have been at goal on statin.  She has no side effects and liver enzymes are normal. No changes today   Lab Results  Component Value Date   CHOL 145 09/13/2018   HDL 49.20 09/13/2018   LDLCALC 64 09/13/2018   LDLDIRECT 71.0 08/31/2016   TRIG 159.0 (H) 09/13/2018   CHOLHDL 3 09/13/2018   Lab Results  Component Value Date   ALT 37 (H) 09/13/2018   AST 34 09/13/2018   ALKPHOS 45 09/13/2018   BILITOT 0.4 09/13/2018

## 2018-12-25 NOTE — Assessment & Plan Note (Signed)
She has a persistent cough because she continues to smoke.  Last chest x ray reviewed. . No infiltrates or masses were seen.

## 2018-12-25 NOTE — Assessment & Plan Note (Signed)
She has been asked to monitor her bp at home since her follow up visit was virtual  . Goal is < 130/60 given age . No changes today

## 2018-12-25 NOTE — Assessment & Plan Note (Signed)
She needs a repeat study given new complaint of hypersomnia

## 2018-12-25 NOTE — Assessment & Plan Note (Signed)
She has no been managing her daibetes if A1cs > a1c will need medications and accuchecks  So will need an rx for Glucometer rx sent  Lab Results  Component Value Date   HGBA1C 7.0 (H) 09/13/2018

## 2018-12-25 NOTE — Assessment & Plan Note (Signed)
Treated with Xarelto x 3 months.  Finished in December 2019

## 2018-12-26 DIAGNOSIS — F17210 Nicotine dependence, cigarettes, uncomplicated: Secondary | ICD-10-CM | POA: Diagnosis not present

## 2018-12-26 DIAGNOSIS — E119 Type 2 diabetes mellitus without complications: Secondary | ICD-10-CM | POA: Diagnosis not present

## 2018-12-26 DIAGNOSIS — Z9989 Dependence on other enabling machines and devices: Secondary | ICD-10-CM | POA: Diagnosis not present

## 2018-12-26 DIAGNOSIS — Z86718 Personal history of other venous thrombosis and embolism: Secondary | ICD-10-CM | POA: Diagnosis not present

## 2018-12-26 DIAGNOSIS — I10 Essential (primary) hypertension: Secondary | ICD-10-CM | POA: Diagnosis not present

## 2018-12-26 DIAGNOSIS — Z79899 Other long term (current) drug therapy: Secondary | ICD-10-CM | POA: Diagnosis not present

## 2018-12-26 DIAGNOSIS — E039 Hypothyroidism, unspecified: Secondary | ICD-10-CM | POA: Diagnosis not present

## 2018-12-26 DIAGNOSIS — Z6832 Body mass index (BMI) 32.0-32.9, adult: Secondary | ICD-10-CM | POA: Diagnosis not present

## 2018-12-26 DIAGNOSIS — G2581 Restless legs syndrome: Secondary | ICD-10-CM | POA: Diagnosis not present

## 2018-12-26 DIAGNOSIS — E669 Obesity, unspecified: Secondary | ICD-10-CM | POA: Diagnosis not present

## 2018-12-26 DIAGNOSIS — G4733 Obstructive sleep apnea (adult) (pediatric): Secondary | ICD-10-CM | POA: Diagnosis not present

## 2018-12-26 DIAGNOSIS — J449 Chronic obstructive pulmonary disease, unspecified: Secondary | ICD-10-CM | POA: Diagnosis not present

## 2018-12-26 DIAGNOSIS — K219 Gastro-esophageal reflux disease without esophagitis: Secondary | ICD-10-CM | POA: Diagnosis not present

## 2018-12-26 DIAGNOSIS — H25811 Combined forms of age-related cataract, right eye: Secondary | ICD-10-CM | POA: Diagnosis not present

## 2018-12-26 DIAGNOSIS — E1136 Type 2 diabetes mellitus with diabetic cataract: Secondary | ICD-10-CM | POA: Diagnosis not present

## 2018-12-26 DIAGNOSIS — F411 Generalized anxiety disorder: Secondary | ICD-10-CM | POA: Diagnosis not present

## 2018-12-26 DIAGNOSIS — F329 Major depressive disorder, single episode, unspecified: Secondary | ICD-10-CM | POA: Diagnosis not present

## 2018-12-26 LAB — COMPREHENSIVE METABOLIC PANEL
ALT: 57 U/L — ABNORMAL HIGH (ref 0–35)
AST: 58 U/L — ABNORMAL HIGH (ref 0–37)
Albumin: 4.3 g/dL (ref 3.5–5.2)
Alkaline Phosphatase: 49 U/L (ref 39–117)
BUN: 19 mg/dL (ref 6–23)
CO2: 27 mEq/L (ref 19–32)
Calcium: 9.5 mg/dL (ref 8.4–10.5)
Chloride: 101 mEq/L (ref 96–112)
Creatinine, Ser: 1.15 mg/dL (ref 0.40–1.20)
GFR: 46.57 mL/min — ABNORMAL LOW (ref >60.00)
Glucose, Bld: 155 mg/dL — ABNORMAL HIGH (ref 70–99)
Potassium: 4.2 mEq/L (ref 3.5–5.1)
Sodium: 139 mEq/L (ref 135–145)
Total Bilirubin: 0.3 mg/dL (ref 0.2–1.2)
Total Protein: 6.6 g/dL (ref 6.0–8.3)

## 2018-12-26 LAB — MICROALBUMIN / CREATININE URINE RATIO
Creatinine,U: 194.9 mg/dL
Microalb Creat Ratio: 0.6 mg/g (ref 0.0–30.0)
Microalb, Ur: 1.1 mg/dL (ref 0.0–1.9)

## 2018-12-26 LAB — HEMOGLOBIN A1C: Hgb A1c MFr Bld: 7.4 % — ABNORMAL HIGH (ref 4.6–6.5)

## 2018-12-26 LAB — TSH: TSH: 5.56 u[IU]/mL — ABNORMAL HIGH (ref 0.35–4.50)

## 2019-01-02 ENCOUNTER — Other Ambulatory Visit: Payer: Self-pay | Admitting: Internal Medicine

## 2019-01-02 DIAGNOSIS — E119 Type 2 diabetes mellitus without complications: Secondary | ICD-10-CM

## 2019-01-02 DIAGNOSIS — I10 Essential (primary) hypertension: Secondary | ICD-10-CM

## 2019-01-02 DIAGNOSIS — E039 Hypothyroidism, unspecified: Secondary | ICD-10-CM

## 2019-01-02 MED ORDER — LEVOTHYROXINE SODIUM 88 MCG PO TABS: 88.0000 ug | ORAL_TABLET | Freq: Every day | ORAL | 0 refills | Status: DC

## 2019-01-02 MED ORDER — LOSARTAN POTASSIUM 100 MG PO TABS: 100.0000 mg | ORAL_TABLET | Freq: Every day | ORAL | 3 refills | Status: DC

## 2019-01-02 MED ORDER — METFORMIN HCL 500 MG PO TABS: 500.0000 mg | ORAL_TABLET | Freq: Two times a day (BID) | ORAL | 3 refills | Status: DC

## 2019-01-02 NOTE — Progress Notes (Signed)
Metformin,  Losartan and higher dose of levothyroxine sent to [facility]  repeat labs needed in 3 months , non fasting  And ordered

## 2019-01-04 ENCOUNTER — Other Ambulatory Visit: Payer: Self-pay | Admitting: Internal Medicine

## 2019-01-30 ENCOUNTER — Telehealth: Payer: Self-pay | Admitting: Internal Medicine

## 2019-01-30 ENCOUNTER — Other Ambulatory Visit: Payer: Self-pay | Admitting: Internal Medicine

## 2019-01-30 ENCOUNTER — Telehealth: Payer: Self-pay

## 2019-01-30 DIAGNOSIS — Z01818 Encounter for other preprocedural examination: Secondary | ICD-10-CM

## 2019-01-30 NOTE — Telephone Encounter (Signed)
covid-19 testing ordered for pre-admit

## 2019-01-30 NOTE — Telephone Encounter (Signed)
Pt states she was told her pharmacy only has losartan (COZAAR) 50 MG tablet available.  Pt would like to know is Dr Derrel Nip would send in new rx for  (2) 50MG  tabs per day.  Please advise.

## 2019-01-31 NOTE — Telephone Encounter (Signed)
I called and spoke with the patient and informed her to take 2 of the 50 mg tablets of Losarten until the RX runs out or the 100 mg losartan is back in the pharmacy because its on backorder, patient understood.  Patient also informed me that she was doing a sleep study and wanted to know if she could take Benadryl to fall asleep during the study because she can't sleep I explained to the patient not to take any medications because it would mess up the study, I informed her that the [point of the study was to watch her sleep and how long it took for her to go to sleep and that medication would alter the study, pt understood.  Nina,cma

## 2019-02-08 ENCOUNTER — Other Ambulatory Visit
Admission: RE | Admit: 2019-02-08 | Discharge: 2019-02-08 | Disposition: A | Discharge status: Home / Self Care | Payer: Medicare Other | Source: Ambulatory Visit | Attending: Internal Medicine | Admitting: Internal Medicine

## 2019-02-08 ENCOUNTER — Other Ambulatory Visit: Payer: Self-pay

## 2019-02-08 DIAGNOSIS — Z20828 Contact with and (suspected) exposure to other viral communicable diseases: Secondary | ICD-10-CM | POA: Insufficient documentation

## 2019-02-08 DIAGNOSIS — Z01812 Encounter for preprocedural laboratory examination: Secondary | ICD-10-CM | POA: Insufficient documentation

## 2019-02-08 LAB — SARS CORONAVIRUS 2 (TAT 6-24 HRS): SARS Coronavirus 2: NEGATIVE

## 2019-02-12 ENCOUNTER — Ambulatory Visit: Payer: Medicare Other | Attending: Neurology

## 2019-02-12 DIAGNOSIS — G4733 Obstructive sleep apnea (adult) (pediatric): Secondary | ICD-10-CM | POA: Diagnosis not present

## 2019-02-12 DIAGNOSIS — F5104 Psychophysiologic insomnia: Secondary | ICD-10-CM | POA: Diagnosis not present

## 2019-02-12 DIAGNOSIS — F5101 Primary insomnia: Secondary | ICD-10-CM | POA: Diagnosis not present

## 2019-02-13 ENCOUNTER — Other Ambulatory Visit: Payer: Self-pay

## 2019-02-25 ENCOUNTER — Encounter: Payer: Self-pay | Admitting: Podiatry

## 2019-02-25 ENCOUNTER — Other Ambulatory Visit: Payer: Self-pay

## 2019-02-25 ENCOUNTER — Ambulatory Visit (INDEPENDENT_AMBULATORY_CARE_PROVIDER_SITE_OTHER): Payer: Medicare Other | Admitting: Podiatry

## 2019-02-25 VITALS — Temp 98.0°F

## 2019-02-25 DIAGNOSIS — M79674 Pain in right toe(s): Secondary | ICD-10-CM | POA: Diagnosis not present

## 2019-02-25 DIAGNOSIS — D689 Coagulation defect, unspecified: Secondary | ICD-10-CM

## 2019-02-25 DIAGNOSIS — M79675 Pain in left toe(s): Secondary | ICD-10-CM

## 2019-02-25 DIAGNOSIS — B351 Tinea unguium: Secondary | ICD-10-CM | POA: Diagnosis not present

## 2019-02-25 DIAGNOSIS — E119 Type 2 diabetes mellitus without complications: Secondary | ICD-10-CM

## 2019-02-25 NOTE — Progress Notes (Signed)
Complaint:  Visit Type: Patient returns to my office for continued preventative foot care services. Complaint: Patient states" my nails have grown long and thick and become painful to walk and wear shoes" Patient has been diagnosed with DM with no foot complications. The patient presents for preventative foot care services. No changes to ROS  Podiatric Exam: Vascular: dorsalis pedis and posterior tibial pulses are palpable bilateral. Capillary return is immediate. Temperature gradient is WNL. Skin turgor WNL  Sensorium: Normal Semmes Weinstein monofilament test. Normal tactile sensation bilaterally. Nail Exam: Pt has thick disfigured discolored nails with subungual debris noted bilateral entire nail hallux .  Incurvated hallux nails. Ulcer Exam: There is no evidence of ulcer or pre-ulcerative changes or infection. Orthopedic Exam: Muscle tone and strength are WNL. No limitations in general ROM. No crepitus or effusions noted. Foot type and digits show no abnormalities. Functional hallux limitus 1st MPJ  B/L. Skin: No Porokeratosis. No infection or ulcers  Diagnosis:  Onychomycosis, , Pain in right toe, pain in left toes  Treatment & Plan Procedures and Treatment: Consent by patient was obtained for treatment procedures.   Debridement of mycotic and hypertrophic toenails, 1 through 5 bilateral and clearing of subungual debris. No ulceration, no infection noted.  Return Visit-Office Procedure: Patient instructed to return to the office for a follow up visit 3 months for continued evaluation and treatment.    Gardiner Barefoot DPM

## 2019-03-19 ENCOUNTER — Other Ambulatory Visit: Payer: Self-pay

## 2019-03-21 ENCOUNTER — Encounter: Payer: Self-pay | Admitting: Internal Medicine

## 2019-03-21 ENCOUNTER — Other Ambulatory Visit: Payer: Self-pay

## 2019-03-21 ENCOUNTER — Telehealth: Payer: Self-pay | Admitting: General Practice

## 2019-03-21 ENCOUNTER — Ambulatory Visit: Payer: Medicare Other

## 2019-03-21 ENCOUNTER — Ambulatory Visit (INDEPENDENT_AMBULATORY_CARE_PROVIDER_SITE_OTHER): Payer: Medicare Other

## 2019-03-21 ENCOUNTER — Ambulatory Visit (INDEPENDENT_AMBULATORY_CARE_PROVIDER_SITE_OTHER): Payer: Medicare Other | Admitting: Internal Medicine

## 2019-03-21 VITALS — Ht 66.0 in | Wt 198.0 lb

## 2019-03-21 DIAGNOSIS — Z9989 Dependence on other enabling machines and devices: Secondary | ICD-10-CM | POA: Diagnosis not present

## 2019-03-21 DIAGNOSIS — E119 Type 2 diabetes mellitus without complications: Secondary | ICD-10-CM | POA: Diagnosis not present

## 2019-03-21 DIAGNOSIS — I10 Essential (primary) hypertension: Secondary | ICD-10-CM

## 2019-03-21 DIAGNOSIS — I824Z2 Acute embolism and thrombosis of unspecified deep veins of left distal lower extremity: Secondary | ICD-10-CM | POA: Diagnosis not present

## 2019-03-21 DIAGNOSIS — G4733 Obstructive sleep apnea (adult) (pediatric): Secondary | ICD-10-CM

## 2019-03-21 DIAGNOSIS — I150 Renovascular hypertension: Secondary | ICD-10-CM | POA: Diagnosis not present

## 2019-03-21 DIAGNOSIS — E340 Carcinoid syndrome: Secondary | ICD-10-CM | POA: Diagnosis not present

## 2019-03-21 DIAGNOSIS — J431 Panlobular emphysema: Secondary | ICD-10-CM

## 2019-03-21 DIAGNOSIS — R232 Flushing: Secondary | ICD-10-CM

## 2019-03-21 DIAGNOSIS — E039 Hypothyroidism, unspecified: Secondary | ICD-10-CM | POA: Diagnosis not present

## 2019-03-21 DIAGNOSIS — Z720 Tobacco use: Secondary | ICD-10-CM

## 2019-03-21 DIAGNOSIS — K22719 Barrett's esophagus with dysplasia, unspecified: Secondary | ICD-10-CM | POA: Diagnosis not present

## 2019-03-21 DIAGNOSIS — Z0000 Encounter for general adult medical examination without abnormal findings: Secondary | ICD-10-CM | POA: Diagnosis not present

## 2019-03-21 MED ORDER — HYDROCHLOROTHIAZIDE 25 MG PO TABS: 25.0000 mg | ORAL_TABLET | Freq: Every day | ORAL | 3 refills | Status: DC

## 2019-03-21 MED ORDER — METFORMIN HCL ER 500 MG PO TB24: 500.0000 mg | ORAL_TABLET | Freq: Every day | ORAL | 1 refills | Status: DC

## 2019-03-21 NOTE — Progress Notes (Addendum)
Virtual Visit via [email or web address]  This visit type was conducted due to national recommendations for restrictions regarding the COVID-19 pandemic (e.g. social distancing).  This format is felt to be most appropriate for this patient at this time.  All issues noted in this document were discussed and addressed.  No physical exam was performed (except for noted visual exam findings with Video Visits).   I connected with@ on 03/21/19 at  9:30 AM EDT by a video enabled telemedicine application or telephone and verified that I am speaking with the correct person using two identifiers. Location patient: home Location provider: work or home office Persons participating in the virtual visit: patient, provider  I discussed the limitations, risks, security and privacy concerns of performing an evaluation and management service by telephone and the availability of in person appointments. I also discussed with the patient that there may be a patient responsible charge related to this service. The patient expressed understanding and agreed to proceed.   Reason for visit: follow up on multiple issues, including type 2 DM<  History of f DVT  , COPD,  Ongoing tobacco abuse  Etc.   HPI:  73 YR OLD with COPD, CAD type 2 DM,  OSA, and History of breast ca  Tobacco abuse ongoing   Presented for FTF visit but changed to virtual due to potential exposure to COVID 19   The patient has no signs or symptoms of COVID 19 infection (fever, cough, sore throat  or shortness of breath beyond what is typical for patient).  However, patient's daughter and grandchildren have been exposed to a patient with COVID 46  , and she has had contact with them earlier I n the week prior to their isolation which started yesterday.  Smoking 1 pack of cigs daily.  Has nicotine patches,  But has not picked  A started date.  Dicussed today,  She will quit on Monday  HTN:  Has not checked BP since last visit.  Today's reading at home 155/103.  Has  noticed redness and fullness of face with bending over,  And diaphoresis with most activities but not at rest.  Denies chest pain and dyspnea.  62 hrour urine collection was negative for 5H1AA   OSA  HAD sleep study in august ,  Results in chart but neither I or patient were notified.  She did NOT tolerate the full face mask that was recommended during the CPAP trial and is still using the nasal pillows she has been using for  "20 years."  DM:  Fasting and random sugars have been 125 or less.  No lows.  Metformin 500 mg daily started in June;  Has loose watery stools nearly daily since starting medication.  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  Was treated with Eliquis    Until Oct 1,  Repeat U/s was negative  for DVT .   ROS: See pertinent positives and negatives per HPI.  Past Medical History:  Diagnosis Date  . Barrett's esophagus   . Breast cancer ([place]) 7/10   left , invasive lobular carcinoma  . Complication of anesthesia    difficulty waking up and sleep apnea, low sats  . COPD (chronic obstructive pulmonary disease) ([place])   . Coronary artery disease   . Depression   . GERD (gastroesophageal reflux disease)   . Hypercholesteremia   . Hypertension   . Hypothyroidism   .  Personal history of tobacco use, presenting hazards to health 05/06/2015  . Personal history of tobacco use, presenting hazards to health 05/06/2015  . Pneumonia    hx of  . PVC (premature ventricular contraction)   . Sleep apnea    wears CPAP nightly  . Trigeminal neuralgia     Past Surgical History:  Procedure Laterality Date  . BREAST SURGERY Bilateral 2011   dbl mastectomy  . COLONOSCOPY W/ POLYPECTOMY    . CRANIECTOMY Right 01/21/2016   Procedure: Microvascular Decompression - right trigemminal nerve;  Surgeon: [name], MD;  Location: [facility];  Service: Neurosurgery;  Laterality: Right;  .  CRANIOTOMY Right 02/03/2016   Procedure: REPAIR OF CEREBROSPINAL FLUID LEAK AND HARVEST ABDOMINAL FAT GRAFT;  Surgeon: [name], MD;  Location: [facility];  Service: Neurosurgery;  Laterality: Right;  . LUMBAR LAMINECTOMY/DECOMPRESSION MICRODISCECTOMY Left 08/11/2015   Procedure: Left Lumbar four-five microdiscectomy;  Surgeon: [name], MD;  Location: [facility];  Service: Neurosurgery;  Laterality: Left;  [name] MASTECTOMY  05/2009   bilateral, reconstructive surgery 09/2009  . MASTECTOMY     Bilateral     Family History  Problem Relation Age of Onset  . Mental illness Mother   . Diabetes Mother   . Hypertension Mother   . Hyperlipidemia Mother   . Alzheimer's disease Mother   . Hypertension Father   . Hyperlipidemia Father   . Stroke Father   . Heart attack Father   . Diabetes Father   . Deep vein thrombosis Father   . Colon cancer Father   . Cancer Brother        Lung  . Diabetes Brother   . Diabetes Brother   . Stroke Maternal Grandmother     SOCIAL HX:  reports that she has been smoking cigarettes. She has a 54.00 pack-year smoking history. She has never used smokeless tobacco. She reports that she does not drink alcohol or use drugs.   Current Outpatient Medications:  .  aspirin EC 81 MG tablet, Take 81 mg by mouth daily., Disp: , Rfl:  .  atorvastatin (LIPITOR) 40 MG tablet, TAKE 1 TABLET BY MOUTH EVERY DAY, Disp: 90 tablet, Rfl: 0 .  blood glucose meter kit and supplies, Dispense based on patient and insurance preference. Use to check sugar once daily as directed. (FOR ICD-10 E10.9, E11.9)., Disp: 1 each, Rfl: 0 .  levothyroxine (SYNTHROID) 88 MCG tablet, TAKE 1 TABLET (88 MCG TOTAL) BY MOUTH DAILY BEFORE BREAKFAST., Disp: 90 tablet, Rfl: 0 .  losartan (COZAAR) 100 MG tablet, Take by mouth., Disp: , Rfl:  .  nitroGLYCERIN (NITROSTAT) 0.4 MG SL tablet, Place under the tongue., Disp: , Rfl:  .  omeprazole (PRILOSEC) 40 MG capsule, TAKE 1 CAPSULE BY MOUTH  EVERY DAY, Disp: 90 capsule, Rfl: 1 .  rOPINIRole (REQUIP) 0.25 MG tablet, TAKE 1 TABLET (0.25 MG TOTAL) BY MOUTH AT BEDTIME. MAY INCREASE WEEKLY AS NEEDED FOR RLS, Disp: , Rfl:  .  venlafaxine XR (EFFEXOR-XR) 150 MG 24 hr capsule, TAKE 1 CAPSULE BY MOUTH EVERY DAY, Disp: 90 capsule, Rfl: 1 .  hydrochlorothiazide (HYDRODIURIL) 25 MG tablet, Take 1 tablet (25 mg total) by mouth daily., Disp: 90 tablet, Rfl: 3 .  metFORMIN (GLUCOPHAGE XR) 500 MG 24 hr tablet, Take 1 tablet (500 mg total) by mouth daily with breakfast., Disp: 90 tablet, Rfl: 1  EXAM:  VITALS per patient if applicable:  GENERAL: alert, oriented, appears well and in no acute distress  HEENT: atraumatic, conjunttiva clear, no obvious abnormalities on inspection of external nose and ears  NECK: normal movements of the head and neck  LUNGS: on inspection no signs of respiratory distress, breathing rate appears normal, no obvious gross SOB, gasping or wheezing  CV: no obvious cyanosis  MS: moves all visible extremities without noticeable abnormality  PSYCH/NEURO: pleasant and cooperative, no obvious depression or anxiety, speech and thought processing grossly intact  ASSESSMENT AND PLAN:   Barrett's esophagus She is reminded to continue a daily dose of omeprazole.   COPD (chronic obstructive pulmonary disease) with emphysema She has a persistent cough because she continues to smoke.  Last chest x ray reviewed. . No infiltrates or masses were seen.   Diabetes mellitus without complication ([place]) She has not been managing her diabetes and her a1c has risen to 7.4.  She is not tolerating metformin 500 mg daily  Changing to xr version . Low glycemic index  And regular exerciser recommending an rx for Glucometer rx sent  Lab Results  Component Value Date   HGBA1C 7.4 (H) 12/25/2018     Current tobacco use Again Urged to quit  Smoking using a gradual reduction .  [name]  Prior intolerance to chantix and wellbutrin.    Essential hypertension Adding hctz today for elevated readings  Lab Results  Component Value Date   CREATININE 1.15 12/25/2018   Lab Results  Component Value Date   NA 139 12/25/2018   K 4.2 12/25/2018   CL 101 12/25/2018   CO2 27 12/25/2018   Lab Results  Component Value Date   MICROALBUR 1.1 12/25/2018     OSA on CPAP A repeat study this summer confirmed the need for full face mask,  but she cannot tolerate a full mask.    Pulmonary consult ordered     I discussed the assessment and treatment plan with the patient. The patient was provided an opportunity to ask questions and all were answered. The patient agreed with the plan and demonstrated an understanding of the instructions.   The patient was advised to call back or seek an in-person evaluation if the symptoms worsen or if the condition fails to improve as anticipated.  I provided 45 minutes of non-face-to-face time during this encounter reviewing all of patient's active problems with patient ,  summarizing their care in a problem based note (not done by specialists), and coordination of care  .   [name], MD

## 2019-03-21 NOTE — Telephone Encounter (Signed)
pt states that her grandaughter and grandson were in contact with their grandparents who were in contact with a nurse that was positive for COVID - she needs to make an appt with a referral - can we move forward or wait? Nobody else has tested at all or showing signs of symptoms. Can we move forward or should we wait? Let me knwo and I can schedule .

## 2019-03-21 NOTE — Progress Notes (Addendum)
Subjective:   Crystal Meyers is a 73 y.o. female who presents for Medicare Annual (Subsequent) preventive examination.  Review of Systems:  No ROS.  Medicare Wellness Virtual Visit.  Visual/audio telehealth visit, UTA vital signs.   See social history for additional risk factors.   Cardiac Risk Factors include: advanced age (>22mn, >>44women);hypertension;diabetes mellitus     Objective:     Vitals: There were no vitals taken for this visit.  There is no height or weight on file to calculate BMI.  Advanced Directives 03/21/2019 04/13/2018 03/15/2018 04/14/2017 12/29/2016 12/06/2016 01/21/2016  Does Patient Have a Medical Advance Directive? _0  Yes Yes  Type of Advance Directive Living will;Healthcare Power of Attorney Living will;Healthcare Power of [place]care [place] of [place] Does patient want to make changes to medical advance directive? No - Patient declined No - Patient declined No - Patient declined No - Patient declined - No - Patient declined No - Patient declined  Copy of [place] Chart? Yes - validated most recent copy scanned in chart (See row information) Yes Yes No - copy requested - Yes Yes  Would patient like information on creating a medical advance directive? - - - - - - No - patient declined information    Tobacco Social History   Tobacco Use  Smoking Status Current Every Day Smoker  . Packs/day: 1.00  . Years: 54.00  . Pack years: 54.00  . Types: Cigarettes  Smokeless Tobacco Never Used  Tobacco Comment   Refused cessation material     Ready to quit: Not Answered Counseling given: Not Answered Comment: Refused cessation material   Clinical Intake:  Pre-visit preparation completed: Yes        Diabetes: Yes(Followed by pcp)  How often do you  need to have someone help you when you read instructions, pamphlets, or other written materials from your doctor or pharmacy?: 1 - Never  Interpreter Needed?: No     Past Medical History:  Diagnosis Date  . Barrett's esophagus   . Breast cancer ([place] 7[place]10   left , invasive lobular carcinoma  . Complication of anesthesia    difficulty waking up and sleep apnea, low sats  . COPD (chronic obstructive pulmonary disease) [place]   . Coronary artery disease   . Depression   . GERD (gastroesophageal reflux disease)   . Hypercholesteremia   . Hypertension   . Hypothyroidism   . Personal history of tobacco use, presenting hazards to health 05/06/2015  . Personal history of tobacco use, presenting hazards to health 05/06/2015  . Pneumonia    hx of  . PVC (premature ventricular contraction)   . Sleep apnea    wears CPAP nightly  . Trigeminal neuralgia    Past Surgical History:  Procedure Laterality Date  . BREAST SURGERY Bilateral 2011   dbl mastectomy  . COLONOSCOPY W/ POLYPECTOMY    . CRANIECTOMY Right 01/21/2016   Procedure: Microvascular Decompression - right trigemminal nerve;  Surgeon: NConsuella Lose MD;  [place] [facility];  Service: Neurosurgery;  Laterality: Right;  . CRANIOTOMY Right 02/03/2016   Procedure: REPAIR OF CEREBROSPINAL FLUID LEAK AND HARVEST ABDOMINAL FAT GRAFT;  Surgeon: NConsuella Lose MD;  [facility];  Service: Neurosurgery;  Laterality: Right;  . LUMBAR LAMINECTOMY/DECOMPRESSION MICRODISCECTOMY Left 08/11/2015   Procedure: Left Lumbar four-five microdiscectomy;  Surgeon: [name], MD;  Location: [place] [facility];  Service: Neurosurgery;  Laterality: Left;  [name] MASTECTOMY  05/2009   bilateral, reconstructive surgery 09/2009  . MASTECTOMY     Bilateral    Family History  Problem Relation Age of Onset  . Mental illness Mother   . Diabetes Mother   . Hypertension Mother   . Hyperlipidemia Mother   . Alzheimer's disease Mother   .  Hypertension Father   . Hyperlipidemia Father   . Stroke Father   . Heart attack Father   . Diabetes Father   . Deep vein thrombosis Father   . Colon cancer Father   . Cancer Brother        Lung  . Diabetes Brother   . Diabetes Brother   . Stroke Maternal Grandmother    Social History   Socioeconomic History  . Marital status: Widowed    Spouse name: Not on file  . Number of children: Not on file  . Years of education: Not on file  . Highest education level: Not on file  Occupational History  . Not on file  Social Needs  . Financial resource strain: Not hard at all  . Food insecurity    Worry: Never true    Inability: Never true  . Transportation needs    Medical: No    Non-medical: No  Tobacco Use  . Smoking status: Current Every Day Smoker    Packs/day: 1.00    Years: 54.00    Pack years: 54.00    Types: Cigarettes  . Smokeless tobacco: Never Used  . Tobacco comment: Refused cessation material  Substance and Sexual Activity  . Alcohol use: No  . Drug use: No  . Sexual activity: Never  Lifestyle  . Physical activity    Days per week: 0 days    Minutes per session: Not on file  . Stress: Not on file  Relationships  . Social Herbalist on phone: Not on file    Gets together: Not on file    Attends religious service: Not on file    Active member of club or organization: Not on file    Attends meetings of clubs or organizations: Not on file    Relationship status: Not on file  Other Topics Concern  . Not on file  Social History Narrative   Lives with mother.   Recently widowed.   Always uses seat belts   Has a bird and a dog.   No exercise.    Outpatient Encounter Medications as of 03/21/2019  Medication Sig  . aspirin EC 81 MG tablet Take 81 mg by mouth daily.  [name] atorvastatin (LIPITOR) 40 MG tablet TAKE 1 TABLET BY MOUTH EVERY DAY  . blood glucose meter kit and supplies Dispense based on patient and insurance preference. Use to check sugar  once daily as directed. (FOR ICD-10 E10.9, E11.9).  . hydrochlorothiazide (HYDRODIURIL) 25 MG tablet Take 1 tablet (25 mg total) by mouth daily.  [name] levothyroxine (SYNTHROID) 88 MCG tablet TAKE 1 TABLET (88 MCG TOTAL) BY MOUTH DAILY BEFORE BREAKFAST.  [name] losartan (COZAAR) 100 MG tablet Take by mouth.  . metFORMIN (GLUCOPHAGE XR) 500 MG 24 hr tablet Take 1 tablet (500 mg total) by mouth daily with breakfast.  . nitroGLYCERIN (NITROSTAT) 0.4 MG SL tablet Place under the tongue.  [name] omeprazole (PRILOSEC) 40 MG capsule TAKE 1 CAPSULE BY MOUTH EVERY DAY  . rOPINIRole (REQUIP) 0.25 MG tablet TAKE 1  TABLET (0.25 MG TOTAL) BY MOUTH AT BEDTIME. MAY INCREASE WEEKLY AS NEEDED FOR RLS  . venlafaxine XR (EFFEXOR-XR) 150 MG 24 hr capsule TAKE 1 CAPSULE BY MOUTH EVERY DAY   No facility-administered encounter medications on file as of 03/21/2019.     Activities of Daily Living In your present state of health, do you have any difficulty performing the following activities: 03/21/2019  Hearing? N  Vision? N  Difficulty concentrating or making decisions? N  Walking or climbing stairs? N  Dressing or bathing? N  Doing errands, shopping? N  Preparing Food and eating ? N  Using the Toilet? N  In the past six months, have you accidently leaked urine? N  Do you have problems with loss of bowel control? N  Managing your Medications? N  Managing your Finances? N  Housekeeping or managing your Housekeeping? N  Some recent data might be hidden    Patient Care Team: Tullo, Teresa L, MD as PCP - General (Internal Medicine) McQueen, Chapman, MD (Unknown Physician Specialty)    Assessment:   This is a routine wellness examination for Anabel.  I connected with patient 03/21/19 at 11:00 AM EDT by an audio enabled telemedicine application and verified that I am speaking with the correct person using two identifiers. Patient stated full name and DOB. Patient gave permission to continue with virtual visit. Patient's  location was at home and Nurse's location was at [place] office.   Health Maintenance Due: See completed HM at the end of note.   Eye: Visual acuity not assessed. Virtual visit. Wears corrective lenses. Followed by their ophthalmologist every 12 months.  Retinopathy- none reported.  Dental: Visits every 6 months.    Hearing: Demonstrates normal hearing during visit.  Safety:  Patient feels safe at home- yes Patient does have smoke detectors at home- yes Patient does wear sunscreen or protective clothing when in direct sunlight - yes Patient does wear seat belt when in a moving vehicle - yes Patient drives- yes Adequate lighting in walkways free from debris- yes Grab bars and handrails used as appropriate- yes Ambulates with no assistive device Cell phone on person when ambulating outside of the home- yes  Social: Alcohol intake - no      Smoking history- current; not ready to quit Illicit drug use? None  Depression: PHQ 2 &9 complete. See screening below. Denies irritability, anhedonia, sadness/tearfullness.  Stable.   Falls: See screening below.    Medication: Taking as directed and without issues.   Covid-19: Precautions and sickness symptoms discussed. Wears mask, social distancing, hand hygiene as appropriate.   Activities of Daily Living Patient denies needing assistance with: household chores, feeding themselves, getting from bed to chair, getting to the toilet, bathing/showering, dressing, managing money, or preparing meals.   Memory: Patient is alert. Patient denies difficulty focusing or concentrating. Correctly identified the president of the [place], season and recall. Patient likes to play word games on for brain stimulation.  BMI- discussed the importance of a healthy diet, water intake and the benefits of aerobic exercise.  Educational material provided.  Physical activity- no routine  Diet:  Regular; monitors  Water: good intake Ensure/Protein  supplement: yes  Advanced Directive: End of life planning; Advance aging; Advanced directives discussed.  Copy of current HCPOA/Living Will on file.    Other Providers Patient Care Team: Tullo, Teresa L, MD as PCP - General (Internal Medicine) McQueen, Chapman, MD (Unknown Physician Specialty)  Exercise Activities and Dietary recommendations Current Exercise Habits: The   patient does not participate in regular exercise at present  Goals      Patient Stated   . Increase physical activity (pt-stated)     Use exercise ball and start walking       Other   . Quit Smoking     Decrease the amount of cigarettes smoked by 1 per day       Fall Risk Fall Risk  03/21/2019 03/21/2019 03/15/2018 12/06/2016 09/05/2016  Falls in the past year? 0 0 Yes No No  Number falls in past yr: - - 1 - -  Injury with Fall? - - No - -  Follow up - Falls evaluation completed - - -   Timed Get Up and Go performed: no, virtual visit  Depression Screen PHQ 2/9 Scores 03/21/2019 03/15/2018 12/06/2016 09/05/2016  PHQ - 2 Score 0 0 0 2     Cognitive Function     6CIT Screen 03/21/2019 03/15/2018 12/06/2016  What Year? 0 points 0 points 0 points  What month? 0 points 0 points 0 points  What time? 0 points 0 points 0 points  Count back from 20 0 points 0 points 0 points  Months in reverse 0 points 0 points 0 points  Repeat phrase 0 points 0 points 0 points  Total Score 0 0 0    Immunization History  Administered Date(s) Administered  . Influenza, High Dose Seasonal PF 06/09/2017, 03/15/2018  . Influenza,inj,Quad PF,6+ Mos 09/05/2016  . Pneumococcal Conjugate-13 11/25/2013  . Pneumococcal Polysaccharide-23 11/26/2006, 07/03/2015  . Tdap 04/13/2018   Screening Tests Health Maintenance  Topic Date Due  . INFLUENZA VACCINE  10/09/2019 (Originally 02/09/2019)  . HEMOGLOBIN A1C  06/26/2019  . FOOT EXAM  09/13/2019  . OPHTHALMOLOGY EXAM  12/25/2019  . COLONOSCOPY  09/10/2024  . TETANUS/TDAP  04/13/2028  .  DEXA SCAN  Completed  . Hepatitis C Screening  Completed  . PNA vac Low Risk Adult  Completed       Plan:   Keep all routine maintenance appointments.   Follow up completed with pcp today @ 930.  Medicare Attestation I have personally reviewed: The patient's medical and social history Their use of alcohol, tobacco or illicit drugs Their current medications and supplements The patient's functional ability including ADLs,fall risks, home safety risks, cognitive, and hearing and visual impairment Diet and physical activities Evidence for depression   In addition, I have reviewed and discussed with patient certain preventive protocols, quality metrics, and best practice recommendations. A written personalized care plan for preventive services as well as general preventive health recommendations were provided to patient via mail.     OBrien-Blaney, Lalah Durango L, LPN  07/29/1476    I have reviewed the above information and agree with above.   Deborra Medina, MD

## 2019-03-21 NOTE — Patient Instructions (Addendum)
  Crystal Meyers , Thank you for taking time to come for your Medicare Wellness Visit. I appreciate your ongoing commitment to your health goals. Please review the following plan we discussed and let me know if I can assist you in the future.   These are the goals we discussed: Goals      Patient Stated   . Increase physical activity (pt-stated)     Use exercise ball and start walking       Other   . Quit Smoking     Decrease the amount of cigarettes smoked by 1 per day       This is a list of the screening recommended for you and due dates:  Health Maintenance  Topic Date Due  . Flu Shot  10/09/2019*  . Hemoglobin A1C  06/26/2019  . Complete foot exam   09/13/2019  . Eye exam for diabetics  12/25/2019  . Colon Cancer Screening  09/10/2024  . Tetanus Vaccine  04/13/2028  . DEXA scan (bone density measurement)  Completed  .  Hepatitis C: One time screening is recommended by Center for Disease Control  (CDC) for  adults born from [birth year] through 1963.   Completed  . Pneumonia vaccines  Completed  *Topic was postponed. The date shown is not the original due date.

## 2019-03-21 NOTE — Patient Instructions (Signed)
I AM ADDING HCTZ FOR BLOOD PRESSURE  CHANGE LOSARTAN TO NIGHTTIME DOSING  CHANGING METFORMIN TO XR VERSION FOR BETTER TOLERATION    PULMONOLOGY CONSULT FOR MANAGEMENT OF  SLEEP APNEA  LABS IN 14 DAYS  (30 IF YOUR FAMILY TESTS POSITIVE FOR COVID )

## 2019-03-21 NOTE — Telephone Encounter (Signed)
I would recommend waiting two weeks.

## 2019-03-23 NOTE — Assessment & Plan Note (Signed)
She is reminded to continue a daily dose of omeprazole.

## 2019-03-23 NOTE — Assessment & Plan Note (Signed)
A repeat study this summer confirmed the need for full face mask,  but she cannot tolerate a full mask.    Pulmonary consult ordered

## 2019-03-23 NOTE — Assessment & Plan Note (Signed)
She has a persistent cough because she continues to smoke.  Last chest x ray reviewed. . No infiltrates or masses were seen.  

## 2019-03-23 NOTE — Assessment & Plan Note (Signed)
Adding hctz today for elevated readings  Lab Results  Component Value Date   CREATININE 1.15 12/25/2018   Lab Results  Component Value Date   NA 139 12/25/2018   K 4.2 12/25/2018   CL 101 12/25/2018   CO2 27 12/25/2018   Lab Results  Component Value Date   MICROALBUR 1.1 12/25/2018

## 2019-03-23 NOTE — Assessment & Plan Note (Addendum)
She has not been managing her diabetes and her a1c has risen to 7.4.  She is not tolerating metformin 500 mg daily  Changing to xr version . Low glycemic index  And regular exerciser recommending an rx for Glucometer rx sent  Lab Results  Component Value Date   HGBA1C 7.4 (H) 12/25/2018

## 2019-03-23 NOTE — Assessment & Plan Note (Signed)
Again Urged to quit  Smoking using a gradual reduction .  [name]  Prior intolerance to chantix and wellbutrin.

## 2019-04-04 ENCOUNTER — Encounter: Payer: Self-pay | Admitting: Internal Medicine

## 2019-04-04 ENCOUNTER — Other Ambulatory Visit: Payer: Self-pay

## 2019-04-04 ENCOUNTER — Ambulatory Visit (INDEPENDENT_AMBULATORY_CARE_PROVIDER_SITE_OTHER): Payer: Medicare Other | Admitting: Internal Medicine

## 2019-04-04 ENCOUNTER — Institutional Professional Consult (permissible substitution): Payer: Medicare Other | Admitting: Internal Medicine

## 2019-04-04 VITALS — BP 128/70 | HR 86 | Temp 97.6°F | Ht 66.0 in | Wt 196.0 lb

## 2019-04-04 DIAGNOSIS — G4733 Obstructive sleep apnea (adult) (pediatric): Secondary | ICD-10-CM | POA: Diagnosis not present

## 2019-04-04 NOTE — Patient Instructions (Addendum)
   Discussed sleep hygiene, and benefits of a fixed sleep waked time.  The importance of getting eight or more hours of sleep discussed with patient.  Discussed limiting the use of the computer and television before bedtime.  Decrease naps during the day, so night time sleep will become enhanced.  Limit caffeine, and sleep deprivation.   Patient wants to try 2 tabs of Trazadone prior to bedtime  Continue CPAP as prescibed

## 2019-04-04 NOTE — Progress Notes (Signed)
Name: Crystal Meyers MRN: [id number] DOB: 11/18/1945     CONSULTATION DATE: 04/04/2019  REFERRING MD : Derrel Nip  CHIEF COMPLAINT: excessive daytime sleepiness  HISTORY OF PRESENT ILLNESS: 73 year old pleasant white female seen today for diagnosis of sleep apnea Diagnosed 20 years ago Patient currently on CPAP therapy 13 cm of water pressure  At this time her biggest issue is insomnia Patient uses computers and plays games on her phone until she falls asleep  She has used trazodone in the past and has not helped She has used melatonin in the past which has not helped  I have discussed sleep hygiene with her  I will need a download to assess her compliance however at this time it seems that her CPAP is working and treating her sleep apnea Her main issue is insomnia     Discussed sleep data and reviewed with patient.  Encouraged proper weight management.  Discussed driving precautions and its relationship with hypersomnolence.  Discussed operating dangerous equipment and its relationship with hypersomnolence.  Discussed sleep hygiene, and benefits of a fixed sleep waked time.  The importance of getting eight or more hours of sleep discussed with patient.  Discussed limiting the use of the computer and television before bedtime.  Decrease naps during the day, so night time sleep will become enhanced.  Limit caffeine, and sleep deprivation.  HTN, stroke, and heart failure are potential risk factors.       PAST MEDICAL HISTORY :   has a past medical history of Barrett's esophagus, Breast cancer ([place]) (0/17), Complication of anesthesia, COPD (chronic obstructive pulmonary disease) ([place]), Coronary artery disease, Depression, GERD (gastroesophageal reflux disease), Hypercholesteremia, Hypertension, Hypothyroidism, Personal history of tobacco use, presenting hazards to health (05/06/2015), Personal history of tobacco use, presenting hazards to health (05/06/2015), Pneumonia, PVC  (premature ventricular contraction), Sleep apnea, and Trigeminal neuralgia.  has a past surgical history that includes Mastectomy (05/2009); Mastectomy; Lumbar laminectomy/decompression microdiscectomy (Left, 08/11/2015); Breast surgery (Bilateral, 2011); Colonoscopy w/ polypectomy; Craniectomy (Right, 01/21/2016); and Craniotomy (Right, 02/03/2016). Prior to Admission medications   Medication Sig Start Date End Date Taking? Authorizing Provider  aspirin EC 81 MG tablet Take 81 mg by mouth daily.    [provider]  atorvastatin (LIPITOR) 40 MG tablet TAKE 1 TABLET BY MOUTH EVERY DAY 01/30/19   [name], MD  blood glucose meter kit and supplies Dispense based on patient and insurance preference. Use to check sugar once daily as directed. (FOR ICD-10 E10.9, E11.9). 09/13/18   [name], MD  hydrochlorothiazide (HYDRODIURIL) 25 MG tablet Take 1 tablet (25 mg total) by mouth daily. 03/21/19   [name], MD  levothyroxine (SYNTHROID) 88 MCG tablet TAKE 1 TABLET (88 MCG TOTAL) BY MOUTH DAILY BEFORE BREAKFAST. 01/30/19   [name], MD  losartan (COZAAR) 100 MG tablet Take by mouth.    [provider]  metFORMIN (GLUCOPHAGE XR) 500 MG 24 hr tablet Take 1 tablet (500 mg total) by mouth daily with breakfast. 03/21/19   [name], MD  nitroGLYCERIN (NITROSTAT) 0.4 MG SL tablet Place under the tongue. 08/04/17   [provider]  omeprazole (PRILOSEC) 40 MG capsule TAKE 1 CAPSULE BY MOUTH EVERY DAY 01/07/19   [name], MD  rOPINIRole (REQUIP) 0.25 MG tablet TAKE 1 TABLET (0.25 MG TOTAL) BY MOUTH AT BEDTIME. MAY INCREASE WEEKLY AS NEEDED FOR RLS 10/01/17   [provider]  venlafaxine XR (EFFEXOR-XR) 150 MG 24 hr capsule TAKE 1 CAPSULE  BY MOUTH EVERY DAY 01/30/19   [name], MD   Allergies  Allergen Reactions   Lamisil [Terbinafine] Rash   No Known Allergies Rash    FAMILY HISTORY:  family history includes Alzheimer's disease in her  mother; Cancer in her brother; Colon cancer in her father; Deep vein thrombosis in her father; Diabetes in her brother, brother, father, and mother; Heart attack in her father; Hyperlipidemia in her father and mother; Hypertension in her father and mother; Mental illness in her mother; Stroke in her father and maternal grandmother. SOCIAL HISTORY:  reports that she has been smoking cigarettes. She has a 54.00 pack-year smoking history. She has never used smokeless tobacco. She reports that she does not drink alcohol or use drugs.    Review of Systems:  Gen:  Denies  fever, sweats, chills weigh loss  HEENT: Denies blurred vision, double vision, ear pain, eye pain, hearing loss, nose bleeds, sore throat Cardiac:  No dizziness, chest pain or heaviness, chest tightness,edema, No JVD Resp:   No cough, -sputum production, -shortness of breath,-wheezing, -hemoptysis,  Gi: Denies swallowing difficulty, stomach pain, nausea or vomiting, diarrhea, constipation, bowel incontinence Gu:  Denies bladder incontinence, burning urine Ext:   Denies Joint pain, stiffness or swelling Skin: Denies  skin rash, easy bruising or bleeding or hives Endoc:  Denies polyuria, polydipsia , polyphagia or weight change Psych:   Denies depression, insomnia or hallucinations  Other:  All other systems negative    BP 128/70 (BP Location: Left Arm, Cuff Size: Normal)    Pulse 86    Temp 97.6 F (36.4 C) (Temporal)    Ht _0  (1.676 m)    Wt 196 lb (88.9 kg)    SpO2 95%    BMI 31.64 kg/m        Physical Examination:   GENERAL:NAD, no fevers, chills, no weakness no fatigue HEAD: Normocephalic, atraumatic.  EYES: PERLA, EOMI No scleral icterus.  MOUTH: Moist mucosal membrane.  EAR, NOSE, THROAT: Clear without exudates. No external lesions.  NECK: Supple.  PULMONARY: CTA B/L no wheezing, rhonchi, crackles CARDIOVASCULAR: S1 and S2. Regular rate and rhythm. No murmurs GASTROINTESTINAL: Soft, nontender, nondistended.  Positive bowel sounds.  MUSCULOSKELETAL: No swelling, clubbing, or edema.  NEUROLOGIC: No gross focal neurological deficits. 5/5 strength all extremities SKIN: No ulceration, lesions, rashes, or cyanosis.  PSYCHIATRIC: Insight, judgment intact. -depression -anxiety ALL OTHER ROS ARE NEGATIVE   MEDICATIONS: I have reviewed all medications and confirmed regimen as documented       ASSESSMENT AND PLAN SYNOPSIS  73 year old pleasant white female with a history of sleep apnea currently on CPAP therapy Patient uses and benefits from CPAP therapy  At this time her main issue is insomnia however due to the fact that she uses her phone and computer late at night this may be the main reason why she has insomnia I told her to avoid any type of photostimulation Patient states she wants to also try 2 tablets of her trazodone which is not listed on her medication list     COVID-19 EDUCATION: The signs and symptoms of COVID-19 were discussed with the patient and how to seek care for testing.  The importance of social distancing was discussed today. Hand Washing Techniques and avoid touching face was advised.  MEDICATION ADJUSTMENTS/LABS AND TESTS ORDERED: Avoid photo stimulation   CURRENT MEDICATIONS REVIEWED AT LENGTH WITH PATIENT TODAY   Patient satisfied with Plan of action and management. All questions answered  Follow up in  6 months   Corrin Parker, M.D.  [facility]  Medical Director [place] Director [facility] Cardio-Pulmonary Department

## 2019-04-10 ENCOUNTER — Ambulatory Visit (INDEPENDENT_AMBULATORY_CARE_PROVIDER_SITE_OTHER): Payer: Medicare Other

## 2019-04-10 ENCOUNTER — Other Ambulatory Visit (INDEPENDENT_AMBULATORY_CARE_PROVIDER_SITE_OTHER): Payer: Medicare Other

## 2019-04-10 ENCOUNTER — Other Ambulatory Visit: Payer: Self-pay

## 2019-04-10 DIAGNOSIS — E039 Hypothyroidism, unspecified: Secondary | ICD-10-CM

## 2019-04-10 DIAGNOSIS — Z23 Encounter for immunization: Secondary | ICD-10-CM

## 2019-04-10 DIAGNOSIS — E119 Type 2 diabetes mellitus without complications: Secondary | ICD-10-CM

## 2019-04-10 DIAGNOSIS — I150 Renovascular hypertension: Secondary | ICD-10-CM | POA: Diagnosis not present

## 2019-04-10 DIAGNOSIS — R232 Flushing: Secondary | ICD-10-CM | POA: Diagnosis not present

## 2019-04-10 LAB — LIPID PANEL
Cholesterol: 121 mg/dL (ref 0–200)
HDL: 36.6 mg/dL — ABNORMAL LOW (ref >39.00)
LDL Cholesterol: 54 mg/dL (ref 0–99)
NonHDL: 84.59
Total CHOL/HDL Ratio: 3
Triglycerides: 151 mg/dL — ABNORMAL HIGH (ref 0.0–149.0)
VLDL: 30.2 mg/dL (ref 0.0–40.0)

## 2019-04-10 LAB — COMPREHENSIVE METABOLIC PANEL
ALT: 53 U/L — ABNORMAL HIGH (ref 0–35)
AST: 47 U/L — ABNORMAL HIGH (ref 0–37)
Albumin: 4.2 g/dL (ref 3.5–5.2)
Alkaline Phosphatase: 43 U/L (ref 39–117)
BUN: 14 mg/dL (ref 6–23)
CO2: 29 mEq/L (ref 19–32)
Calcium: 9.7 mg/dL (ref 8.4–10.5)
Chloride: 99 mEq/L (ref 96–112)
Creatinine, Ser: 1.01 mg/dL (ref 0.40–1.20)
GFR: 54.06 mL/min — ABNORMAL LOW (ref >60.00)
Glucose, Bld: 105 mg/dL — ABNORMAL HIGH (ref 70–99)
Potassium: 4.1 mEq/L (ref 3.5–5.1)
Sodium: 139 mEq/L (ref 135–145)
Total Bilirubin: 0.4 mg/dL (ref 0.2–1.2)
Total Protein: 6.5 g/dL (ref 6.0–8.3)

## 2019-04-10 LAB — CBC WITH DIFFERENTIAL/PLATELET
Basophils Absolute: 0.1 10*3/uL (ref 0.0–0.1)
Basophils Relative: 0.9 % (ref 0.0–3.0)
Eosinophils Absolute: 0.1 10*3/uL (ref 0.0–0.7)
Eosinophils Relative: 2.2 % (ref 0.0–5.0)
HCT: 36.8 % (ref 36.0–46.0)
Hemoglobin: 12.6 g/dL (ref 12.0–15.0)
Lymphocytes Relative: 36.8 % (ref 12.0–46.0)
Lymphs Abs: 2.1 10*3/uL (ref 0.7–4.0)
MCHC: 34.2 g/dL (ref 30.0–36.0)
MCV: 85.3 fl (ref 78.0–100.0)
Monocytes Absolute: 0.4 10*3/uL (ref 0.1–1.0)
Monocytes Relative: 6.7 % (ref 3.0–12.0)
Neutro Abs: 3.1 10*3/uL (ref 1.4–7.7)
Neutrophils Relative %: 53.4 % (ref 43.0–77.0)
Platelets: 237 10*3/uL (ref 150.0–400.0)
RBC: 4.31 Mil/uL (ref 3.87–5.11)
RDW: 14.5 % (ref 11.5–15.5)
WBC: 5.8 10*3/uL (ref 4.0–10.5)

## 2019-04-10 LAB — MICROALBUMIN / CREATININE URINE RATIO
Creatinine,U: 196.3 mg/dL
Microalb Creat Ratio: 0.4 mg/g (ref 0.0–30.0)
Microalb, Ur: 0.8 mg/dL (ref 0.0–1.9)

## 2019-04-10 LAB — HEMOGLOBIN A1C: Hgb A1c MFr Bld: 6.9 % — ABNORMAL HIGH (ref 4.6–6.5)

## 2019-04-10 LAB — TSH: TSH: 4.4 u[IU]/mL (ref 0.35–4.50)

## 2019-04-12 NOTE — Progress Notes (Signed)
Patient is coming in Monday for follow up, no questions or concerns

## 2019-04-15 ENCOUNTER — Inpatient Hospital Stay (HOSPITAL_BASED_OUTPATIENT_CLINIC_OR_DEPARTMENT_OTHER): Payer: Medicare HMO | Admitting: Internal Medicine

## 2019-04-15 ENCOUNTER — Inpatient Hospital Stay: Payer: Medicare HMO | Attending: Internal Medicine

## 2019-04-15 ENCOUNTER — Other Ambulatory Visit: Payer: Self-pay

## 2019-04-15 DIAGNOSIS — Z9013 Acquired absence of bilateral breasts and nipples: Secondary | ICD-10-CM | POA: Insufficient documentation

## 2019-04-15 DIAGNOSIS — Z170 Estrogen receptor positive status [ER+]: Secondary | ICD-10-CM | POA: Diagnosis not present

## 2019-04-15 DIAGNOSIS — Z79899 Other long term (current) drug therapy: Secondary | ICD-10-CM | POA: Insufficient documentation

## 2019-04-15 DIAGNOSIS — F17210 Nicotine dependence, cigarettes, uncomplicated: Secondary | ICD-10-CM | POA: Insufficient documentation

## 2019-04-15 DIAGNOSIS — C50412 Malignant neoplasm of upper-outer quadrant of left female breast: Secondary | ICD-10-CM

## 2019-04-15 DIAGNOSIS — E340 Carcinoid syndrome: Secondary | ICD-10-CM

## 2019-04-15 LAB — BASIC METABOLIC PANEL
Anion gap: 15 (ref 5–15)
BUN: 18 mg/dL (ref 8–23)
CO2: 26 mmol/L (ref 22–32)
Calcium: 9.4 mg/dL (ref 8.9–10.3)
Chloride: 100 mmol/L (ref 98–111)
Creatinine, Ser: 1.12 mg/dL — ABNORMAL HIGH (ref 0.44–1.00)
GFR calc Af Amer: 58 mL/min — ABNORMAL LOW (ref >60)
GFR calc non Af Amer: 50 mL/min — ABNORMAL LOW (ref >60)
Glucose, Bld: 126 mg/dL — ABNORMAL HIGH (ref 70–99)
Potassium: 3.7 mmol/L (ref 3.5–5.1)
Sodium: 141 mmol/L (ref 135–145)

## 2019-04-15 LAB — CBC WITH DIFFERENTIAL/PLATELET
Abs Immature Granulocytes: 0.03 10*3/uL (ref 0.00–0.07)
Basophils Absolute: 0.1 10*3/uL (ref 0.0–0.1)
Basophils Relative: 1 %
Eosinophils Absolute: 0.2 10*3/uL (ref 0.0–0.5)
Eosinophils Relative: 2 %
HCT: 38.9 % (ref 36.0–46.0)
Hemoglobin: 13.2 g/dL (ref 12.0–15.0)
Immature Granulocytes: 0 %
Lymphocytes Relative: 39 %
Lymphs Abs: 3.1 10*3/uL (ref 0.7–4.0)
MCH: 28.6 pg (ref 26.0–34.0)
MCHC: 33.9 g/dL (ref 30.0–36.0)
MCV: 84.2 fL (ref 80.0–100.0)
Monocytes Absolute: 0.5 10*3/uL (ref 0.1–1.0)
Monocytes Relative: 7 %
Neutro Abs: 4 10*3/uL (ref 1.7–7.7)
Neutrophils Relative %: 51 %
Platelets: 274 10*3/uL (ref 150–400)
RBC: 4.62 MIL/uL (ref 3.87–5.11)
RDW: 13.7 % (ref 11.5–15.5)
WBC: 7.9 10*3/uL (ref 4.0–10.5)
nRBC: 0 % (ref 0.0–0.2)

## 2019-04-15 MED ORDER — ERGOCALCIFEROL 1.25 MG (50000 UT) PO CAPS: 50000.0000 [IU] | ORAL_CAPSULE | ORAL | 1 refills | Status: DC

## 2019-04-15 NOTE — Progress Notes (Signed)
[facility] OFFICE PROGRESS NOTE  Patient Care Team: [name], MD as PCP - General (Internal Medicine) [name], MD (Unknown Physician Specialty)  Cancer Staging No matching staging information was found for the patient.   Oncology History Overview Note  1. 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 Low Oncotype DX score. Diagnosed in June 2010. 2. Status post-bilateral mastectomy and reconstructive surgery. 3. Started Femara approx. 2011.[name] 4.Breast cancer index revealed a 5.2% increase of late recurrence but low likelihood of benefit with extended adjuvant therapy (March of 2017) Letrozole was discontinued from March of 2017 ---------------------------------------------  DIAGNOSIS: BREAST CANCER  STAGE:   I      ;GOALS: cure   CURRENT/MOST RECENT THERAPY: surveillaince    Carcinoma of upper-outer quadrant of left breast in female, estrogen receptor positive ([place])     INTERVAL HISTORY:  Crystal Meyers 73 y.o.  female pleasant patient above history of stage I breast cancer is here for follow-up.  Patient complains of diffuse body aches joint pains.  Denies any focal pain.  No nausea no vomiting pain no headaches.  No new shortness of the cough.  Denies any lumps or bumps.  Review of Systems  Constitutional: Negative for chills, diaphoresis, fever, malaise/fatigue and weight loss.  HENT: Negative for nosebleeds and sore throat.   Eyes: Negative for double vision.  Respiratory: Negative for cough, hemoptysis, sputum production, shortness of breath and wheezing.   Cardiovascular: Negative for chest pain, palpitations, orthopnea and leg swelling.  Gastrointestinal: Negative for abdominal pain, blood in stool, constipation, diarrhea, heartburn, melena, nausea and vomiting.  Genitourinary: Negative for dysuria, frequency and urgency.   Musculoskeletal: Positive for back pain and joint pain.  Skin: Negative.  Negative for itching and rash.  Neurological: Negative for dizziness, tingling, focal weakness, weakness and headaches.  Endo/Heme/Allergies: Does not bruise/bleed easily.  Psychiatric/Behavioral: Negative for depression. The patient is not nervous/anxious and does not have insomnia.      PAST MEDICAL HISTORY :  Past Medical History:  Diagnosis Date  . Barrett's esophagus   . Breast cancer ([place]) 7/10   left , invasive lobular carcinoma  . Complication of anesthesia    difficulty waking up and sleep apnea, low sats  . COPD (chronic obstructive pulmonary disease) ([place])   . Coronary artery disease   . Depression   . GERD (gastroesophageal reflux disease)   . Hypercholesteremia   . Hypertension   . Hypothyroidism   . Personal history of tobacco use, presenting hazards to health 05/06/2015  . Personal history of tobacco use, presenting hazards to health 05/06/2015  . Pneumonia    hx of  . PVC (premature ventricular contraction)   . Sleep apnea    wears CPAP nightly  . Trigeminal neuralgia     PAST SURGICAL HISTORY :   Past Surgical History:  Procedure Laterality Date  . BREAST SURGERY Bilateral 2011   dbl mastectomy  . COLONOSCOPY W/ POLYPECTOMY    . CRANIECTOMY Right 01/21/2016   Procedure: Microvascular Decompression - right trigemminal nerve;  Surgeon: [name], MD;  Location: [facility];  Service: Neurosurgery;  Laterality: Right;  . CRANIOTOMY Right 02/03/2016   Procedure: REPAIR OF CEREBROSPINAL FLUID LEAK AND HARVEST ABDOMINAL FAT GRAFT;  Surgeon: [name], MD;  Location: [facility];  Service: Neurosurgery;  Laterality: Right;  . LUMBAR LAMINECTOMY/DECOMPRESSION MICRODISCECTOMY Left 08/11/2015   Procedure: Left Lumbar four-five microdiscectomy;  Surgeon: [name], MD;  Location: [facility];  Service: Neurosurgery;  Laterality: Left;  [name] MASTECTOMY  05/2009    bilateral, reconstructive surgery 09/2009  . MASTECTOMY     Bilateral     FAMILY HISTORY :   Family History  Problem Relation Age of Onset  . Mental illness Mother   . Diabetes Mother   . Hypertension Mother   . Hyperlipidemia Mother   . Alzheimer's disease Mother   . Hypertension Father   . Hyperlipidemia Father   . Stroke Father   . Heart attack Father   . Diabetes Father   . Deep vein thrombosis Father   . Colon cancer Father   . Cancer Brother        Lung  . Diabetes Brother   . Diabetes Brother   . Stroke Maternal Grandmother     SOCIAL HISTORY:   Social History   Tobacco Use  . Smoking status: Current Every Day Smoker    Packs/day: 1.00    Years: 54.00    Pack years: 54.00    Types: Cigarettes  . Smokeless tobacco: Never Used  . Tobacco comment: Refused cessation material  Substance Use Topics  . Alcohol use: No  . Drug use: No    ALLERGIES:  is allergic to lamisil [terbinafine] and no known allergies.  MEDICATIONS:  Current Outpatient Medications  Medication Sig Dispense Refill  . aspirin EC 81 MG tablet Take 81 mg by mouth daily.    [name] atorvastatin (LIPITOR) 40 MG tablet TAKE 1 TABLET BY MOUTH EVERY DAY 90 tablet 0  . blood glucose meter kit and supplies Dispense based on patient and insurance preference. Use to check sugar once daily as directed. (FOR ICD-10 E10.9, E11.9). 1 each 0  . hydrochlorothiazide (HYDRODIURIL) 25 MG tablet Take 1 tablet (25 mg total) by mouth daily. 90 tablet 3  . levothyroxine (SYNTHROID) 88 MCG tablet TAKE 1 TABLET (88 MCG TOTAL) BY MOUTH DAILY BEFORE BREAKFAST. 90 tablet 0  . losartan (COZAAR) 100 MG tablet Take by mouth.    . metFORMIN (GLUCOPHAGE XR) 500 MG 24 hr tablet Take 1 tablet (500 mg total) by mouth daily with breakfast. 90 tablet 1  . nitroGLYCERIN (NITROSTAT) 0.4 MG SL tablet Place under the tongue.    [name] omeprazole (PRILOSEC) 40 MG capsule TAKE 1 CAPSULE BY MOUTH EVERY DAY 90 capsule 1  . rOPINIRole (REQUIP) 0.25  MG tablet TAKE 1 TABLET (0.25 MG TOTAL) BY MOUTH AT BEDTIME. MAY INCREASE WEEKLY AS NEEDED FOR RLS    . venlafaxine XR (EFFEXOR-XR) 150 MG 24 hr capsule TAKE 1 CAPSULE BY MOUTH EVERY DAY 90 capsule 1  . ergocalciferol (VITAMIN D2) 1.25 MG (50000 UT) capsule Take 1 capsule (50,000 Units total) by mouth once a week. 12 capsule 1   No current facility-administered medications for this visit.     PHYSICAL EXAMINATION: ECOG PERFORMANCE STATUS: 0 - Asymptomatic  BP 112/69 (BP Location: Right Arm, Patient Position: Sitting)   Pulse (!) 101   Temp 99.2 F (37.3 C) (Tympanic)   Wt 195 lb (88.5 kg)   BMI 31.47 kg/m   Filed Weights   04/15/19 1440  Weight: 195 lb (88.5 kg)   Physical Exam  Constitutional: She is oriented to person, place, and time and well-developed, well-nourished, and in no distress.  HENT:  Head: Normocephalic and atraumatic.  Mouth/Throat: Oropharynx is clear and moist. No oropharyngeal exudate.  Eyes: Pupils are equal, round, and reactive to light.  Neck:  Normal range of motion. Neck supple.  Cardiovascular: Normal rate and regular rhythm.  Pulmonary/Chest: No respiratory distress. She has no wheezes.  Abdominal: Soft. Bowel sounds are normal. She exhibits no distension and no mass. There is no abdominal tenderness. There is no rebound and no guarding.  Musculoskeletal: Normal range of motion.        General: No tenderness or edema.  Neurological: She is alert and oriented to person, place, and time.  Skin: Skin is warm.  Bilateral mastectomies noted.  Bilateral reconstructed breasts.  No lumps or bumps noted.  Psychiatric: Affect normal.      LABORATORY DATA:  I have reviewed the data as listed    Component Value Date/Time   NA 141 04/15/2019 1407   NA 136 (A) 09/23/2014   NA 140 03/26/2014 1448   K 3.7 04/15/2019 1407   K 4.0 03/26/2014 1448   CL 100 04/15/2019 1407   CL 104 03/26/2014 1448   CO2 26 04/15/2019 1407   CO2 30 03/26/2014 1448   GLUCOSE  126 (H) 04/15/2019 1407   GLUCOSE 91 03/26/2014 1448   BUN 18 04/15/2019 1407   BUN 14 09/23/2014   BUN 19 (H) 03/26/2014 1448   CREATININE 1.12 (H) 04/15/2019 1407   CREATININE 1.12 03/26/2014 1448   CALCIUM 9.4 04/15/2019 1407   CALCIUM 9.5 03/26/2014 1448   PROT 6.5 04/10/2019 1112   PROT 6.9 03/26/2014 1448   ALBUMIN 4.2 04/10/2019 1112   ALBUMIN 3.7 03/26/2014 1448   AST 47 (H) 04/10/2019 1112   AST 23 03/26/2014 1448   ALT 53 (H) 04/10/2019 1112   ALT 35 03/26/2014 1448   ALKPHOS 43 04/10/2019 1112   ALKPHOS 45 (L) 03/26/2014 1448   BILITOT 0.4 04/10/2019 1112   BILITOT 0.3 03/26/2014 1448   GFRNONAA 50 (L) 04/15/2019 1407   GFRNONAA 52 (L) 03/26/2014 1448   GFRAA 58 (L) 04/15/2019 1407   GFRAA 60 (L) 03/26/2014 1448    No results found for: SPEP, UPEP  Lab Results  Component Value Date   WBC 7.9 04/15/2019   NEUTROABS 4.0 04/15/2019   HGB 13.2 04/15/2019   HCT 38.9 04/15/2019   MCV 84.2 04/15/2019   PLT 274 04/15/2019      Chemistry      Component Value Date/Time   NA 141 04/15/2019 1407   NA 136 (A) 09/23/2014   NA 140 03/26/2014 1448   K 3.7 04/15/2019 1407   K 4.0 03/26/2014 1448   CL 100 04/15/2019 1407   CL 104 03/26/2014 1448   CO2 26 04/15/2019 1407   CO2 30 03/26/2014 1448   BUN 18 04/15/2019 1407   BUN 14 09/23/2014   BUN 19 (H) 03/26/2014 1448   CREATININE 1.12 (H) 04/15/2019 1407   CREATININE 1.12 03/26/2014 1448   GLU 113 09/23/2014      Component Value Date/Time   CALCIUM 9.4 04/15/2019 1407   CALCIUM 9.5 03/26/2014 1448   ALKPHOS 43 04/10/2019 1112   ALKPHOS 45 (L) 03/26/2014 1448   AST 47 (H) 04/10/2019 1112   AST 23 03/26/2014 1448   ALT 53 (H) 04/10/2019 1112   ALT 35 03/26/2014 1448   BILITOT 0.4 04/10/2019 1112   BILITOT 0.3 03/26/2014 1448       RADIOGRAPHIC STUDIES: I have personally reviewed the radiological images as listed and agreed with the findings in the report. No results found.   ASSESSMENT & PLAN:   Carcinoma of upper-outer quadrant of left breast in  female, estrogen receptor positive ([place]) # Breast cancer stage I ER/PR positive left side status post mastectomy bilateral. Finished letrozole March 2017.  [7 years]. Low risk-oncotype.  # Currently on surveillance.  No evidence of recurrence.  Stable.  #Body aches/joint pains-worsened unlikely recurrent/metastatic disease.  Recommend a trial of Vit D 50,000/week.  If not improved patient to call us would recommend a bone scan.  # Acute LE DVT/swelling- [US below Knee] status post Eliquis improved.  # smoking/LCSP-#2019 normal.  Discussed with the patient regarding the ill effects of smoking- including but not limited to cardiac lung and vascular diseases and malignancies. Counseled against smoking; patient.   # DISPOSITION: # Follow-up in 12 months- MD; labs- cbc/cmp-Dr. B    Orders Placed This Encounter  Procedures  . CBC with Differential    Standing Status:   Future    Standing Expiration Date:   10/13/2020  . Comprehensive metabolic panel    Standing Status:   Future    Standing Expiration Date:   10/13/2020   All questions were answered. The patient knows to call the clinic with any problems, questions or concerns.      Cammie Sickle, MD 04/15/2019 4:07 PM

## 2019-04-15 NOTE — Assessment & Plan Note (Addendum)
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 Finished letrozole March 2017.  [7 years]. Low risk-oncotype.  # Currently on surveillance.  No evidence of recurrence.  Stable.  #Body aches/joint pains-worsened unlikely recurrent/metastatic disease.  Recommend a trial of Vit D 50,000/week.  If not improved patient to call us would recommend a bone scan.  # Acute LE DVT/swelling- [US below Knee] status post Eliquis improved.  # smoking/LCSP-#2019 normal.  Discussed with the patient regarding the ill effects of smoking- including but not limited to cardiac lung and vascular diseases and malignancies. Counseled against smoking; patient.   # DISPOSITION: # Follow-up in 12 months- MD; labs- cbc/cmp-Dr. B

## 2019-04-19 ENCOUNTER — Telehealth: Payer: Self-pay | Admitting: Internal Medicine

## 2019-04-19 MED ORDER — ASPIRIN EC 81 MG PO TBEC: 81.0000 mg | DELAYED_RELEASE_TABLET | Freq: Every day | ORAL | 1 refills | Status: AC

## 2019-04-19 MED ORDER — LEVOTHYROXINE SODIUM 88 MCG PO TABS: 88.0000 ug | ORAL_TABLET | Freq: Every day | ORAL | 3 refills | Status: DC

## 2019-04-19 NOTE — Telephone Encounter (Signed)
Refill for aspirin sent to [facility].

## 2019-04-19 NOTE — Telephone Encounter (Signed)
YES PLEASE SSEND.

## 2019-04-19 NOTE — Telephone Encounter (Signed)
Patient requesting call back from [facility] in regards

## 2019-04-19 NOTE — Telephone Encounter (Signed)
[place], [address] [facility] [address] requesting a 90 day supply,  hydrochlorothiazide (HYDRODIURIL) 25 MG tablet , levothyroxine (SYNTHROID) 88 MCG tablet , aspirin EC 81 MG tablet

## 2019-04-19 NOTE — Telephone Encounter (Signed)
Refill sent to [place] for levothyroxine today.  A refill for HTZ was already sent on 03/21/19.  A historical provider initially prescribed the aspirin.  Do you want to refill?  Pt request to be sent to [facility].

## 2019-04-22 ENCOUNTER — Other Ambulatory Visit: Payer: Self-pay

## 2019-04-22 MED ORDER — HYDROCHLOROTHIAZIDE 25 MG PO TABS: 25.0000 mg | ORAL_TABLET | Freq: Every day | ORAL | 3 refills | Status: DC

## 2019-04-22 NOTE — Telephone Encounter (Signed)
Returned call and spoke to patient.  Patient requested refill for hydrochlorothiazide be sent to [facility].  Cancelled Rx at [facility] and sent refill to [facility].  Patient aware.

## 2019-04-22 NOTE — Telephone Encounter (Signed)
Patient requested refill for hydrochlorothiazide be sent to [facility].  Cancelled Rx at [facility] and sent refill to [facility].  Patient aware.

## 2019-04-24 ENCOUNTER — Other Ambulatory Visit: Payer: Self-pay

## 2019-04-24 MED ORDER — ATORVASTATIN CALCIUM 40 MG PO TABS: 40.0000 mg | ORAL_TABLET | Freq: Every day | ORAL | 1 refills | Status: DC

## 2019-04-24 MED ORDER — ACCU-CHEK SOFTCLIX LANCETS MISC: 3 refills | Status: DC

## 2019-04-24 MED ORDER — OMEPRAZOLE 40 MG PO CPDR: DELAYED_RELEASE_CAPSULE | ORAL | 1 refills | Status: DC

## 2019-04-24 MED ORDER — METFORMIN HCL ER 500 MG PO TB24: 500.0000 mg | ORAL_TABLET | Freq: Every day | ORAL | 1 refills | Status: DC

## 2019-04-24 MED ORDER — VENLAFAXINE HCL ER 150 MG PO CP24: ORAL_CAPSULE | ORAL | 1 refills | Status: DC

## 2019-04-24 MED ORDER — ACCU-CHEK AVIVA PLUS VI STRP: ORAL_STRIP | 3 refills | Status: DC

## 2019-04-30 ENCOUNTER — Other Ambulatory Visit: Payer: Self-pay | Admitting: Internal Medicine

## 2019-04-30 NOTE — Telephone Encounter (Signed)
Patient is also requesting a refill for  one use alcohol swabs.

## 2019-04-30 NOTE — Telephone Encounter (Signed)
[facility] calling for refill of the one use alcohol swabs and the Aviva solution.  They say they have sent request several times.  [place], [place] [phone] (Phone) [phone] (Fax)

## 2019-04-30 NOTE — Telephone Encounter (Signed)
Requested medication (s) are due for refill today: yes  Requested medication (s) are on the active medication list: yes   Future visit scheduled: no  Notes to clinic:  Requesting refill on one use alcohol swabs   Requested Prescriptions  Pending Prescriptions Disp Refills   glucose blood (ACCU-CHEK AVIVA PLUS) test strip 100 each 3    Sig: Use to check sugar once daily. Dx. E11.9     There is no refill protocol information for this order

## 2019-05-03 MED ORDER — ACCU-CHEK AVIVA PLUS VI STRP: ORAL_STRIP | 3 refills | Status: DC

## 2019-05-07 ENCOUNTER — Other Ambulatory Visit: Payer: Self-pay | Admitting: Internal Medicine

## 2019-05-07 MED ORDER — BLOOD GLUCOSE METER KIT: PACK | 0 refills | Status: DC

## 2019-05-07 NOTE — Telephone Encounter (Signed)
Requested medication (s) are due for refill today: yes  Requested medication (s) are on the active medication list: yes  Last refill:  09/13/2018  Future visit scheduled: no  Notes to clinic:  Please refax script as pharmacy never received    Requested Prescriptions  Pending Prescriptions Disp Refills   blood glucose meter kit and supplies 1 each 0    Sig: Dispense based on patient and insurance preference. Use to check sugar once daily as directed. (FOR ICD-10 E10.9, E11.9).     There is no refill protocol information for this order        

## 2019-05-07 NOTE — Telephone Encounter (Signed)
Pt called stating the pharmacy has been trying to get a meter for pt to use. Pt states the pharmacy has received no response. Please advise.   [place], [place]  [place] [address]  Phone: [phone] Fax: [phone]  Not a 24 hour pharmacy; exact hours not known.

## 2019-05-14 ENCOUNTER — Telehealth: Payer: Self-pay

## 2019-05-14 MED ORDER — ACCU-CHEK AVIVA PLUS VI STRP: ORAL_STRIP | 3 refills | Status: DC

## 2019-05-14 MED ORDER — ACCU-CHEK SOFTCLIX LANCETS MISC: 3 refills | Status: DC

## 2019-05-14 NOTE — Telephone Encounter (Signed)
error 

## 2019-06-03 ENCOUNTER — Ambulatory Visit (INDEPENDENT_AMBULATORY_CARE_PROVIDER_SITE_OTHER): Payer: Medicare Other | Admitting: Podiatry

## 2019-06-03 ENCOUNTER — Other Ambulatory Visit: Payer: Self-pay

## 2019-06-03 ENCOUNTER — Encounter: Payer: Self-pay | Admitting: Podiatry

## 2019-06-03 DIAGNOSIS — M79674 Pain in right toe(s): Secondary | ICD-10-CM

## 2019-06-03 DIAGNOSIS — M79675 Pain in left toe(s): Secondary | ICD-10-CM

## 2019-06-03 DIAGNOSIS — B351 Tinea unguium: Secondary | ICD-10-CM

## 2019-06-03 DIAGNOSIS — E119 Type 2 diabetes mellitus without complications: Secondary | ICD-10-CM

## 2019-06-03 NOTE — Progress Notes (Signed)
Complaint:  Visit Type: Patient returns to my office for continued preventative foot care services. Complaint: Patient states" my nails have grown long and thick and become painful to walk and wear shoes" Patient has been diagnosed with DM with no foot complications. The patient presents for preventative foot care services. No changes to ROS  Podiatric Exam: Vascular: dorsalis pedis and posterior tibial pulses are palpable bilateral. Capillary return is immediate. Temperature gradient is WNL. Skin turgor WNL  Sensorium: Normal Semmes Weinstein monofilament test. Normal tactile sensation bilaterally. Nail Exam: Pt has thick disfigured discolored nails with subungual debris noted bilateral entire nail hallux .  Left hallux nail is attached only proximally to left hallux nail bed. Ulcer Exam: There is no evidence of ulcer or pre-ulcerative changes or infection. Orthopedic Exam: Muscle tone and strength are WNL. No limitations in general ROM. No crepitus or effusions noted. Foot type and digits show no abnormalities. Functional hallux limitus 1st MPJ  B/L. Skin: No Porokeratosis. No infection or ulcers  Diagnosis:  Onychomycosis, , Pain in right toe, pain in left toes  Treatment & Plan Procedures and Treatment: Consent by patient was obtained for treatment procedures.   Debridement of mycotic and hypertrophic toenails, 1 through 5 bilateral and clearing of subungual debris. No ulceration, no infection noted. Discussed left hallux nail with patient. Return Visit-Office Procedure: Patient instructed to return to the office for a follow up visit 3 months for continued evaluation and treatment.    Gardiner Barefoot DPM

## 2019-06-05 ENCOUNTER — Telehealth: Payer: Self-pay | Admitting: *Deleted

## 2019-06-05 DIAGNOSIS — Z122 Encounter for screening for malignant neoplasm of respiratory organs: Secondary | ICD-10-CM

## 2019-06-05 DIAGNOSIS — Z87891 Personal history of nicotine dependence: Secondary | ICD-10-CM

## 2019-06-05 NOTE — Telephone Encounter (Addendum)
Patient has been notified that lung cancer screening CT scan is due currently or will be in near future. Confirmed that patient is within the appropriate age range, and asymptomatic, (no signs or symptoms of lung cancer). Patient denies illness that would prevent curative treatment for lung cancer if found. Verified smoking history. She is a current smoker, smoking about one pack per day. She have been smoking for 57 years. Patient is agreeable for CT scan being scheduled. She does not have a preference for scheduling her next CT scan.

## 2019-06-10 NOTE — Addendum Note (Signed)
Addended by: Lieutenant Diego on: 06/10/2019 01:34 PM   Modules accepted: Orders

## 2019-06-10 NOTE — Telephone Encounter (Signed)
Smoking history current, 75 pack year

## 2019-06-12 ENCOUNTER — Other Ambulatory Visit: Payer: Self-pay

## 2019-06-12 ENCOUNTER — Ambulatory Visit
Admission: RE | Admit: 2019-06-12 | Discharge: 2019-06-12 | Disposition: A | Discharge status: Home / Self Care | Payer: Medicare Other | Source: Ambulatory Visit | Attending: Nurse Practitioner | Admitting: Nurse Practitioner

## 2019-06-12 DIAGNOSIS — Z122 Encounter for screening for malignant neoplasm of respiratory organs: Secondary | ICD-10-CM | POA: Diagnosis not present

## 2019-06-12 DIAGNOSIS — F17210 Nicotine dependence, cigarettes, uncomplicated: Secondary | ICD-10-CM | POA: Diagnosis not present

## 2019-06-12 DIAGNOSIS — Z87891 Personal history of nicotine dependence: Secondary | ICD-10-CM | POA: Insufficient documentation

## 2019-06-14 ENCOUNTER — Encounter: Payer: Self-pay | Admitting: *Deleted

## 2019-06-25 ENCOUNTER — Other Ambulatory Visit: Payer: Self-pay | Admitting: Internal Medicine

## 2019-07-02 ENCOUNTER — Other Ambulatory Visit: Payer: Self-pay | Admitting: Internal Medicine

## 2019-07-14 ENCOUNTER — Other Ambulatory Visit: Payer: Self-pay | Admitting: Internal Medicine

## 2019-08-25 ENCOUNTER — Other Ambulatory Visit: Payer: Self-pay | Admitting: Internal Medicine

## 2019-09-02 ENCOUNTER — Encounter: Payer: Self-pay | Admitting: Podiatry

## 2019-09-02 ENCOUNTER — Other Ambulatory Visit: Payer: Self-pay

## 2019-09-02 ENCOUNTER — Ambulatory Visit (INDEPENDENT_AMBULATORY_CARE_PROVIDER_SITE_OTHER): Payer: Medicare Other | Admitting: Podiatry

## 2019-09-02 DIAGNOSIS — M79674 Pain in right toe(s): Secondary | ICD-10-CM

## 2019-09-02 DIAGNOSIS — L600 Ingrowing nail: Secondary | ICD-10-CM | POA: Insufficient documentation

## 2019-09-02 DIAGNOSIS — D689 Coagulation defect, unspecified: Secondary | ICD-10-CM | POA: Diagnosis not present

## 2019-09-02 DIAGNOSIS — B351 Tinea unguium: Secondary | ICD-10-CM

## 2019-09-02 DIAGNOSIS — E119 Type 2 diabetes mellitus without complications: Secondary | ICD-10-CM | POA: Diagnosis not present

## 2019-09-02 DIAGNOSIS — M79675 Pain in left toe(s): Secondary | ICD-10-CM

## 2019-09-02 NOTE — Progress Notes (Signed)
Complaint:  Visit Type: Patient returns to my office for continued preventative foot care services. Complaint: Patient states" my nails have grown long and thick and become painful to walk and wear shoes" Patient has been diagnosed with DM with no foot complications. The patient presents for preventative foot care services. No changes to ROS  Podiatric Exam: Vascular: dorsalis pedis and posterior tibial pulses are palpable bilateral. Capillary return is immediate. Temperature gradient is WNL. Skin turgor WNL  Sensorium: Normal Semmes Weinstein monofilament test. Normal tactile sensation bilaterally. Nail Exam: Pt has thick disfigured discolored nails with subungual debris noted bilateral entire nail hallux .  Marked incurvation medial border left hallux. Ulcer Exam: There is no evidence of ulcer or pre-ulcerative changes or infection. Orthopedic Exam: Muscle tone and strength are WNL. No limitations in general ROM. No crepitus or effusions noted. Foot type and digits show no abnormalities. Functional hallux limitus 1st MPJ  B/L. Skin: No Porokeratosis. No infection or ulcers  Diagnosis:  Onychomycosis, , Pain in right toe, pain in left toes  Treatment & Plan Procedures and Treatment: Consent by patient was obtained for treatment procedures.   Debridement of mycotic and hypertrophic toenails, 1 through 5 bilateral and clearing of subungual debris. No ulceration, no infection noted. Discussed left hallux nail with patient.  To consider nail surgery in the future..  No longer taking blood thinner. Return Visit-Office Procedure: Patient instructed to return to the office for a follow up visit 10 weeks  for continued evaluation and treatment.    Gardiner Barefoot DPM

## 2019-09-13 ENCOUNTER — Other Ambulatory Visit: Payer: Self-pay | Admitting: Internal Medicine

## 2019-09-18 ENCOUNTER — Other Ambulatory Visit: Payer: Self-pay | Admitting: Internal Medicine

## 2019-09-20 ENCOUNTER — Other Ambulatory Visit: Payer: Self-pay | Admitting: Internal Medicine

## 2019-11-11 ENCOUNTER — Encounter: Payer: Self-pay | Admitting: Podiatry

## 2019-11-11 ENCOUNTER — Ambulatory Visit (INDEPENDENT_AMBULATORY_CARE_PROVIDER_SITE_OTHER): Payer: Medicare Other | Admitting: Podiatry

## 2019-11-11 ENCOUNTER — Other Ambulatory Visit: Payer: Self-pay

## 2019-11-11 DIAGNOSIS — M79674 Pain in right toe(s): Secondary | ICD-10-CM

## 2019-11-11 DIAGNOSIS — B351 Tinea unguium: Secondary | ICD-10-CM

## 2019-11-11 DIAGNOSIS — E119 Type 2 diabetes mellitus without complications: Secondary | ICD-10-CM | POA: Diagnosis not present

## 2019-11-11 DIAGNOSIS — M79675 Pain in left toe(s): Secondary | ICD-10-CM | POA: Diagnosis not present

## 2019-11-11 NOTE — Progress Notes (Signed)
This patient presents the office concerned about her's big toenail on her left.  She says the toenails not causing any pain but she is not pleased because she believes it is causing that whole area to become dry.  She denies any drainage from the nail.  She says she is not interested in surgical removal of the nail today.  She presents the office for evaluation and treatm  General Appearance  Alert, conversant and in no acute stress.  Vascular  Dorsalis pedis and posterior tibial  pulses are palpable  bilaterally.  Capillary return is within normal limits  bilaterally. Temperature is within normal limits  bilaterally.  Neurologic  Senn-Weinstein monofilament wire test within normal limits  bilaterally. Muscle power within normal limits bilaterally.  Nails Thick disfigured discolored nails with subungual debris  hallux nails  B/L.[name] The distal aspect of left hallux is unattached.  The nail under the proximal nail fold is starting to lift.  There is redness and swelling at proximal nail fold.   No evidence of bacterial infection or drainage bilaterally.  Orthopedic  No limitations of motion  feet .  No crepitus or effusions noted.  No bony pathology or digital deformities noted.  Functional hallux limitus 1st MPJ  B/L.    Skin  normotropic skin with no porokeratosis noted bilaterally.  No signs of infections or ulcers noted.    Onychomycosis Hallux nails  B/L  Nail Dystrophy left hallux.  ROV.  Debride nails left hallux.  Discussed this condition with this patient.  She says she is not interested in surgery but the distal aspect of the nail is causing the proximal nail to rise under the cuticle and therefore in the future she may need surgery.  The nail was debrided down distally to help to prevent further rising and lifting at the proximal nail fold.  She is to return to the office in 3 months and to consider surgical correction in the interim.   Gardiner Barefoot DPM

## 2019-12-12 ENCOUNTER — Telehealth: Payer: Self-pay | Admitting: Internal Medicine

## 2019-12-12 MED ORDER — ACCU-CHEK SOFTCLIX LANCETS MISC: 3 refills | Status: DC

## 2019-12-12 MED ORDER — ACCU-CHEK AVIVA PLUS VI STRP: ORAL_STRIP | 3 refills | Status: DC

## 2019-12-12 NOTE — Telephone Encounter (Signed)
Patient needs a new prescription for Accu-Chek Softclix Lancets lancets and glucose blood (ACCU-CHEK AVIVA PLUS) test strip.

## 2019-12-18 DIAGNOSIS — M79675 Pain in left toe(s): Secondary | ICD-10-CM | POA: Diagnosis not present

## 2019-12-18 DIAGNOSIS — B351 Tinea unguium: Secondary | ICD-10-CM | POA: Diagnosis not present

## 2019-12-18 DIAGNOSIS — E1142 Type 2 diabetes mellitus with diabetic polyneuropathy: Secondary | ICD-10-CM | POA: Diagnosis not present

## 2019-12-18 DIAGNOSIS — L603 Nail dystrophy: Secondary | ICD-10-CM | POA: Diagnosis not present

## 2019-12-18 DIAGNOSIS — I83893 Varicose veins of bilateral lower extremities with other complications: Secondary | ICD-10-CM | POA: Diagnosis not present

## 2019-12-19 ENCOUNTER — Other Ambulatory Visit: Payer: Self-pay | Admitting: Internal Medicine

## 2020-01-03 ENCOUNTER — Other Ambulatory Visit: Payer: Self-pay | Admitting: Internal Medicine

## 2020-01-10 ENCOUNTER — Other Ambulatory Visit: Payer: Self-pay | Admitting: Internal Medicine

## 2020-01-28 ENCOUNTER — Other Ambulatory Visit: Payer: Self-pay | Admitting: Internal Medicine

## 2020-02-22 ENCOUNTER — Other Ambulatory Visit: Payer: Self-pay | Admitting: Internal Medicine

## 2020-02-28 DIAGNOSIS — Z1152 Encounter for screening for COVID-19: Secondary | ICD-10-CM | POA: Diagnosis not present

## 2020-02-28 DIAGNOSIS — Z20828 Contact with and (suspected) exposure to other viral communicable diseases: Secondary | ICD-10-CM | POA: Diagnosis not present

## 2020-02-28 DIAGNOSIS — Z03818 Encounter for observation for suspected exposure to other biological agents ruled out: Secondary | ICD-10-CM | POA: Diagnosis not present

## 2020-03-10 ENCOUNTER — Other Ambulatory Visit: Payer: Self-pay | Admitting: Internal Medicine

## 2020-03-13 DIAGNOSIS — Z23 Encounter for immunization: Secondary | ICD-10-CM | POA: Diagnosis not present

## 2020-03-15 ENCOUNTER — Other Ambulatory Visit: Payer: Self-pay | Admitting: Internal Medicine

## 2020-03-23 ENCOUNTER — Ambulatory Visit: Payer: Medicare Other

## 2020-03-31 ENCOUNTER — Ambulatory Visit (INDEPENDENT_AMBULATORY_CARE_PROVIDER_SITE_OTHER): Payer: Medicare Other

## 2020-03-31 VITALS — Ht 66.0 in | Wt 167.0 lb

## 2020-03-31 DIAGNOSIS — Z0000 Encounter for general adult medical examination without abnormal findings: Secondary | ICD-10-CM

## 2020-03-31 NOTE — Progress Notes (Addendum)
Subjective:   Crystal Meyers is a 74 y.o. female who presents for Medicare Annual (Subsequent) preventive examination.  Review of Systems    No ROS.  Medicare Wellness Virtual Visit.   Cardiac Risk Factors include: advanced age (>88mn, >>70women);hypertension;diabetes mellitus     Objective:    Today's Vitals   03/31/20 0948  Weight: 167 lb (75.8 kg)  Height: _0  (1.676 m)   Body mass index is 26.95 kg/m.  Advanced Directives 03/31/2020 04/15/2019 04/12/2019 03/21/2019 04/13/2018 03/15/2018 04/14/2017  Does Patient Have a Medical Advance Directive? _1  Yes Yes  Type of AParamed[place]of [place]Living will [place]Living will Living will;Healthcare Power of Attorney Living will;Healthcare Power of Attorney Living will;Healthcare Power of [place]Living will [place]San JacintoLiving will  Does patient want to make changes to medical advance directive? No - Patient declined - - No - Patient declined No - Patient declined No - Patient declined No - Patient declined  Copy of [place] Chart? Yes - validated most recent copy scanned in chart (See row information) - No - copy requested Yes - validated most recent copy scanned in chart (See row information) Yes Yes No - copy requested  Would patient like information on creating a medical advance directive? - - - - - - -    Current Medications (verified) Outpatient Encounter Medications as of 03/31/2020  Medication Sig  . Accu-Chek Softclix Lancets lancets Use to check sugar once daily. Dx: E11.9  . aspirin EC 81 MG tablet Take 1 tablet (81 mg total) by mouth daily.  .Marland Kitchenatorvastatin (LIPITOR) 40 MG tablet TAKE 1 TABLET BY MOUTH EVERY DAY  . blood glucose meter kit and supplies Dispense based on patient and insurance preference. Use to check sugar once daily as directed. (FOR ICD-10 E10.9, E11.9).  .Marland Kitchenergocalciferol (VITAMIN  D2) 1.25 MG (50000 UT) capsule Take 1 capsule (50,000 Units total) by mouth once a week. (Patient not taking: Reported on 11/11/2019)  . glucose blood (ACCU-CHEK AVIVA PLUS) test strip Use to check sugar once daily. Dx. E11.9  . hydrochlorothiazide (HYDRODIURIL) 25 MG tablet TAKE 1 TABLET BY MOUTH EVERY DAY (Patient not taking: Reported on 03/31/2020)  . levothyroxine (SYNTHROID) 88 MCG tablet TAKE 1 TABLET (88 MCG TOTAL) BY MOUTH DAILY BEFORE BREAKFAST.  .Marland Kitchenlosartan (COZAAR) 100 MG tablet TAKE 1 TABLET BY MOUTH EVERYDAY AT BEDTIME  . metFORMIN (GLUCOPHAGE-XR) 500 MG 24 hr tablet TAKE 1 TABLET BY MOUTH EVERY DAY WITH BREAKFAST  . Multiple Vitamin (MULTIVITAMIN) tablet Take 1 tablet by mouth daily.  . nitroGLYCERIN (NITROSTAT) 0.4 MG SL tablet Place under the tongue.  .Marland Kitchenomeprazole (PRILOSEC) 40 MG capsule TAKE 1 CAPSULE BY MOUTH EVERY DAY  . rOPINIRole (REQUIP) 0.25 MG tablet TAKE 1 TABLET (0.25 MG TOTAL) BY MOUTH AT BEDTIME. MAY INCREASE WEEKLY AS NEEDED FOR RLS  . venlafaxine XR (EFFEXOR-XR) 150 MG 24 hr capsule TAKE 1 CAPSULE BY MOUTH EVERY DAY   No facility-administered encounter medications on file as of 03/31/2020.    Allergies (verified) Lamisil [terbinafine] and No known allergies   History: Past Medical History:  Diagnosis Date  . Barrett's esophagus   . Breast cancer [place] 7[place]   left , invasive lobular carcinoma  . Complication of anesthesia    difficulty waking up and sleep apnea, low sats  . COPD (chronic obstructive pulmonary disease) ([place]   . Coronary artery disease   .  Depression   . GERD (gastroesophageal reflux disease)   . Hypercholesteremia   . Hypertension   . Hypothyroidism   . Personal history of tobacco use, presenting hazards to health 05/06/2015  . Personal history of tobacco use, presenting hazards to health 05/06/2015  . Pneumonia    hx of  . PVC (premature ventricular contraction)   . Sleep apnea    wears CPAP nightly  . Trigeminal neuralgia    Past  Surgical History:  Procedure Laterality Date  . BREAST SURGERY Bilateral 2011   dbl mastectomy  . COLONOSCOPY W/ POLYPECTOMY    . CRANIECTOMY Right 01/21/2016   Procedure: Microvascular Decompression - right trigemminal nerve;  Surgeon: [name], MD;  Location: [place] [facility];  Service: Neurosurgery;  Laterality: Right;  . CRANIOTOMY Right 02/03/2016   Procedure: REPAIR OF CEREBROSPINAL FLUID LEAK AND HARVEST ABDOMINAL FAT GRAFT;  Surgeon: [name], MD;  Location: [place] [facility];  Service: Neurosurgery;  Laterality: Right;  . LUMBAR LAMINECTOMY/DECOMPRESSION MICRODISCECTOMY Left 08/11/2015   Procedure: Left Lumbar four-five microdiscectomy;  Surgeon: [name], MD;  Location: [place] [facility];  Service: Neurosurgery;  Laterality: Left;  [name] MASTECTOMY  05/2009   bilateral, reconstructive surgery 09/2009  . MASTECTOMY     Bilateral    Family History  Problem Relation Age of Onset  . Mental illness Mother   . Diabetes Mother   . Hypertension Mother   . Hyperlipidemia Mother   . Alzheimer's disease Mother   . Hypertension Father   . Hyperlipidemia Father   . Stroke Father   . Heart attack Father   . Diabetes Father   . Deep vein thrombosis Father   . Colon cancer Father   . Cancer Brother        Lung  . Diabetes Brother   . Diabetes Brother   . Stroke Maternal Grandmother    Social History   Socioeconomic History  . Marital status: Widowed    Spouse name: Not on file  . Number of children: Not on file  . Years of education: Not on file  . Highest education level: Not on file  Occupational History  . Not on file  Tobacco Use  . Smoking status: Current Every Day Smoker    Packs/day: 1.00    Years: 54.00    Pack years: 54.00    Types: Cigarettes  . Smokeless tobacco: Never Used  . Tobacco comment: Refused cessation material  Substance and Sexual Activity  . Alcohol use: No  . Drug use: No  . Sexual activity: Never  Other Topics Concern  . Not on file    Social History Narrative   Lives with mother.   Recently widowed.   Always uses seat belts   Has a bird and a dog.   No exercise.   Social Determinants of Health   Financial Resource Strain: Low Risk   . Difficulty of Paying Living Expenses: Not hard at all  Food Insecurity: No Food Insecurity  . Worried About Charity fundraiser in the Last Year: Never true  . Ran Out of Food in the Last Year: Never true  Transportation Needs: No Transportation Needs  . Lack of Transportation (Medical): No  . Lack of Transportation (Non-Medical): No  Physical Activity:   . Days of Exercise per Week: Not on file  . Minutes of Exercise per Session: Not on file  Stress: No Stress Concern Present  . Feeling of Stress : Not at all  Social Connections:   .  Frequency of Communication with Friends and Family: Not on file  . Frequency of Social Gatherings with Friends and Family: Not on file  . Attends Religious Services: Not on file  . Active Member of Clubs or Organizations: Not on file  . Attends Archivist Meetings: Not on file  . Marital Status: Not on file    Tobacco Counseling Ready to quit: Not Answered Counseling given: Not Answered Comment: Refused cessation material   Clinical Intake:  Pre-visit preparation completed: Yes        Diabetes: Yes (Followed by pcp)  How often do you need to have someone help you when you read instructions, pamphlets, or other written materials from your doctor or pharmacy?: 1 - Never Interpreter Needed?: No      Activities of Daily Living In your present state of health, do you have any difficulty performing the following activities: 03/31/2020  Hearing? N  Vision? N  Difficulty concentrating or making decisions? N  Walking or climbing stairs? N  Dressing or bathing? N  Doing errands, shopping? N  Preparing Food and eating ? N  Using the Toilet? N  In the past six months, have you accidently leaked urine? N  Do you have  problems with loss of bowel control? N  Managing your Medications? N  Managing your Finances? N  Housekeeping or managing your Housekeeping? N  Some recent data might be hidden    Patient Care Team: [name], MD as PCP - General (Internal Medicine) [name], MD (Unknown Physician Specialty)  Indicate any recent Medical Services you may have received from other than [facility] providers in the past year (date may be approximate).     Assessment:   This is a routine wellness examination for Reyna.  I connected with [place] today by telephone and verified that I am speaking with the correct person using two identifiers. Location patient: home Location provider: work Persons participating in the virtual visit: patient, Marine scientist.    I discussed the limitations, risks, security and privacy concerns of performing an evaluation and management service by telephone and the availability of in person appointments. The patient expressed understanding and verbally consented to this telephonic visit.    Interactive audio and video telecommunications were attempted between this provider and patient, however failed, due to patient having technical difficulties OR patient did not have access to video capability.  We continued and completed visit with audio only.  Some vital signs may be absent or patient reported.   Hearing/Vision screen  Hearing Screening   _0  _1  _2  _3  _4  _5  _6  _7  _8   Right ear:           Left ear:           Comments: Patient is able to hear conversational tones without difficulty.  No issues reported.  Vision Screening Comments: Followed by [facility], [place] No retinopathy reported Cataract extraction, bilateral Visual acuity not assessed, virtual visit.  They have seen their ophthalmologist in the last 12 months.     Dietary issues and exercise activities discussed: Current Exercise Habits: Home exercise routine, Type of  exercise: walking;calisthenics, Intensity: Mild   Regular diet Good water intake  Goals      Lifestyle   .  Increase physical activity (pt-stated)      start walking for exercise    .  Quit Smoking      Decrease the amount of cigarettes smoked by 1 per day  Depression Screen PHQ 2/9 Scores 03/31/2020 03/21/2019 03/15/2018 12/06/2016 09/05/2016 02/09/2015 11/25/2013  PHQ - 2 Score 0 0 0 0 2 0 0    Fall Risk Fall Risk  03/31/2020 03/21/2019 03/21/2019 03/15/2018 12/06/2016  Falls in the past year? 0 0 0 Yes No  Number falls in past yr: 0 - - 1 -  Injury with Fall? - - - No -  Follow up Falls evaluation completed - Falls evaluation completed - -   Handrails in use when climbing stairs? Yes Home free of loose throw rugs in walkways, pet beds, electrical cords, etc? Yes  Adequate lighting in your home to reduce risk of falls? Yes   ASSISTIVE DEVICES UTILIZED TO PREVENT FALLS: Use of a cane, walker or w/c? No   TIMED UP AND GO: Was the test performed? No . Virtual visit.   Cognitive Function:      6CIT Screen 03/31/2020 03/21/2019 03/15/2018 12/06/2016  What Year? 0 points 0 points 0 points 0 points  What month? 0 points 0 points 0 points 0 points  What time? - 0 points 0 points 0 points  Count back from 20 0 points 0 points 0 points 0 points  Months in reverse 0 points 0 points 0 points 0 points  Repeat phrase 0 points 0 points 0 points 0 points  Total Score - 0 0 0    Immunizations Immunization History  Administered Date(s) Administered  . Fluad Quad(high Dose 65+) 04/10/2019  . Influenza, High Dose Seasonal PF 06/09/2017, 03/15/2018  . Influenza,inj,Quad PF,6+ Mos 09/05/2016  . PFIZER SARS-COV-2 Vaccination 02/19/2020, 03/13/2020  . Pneumococcal Conjugate-13 11/25/2013  . Pneumococcal Polysaccharide-23 11/26/2006, 07/03/2015  . Tdap 04/13/2018    Health Maintenance Health Maintenance  Topic Date Due  . HEMOGLOBIN A1C  10/08/2019  . OPHTHALMOLOGY EXAM  12/25/2019  .  INFLUENZA VACCINE  04/08/2020 (Originally 02/09/2020)  . FOOT EXAM  11/10/2020  . COLONOSCOPY  09/10/2024  . TETANUS/TDAP  04/13/2028  . DEXA SCAN  Completed  . COVID-19 Vaccine  Completed  . Hepatitis C Screening  Completed  . PNA vac Low Risk Adult  Completed   No longer taking hydrochlorothiazide; monitors blood pressure.  Eye exam- scheduled in a couple of weeks with [facility].   Dental Screening: Recommended annual dental exams for proper oral hygiene.  Community Resource Referral / Chronic Care Management: CRR required this visit?  No   CCM required this visit?  No      Plan:   Keep all routine maintenance appointments.   Follow up 04/08/20 @ 10:00  I have personally reviewed and noted the following in the patient's chart:   . Medical and social history . Use of alcohol, tobacco or illicit drugs  . Current medications and supplements . Functional ability and status . Nutritional status . Physical activity . Advanced directives . List of other physicians . Hospitalizations, surgeries, and ER visits in previous 12 months . Vitals . Screenings to include cognitive, depression, and falls . Referrals and appointments  In addition, I have reviewed and discussed with patient certain preventive protocols, quality metrics, and best practice recommendations. A written personalized care plan for preventive services as well as general preventive health recommendations were provided to patient via mychart.     [name], MD   04/04/2020     I have reviewed the above information and agree with above.   Deborra Medina, MD

## 2020-03-31 NOTE — Patient Instructions (Addendum)
[name] , Thank you for taking time to come for your Medicare Wellness Visit. I appreciate your ongoing commitment to your health goals. Please review the following plan we discussed and let me know if I can assist you in the future.   These are the goals we discussed: Goals      Patient Stated   .  Increase physical activity (pt-stated)      start walking for exercise      Other   .  Quit Smoking      Decrease the amount of cigarettes smoked by 1 per day       This is a list of the screening recommended for you and due dates:  Health Maintenance  Topic Date Due  . Hemoglobin A1C  10/08/2019  . Eye exam for diabetics  12/25/2019  . Flu Shot  04/08/2020*  . Complete foot exam   11/10/2020  . Colon Cancer Screening  09/10/2024  . Tetanus Vaccine  04/13/2028  . DEXA scan (bone density measurement)  Completed  . COVID-19 Vaccine  Completed  .  Hepatitis C: One time screening is recommended by Center for Disease Control  (CDC) for  adults born from [birth year] through 1963.   Completed  . Pneumonia vaccines  Completed  *Topic was postponed. The date shown is not the original due date.    Immunizations Immunization History  Administered Date(s) Administered  . Fluad Quad(high Dose 65+) 04/10/2019  . Influenza, High Dose Seasonal PF 06/09/2017, 03/15/2018  . Influenza,inj,Quad PF,6+ Mos 09/05/2016  . Pneumococcal Conjugate-13 11/25/2013  . Pneumococcal Polysaccharide-23 11/26/2006, 07/03/2015  . Tdap 04/13/2018   Keep all routine maintenance appointments.   Follow up 04/08/20 @ 10:00  Advanced directives: yes, on file  Follow up in one year for your annual wellness visit    Preventive Care 65 Years and Older, Female Preventive care refers to lifestyle choices and visits with your health care provider that can promote health and wellness. What does preventive care include?  A yearly physical exam. This is also called an annual well check.  Dental exams once or twice a  year.  Routine eye exams. Ask your health care provider how often you should have your eyes checked.  Personal lifestyle choices, including:  Daily care of your teeth and gums.  Regular physical activity.  Eating a healthy diet.  Avoiding tobacco and drug use.  Limiting alcohol use.  Practicing safe sex.  Taking low-dose aspirin every day.  Taking vitamin and mineral supplements as recommended by your health care provider. What happens during an annual well check? The services and screenings done by your health care provider during your annual well check will depend on your age, overall health, lifestyle risk factors, and family history of disease. Counseling  Your health care provider may ask you questions about your:  Alcohol use.  Tobacco use.  Drug use.  Emotional well-being.  Home and relationship well-being.  Sexual activity.  Eating habits.  History of falls.  Memory and ability to understand (cognition).  Work and work Statistician.  Reproductive health. Screening  You may have the following tests or measurements:  Height, weight, and BMI.  Blood pressure.  Lipid and cholesterol levels. These may be checked every 5 years, or more frequently if you are over 79 years old.  Skin check.  Lung cancer screening. You may have this screening every year starting at age 22 if you have a 30-pack-year history of smoking and currently smoke or have  quit within the past 15 years.  Fecal occult blood test (FOBT) of the stool. You may have this test every year starting at age 39.  Flexible sigmoidoscopy or colonoscopy. You may have a sigmoidoscopy every 5 years or a colonoscopy every 10 years starting at age 41.  Hepatitis C blood test.  Hepatitis B blood test.  Sexually transmitted disease (STD) testing.  Diabetes screening. This is done by checking your blood sugar (glucose) after you have not eaten for a while (fasting). You may have this done every 1-3  years.  Bone density scan. This is done to screen for osteoporosis. You may have this done starting at age 48.  Mammogram. This may be done every 1-2 years. Talk to your health care provider about how often you should have regular mammograms. Talk with your health care provider about your test results, treatment options, and if necessary, the need for more tests. Vaccines  Your health care provider may recommend certain vaccines, such as:  Influenza vaccine. This is recommended every year.  Tetanus, diphtheria, and acellular pertussis (Tdap, Td) vaccine. You may need a Td booster every 10 years.  Zoster vaccine. You may need this after age 68.  Pneumococcal 13-valent conjugate (PCV13) vaccine. One dose is recommended after age 31.  Pneumococcal polysaccharide (PPSV23) vaccine. One dose is recommended after age 72. Talk to your health care provider about which screenings and vaccines you need and how often you need them. This information is not intended to replace advice given to you by your health care provider. Make sure you discuss any questions you have with your health care provider. Document Released: 07/24/2015 Document Revised: 03/16/2016 Document Reviewed: 04/28/2015 Elsevier Interactive Patient Education  2017 [place] Prevention in the Home Falls can cause injuries. They can happen to people of all ages. There are many things you can do to make your home safe and to help prevent falls. What can I do on the outside of my home?  Regularly fix the edges of walkways and driveways and fix any cracks.  Remove anything that might make you trip as you walk through a door, such as a raised step or threshold.  Trim any bushes or trees on the path to your home.  Use bright outdoor lighting.  Clear any walking paths of anything that might make someone trip, such as rocks or tools.  Regularly check to see if handrails are loose or broken. Make sure that both sides of any  steps have handrails.  Any raised decks and porches should have guardrails on the edges.  Have any leaves, snow, or ice cleared regularly.  Use sand or salt on walking paths during winter.  Clean up any spills in your garage right away. This includes oil or grease spills. What can I do in the bathroom?  Use night lights.  Install grab bars by the toilet and in the tub and shower. Do not use towel bars as grab bars.  Use non-skid mats or decals in the tub or shower.  If you need to sit down in the shower, use a plastic, non-slip stool.  Keep the floor dry. Clean up any water that spills on the floor as soon as it happens.  Remove soap buildup in the tub or shower regularly.  Attach bath mats securely with double-sided non-slip rug tape.  Do not have throw rugs and other things on the floor that can make you trip. What can I do in the bedroom?  Use night lights.  Make sure that you have a light by your bed that is easy to reach.  Do not use any sheets or blankets that are too big for your bed. They should not hang down onto the floor.  Have a firm chair that has side arms. You can use this for support while you get dressed.  Do not have throw rugs and other things on the floor that can make you trip. What can I do in the kitchen?  Clean up any spills right away.  Avoid walking on wet floors.  Keep items that you use a lot in easy-to-reach places.  If you need to reach something above you, use a strong step stool that has a grab bar.  Keep electrical cords out of the way.  Do not use floor polish or wax that makes floors slippery. If you must use wax, use non-skid floor wax.  Do not have throw rugs and other things on the floor that can make you trip. What can I do with my stairs?  Do not leave any items on the stairs.  Make sure that there are handrails on both sides of the stairs and use them. Fix handrails that are broken or loose. Make sure that handrails are  as long as the stairways.  Check any carpeting to make sure that it is firmly attached to the stairs. Fix any carpet that is loose or worn.  Avoid having throw rugs at the top or bottom of the stairs. If you do have throw rugs, attach them to the floor with carpet tape.  Make sure that you have a light switch at the top of the stairs and the bottom of the stairs. If you do not have them, ask someone to add them for you. What else can I do to help prevent falls?  Wear shoes that:  Do not have high heels.  Have rubber bottoms.  Are comfortable and fit you well.  Are closed at the toe. Do not wear sandals.  If you use a stepladder:  Make sure that it is fully opened. Do not climb a closed stepladder.  Make sure that both sides of the stepladder are locked into place.  Ask someone to hold it for you, if possible.  Clearly mark and make sure that you can see:  Any grab bars or handrails.  First and last steps.  Where the edge of each step is.  Use tools that help you move around (mobility aids) if they are needed. These include:  Canes.  Walkers.  Scooters.  Crutches.  Turn on the lights when you go into a dark area. Replace any light bulbs as soon as they burn out.  Set up your furniture so you have a clear path. Avoid moving your furniture around.  If any of your floors are uneven, fix them.  If there are any pets around you, be aware of where they are.  Review your medicines with your doctor. Some medicines can make you feel dizzy. This can increase your chance of falling. Ask your doctor what other things that you can do to help prevent falls. This information is not intended to replace advice given to you by your health care provider. Make sure you discuss any questions you have with your health care provider. Document Released: 04/23/2009 Document Revised: 12/03/2015 Document Reviewed: 08/01/2014 Elsevier Interactive Patient Education  2017 [employer].

## 2020-04-08 ENCOUNTER — Encounter: Payer: Self-pay | Admitting: Internal Medicine

## 2020-04-08 ENCOUNTER — Other Ambulatory Visit: Payer: Self-pay

## 2020-04-08 ENCOUNTER — Ambulatory Visit (INDEPENDENT_AMBULATORY_CARE_PROVIDER_SITE_OTHER): Payer: Medicare Other | Admitting: Internal Medicine

## 2020-04-08 VITALS — BP 102/66 | HR 79 | Temp 98.2°F | Resp 16 | Ht 66.0 in | Wt 168.2 lb

## 2020-04-08 DIAGNOSIS — Z800 Family history of malignant neoplasm of digestive organs: Secondary | ICD-10-CM | POA: Diagnosis not present

## 2020-04-08 DIAGNOSIS — Z720 Tobacco use: Secondary | ICD-10-CM | POA: Diagnosis not present

## 2020-04-08 DIAGNOSIS — E663 Overweight: Secondary | ICD-10-CM | POA: Diagnosis not present

## 2020-04-08 DIAGNOSIS — I2510 Atherosclerotic heart disease of native coronary artery without angina pectoris: Secondary | ICD-10-CM | POA: Diagnosis not present

## 2020-04-08 DIAGNOSIS — Z23 Encounter for immunization: Secondary | ICD-10-CM

## 2020-04-08 DIAGNOSIS — K22719 Barrett's esophagus with dysplasia, unspecified: Secondary | ICD-10-CM | POA: Diagnosis not present

## 2020-04-08 DIAGNOSIS — I2584 Coronary atherosclerosis due to calcified coronary lesion: Secondary | ICD-10-CM

## 2020-04-08 DIAGNOSIS — E039 Hypothyroidism, unspecified: Secondary | ICD-10-CM

## 2020-04-08 DIAGNOSIS — I10 Essential (primary) hypertension: Secondary | ICD-10-CM

## 2020-04-08 DIAGNOSIS — E119 Type 2 diabetes mellitus without complications: Secondary | ICD-10-CM | POA: Diagnosis not present

## 2020-04-08 DIAGNOSIS — Z0000 Encounter for general adult medical examination without abnormal findings: Secondary | ICD-10-CM | POA: Diagnosis not present

## 2020-04-08 DIAGNOSIS — G4733 Obstructive sleep apnea (adult) (pediatric): Secondary | ICD-10-CM

## 2020-04-08 DIAGNOSIS — Z9989 Dependence on other enabling machines and devices: Secondary | ICD-10-CM

## 2020-04-08 LAB — COMPREHENSIVE METABOLIC PANEL
ALT: 23 U/L (ref 0–35)
AST: 22 U/L (ref 0–37)
Albumin: 4.2 g/dL (ref 3.5–5.2)
Alkaline Phosphatase: 41 U/L (ref 39–117)
BUN: 18 mg/dL (ref 6–23)
CO2: 30 mEq/L (ref 19–32)
Calcium: 9.5 mg/dL (ref 8.4–10.5)
Chloride: 103 mEq/L (ref 96–112)
Creatinine, Ser: 0.98 mg/dL (ref 0.40–1.20)
GFR: 55.81 mL/min — ABNORMAL LOW (ref >60.00)
Glucose, Bld: 100 mg/dL — ABNORMAL HIGH (ref 70–99)
Potassium: 4 mEq/L (ref 3.5–5.1)
Sodium: 140 mEq/L (ref 135–145)
Total Bilirubin: 0.4 mg/dL (ref 0.2–1.2)
Total Protein: 6.5 g/dL (ref 6.0–8.3)

## 2020-04-08 LAB — MICROALBUMIN / CREATININE URINE RATIO
Creatinine,U: 168.8 mg/dL
Microalb Creat Ratio: 0.4 mg/g (ref 0.0–30.0)
Microalb, Ur: 0.7 mg/dL (ref 0.0–1.9)

## 2020-04-08 LAB — LIPID PANEL
Cholesterol: 120 mg/dL (ref 0–200)
HDL: 46.5 mg/dL (ref >39.00)
LDL Cholesterol: 47 mg/dL (ref 0–99)
NonHDL: 73.82
Total CHOL/HDL Ratio: 3
Triglycerides: 132 mg/dL (ref 0.0–149.0)
VLDL: 26.4 mg/dL (ref 0.0–40.0)

## 2020-04-08 LAB — HEMOGLOBIN A1C: Hgb A1c MFr Bld: 6 % (ref 4.6–6.5)

## 2020-04-08 LAB — TSH: TSH: 2.41 u[IU]/mL (ref 0.35–4.50)

## 2020-04-08 NOTE — Progress Notes (Signed)
Subjective:  Patient ID: Crystal Meyers, female    DOB: [birth year]  Age: 74 y.o. MRN: [id number]  CC: The primary encounter diagnosis was Tobacco abuse. Diagnoses of Acquired hypothyroidism, Coronary artery disease due to calcified coronary lesion, Diabetes mellitus without complication ([place]), Need for immunization against influenza, Overweight (BMI 25.0-29.9), OSA on CPAP, Essential hypertension, Barrett's esophagus with dysplasia, Routine general medical examination at a health care facility, and Family history of colon cancer requiring screening colonoscopy were also pertinent to this visit.  HPI Crystal Meyers presents for follow up on chronic issues.  Last seen one year ago.   This visit occurred during the SARS-CoV-2 public health emergency.  Safety protocols were in place, including screening questions prior to the visit, additional usage of staff PPE, and extensive cleaning of exam room while observing appropriate contact time as indicated for disinfecting solutions.    T2DM:  She  feels generally well,  But is not  exercising regularly, but has been successful trying to lose weight. has not been  Checking  blood sugars . Has lost 35 lbs by following a Diet  Using Orgain potein powder shakes twice daily , snacks on apples,  Simple dinner (pinto beans,  A salad from [facility]) .  Denies any recent hypoglyemic events.  Taking   medications as directed. Following a carbohydrate modified diet 6 days per week. Denies numbness, burning and tingling of extremities. Appetite is good.  Walking daily with grandson,  No claudication symptoms.    Trigeminal neuralgia involving the right upper lip has returned.  No meds requested, does not want to repeat the surgery    Smoking a pack daily  Does not want to quit "it makes me happy.  The damage is done"  Having wrinkling of her left breast implant.  Wants to see a Set designer..  Recommended Crystal Meyers .      Outpatient  Medications Prior to Visit  Medication Sig Dispense Refill  . Accu-Chek Softclix Lancets lancets Use to check sugar once daily. Dx: E11.9 100 each 3  . atorvastatin (LIPITOR) 40 MG tablet TAKE 1 TABLET BY MOUTH EVERY DAY 90 tablet 1  . blood glucose meter kit and supplies Dispense based on patient and insurance preference. Use to check sugar once daily as directed. (FOR ICD-10 E10.9, E11.9). 1 each 0  . glucose blood (ACCU-CHEK AVIVA PLUS) test strip Use to check sugar once daily. Dx. E11.9 100 each 3  . levothyroxine (SYNTHROID) 88 MCG tablet TAKE 1 TABLET (88 MCG TOTAL) BY MOUTH DAILY BEFORE BREAKFAST. 90 tablet 0  . losartan (COZAAR) 100 MG tablet TAKE 1 TABLET BY MOUTH EVERYDAY AT BEDTIME 90 tablet 3  . metFORMIN (GLUCOPHAGE-XR) 500 MG 24 hr tablet TAKE 1 TABLET BY MOUTH EVERY DAY WITH BREAKFAST 90 tablet 1  . Multiple Vitamin (MULTIVITAMIN) tablet Take 1 tablet by mouth daily.    . nitroGLYCERIN (NITROSTAT) 0.4 MG SL tablet Place under the tongue.    [name] omeprazole (PRILOSEC) 40 MG capsule TAKE 1 CAPSULE BY MOUTH EVERY DAY 90 capsule 1  . rOPINIRole (REQUIP) 0.25 MG tablet TAKE 1 TABLET (0.25 MG TOTAL) BY MOUTH AT BEDTIME. MAY INCREASE WEEKLY AS NEEDED FOR RLS    . venlafaxine XR (EFFEXOR-XR) 150 MG 24 hr capsule TAKE 1 CAPSULE BY MOUTH EVERY DAY 90 capsule 1  . aspirin EC 81 MG tablet Take 1 tablet (81 mg total) by mouth daily. (Patient not taking: Reported on 04/08/2020) 90 tablet 1  .  hydrochlorothiazide (HYDRODIURIL) 25 MG tablet TAKE 1 TABLET BY MOUTH EVERY DAY (Patient not taking: Reported on 03/31/2020) 90 tablet 3  . ergocalciferol (VITAMIN D2) 1.25 MG (50000 UT) capsule Take 1 capsule (50,000 Units total) by mouth once a week. (Patient not taking: Reported on 11/11/2019) 12 capsule 1   No facility-administered medications prior to visit.    Review of Systems;  Patient denies headache, fevers, malaise, unintentional weight loss, skin rash, eye pain, sinus congestion and sinus pain, sore  throat, dysphagia,  hemoptysis , cough, dyspnea, wheezing, chest pain, palpitations, orthopnea, edema, abdominal pain, nausea, melena, diarrhea, constipation, flank pain, dysuria, hematuria, urinary  Frequency, nocturia, numbness, tingling, seizures,  Focal weakness, Loss of consciousness,  Tremor, insomnia, depression, anxiety, and suicidal ideation.      Objective:  BP 102/66 (BP Location: Right Arm, Patient Position: Sitting, Cuff Size: Normal)   Pulse 79   Temp 98.2 F (36.8 C) (Oral)   Resp 16   Ht '5\' 6"'  (1.676 m)   Wt 168 lb 3.2 oz (76.3 kg)   SpO2 96%   BMI 27.15 kg/m   BP Readings from Last 3 Encounters:  04/08/20 102/66  04/15/19 112/69  04/04/19 128/70    Wt Readings from Last 3 Encounters:  04/08/20 168 lb 3.2 oz (76.3 kg)  03/31/20 167 lb (75.8 kg)  06/12/19 195 lb (88.5 kg)    General appearance: alert, cooperative and appears stated age Ears: normal TM's and external ear canals both ears Throat: lips, mucosa, and tongue normal; teeth and gums normal Neck: no adenopathy, no carotid bruit, supple, symmetrical, trachea midline and thyroid not enlarged, symmetric, no tenderness/mass/nodules Back: symmetric, no curvature. ROM normal. No CVA tenderness. Lungs: clear to auscultation bilaterally Heart: regular rate and rhythm, S1, S2 normal, no murmur, click, rub or gallop Abdomen: soft, non-tender; bowel sounds normal; no masses,  no organomegaly Pulses: 2+ and symmetric Skin: Skin color, texture, turgor normal. No rashes or lesions Lymph nodes: Cervical, supraclavicular, and axillary nodes normal.  Lab Results  Component Value Date   HGBA1C 6.0 04/08/2020   HGBA1C 6.9 (H) 04/10/2019   HGBA1C 7.4 (H) 12/25/2018    Lab Results  Component Value Date   CREATININE 0.98 04/08/2020   CREATININE 1.12 (H) 04/15/2019   CREATININE 1.01 04/10/2019    Lab Results  Component Value Date   WBC 7.9 04/15/2019   HGB 13.2 04/15/2019   HCT 38.9 04/15/2019   PLT 274  04/15/2019   GLUCOSE 100 (H) 04/08/2020   CHOL 120 04/08/2020   TRIG 132.0 04/08/2020   HDL 46.50 04/08/2020   LDLDIRECT 71.0 08/31/2016   LDLCALC 47 04/08/2020   ALT 23 04/08/2020   AST 22 04/08/2020   NA 140 04/08/2020   K 4.0 04/08/2020   CL 103 04/08/2020   CREATININE 0.98 04/08/2020   BUN 18 04/08/2020   CO2 30 04/08/2020   TSH 2.41 04/08/2020   HGBA1C 6.0 04/08/2020   MICROALBUR 0.7 04/08/2020    CT CHEST LUNG CANCER SCREENING LOW DOSE WO CONTRAST  Result Date: 06/12/2019 CLINICAL DATA:  74 year old asymptomatic female current smoker with 75 pack-year smoking history. EXAM: CT CHEST WITHOUT CONTRAST LOW-DOSE FOR LUNG CANCER SCREENING TECHNIQUE: Multidetector CT imaging of the chest was performed following the standard protocol without IV contrast. COMPARISON:  06/05/2018 screening chest CT. FINDINGS: Cardiovascular: Normal heart size. No significant pericardial effusion/thickening. Left anterior descending coronary atherosclerosis. Atherosclerotic nonaneurysmal thoracic aorta. Normal caliber pulmonary arteries. Mediastinum/Nodes: No discrete thyroid nodules. Unremarkable esophagus. No pathologically  enlarged axillary, mediastinal or hilar lymph nodes, noting limited sensitivity for the detection of hilar adenopathy on this noncontrast study. Lungs/Pleura: No pneumothorax. No pleural effusion. Minimal centrilobular and paraseptal emphysema. No acute consolidative airspace disease or lung masses. No significant growth of previously visualized scattered pulmonary nodules. New tiny medial right middle lobe solid pulmonary nodule measuring 3.2 mm in volume derived mean diameter (series 3/image 241). No additional new significant pulmonary nodules. Upper abdomen: Subcentimeter hypodense lateral segment left liver lobe lesion is too small to characterize and is unchanged, considered benign. Diffuse hepatic steatosis. Musculoskeletal: No aggressive appearing focal osseous lesions. Bilateral  breast prostheses. Mild thoracic spondylosis. IMPRESSION: 1. Lung-RADS 2, benign appearance or behavior. Continue annual screening with low-dose chest CT without contrast in 12 months. 2. One vessel coronary atherosclerosis. 3. Diffuse hepatic steatosis. Aortic Atherosclerosis (ICD10-I70.0) and Emphysema (ICD10-J43.9). Electronically Signed   By: Ilona Sorrel M.D.   On: 06/12/2019 15:23    Assessment & Plan:   Problem List Items Addressed This Visit      Unprioritized   Coronary artery disease due to calcified coronary lesion   Relevant Orders   Lipid panel (Completed)   Hypothyroidism   Relevant Orders   TSH (Completed)   Barrett's esophagus    She is reminded to continue a daily dose of omeprazole.  Last EGD was done in 2016; no repeat needed per Vira Agar       Diabetes mellitus without complication ([place])    Excellent glycemic control on metformin XR 500 mg daily , likely improved with weight loss.  Continue metformin, losartan. . Statin and aspirin daily dose reduced to 2 times per week   Lab Results  Component Value Date   HGBA1C 6.0 04/08/2020         Relevant Orders   Hemoglobin A1c (Completed)   Comprehensive metabolic panel (Completed)   Microalbumin / creatinine urine ratio (Completed)   Essential hypertension    She is at goal without hctz 25 mg daily ; losartan 100 mg daily   Lab Results  Component Value Date   CREATININE 0.98 04/08/2020   Lab Results  Component Value Date   NA 140 04/08/2020   K 4.0 04/08/2020   CL 103 04/08/2020   CO2 30 04/08/2020   Lab Results  Component Value Date   MICROALBUR 0.7 04/08/2020         Family history of colon cancer requiring screening colonoscopy    Last colonoscopy ws in 2016 and 5 yr follow up was advised       Relevant Orders   Ambulatory referral to Gastroenterology   OSA on CPAP    Diagnosed remotely by sleep study. She is wearing her CPAP every night a minimum of 6 hours per night and notes improved  daytime wakefulness and decreased fatigue       Overweight (BMI 25.0-29.9)    I have congratulated her in reduction of   BMI and encouraged  Continued weight loss with goal of 10% of body weigh over the next 6 months using a low glycemic index diet and regular exercise a minimum of 5 days per week.        Routine general medical examination at a health care facility    age appropriate education and counseling updated, referrals for preventative services and immunizations addressed, dietary and smoking counseling addressed, most recent labs reviewed.  I have personally reviewed and have noted:  1) the patient's medical and social history 2) The pt's use of  alcohol, tobacco, and illicit drugs 3) The patient's current medications and supplements 4) Functional ability including ADL's, fall risk, home safety risk, hearing and visual impairment 5) Diet and physical activities 6) Evidence for depression or mood disorder 7) The patient's height, weight, and BMI have been recorded in the chart  I have made referrals, and provided counseling and education based on review of the above      Tobacco abuse - Primary    Again Urged to quit  Smoking using a gradual reduction .  [name]  Prior intolerance to chantix and wellbutrin. . She ahs no interest in quitting and is aware of the risks        Other Visit Diagnoses    Need for immunization against influenza       Relevant Orders   Flu Vaccine QUAD High Dose(Fluad) (Completed)      I have discontinued Kimbery D. Genter's ergocalciferol. I am also having her maintain her nitroGLYCERIN, rOPINIRole, aspirin EC, blood glucose meter kit and supplies, hydrochlorothiazide, multivitamin, Accu-Chek Aviva Plus, Accu-Chek Softclix Lancets, atorvastatin, omeprazole, losartan, metFORMIN, venlafaxine XR, and levothyroxine.  No orders of the defined types were placed in this encounter.   Medications Discontinued During This Encounter  Medication Reason  .  ergocalciferol (VITAMIN D2) 1.25 MG (50000 UT) capsule     Follow-up: Return in about 3 months (around 07/08/2020).   [name], MD

## 2020-04-08 NOTE — Patient Instructions (Addendum)
Congratulations on the weight loss!  If you need some more meal alternatives, try the Healthy Choice  low carb power bowls; they come  in at least  5 varieties,  Using  riced cauliflower instead of rice   Ok to use miralax and/or stool softener .  Take mirlax 2 hours 1 hour before or 2 hours after other meds   Referral to [facility] Licensed conveyancer ( in [place] for the breast implant issue)

## 2020-04-09 DIAGNOSIS — Z800 Family history of malignant neoplasm of digestive organs: Secondary | ICD-10-CM | POA: Insufficient documentation

## 2020-04-09 NOTE — Assessment & Plan Note (Signed)
She is reminded to continue a daily dose of omeprazole.  Last EGD was done in 2016; no repeat needed per [place]  

## 2020-04-09 NOTE — Assessment & Plan Note (Signed)
I have congratulated her in reduction of   BMI and encouraged  Continued weight loss with goal of 10% of body weigh over the next 6 months using a low glycemic index diet and regular exercise a minimum of 5 days per week.    

## 2020-04-09 NOTE — Assessment & Plan Note (Signed)
She is at goal without hctz 25 mg daily ; losartan 100 mg daily   Lab Results  Component Value Date   CREATININE 0.98 04/08/2020   Lab Results  Component Value Date   NA 140 04/08/2020   K 4.0 04/08/2020   CL 103 04/08/2020   CO2 30 04/08/2020   Lab Results  Component Value Date   MICROALBUR 0.7 04/08/2020

## 2020-04-09 NOTE — Assessment & Plan Note (Signed)
Last colonoscopy ws in 2016 and 5 yr follow up was advised

## 2020-04-09 NOTE — Assessment & Plan Note (Signed)
Again Urged to quit  Smoking using a gradual reduction .  [name]  Prior intolerance to chantix and wellbutrin. . She ahs no interest in quitting and is aware of the risks

## 2020-04-09 NOTE — Assessment & Plan Note (Signed)

## 2020-04-09 NOTE — Assessment & Plan Note (Addendum)
Excellent glycemic control on metformin XR 500 mg daily , likely improved with weight loss.  Continue metformin, losartan. . Statin and aspirin daily dose reduced to 2 times per week   Lab Results  Component Value Date   HGBA1C 6.0 04/08/2020

## 2020-04-09 NOTE — Assessment & Plan Note (Signed)
Diagnosed remotely by sleep study. She is wearing her CPAP every night a minimum of 6 hours per night and notes improved daytime wakefulness and decreased fatigue

## 2020-04-14 ENCOUNTER — Inpatient Hospital Stay: Payer: Medicare Other | Admitting: Internal Medicine

## 2020-04-14 ENCOUNTER — Inpatient Hospital Stay: Payer: Medicare Other | Attending: Internal Medicine

## 2020-04-16 ENCOUNTER — Other Ambulatory Visit
Admission: RE | Admit: 2020-04-16 | Discharge: 2020-04-16 | Disposition: A | Discharge status: Home / Self Care | Payer: Medicare Other | Source: Ambulatory Visit | Attending: Internal Medicine | Admitting: Internal Medicine

## 2020-04-16 ENCOUNTER — Other Ambulatory Visit: Payer: Self-pay

## 2020-04-16 DIAGNOSIS — Z20822 Contact with and (suspected) exposure to covid-19: Secondary | ICD-10-CM | POA: Insufficient documentation

## 2020-04-16 DIAGNOSIS — Z01818 Encounter for other preprocedural examination: Secondary | ICD-10-CM | POA: Diagnosis not present

## 2020-04-16 LAB — SARS CORONAVIRUS 2 (TAT 6-24 HRS): SARS Coronavirus 2: NEGATIVE

## 2020-04-17 ENCOUNTER — Encounter: Payer: Self-pay | Admitting: Internal Medicine

## 2020-04-20 ENCOUNTER — Ambulatory Visit: Payer: Medicare Other | Admitting: Anesthesiology

## 2020-04-20 ENCOUNTER — Encounter
Admission: RE | Disposition: A | Discharge status: Home / Self Care | Payer: Self-pay | Source: Home / Self Care | Attending: Internal Medicine

## 2020-04-20 ENCOUNTER — Ambulatory Visit
Admission: RE | Admit: 2020-04-20 | Discharge: 2020-04-20 | Disposition: A | Discharge status: Home / Self Care | Payer: Medicare Other | Attending: Internal Medicine | Admitting: Internal Medicine

## 2020-04-20 DIAGNOSIS — Z79899 Other long term (current) drug therapy: Secondary | ICD-10-CM | POA: Diagnosis not present

## 2020-04-20 DIAGNOSIS — G4730 Sleep apnea, unspecified: Secondary | ICD-10-CM | POA: Insufficient documentation

## 2020-04-20 DIAGNOSIS — K219 Gastro-esophageal reflux disease without esophagitis: Secondary | ICD-10-CM | POA: Diagnosis not present

## 2020-04-20 DIAGNOSIS — K640 First degree hemorrhoids: Secondary | ICD-10-CM | POA: Insufficient documentation

## 2020-04-20 DIAGNOSIS — J449 Chronic obstructive pulmonary disease, unspecified: Secondary | ICD-10-CM | POA: Insufficient documentation

## 2020-04-20 DIAGNOSIS — L83 Acanthosis nigricans: Secondary | ICD-10-CM | POA: Diagnosis not present

## 2020-04-20 DIAGNOSIS — K621 Rectal polyp: Secondary | ICD-10-CM | POA: Diagnosis not present

## 2020-04-20 DIAGNOSIS — K648 Other hemorrhoids: Secondary | ICD-10-CM | POA: Diagnosis not present

## 2020-04-20 DIAGNOSIS — F419 Anxiety disorder, unspecified: Secondary | ICD-10-CM | POA: Diagnosis not present

## 2020-04-20 DIAGNOSIS — F32A Depression, unspecified: Secondary | ICD-10-CM | POA: Diagnosis not present

## 2020-04-20 DIAGNOSIS — E7800 Pure hypercholesterolemia, unspecified: Secondary | ICD-10-CM | POA: Insufficient documentation

## 2020-04-20 DIAGNOSIS — Z853 Personal history of malignant neoplasm of breast: Secondary | ICD-10-CM | POA: Diagnosis not present

## 2020-04-20 DIAGNOSIS — Z7982 Long term (current) use of aspirin: Secondary | ICD-10-CM | POA: Insufficient documentation

## 2020-04-20 DIAGNOSIS — Z7984 Long term (current) use of oral hypoglycemic drugs: Secondary | ICD-10-CM | POA: Diagnosis not present

## 2020-04-20 DIAGNOSIS — Z79890 Hormone replacement therapy (postmenopausal): Secondary | ICD-10-CM | POA: Diagnosis not present

## 2020-04-20 DIAGNOSIS — Z1211 Encounter for screening for malignant neoplasm of colon: Secondary | ICD-10-CM | POA: Insufficient documentation

## 2020-04-20 DIAGNOSIS — Z883 Allergy status to other anti-infective agents status: Secondary | ICD-10-CM | POA: Diagnosis not present

## 2020-04-20 DIAGNOSIS — K635 Polyp of colon: Secondary | ICD-10-CM | POA: Diagnosis not present

## 2020-04-20 DIAGNOSIS — K2100 Gastro-esophageal reflux disease with esophagitis, without bleeding: Secondary | ICD-10-CM | POA: Diagnosis not present

## 2020-04-20 DIAGNOSIS — I2510 Atherosclerotic heart disease of native coronary artery without angina pectoris: Secondary | ICD-10-CM | POA: Insufficient documentation

## 2020-04-20 DIAGNOSIS — K2289 Other specified disease of esophagus: Secondary | ICD-10-CM | POA: Diagnosis not present

## 2020-04-20 DIAGNOSIS — K2270 Barrett's esophagus without dysplasia: Secondary | ICD-10-CM | POA: Diagnosis not present

## 2020-04-20 DIAGNOSIS — D123 Benign neoplasm of transverse colon: Secondary | ICD-10-CM | POA: Diagnosis not present

## 2020-04-20 DIAGNOSIS — I10 Essential (primary) hypertension: Secondary | ICD-10-CM | POA: Insufficient documentation

## 2020-04-20 DIAGNOSIS — Z86010 Personal history of colonic polyps: Secondary | ICD-10-CM | POA: Diagnosis not present

## 2020-04-20 DIAGNOSIS — E1151 Type 2 diabetes mellitus with diabetic peripheral angiopathy without gangrene: Secondary | ICD-10-CM | POA: Diagnosis not present

## 2020-04-20 DIAGNOSIS — Z85828 Personal history of other malignant neoplasm of skin: Secondary | ICD-10-CM | POA: Insufficient documentation

## 2020-04-20 DIAGNOSIS — D126 Benign neoplasm of colon, unspecified: Secondary | ICD-10-CM | POA: Diagnosis not present

## 2020-04-20 DIAGNOSIS — E785 Hyperlipidemia, unspecified: Secondary | ICD-10-CM | POA: Diagnosis not present

## 2020-04-20 DIAGNOSIS — G4733 Obstructive sleep apnea (adult) (pediatric): Secondary | ICD-10-CM | POA: Diagnosis not present

## 2020-04-20 DIAGNOSIS — E039 Hypothyroidism, unspecified: Secondary | ICD-10-CM | POA: Insufficient documentation

## 2020-04-20 HISTORY — DX: Anxiety disorder, unspecified: F41.9

## 2020-04-20 HISTORY — DX: Unspecified asthma, uncomplicated: J45.909

## 2020-04-20 HISTORY — PX: ESOPHAGOGASTRODUODENOSCOPY: SHX5428

## 2020-04-20 HISTORY — DX: Polyp of colon: K63.5

## 2020-04-20 HISTORY — DX: Unspecified malignant neoplasm of skin, unspecified: C44.90

## 2020-04-20 HISTORY — PX: COLONOSCOPY WITH PROPOFOL: SHX5780

## 2020-04-20 LAB — HM COLONOSCOPY

## 2020-04-20 LAB — GLUCOSE, CAPILLARY: Glucose-Capillary: 109 mg/dL — ABNORMAL HIGH (ref 70–99)

## 2020-04-20 LAB — SURGICAL PATHOLOGY

## 2020-04-20 SURGERY — EGD (ESOPHAGOGASTRODUODENOSCOPY)
Anesthesia: General

## 2020-04-20 MED ORDER — MIDAZOLAM HCL 5 MG/5ML IJ SOLN
INTRAMUSCULAR | Status: DC | PRN
  Administered 2020-04-20: 2 mg via INTRAVENOUS

## 2020-04-20 MED ORDER — LIDOCAINE HCL (PF) 2 % IJ SOLN
INTRAMUSCULAR | Status: AC
  Filled 2020-04-20: qty 5

## 2020-04-20 MED ORDER — MIDAZOLAM HCL 2 MG/2ML IJ SOLN
INTRAMUSCULAR | Status: AC
  Filled 2020-04-20: qty 2

## 2020-04-20 MED ORDER — LIDOCAINE HCL (PF) 2 % IJ SOLN
INTRAMUSCULAR | Status: DC | PRN
  Administered 2020-04-20: 60 mg

## 2020-04-20 MED ORDER — FENTANYL CITRATE (PF) 100 MCG/2ML IJ SOLN
INTRAMUSCULAR | Status: DC | PRN
  Administered 2020-04-20 (×2): 50 ug via INTRAVENOUS

## 2020-04-20 MED ORDER — PROPOFOL 500 MG/50ML IV EMUL
INTRAVENOUS | Status: DC | PRN
  Administered 2020-04-20: 50 ug/kg/min via INTRAVENOUS

## 2020-04-20 MED ORDER — PROPOFOL 10 MG/ML IV BOLUS
INTRAVENOUS | Status: DC | PRN
  Administered 2020-04-20 (×2): 10 mg via INTRAVENOUS
  Administered 2020-04-20: 20 mg via INTRAVENOUS

## 2020-04-20 MED ORDER — SODIUM CHLORIDE 0.9 % IV SOLN
INTRAVENOUS | Status: DC
  Administered 2020-04-20: 20 mL/h via INTRAVENOUS

## 2020-04-20 MED ORDER — FENTANYL CITRATE (PF) 100 MCG/2ML IJ SOLN
INTRAMUSCULAR | Status: AC
Start: 2020-04-20 — End: ?
  Filled 2020-04-20: qty 2

## 2020-04-20 NOTE — H&P (Signed)
Outpatient short stay form Pre-procedure 04/20/2020 9:51 AM Anaiyah Anglemyer K. [name], M.D.  Primary Physician: Deborra Medina, M.D.  Reason for visit:  Personal history of colon polyps (Tubular adenoma x 3 - 09/05/2011).  History of present illness:                            Patient presents for colonoscopy for a personal hx of colon polyps. The patient denies abdominal pain, abnormal weight loss or rectal bleeding.      Current Facility-Administered Medications:  .  0.9 %  sodium chloride infusion, , Intravenous, Continuous, Hewitt, Benay Pike, MD, Last Rate: 20 mL/hr at 04/20/20 0914, 20 mL/hr at 04/20/20 0914  Medications Prior to Admission  Medication Sig Dispense Refill Last Dose  . Accu-Chek Softclix Lancets lancets Use to check sugar once daily. Dx: E11.9 100 each 3 04/19/2020 at Unknown time  . aspirin EC 81 MG tablet Take 1 tablet (81 mg total) by mouth daily. 90 tablet 1 Past Week at Unknown time  . atorvastatin (LIPITOR) 40 MG tablet TAKE 1 TABLET BY MOUTH EVERY DAY 90 tablet 1 Past Week at Unknown time  . blood glucose meter kit and supplies Dispense based on patient and insurance preference. Use to check sugar once daily as directed. (FOR ICD-10 E10.9, E11.9). 1 each 0 04/19/2020 at Unknown time  . glucose blood (ACCU-CHEK AVIVA PLUS) test strip Use to check sugar once daily. Dx. E11.9 100 each 3 04/19/2020 at Unknown time  . hydrochlorothiazide (HYDRODIURIL) 25 MG tablet TAKE 1 TABLET BY MOUTH EVERY DAY 90 tablet 3 04/19/2020 at Unknown time  . levothyroxine (SYNTHROID) 88 MCG tablet TAKE 1 TABLET (88 MCG TOTAL) BY MOUTH DAILY BEFORE BREAKFAST. 90 tablet 0 04/19/2020 at Unknown time  . losartan (COZAAR) 100 MG tablet TAKE 1 TABLET BY MOUTH EVERYDAY AT BEDTIME 90 tablet 3 04/19/2020 at Unknown time  . metFORMIN (GLUCOPHAGE-XR) 500 MG 24 hr tablet TAKE 1 TABLET BY MOUTH EVERY DAY WITH BREAKFAST 90 tablet 1 04/19/2020 at Unknown time  . Multiple Vitamin (MULTIVITAMIN) tablet Take 1 tablet  by mouth daily.   04/19/2020 at Unknown time  . nitroGLYCERIN (NITROSTAT) 0.4 MG SL tablet Place under the tongue.   Past Month at Unknown time  . omeprazole (PRILOSEC) 40 MG capsule TAKE 1 CAPSULE BY MOUTH EVERY DAY 90 capsule 1 04/19/2020 at Unknown time  . rOPINIRole (REQUIP) 0.25 MG tablet TAKE 1 TABLET (0.25 MG TOTAL) BY MOUTH AT BEDTIME. MAY INCREASE WEEKLY AS NEEDED FOR RLS   04/19/2020 at Unknown time  . traZODone (DESYREL) 100 MG tablet Take 100 mg by mouth at bedtime.   04/19/2020 at Unknown time  . venlafaxine XR (EFFEXOR-XR) 150 MG 24 hr capsule TAKE 1 CAPSULE BY MOUTH EVERY DAY 90 capsule 1 04/19/2020 at Unknown time     Allergies  Allergen Reactions  . Lamisil [Terbinafine] Rash  . No Known Allergies Rash     Past Medical History:  Diagnosis Date  . Anxiety   . Asthma   . Barrett's esophagus   . Breast cancer ([place]) 7/10   left , invasive lobular carcinoma  . Colon polyps   . Complication of anesthesia    difficulty waking up and sleep apnea, low sats  . COPD (chronic obstructive pulmonary disease) ([place])   . Coronary artery disease   . Depression   . GERD (gastroesophageal reflux disease)   . Hypercholesteremia   . Hypertension   . Hypothyroidism   .  Personal history of tobacco use, presenting hazards to health 05/06/2015  . Personal history of tobacco use, presenting hazards to health 05/06/2015  . Pneumonia    hx of  . PVC (premature ventricular contraction)   . Skin cancer   . Sleep apnea    wears CPAP nightly  . Trigeminal neuralgia   . Trigeminal neuralgia     Review of systems:  Otherwise negative.    Physical Exam  Gen: Alert, oriented. Appears stated age.  HEENT: [place]/AT. PERRLA. Lungs: CTA, no wheezes. CV: RR nl S1, S2. Abd: soft, benign, no masses. BS+ Ext: No edema. Pulses 2+    Planned procedures: Proceed with colonoscopy. The patient understands the nature of the planned procedure, indications, risks, alternatives and potential  complications including but not limited to bleeding, infection, perforation, damage to internal organs and possible oversedation/side effects from anesthesia. The patient agrees and gives consent to proceed.  Please refer to procedure notes for findings, recommendations and patient disposition/instructions.     Katherina Wimer K. [name], M.D. Gastroenterology 04/20/2020  9:51 AM

## 2020-04-20 NOTE — Anesthesia Postprocedure Evaluation (Signed)
Anesthesia Post Note  Patient: Crystal Meyers  Procedure(s) Performed: ESOPHAGOGASTRODUODENOSCOPY (EGD) (N/A ) COLONOSCOPY WITH PROPOFOL (N/A )  Patient location during evaluation: PACU Anesthesia Type: General Level of consciousness: awake and alert Pain management: pain level controlled Vital Signs Assessment: post-procedure vital signs reviewed and stable Respiratory status: spontaneous breathing, nonlabored ventilation, respiratory function stable and patient connected to nasal cannula oxygen Cardiovascular status: blood pressure returned to baseline and stable Postop Assessment: no apparent nausea or vomiting Anesthetic complications: no   No complications documented.   Last Vitals:  Vitals:   04/20/20 1053 04/20/20 1103  BP: 136/67 (!) 141/65  Pulse: 66 73  Resp: 16 19  Temp:    SpO2: 96% 92%    Last Pain:  Vitals:   04/20/20 1103  TempSrc:   PainSc: 0-No pain                 Molli Barrows

## 2020-04-20 NOTE — Interval H&P Note (Signed)
History and Physical Interval Note:  04/20/2020 9:53 AM  Crystal Meyers  has presented today for surgery, with the diagnosis of BARRETT'S ESOPHAGUS,PERSONAL HX.OF COLON POLYPS.  The various methods of treatment have been discussed with the patient and family. After consideration of risks, benefits and other options for treatment, the patient has consented to  Procedure(s): ESOPHAGOGASTRODUODENOSCOPY (EGD) (N/A) COLONOSCOPY WITH PROPOFOL (N/A) as a surgical intervention.  The patient's history has been reviewed, patient examined, no change in status, stable for surgery.  I have reviewed the patient's chart and labs.  Questions were answered to the patient's satisfaction.     [place], [place]

## 2020-04-20 NOTE — Op Note (Addendum)
[facility]enter[facility] Patient Name: Crystal Meyers Procedure Date: 04/20/2020 10:00 AM MRN: [id number] Account #: [id number] Date of Birth: [birth year] Admit Type: Outpatient Age: 74 Room: [facility] ENDO ROOM 4 Gender: Female Note Status: Finalized Procedure:             Upper GI endoscopy Indications:           Surveillance for malignancy due to personal history of                         Barrett's esophagus Providers:             Benay Pike. [name] MD, MD Referring MD:          Deborra Medina, MD (Referring MD) Medicines:             Propofol per Anesthesia Complications:         No immediate complications. Procedure:             Pre-Anesthesia Assessment:                        - The risks and benefits of the procedure and the                         sedation options and risks were discussed with the                         patient. All questions were answered and informed                         consent was obtained.                        - Patient identification and proposed procedure were                         verified prior to the procedure by the nurse. The                         procedure was verified in the procedure room.                        - ASA Grade Assessment: III - A patient with severe                         systemic disease.                        - After reviewing the risks and benefits, the patient                         was deemed in satisfactory condition to undergo the                         procedure.                        After obtaining informed consent, the endoscope was                         passed under direct vision. Throughout  the procedure,                         the patient's blood pressure, pulse, and oxygen                         saturations were monitored continuously. The Endoscope                         was introduced through the mouth, and advanced to the                         third part of duodenum. The  upper GI endoscopy was                         accomplished without difficulty. The patient tolerated                         the procedure well. Findings:      There were esophageal mucosal changes secondary to established       short-segment Barrett's disease present at the gastroesophageal       junction. The maximum longitudinal extent of these mucosal changes was 1       cm in length. Mucosa was biopsied with a cold forceps for histology in 4       quadrants at the gastroesophageal junction. One specimen bottle was sent       to pathology.      Diffuse glycogenic acanthosis was found in the entire esophagus.      The stomach was normal.      The examined duodenum was normal.      The exam was otherwise without abnormality. Impression:            - Esophageal mucosal changes secondary to established                         short-segment Barrett's disease. Biopsied.                        - Glycogenic acanthosis of the esophagus.                        - Normal stomach.                        - Normal examined duodenum.                        - The examination was otherwise normal. Recommendation:        - Continue present medications.                        - Await pathology results.                        - Proceed with colonoscopy Procedure Code(s):     --- Professional ---                        [id number], Esophagogastroduodenoscopy, flexible,  transoral; with biopsy, single or multiple Diagnosis Code(s):     --- Professional ---                        K22.8, Other specified diseases of esophagus                        K22.70, Barrett's esophagus without dysplasia CPT copyright 2019 American Medical Association. All rights reserved. The codes documented in this report are preliminary and upon coder review may  be revised to meet current compliance requirements. Efrain Sella MD, MD 04/20/2020 10:13:56 AM This report has been signed  electronically. Number of Addenda: 0 Note Initiated On: 04/20/2020 10:00 AM Estimated Blood Loss:  Estimated blood loss: none.      [facility]

## 2020-04-20 NOTE — Anesthesia Preprocedure Evaluation (Signed)
Anesthesia Evaluation  Patient identified by MRN, date of birth, ID band Patient awake    Reviewed: Allergy & Precautions, H&P , NPO status , Patient's Chart, lab work & pertinent test results, reviewed documented beta blocker date and time   History of Anesthesia Complications (+) history of anesthetic complications  Airway Mallampati: II   Neck ROM: full    Dental  (+) Poor Dentition   Pulmonary asthma , sleep apnea and Continuous Positive Airway Pressure Ventilation , pneumonia, resolved, COPD,  COPD inhaler, Current Smoker and Patient abstained from smoking.,    Pulmonary exam normal        Cardiovascular Exercise Tolerance: Poor hypertension, On Medications + CAD and + Peripheral Vascular Disease  Normal cardiovascular exam Rhythm:regular Rate:Normal     Neuro/Psych Anxiety Depression  Neuromuscular disease negative psych ROS   GI/Hepatic Neg liver ROS, GERD  Medicated,  Endo/Other  diabetes, Well Controlled, Type 2, Oral Hypoglycemic AgentsHypothyroidism   Renal/GU negative Renal ROS  negative genitourinary   Musculoskeletal   Abdominal   Peds  Hematology negative hematology ROS (+)   Anesthesia Other Findings Past Medical History: No date: Anxiety No date: Asthma No date: Barrett's esophagus 7/10: Breast cancer (HCC)     Comment:  left , invasive lobular carcinoma No date: Colon polyps No date: Complication of anesthesia     Comment:  difficulty waking up and sleep apnea, low sats No date: COPD (chronic obstructive pulmonary disease) (HCC) No date: Coronary artery disease No date: Depression No date: GERD (gastroesophageal reflux disease) No date: Hypercholesteremia No date: Hypertension No date: Hypothyroidism 05/06/2015: Personal history of tobacco use, presenting hazards to  health 05/06/2015: Personal history of tobacco use, presenting hazards to  health No date: Pneumonia     Comment:  hx  of No date: PVC (premature ventricular contraction) No date: Skin cancer No date: Sleep apnea     Comment:  wears CPAP nightly No date: Trigeminal neuralgia No date: Trigeminal neuralgia Past Surgical History: 2011: BREAST SURGERY; Bilateral     Comment:  dbl mastectomy No date: COLONOSCOPY W/ POLYPECTOMY 01/21/2016: CRANIECTOMY; Right     Comment:  Procedure: Microvascular Decompression - right               trigemminal nerve;  Surgeon: [name], MD;                Location: [facility];  Service: Neurosurgery;                Laterality: Right; 02/03/2016: CRANIOTOMY; Right     Comment:  Procedure: REPAIR OF CEREBROSPINAL FLUID LEAK AND               HARVEST ABDOMINAL FAT GRAFT;  Surgeon: [name],              MD;  Location: [facility];  Service: Neurosurgery;                Laterality: Right; 08/11/2015: LUMBAR LAMINECTOMY/DECOMPRESSION MICRODISCECTOMY; Left     Comment:  Procedure: Left Lumbar four-five microdiscectomy;                Surgeon: [name], MD;  Location: [facility];               Service: Neurosurgery;  Laterality: Left; 05/2009: MASTECTOMY     Comment:  bilateral, reconstructive surgery 09/2009 No date: MASTECTOMY     Comment:  Bilateral  No date: repair ectropion extensive; Bilateral No date: skin  cancer removal BMI    Body Mass Index: 26.31 kg/m     Reproductive/Obstetrics negative OB ROS                             Anesthesia Physical Anesthesia Plan  ASA: III  Anesthesia Plan: General   Post-op Pain Management:    Induction:   PONV Risk Score and Plan:   Airway Management Planned:   Additional Equipment:   Intra-op Plan:   Post-operative Plan:   Informed Consent: I have reviewed the patients History and Physical, chart, labs and discussed the procedure including the risks, benefits and alternatives for the proposed anesthesia with the patient or authorized representative who has  indicated his/her understanding and acceptance.     Dental Advisory Given  Plan Discussed with: CRNA  Anesthesia Plan Comments:         Anesthesia Quick Evaluation

## 2020-04-20 NOTE — Op Note (Addendum)
[facility] Gastroenterology Patient Name: Crystal Meyers Procedure Date: 04/20/2020 9:59 AM MRN: [id number] Account #: [id number] Date of Birth: Jun 15, 1946 Admit Type: Outpatient Age: 74 Room: [facility] ENDO ROOM 4 Gender: Female Note Status: Finalized Procedure:             Colonoscopy Indications:           Surveillance: Personal history of adenomatous polyps                         on last colonoscopy > 5 years ago Providers:             Lorie Apley K. [name] MD, MD Referring MD:          Deborra Medina, MD (Referring MD) Medicines:             Propofol per Anesthesia Complications:         No immediate complications. Procedure:             Pre-Anesthesia Assessment:                        - The risks and benefits of the procedure and the                         sedation options and risks were discussed with the                         patient. All questions were answered and informed                         consent was obtained.                        - Patient identification and proposed procedure were                         verified prior to the procedure by the nurse. The                         procedure was verified in the procedure room.                        - ASA Grade Assessment: III - A patient with severe                         systemic disease.                        - After reviewing the risks and benefits, the patient                         was deemed in satisfactory condition to undergo the                         procedure.                        After obtaining informed consent, the colonoscope was                         passed under direct vision.  Throughout the procedure,                         the patient's blood pressure, pulse, and oxygen                         saturations were monitored continuously. The                         Colonoscope was introduced through the anus and                         advanced to the the cecum, identified by  appendiceal                         orifice and ileocecal valve. The colonoscopy was                         performed without difficulty. The patient tolerated                         the procedure well. The quality of the bowel                         preparation was adequate. The ileocecal valve,                         appendiceal orifice, and rectum were photographed. Findings:      The perianal and digital rectal examinations were normal. Pertinent       negatives include normal sphincter tone and no palpable rectal lesions.      A 7 mm polyp was found in the hepatic flexure. The polyp was sessile.       The polyp was removed with a cold snare. Resection and retrieval were       complete.      Two sessile polyps were found in the transverse colon. The polyps were 3       to 5 mm in size. These polyps were removed with a jumbo cold forceps.       Resection and retrieval were complete.      Two sessile polyps were found in the rectum. The polyps were 3 to 4 mm       in size. These polyps were removed with a jumbo cold forceps. Resection       and retrieval were complete.      Non-bleeding internal hemorrhoids were found during retroflexion. The       hemorrhoids were Grade I (internal hemorrhoids that do not prolapse).      The exam was otherwise without abnormality. Impression:            - One 7 mm polyp at the hepatic flexure, removed with                         a cold snare. Resected and retrieved.                        - Two 3 to 5 mm polyps in the transverse colon,  removed with a jumbo cold forceps. Resected and                         retrieved.                        - Two 3 to 4 mm polyps in the rectum, removed with a                         jumbo cold forceps. Resected and retrieved.                        - Non-bleeding internal hemorrhoids.                        - The examination was otherwise normal. Recommendation:        - Patient has a  contact number available for                         emergencies. The signs and symptoms of potential                         delayed complications were discussed with the patient.                         Return to normal activities tomorrow. Written                         discharge instructions were provided to the patient.                        - Resume previous diet.                        - Continue present medications.                        - Repeat colonoscopy is recommended for surveillance.                         The colonoscopy date will be determined after                         pathology results from today's exam become available                         for review.                        - Return to GI office PRN.                        - The findings and recommendations were discussed with                         the patient. Procedure Code(s):     --- Professional ---                        [phone], Colonoscopy, flexible; with removal of  tumor(s), polyp(s), or other lesion(s) by snare                         technique                        45380, 59, Colonoscopy, flexible; with biopsy, single                         or multiple Diagnosis Code(s):     --- Professional ---                        K64.0, First degree hemorrhoids                        K62.1, Rectal polyp                        K63.5, Polyp of colon                        Z86.010, Personal history of colonic polyps CPT copyright 2019 [employer]. All rights reserved. The codes documented in this report are preliminary and upon coder review may  be revised to meet current compliance requirements. Efrain Sella MD, MD 04/20/2020 10:33:33 AM This report has been signed electronically. Number of Addenda: 0 Note Initiated On: 04/20/2020 9:59 AM Scope Withdrawal Time: 0 hours 7 minutes 56 seconds  Total Procedure Duration: 0 hours 13 minutes 49 seconds  Estimated  Blood Loss:  Estimated blood loss: none.      [facility]

## 2020-04-20 NOTE — Transfer of Care (Signed)
Immediate Anesthesia Transfer of Care Note  Patient: Crystal Meyers  Procedure(s) Performed: ESOPHAGOGASTRODUODENOSCOPY (EGD) (N/A ) COLONOSCOPY WITH PROPOFOL (N/A )  Patient Location: PACU  Anesthesia Type:General  Level of Consciousness: sedated  Airway & Oxygen Therapy: Patient Spontanous Breathing  Post-op Assessment: Report given to RN and Post -op Vital signs reviewed and stable  Post vital signs: Reviewed and stable  Last Vitals:  Vitals Value Taken Time  BP 127/65 04/20/20 1033  Temp    Pulse 70 04/20/20 1033  Resp 14 04/20/20 1033  SpO2 95 % 04/20/20 1033  Vitals shown include unvalidated device data.  Last Pain:  Vitals:   04/20/20 1033  TempSrc:   PainSc: 0-No pain         Complications: No complications documented.

## 2020-04-20 NOTE — Interval H&P Note (Signed)
History and Physical Interval Note:  04/20/2020 10:41 AM  Crystal Meyers  has presented today for surgery, with the diagnosis of BARRETT'S ESOPHAGUS,PERSONAL HX.OF COLON POLYPS.  The various methods of treatment have been discussed with the patient and family. After consideration of risks, benefits and other options for treatment, the patient has consented to  Procedure(s): ESOPHAGOGASTRODUODENOSCOPY (EGD) (N/A) COLONOSCOPY WITH PROPOFOL (N/A) as a surgical intervention.  The patient's history has been reviewed, patient examined, no change in status, stable for surgery.  I have reviewed the patient's chart and labs.  Questions were answered to the patient's satisfaction.     [place], [place]

## 2020-04-20 NOTE — H&P (Signed)
Outpatient short stay form Pre-procedure 04/20/2020 10:40 AM Crystal Meyers K. [name], M.D.  Primary Physician: Deborra Medina, M.D.  Reason for visit: History of Barrett's esophagus  History of present illness: 74y/o patient has a personal history of Barrett's esophagus without dysplasia. Patient denies hemetemesis, nausea, vomiting, dysphagia, weight loss. Takes PPI without significant side effects.     Current Facility-Administered Medications:  .  0.9 %  sodium chloride infusion, , Intravenous, Continuous, Hoyt, Benay Pike, MD, Last Rate: 20 mL/hr at 04/20/20 7290, Continued from Pre-op at 04/20/20 0958  Medications Prior to Admission  Medication Sig Dispense Refill Last Dose  . Accu-Chek Softclix Lancets lancets Use to check sugar once daily. Dx: E11.9 100 each 3 04/19/2020 at Unknown time  . aspirin EC 81 MG tablet Take 1 tablet (81 mg total) by mouth daily. 90 tablet 1 Past Week at Unknown time  . atorvastatin (LIPITOR) 40 MG tablet TAKE 1 TABLET BY MOUTH EVERY DAY 90 tablet 1 Past Week at Unknown time  . blood glucose meter kit and supplies Dispense based on patient and insurance preference. Use to check sugar once daily as directed. (FOR ICD-10 E10.9, E11.9). 1 each 0 04/19/2020 at Unknown time  . glucose blood (ACCU-CHEK AVIVA PLUS) test strip Use to check sugar once daily. Dx. E11.9 100 each 3 04/19/2020 at Unknown time  . hydrochlorothiazide (HYDRODIURIL) 25 MG tablet TAKE 1 TABLET BY MOUTH EVERY DAY 90 tablet 3 04/19/2020 at Unknown time  . levothyroxine (SYNTHROID) 88 MCG tablet TAKE 1 TABLET (88 MCG TOTAL) BY MOUTH DAILY BEFORE BREAKFAST. 90 tablet 0 04/19/2020 at Unknown time  . losartan (COZAAR) 100 MG tablet TAKE 1 TABLET BY MOUTH EVERYDAY AT BEDTIME 90 tablet 3 04/19/2020 at Unknown time  . metFORMIN (GLUCOPHAGE-XR) 500 MG 24 hr tablet TAKE 1 TABLET BY MOUTH EVERY DAY WITH BREAKFAST 90 tablet 1 04/19/2020 at Unknown time  . Multiple Vitamin (MULTIVITAMIN) tablet Take 1 tablet by  mouth daily.   04/19/2020 at Unknown time  . nitroGLYCERIN (NITROSTAT) 0.4 MG SL tablet Place under the tongue.   Past Month at Unknown time  . omeprazole (PRILOSEC) 40 MG capsule TAKE 1 CAPSULE BY MOUTH EVERY DAY 90 capsule 1 04/19/2020 at Unknown time  . rOPINIRole (REQUIP) 0.25 MG tablet TAKE 1 TABLET (0.25 MG TOTAL) BY MOUTH AT BEDTIME. MAY INCREASE WEEKLY AS NEEDED FOR RLS   04/19/2020 at Unknown time  . traZODone (DESYREL) 100 MG tablet Take 100 mg by mouth at bedtime.   04/19/2020 at Unknown time  . venlafaxine XR (EFFEXOR-XR) 150 MG 24 hr capsule TAKE 1 CAPSULE BY MOUTH EVERY DAY 90 capsule 1 04/19/2020 at Unknown time     Allergies  Allergen Reactions  . Lamisil [Terbinafine] Rash  . No Known Allergies Rash     Past Medical History:  Diagnosis Date  . Anxiety   . Asthma   . Barrett's esophagus   . Breast cancer ([place]) 7/10   left , invasive lobular carcinoma  . Colon polyps   . Complication of anesthesia    difficulty waking up and sleep apnea, low sats  . COPD (chronic obstructive pulmonary disease) ([place])   . Coronary artery disease   . Depression   . GERD (gastroesophageal reflux disease)   . Hypercholesteremia   . Hypertension   . Hypothyroidism   . Personal history of tobacco use, presenting hazards to health 05/06/2015  . Personal history of tobacco use, presenting hazards to health 05/06/2015  . Pneumonia    hx  of  . PVC (premature ventricular contraction)   . Skin cancer   . Sleep apnea    wears CPAP nightly  . Trigeminal neuralgia   . Trigeminal neuralgia     Review of systems:  Otherwise negative.    Physical Exam  Gen: Alert, oriented. Appears stated age.  HEENT: [place]/AT. PERRLA. Lungs: CTA, no wheezes. CV: RR nl S1, S2. Abd: soft, benign, no masses. BS+ Ext: No edema. Pulses 2+    Planned procedures: Proceed with EGD. The patient understands the nature of the planned procedure, indications, risks, alternatives and potential complications  including but not limited to bleeding, infection, perforation, damage to internal organs and possible oversedation/side effects from anesthesia. The patient agrees and gives consent to proceed.  Please refer to procedure notes for findings, recommendations and patient disposition/instructions.     [name] K. [name], M.D. Gastroenterology 04/20/2020  10:40 AM

## 2020-04-21 ENCOUNTER — Other Ambulatory Visit: Payer: Self-pay

## 2020-04-21 ENCOUNTER — Encounter: Payer: Self-pay | Admitting: Internal Medicine

## 2020-04-21 DIAGNOSIS — D126 Benign neoplasm of colon, unspecified: Secondary | ICD-10-CM | POA: Insufficient documentation

## 2020-04-22 MED ORDER — LEVOTHYROXINE SODIUM 88 MCG PO TABS: 88.0000 ug | ORAL_TABLET | Freq: Every day | ORAL | 0 refills | Status: DC

## 2020-05-08 DIAGNOSIS — H524 Presbyopia: Secondary | ICD-10-CM | POA: Diagnosis not present

## 2020-05-08 DIAGNOSIS — E119 Type 2 diabetes mellitus without complications: Secondary | ICD-10-CM | POA: Diagnosis not present

## 2020-05-08 DIAGNOSIS — I10 Essential (primary) hypertension: Secondary | ICD-10-CM | POA: Diagnosis not present

## 2020-05-08 DIAGNOSIS — Z961 Presence of intraocular lens: Secondary | ICD-10-CM | POA: Diagnosis not present

## 2020-05-08 DIAGNOSIS — E7800 Pure hypercholesterolemia, unspecified: Secondary | ICD-10-CM | POA: Diagnosis not present

## 2020-05-08 DIAGNOSIS — H35033 Hypertensive retinopathy, bilateral: Secondary | ICD-10-CM | POA: Diagnosis not present

## 2020-05-08 LAB — HM DIABETES EYE EXAM

## 2020-06-02 ENCOUNTER — Other Ambulatory Visit: Payer: Self-pay

## 2020-06-02 DIAGNOSIS — Z170 Estrogen receptor positive status [ER+]: Secondary | ICD-10-CM

## 2020-06-02 DIAGNOSIS — C50412 Malignant neoplasm of upper-outer quadrant of left female breast: Secondary | ICD-10-CM

## 2020-06-02 DIAGNOSIS — Z122 Encounter for screening for malignant neoplasm of respiratory organs: Secondary | ICD-10-CM

## 2020-06-02 DIAGNOSIS — I872 Venous insufficiency (chronic) (peripheral): Secondary | ICD-10-CM

## 2020-06-03 ENCOUNTER — Other Ambulatory Visit: Payer: Self-pay | Admitting: *Deleted

## 2020-06-03 DIAGNOSIS — Z87891 Personal history of nicotine dependence: Secondary | ICD-10-CM

## 2020-06-03 DIAGNOSIS — Z122 Encounter for screening for malignant neoplasm of respiratory organs: Secondary | ICD-10-CM

## 2020-06-03 NOTE — Progress Notes (Signed)
Contacted and scheduled for lung screening scan. Patient is a current smoker with a 76 pack year history.

## 2020-06-08 ENCOUNTER — Ambulatory Visit
Admission: RE | Admit: 2020-06-08 | Discharge: 2020-06-08 | Disposition: A | Discharge status: Home / Self Care | Payer: Medicare Other | Source: Ambulatory Visit | Attending: Internal Medicine | Admitting: Internal Medicine

## 2020-06-08 ENCOUNTER — Inpatient Hospital Stay: Payer: Medicare Other | Attending: Internal Medicine

## 2020-06-08 ENCOUNTER — Encounter: Payer: Self-pay | Admitting: Internal Medicine

## 2020-06-08 ENCOUNTER — Other Ambulatory Visit: Payer: Self-pay

## 2020-06-08 ENCOUNTER — Inpatient Hospital Stay (HOSPITAL_BASED_OUTPATIENT_CLINIC_OR_DEPARTMENT_OTHER): Payer: Medicare Other | Admitting: Internal Medicine

## 2020-06-08 VITALS — BP 121/76 | HR 86 | Temp 97.8°F | Resp 16 | Wt 169.4 lb

## 2020-06-08 DIAGNOSIS — I2510 Atherosclerotic heart disease of native coronary artery without angina pectoris: Secondary | ICD-10-CM

## 2020-06-08 DIAGNOSIS — M5459 Other low back pain: Secondary | ICD-10-CM | POA: Diagnosis not present

## 2020-06-08 DIAGNOSIS — Z170 Estrogen receptor positive status [ER+]: Secondary | ICD-10-CM

## 2020-06-08 DIAGNOSIS — C50412 Malignant neoplasm of upper-outer quadrant of left female breast: Secondary | ICD-10-CM | POA: Insufficient documentation

## 2020-06-08 DIAGNOSIS — F17210 Nicotine dependence, cigarettes, uncomplicated: Secondary | ICD-10-CM | POA: Diagnosis not present

## 2020-06-08 DIAGNOSIS — Z853 Personal history of malignant neoplasm of breast: Secondary | ICD-10-CM | POA: Insufficient documentation

## 2020-06-08 DIAGNOSIS — I2584 Coronary atherosclerosis due to calcified coronary lesion: Secondary | ICD-10-CM | POA: Diagnosis not present

## 2020-06-08 DIAGNOSIS — Z9013 Acquired absence of bilateral breasts and nipples: Secondary | ICD-10-CM | POA: Insufficient documentation

## 2020-06-08 DIAGNOSIS — M545 Low back pain, unspecified: Secondary | ICD-10-CM

## 2020-06-08 DIAGNOSIS — M79604 Pain in right leg: Secondary | ICD-10-CM | POA: Insufficient documentation

## 2020-06-08 DIAGNOSIS — Z13820 Encounter for screening for osteoporosis: Secondary | ICD-10-CM | POA: Diagnosis not present

## 2020-06-08 DIAGNOSIS — I872 Venous insufficiency (chronic) (peripheral): Secondary | ICD-10-CM

## 2020-06-08 DIAGNOSIS — Z122 Encounter for screening for malignant neoplasm of respiratory organs: Secondary | ICD-10-CM

## 2020-06-08 LAB — COMPREHENSIVE METABOLIC PANEL
ALT: 29 U/L (ref 0–44)
AST: 28 U/L (ref 15–41)
Albumin: 4.1 g/dL (ref 3.5–5.0)
Alkaline Phosphatase: 40 U/L (ref 38–126)
Anion gap: 9 (ref 5–15)
BUN: 23 mg/dL (ref 8–23)
CO2: 31 mmol/L (ref 22–32)
Calcium: 9.7 mg/dL (ref 8.9–10.3)
Chloride: 101 mmol/L (ref 98–111)
Creatinine, Ser: 1.06 mg/dL — ABNORMAL HIGH (ref 0.44–1.00)
GFR, Estimated: 56 mL/min — ABNORMAL LOW (ref >60)
Glucose, Bld: 118 mg/dL — ABNORMAL HIGH (ref 70–99)
Potassium: 5 mmol/L (ref 3.5–5.1)
Sodium: 141 mmol/L (ref 135–145)
Total Bilirubin: 0.4 mg/dL (ref 0.3–1.2)
Total Protein: 7.4 g/dL (ref 6.5–8.1)

## 2020-06-08 LAB — CBC WITH DIFFERENTIAL/PLATELET
Abs Immature Granulocytes: 0.03 10*3/uL (ref 0.00–0.07)
Basophils Absolute: 0.1 10*3/uL (ref 0.0–0.1)
Basophils Relative: 1 %
Eosinophils Absolute: 0.2 10*3/uL (ref 0.0–0.5)
Eosinophils Relative: 2 %
HCT: 41.4 % (ref 36.0–46.0)
Hemoglobin: 13.9 g/dL (ref 12.0–15.0)
Immature Granulocytes: 0 %
Lymphocytes Relative: 35 %
Lymphs Abs: 2.6 10*3/uL (ref 0.7–4.0)
MCH: 30.3 pg (ref 26.0–34.0)
MCHC: 33.6 g/dL (ref 30.0–36.0)
MCV: 90.4 fL (ref 80.0–100.0)
Monocytes Absolute: 0.6 10*3/uL (ref 0.1–1.0)
Monocytes Relative: 7 %
Neutro Abs: 4.1 10*3/uL (ref 1.7–7.7)
Neutrophils Relative %: 55 %
Platelets: 232 10*3/uL (ref 150–400)
RBC: 4.58 MIL/uL (ref 3.87–5.11)
RDW: 12.9 % (ref 11.5–15.5)
WBC: 7.6 10*3/uL (ref 4.0–10.5)
nRBC: 0 % (ref 0.0–0.2)

## 2020-06-08 NOTE — Assessment & Plan Note (Addendum)
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 Finished letrozole March 2017.  [7 years]. Low risk-oncotype.  # Currently on surveillance.  No evidence of recurrence- STABLE.   #Low back pain-likely arthritis.  Check lumbar x-rays   # BMD- 2016- T score- Normal; on calcium plus vitamin D.  Order repeat bone density  # Smoking/LCSP-#2020- NOrmal.  Discussed with the patient regarding the ill effects of smoking- including but not limited to cardiac lung and vascular diseases and malignancies. Counseled against smoking; patient seems currently not interested.   # DISPOSITION: BMD- will call # X rays to today # BMD in 1-2 weeks.  # Follow-up in 12 months- MD; labs- cbc/cmp-Dr. B  Addendum: X-rays of the lower back negative for any acute process.  Informed patient.  Bone density results reviewed with patient-normal.  Recommend continued calcium plus vitamin D.  Patient agreement.

## 2020-06-08 NOTE — Progress Notes (Signed)
[facility] OFFICE PROGRESS NOTE  Patient Care Team: [name], MD as PCP - General (Internal Medicine) [name], MD (Unknown Physician Specialty)  Cancer Staging No matching staging information was found for the patient.   Oncology History Overview Note  1. 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 Low Oncotype DX score. Diagnosed in June 2010. 2. Status post-bilateral mastectomy and reconstructive surgery. 3. Started Femara approx. 2011.[name] 4.Breast cancer index revealed a 5.2% increase of late recurrence but low likelihood of benefit with extended adjuvant therapy (March of 2017) Letrozole was discontinued from March of 2017 ---------------------------------------------  DIAGNOSIS: BREAST CANCER  STAGE:   I      ;GOALS: cure   CURRENT/MOST RECENT THERAPY: surveillaince    Carcinoma of upper-outer quadrant of left breast in female, estrogen receptor positive ([place])     INTERVAL HISTORY:  Crystal Meyers 74 y.o.  female pleasant patient above history of stage I breast cancer is here for follow-up.  Patient is currently not on aromatase inhibitor; finishing 2017.  Patient complains of ongoing low back pain for the last 2 weeks.  Improved with TENS unit.  Denies any nausea vomiting abdominal pain.  No new shortness of breath or cough.  Review of Systems  Constitutional: Negative for chills, diaphoresis, fever, malaise/fatigue and weight loss.  HENT: Negative for nosebleeds and sore throat.   Eyes: Negative for double vision.  Respiratory: Negative for cough, hemoptysis, sputum production, shortness of breath and wheezing.   Cardiovascular: Negative for chest pain, palpitations, orthopnea and leg swelling.  Gastrointestinal: Negative for abdominal pain, blood in stool, constipation, diarrhea, heartburn, melena, nausea and vomiting.  Genitourinary:  Negative for dysuria, frequency and urgency.  Musculoskeletal: Positive for back pain and joint pain.  Skin: Negative.  Negative for itching and rash.  Neurological: Negative for dizziness, tingling, focal weakness, weakness and headaches.  Endo/Heme/Allergies: Does not bruise/bleed easily.  Psychiatric/Behavioral: Negative for depression. The patient is not nervous/anxious and does not have insomnia.      PAST MEDICAL HISTORY :  Past Medical History:  Diagnosis Date  . Anxiety   . Asthma   . Barrett's esophagus   . Breast cancer ([place]) 7/10   left , invasive lobular carcinoma  . Colon polyps   . Complication of anesthesia    difficulty waking up and sleep apnea, low sats  . COPD (chronic obstructive pulmonary disease) ([place])   . Coronary artery disease   . Depression   . GERD (gastroesophageal reflux disease)   . Hypercholesteremia   . Hypertension   . Hypothyroidism   . Personal history of tobacco use, presenting hazards to health 05/06/2015  . Personal history of tobacco use, presenting hazards to health 05/06/2015  . Pneumonia    hx of  . PVC (premature ventricular contraction)   . Skin cancer   . Sleep apnea    wears CPAP nightly  . Trigeminal neuralgia   . Trigeminal neuralgia     PAST SURGICAL HISTORY :   Past Surgical History:  Procedure Laterality Date  . BREAST SURGERY Bilateral 2011   dbl mastectomy  . COLONOSCOPY W/ POLYPECTOMY    . COLONOSCOPY WITH PROPOFOL N/A 04/20/2020   Procedure: COLONOSCOPY WITH PROPOFOL;  Surgeon: Toledo, Benay Pike, MD;  Location: [facility] ENDOSCOPY;  Service: Gastroenterology;  Laterality: N/A;  . CRANIECTOMY Right 01/21/2016   Procedure: Microvascular Decompression - right trigemminal nerve;  Surgeon: [name],  MD;  Location: [facility];  Service: Neurosurgery;  Laterality: Right;  . CRANIOTOMY Right 02/03/2016   Procedure: REPAIR OF CEREBROSPINAL FLUID LEAK AND HARVEST ABDOMINAL FAT GRAFT;  Surgeon: [name], MD;   Location: [place] [facility];  Service: Neurosurgery;  Laterality: Right;  . ESOPHAGOGASTRODUODENOSCOPY N/A 04/20/2020   Procedure: ESOPHAGOGASTRODUODENOSCOPY (EGD);  Surgeon: Toledo, Benay Pike, MD;  Location: [facility] ENDOSCOPY;  Service: Gastroenterology;  Laterality: N/A;  . LUMBAR LAMINECTOMY/DECOMPRESSION MICRODISCECTOMY Left 08/11/2015   Procedure: Left Lumbar four-five microdiscectomy;  Surgeon: [name], MD;  Location: [facility];  Service: Neurosurgery;  Laterality: Left;  [name] MASTECTOMY  05/2009   bilateral, reconstructive surgery 09/2009  . MASTECTOMY     Bilateral   . repair ectropion extensive Bilateral   . skin cancer removal      FAMILY HISTORY :   Family History  Problem Relation Age of Onset  . Mental illness Mother   . Diabetes Mother   . Hypertension Mother   . Hyperlipidemia Mother   . Alzheimer's disease Mother   . Hypertension Father   . Hyperlipidemia Father   . Stroke Father   . Heart attack Father   . Diabetes Father   . Deep vein thrombosis Father   . Colon cancer Father   . Cancer Brother        Lung  . Diabetes Brother   . Diabetes Brother   . Stroke Maternal Grandmother     SOCIAL HISTORY:   Social History   Tobacco Use  . Smoking status: Current Every Day Smoker    Packs/day: 1.00    Years: 54.00    Pack years: 54.00    Types: Cigarettes  . Smokeless tobacco: Never Used  . Tobacco comment: Refused cessation material  Substance Use Topics  . Alcohol use: No  . Drug use: No    ALLERGIES:  is allergic to lamisil [terbinafine] and no known allergies.  MEDICATIONS:  Current Outpatient Medications  Medication Sig Dispense Refill  . Accu-Chek Softclix Lancets lancets Use to check sugar once daily. Dx: E11.9 100 each 3  . aspirin EC 81 MG tablet Take 1 tablet (81 mg total) by mouth daily. 90 tablet 1  . atorvastatin (LIPITOR) 40 MG tablet TAKE 1 TABLET BY MOUTH EVERY DAY 90 tablet 1  . blood glucose meter kit and supplies Dispense based on  patient and insurance preference. Use to check sugar once daily as directed. (FOR ICD-10 E10.9, E11.9). 1 each 0  . glucose blood (ACCU-CHEK AVIVA PLUS) test strip Use to check sugar once daily. Dx. E11.9 100 each 3  . losartan (COZAAR) 100 MG tablet TAKE 1 TABLET BY MOUTH EVERYDAY AT BEDTIME 90 tablet 3  . metFORMIN (GLUCOPHAGE-XR) 500 MG 24 hr tablet TAKE 1 TABLET BY MOUTH EVERY DAY WITH BREAKFAST 90 tablet 1  . Multiple Vitamin (MULTIVITAMIN) tablet Take 1 tablet by mouth daily.    [name] omeprazole (PRILOSEC) 40 MG capsule TAKE 1 CAPSULE BY MOUTH EVERY DAY 90 capsule 1  . rOPINIRole (REQUIP) 0.25 MG tablet TAKE 1 TABLET (0.25 MG TOTAL) BY MOUTH AT BEDTIME. MAY INCREASE WEEKLY AS NEEDED FOR RLS    . traZODone (DESYREL) 100 MG tablet Take 100 mg by mouth at bedtime.    [name] venlafaxine XR (EFFEXOR-XR) 150 MG 24 hr capsule TAKE 1 CAPSULE BY MOUTH EVERY DAY 90 capsule 1  . levothyroxine (SYNTHROID) 88 MCG tablet TAKE 1 TABLET (88 MCG TOTAL) BY MOUTH DAILY BEFORE BREAKFAST. 90 tablet 0  .  nitroGLYCERIN (NITROSTAT) 0.4 MG SL tablet Place under the tongue. (Patient not taking: Reported on 06/08/2020)     No current facility-administered medications for this visit.    PHYSICAL EXAMINATION: ECOG PERFORMANCE STATUS: 0 - Asymptomatic  BP 121/76 (BP Location: Right Arm, Patient Position: Sitting)   Pulse 86   Temp 97.8 F (36.6 C) (Tympanic)   Resp 16   Wt 169 lb 6.4 oz (76.8 kg)   SpO2 96%   BMI 27.34 kg/m   Filed Weights   06/08/20 1024  Weight: 169 lb 6.4 oz (76.8 kg)   Physical Exam HENT:     Head: Normocephalic and atraumatic.     Mouth/Throat:     Pharynx: No oropharyngeal exudate.  Eyes:     Pupils: Pupils are equal, round, and reactive to light.  Cardiovascular:     Rate and Rhythm: Normal rate and regular rhythm.  Pulmonary:     Effort: No respiratory distress.     Breath sounds: No wheezing.  Abdominal:     General: Bowel sounds are normal. There is no distension.      Palpations: Abdomen is soft. There is no mass.     Tenderness: There is no abdominal tenderness. There is no guarding or rebound.  Musculoskeletal:        General: No tenderness. Normal range of motion.     Cervical back: Normal range of motion and neck supple.  Skin:    General: Skin is warm.     Comments: Bilateral mastectomies noted.  Bilateral reconstructed breasts.  No lumps or bumps noted.  Neurological:     Mental Status: She is alert and oriented to person, place, and time.  Psychiatric:        Mood and Affect: Affect normal.       LABORATORY DATA:  I have reviewed the data as listed    Component Value Date/Time   NA 141 06/08/2020 1004   NA 136 (A) 09/23/2014 0000   NA 140 03/26/2014 1448   K 5.0 06/08/2020 1004   K 4.0 03/26/2014 1448   CL 101 06/08/2020 1004   CL 104 03/26/2014 1448   CO2 31 06/08/2020 1004   CO2 30 03/26/2014 1448   GLUCOSE 118 (H) 06/08/2020 1004   GLUCOSE 91 03/26/2014 1448   BUN 23 06/08/2020 1004   BUN 14 09/23/2014 0000   BUN 19 (H) 03/26/2014 1448   CREATININE 1.06 (H) 06/08/2020 1004   CREATININE 1.12 03/26/2014 1448   CALCIUM 9.7 06/08/2020 1004   CALCIUM 9.5 03/26/2014 1448   PROT 7.4 06/08/2020 1004   PROT 6.9 03/26/2014 1448   ALBUMIN 4.1 06/08/2020 1004   ALBUMIN 3.7 03/26/2014 1448   AST 28 06/08/2020 1004   AST 23 03/26/2014 1448   ALT 29 06/08/2020 1004   ALT 35 03/26/2014 1448   ALKPHOS 40 06/08/2020 1004   ALKPHOS 45 (L) 03/26/2014 1448   BILITOT 0.4 06/08/2020 1004   BILITOT 0.3 03/26/2014 1448   GFRNONAA 56 (L) 06/08/2020 1004   GFRNONAA 52 (L) 03/26/2014 1448   GFRAA 58 (L) 04/15/2019 1407   GFRAA 60 (L) 03/26/2014 1448    No results found for: SPEP, UPEP  Lab Results  Component Value Date   WBC 7.6 06/08/2020   NEUTROABS 4.1 06/08/2020   HGB 13.9 06/08/2020   HCT 41.4 06/08/2020   MCV 90.4 06/08/2020   PLT 232 06/08/2020      Chemistry      Component Value Date/Time  NA 141 06/08/2020 1004   NA  136 (A) 09/23/2014 0000   NA 140 03/26/2014 1448   K 5.0 06/08/2020 1004   K 4.0 03/26/2014 1448   CL 101 06/08/2020 1004   CL 104 03/26/2014 1448   CO2 31 06/08/2020 1004   CO2 30 03/26/2014 1448   BUN 23 06/08/2020 1004   BUN 14 09/23/2014 0000   BUN 19 (H) 03/26/2014 1448   CREATININE 1.06 (H) 06/08/2020 1004   CREATININE 1.12 03/26/2014 1448   GLU 113 09/23/2014 0000      Component Value Date/Time   CALCIUM 9.7 06/08/2020 1004   CALCIUM 9.5 03/26/2014 1448   ALKPHOS 40 06/08/2020 1004   ALKPHOS 45 (L) 03/26/2014 1448   AST 28 06/08/2020 1004   AST 23 03/26/2014 1448   ALT 29 06/08/2020 1004   ALT 35 03/26/2014 1448   BILITOT 0.4 06/08/2020 1004   BILITOT 0.3 03/26/2014 1448       RADIOGRAPHIC STUDIES: I have personally reviewed the radiological images as listed and agreed with the findings in the report. No results found.   ASSESSMENT & PLAN:  Carcinoma of upper-outer quadrant of left breast in female, estrogen receptor positive ([place]) # Breast cancer stage I ER/PR positive left side status post mastectomy bilateral. Finished letrozole March 2017.  [7 years]. Low risk-oncotype.  # Currently on surveillance.  No evidence of recurrence- STABLE.   #Low back pain-likely arthritis.  Check lumbar x-rays   # BMD- 2016- T score- Normal; on calcium plus vitamin D.  Order repeat bone density  # Smoking/LCSP-#2020- NOrmal.  Discussed with the patient regarding the ill effects of smoking- including but not limited to cardiac lung and vascular diseases and malignancies. Counseled against smoking; patient seems currently not interested.   # DISPOSITION: BMD- will call # X rays to today # BMD in 1-2 weeks.  # Follow-up in 12 months- MD; labs- cbc/cmp-Dr. B  Addendum: X-rays of the lower back negative for any acute process.  Informed patient.  Bone density results reviewed with patient-normal.  Recommend continued calcium plus vitamin D.  Patient agreement.    Orders Placed  This Encounter  Procedures  . DG Lumbar Spine Complete    Standing Status:   Future    Number of Occurrences:   1    Standing Expiration Date:   06/08/2021    Order Specific Question:   Reason for Exam (SYMPTOM  OR DIAGNOSIS REQUIRED)    Answer:   low back pain    Order Specific Question:   Preferred imaging location?    Answer:   [facility]  . DG Bone Density    Standing Status:   Future    Number of Occurrences:   1    Standing Expiration Date:   06/08/2021    Order Specific Question:   Reason for Exam (SYMPTOM  OR DIAGNOSIS REQUIRED)    Answer:   osteoporosis screening    Order Specific Question:   Preferred imaging location?    Answer:   [facility]  . CBC with Differential    Standing Status:   Future    Standing Expiration Date:   12/06/2021  . Comprehensive metabolic panel    Standing Status:   Future    Standing Expiration Date:   12/06/2021   All questions were answered. The patient knows to call the clinic with any problems, questions or concerns.      Cammie Sickle, MD 07/01/2020 12:13 AM

## 2020-06-09 NOTE — Progress Notes (Signed)
FYI- Dr.Tullo.   ------------------------------  Hi Crystal Meyers- wanted to let you know that your back X-rays show arthritis in back, which is chronic. I do not see any concerns for a broken bones. If this is an ongoing problem- please reach to [name] for further plan as needed.  For my stand point, will await bone density test. Call us if any questions. Thanks Dr.B

## 2020-06-10 ENCOUNTER — Other Ambulatory Visit: Payer: Self-pay | Admitting: Internal Medicine

## 2020-06-12 ENCOUNTER — Telehealth: Payer: Self-pay | Admitting: Internal Medicine

## 2020-06-12 NOTE — Telephone Encounter (Signed)
On 12/01-left voicemail for the patient that her lumbar x-rays no evidence of fractures.  Await bone density.  Follow-up as planned.

## 2020-06-23 ENCOUNTER — Other Ambulatory Visit: Payer: Self-pay

## 2020-06-23 ENCOUNTER — Ambulatory Visit
Admission: RE | Admit: 2020-06-23 | Discharge: 2020-06-23 | Disposition: A | Discharge status: Home / Self Care | Payer: Medicare Other | Source: Ambulatory Visit | Attending: Internal Medicine | Admitting: Internal Medicine

## 2020-06-23 DIAGNOSIS — E119 Type 2 diabetes mellitus without complications: Secondary | ICD-10-CM | POA: Insufficient documentation

## 2020-06-23 DIAGNOSIS — Z13820 Encounter for screening for osteoporosis: Secondary | ICD-10-CM

## 2020-06-23 DIAGNOSIS — E039 Hypothyroidism, unspecified: Secondary | ICD-10-CM | POA: Diagnosis not present

## 2020-06-23 DIAGNOSIS — Z780 Asymptomatic menopausal state: Secondary | ICD-10-CM | POA: Diagnosis not present

## 2020-06-23 DIAGNOSIS — J449 Chronic obstructive pulmonary disease, unspecified: Secondary | ICD-10-CM | POA: Diagnosis not present

## 2020-06-24 ENCOUNTER — Other Ambulatory Visit: Payer: Self-pay

## 2020-06-24 ENCOUNTER — Ambulatory Visit
Admission: RE | Admit: 2020-06-24 | Discharge: 2020-06-24 | Disposition: A | Discharge status: Home / Self Care | Payer: Medicare Other | Source: Ambulatory Visit | Attending: Oncology | Admitting: Oncology

## 2020-06-24 DIAGNOSIS — F17210 Nicotine dependence, cigarettes, uncomplicated: Secondary | ICD-10-CM | POA: Diagnosis not present

## 2020-06-24 DIAGNOSIS — Z87891 Personal history of nicotine dependence: Secondary | ICD-10-CM | POA: Diagnosis not present

## 2020-06-24 DIAGNOSIS — Z122 Encounter for screening for malignant neoplasm of respiratory organs: Secondary | ICD-10-CM | POA: Diagnosis not present

## 2020-06-26 ENCOUNTER — Encounter: Payer: Self-pay | Admitting: *Deleted

## 2020-06-30 ENCOUNTER — Telehealth: Payer: Self-pay | Admitting: Internal Medicine

## 2020-06-30 NOTE — Telephone Encounter (Signed)
On 12/21-I called patient to review the results of the bone density; normal.  Continue calcium plus vitamin D.  Back pain intermittent overall improved.  Follow-up as planned.  GB

## 2020-07-07 ENCOUNTER — Other Ambulatory Visit: Payer: Self-pay | Admitting: Internal Medicine

## 2020-07-08 ENCOUNTER — Ambulatory Visit (INDEPENDENT_AMBULATORY_CARE_PROVIDER_SITE_OTHER): Payer: Medicare Other | Admitting: Internal Medicine

## 2020-07-08 ENCOUNTER — Other Ambulatory Visit: Payer: Self-pay

## 2020-07-08 ENCOUNTER — Encounter: Payer: Self-pay | Admitting: Internal Medicine

## 2020-07-08 VITALS — BP 130/70 | HR 90 | Temp 98.3°F | Resp 15 | Ht 66.0 in | Wt 170.8 lb

## 2020-07-08 DIAGNOSIS — Z800 Family history of malignant neoplasm of digestive organs: Secondary | ICD-10-CM | POA: Diagnosis not present

## 2020-07-08 DIAGNOSIS — I10 Essential (primary) hypertension: Secondary | ICD-10-CM | POA: Diagnosis not present

## 2020-07-08 DIAGNOSIS — E039 Hypothyroidism, unspecified: Secondary | ICD-10-CM

## 2020-07-08 DIAGNOSIS — I2510 Atherosclerotic heart disease of native coronary artery without angina pectoris: Secondary | ICD-10-CM | POA: Diagnosis not present

## 2020-07-08 DIAGNOSIS — R918 Other nonspecific abnormal finding of lung field: Secondary | ICD-10-CM | POA: Diagnosis not present

## 2020-07-08 DIAGNOSIS — Z853 Personal history of malignant neoplasm of breast: Secondary | ICD-10-CM | POA: Diagnosis not present

## 2020-07-08 DIAGNOSIS — J431 Panlobular emphysema: Secondary | ICD-10-CM | POA: Diagnosis not present

## 2020-07-08 DIAGNOSIS — E1169 Type 2 diabetes mellitus with other specified complication: Secondary | ICD-10-CM | POA: Diagnosis not present

## 2020-07-08 DIAGNOSIS — E785 Hyperlipidemia, unspecified: Secondary | ICD-10-CM | POA: Diagnosis not present

## 2020-07-08 DIAGNOSIS — I700 Atherosclerosis of aorta: Secondary | ICD-10-CM | POA: Diagnosis not present

## 2020-07-08 DIAGNOSIS — I2584 Coronary atherosclerosis due to calcified coronary lesion: Secondary | ICD-10-CM | POA: Diagnosis not present

## 2020-07-08 LAB — POCT GLYCOSYLATED HEMOGLOBIN (HGB A1C): Hemoglobin A1C: 5.8 % — AB (ref 4.0–5.6)

## 2020-07-08 NOTE — Patient Instructions (Addendum)
I recommend trying melatonin for your insomnia.  It is not a sedative,  But must be taken on  a regular basis to help your internal clock.  Take every evening after dinner start with 3 mg dose   Max effective dose is 10 mg  You can also try using " Headspace".  This  is a phone app that teaches you to meditate and empty your mind of thoughts that interfere with space    Crystal Meyers may want to ready about the [employer] diet if she plateaus .  Your a1c is 5.8  .  So those "hot flashes"  MAY BE LOW BLOOD SUGARS  Please start a daily walk OUTSIDE, and work your way up to 30 minutes

## 2020-07-08 NOTE — Progress Notes (Signed)
Patient ID: [name], female    DOB: 03/05/1946  Age: 74 y.o. MRN: [id number]  The patient is here for follow up and  management of other chronic and acute problems.   The risk factors are reflected in the social history.  The roster of all physicians providing medical care to patient - is listed in the Snapshot section of the chart.  Activities of daily living:  The patient is 100% independent in all ADLs: dressing, toileting, feeding as well as independent mobility  Home safety : The patient has smoke detectors in the home. They wear seatbelts.  There are no firearms at home. There is no violence in the home.   There is no risks for hepatitis, STDs or HIV. There is no   history of blood transfusion. They have no travel history to infectious disease endemic areas of the world.  The patient has seen their dentist in the last six month. They have seen their eye doctor in the last year. They admit to slight hearing difficulty with regard to whispered voices and some television programs.  They have deferred audiologic testing in the last year.  They do not  have excessive sun exposure. Discussed the need for sun protection: hats, long sleeves and use of sunscreen if there is significant sun exposure.   Diet: the importance of a healthy diet is discussed. They do have a healthy diet.  The benefits of regular aerobic exercise were discussed. She walks 4 times per week ,  20 minutes.   Depression screen: there are no signs or vegative symptoms of depression- irritability, change in appetite, anhedonia, sadness/tearfullness.  Cognitive assessment: the patient manages all their financial and personal affairs and is actively engaged. They could relate day,date,year and events; recalled 2/3 objects at 3 minutes; performed clock-face test normally.  The following portions of the patient's history were reviewed and updated as appropriate: allergies, current medications, past family history, past  medical history,  past surgical history, past social history  and problem list.  Visual acuity was not assessed per patient preference since she has regular follow up with her ophthalmologist. Hearing and body mass index were assessed and reviewed.   During the course of the visit the patient was educated and counseled about appropriate screening and preventive services including : fall prevention , diabetes screening, nutrition counseling, colorectal cancer screening, and recommended immunizations.    CC: The primary encounter diagnosis was Hyperlipidemia associated with type 2 diabetes mellitus ([place]). Diagnoses of Panlobular emphysema ([place]), History of breast cancer in female, Acquired hypothyroidism, Multiple pulmonary nodules, Aortic atherosclerosis ([place]), Coronary artery disease due to calcified coronary lesion, Essential hypertension, and Family history of colon cancer requiring screening colonoscopy were also pertinent to this visit.  Health maintenance:  S/p bilateral mastectomy tubular adenomas by oct 2021 colonoscopy  By Dr [name])  Follow up  Due  In 3 or 5 ?  Aortic atherosclerosis noted on annual lung CT Dec 15  Discussed today  Centrilobular emphysema also noted And addressed.  Still smoking . Since age 86..  Quit during treatment of BRCA  But restarted after treatment .    Smokes to relax.  Takes effexor. . Did not tolerate chantix due to insomnia.  Smokes 3/4 pack daily .  Doesn't walk  Regularly so not sure if would have dyspnea.   OSA: wearing CPAP every night,   8 to 9 hours per night.   Having episodes  3 nights per week of night sweats.  Once per night.  Not accompanied by tachycardia,  Occur Sometimes during sleep.  Has not checked BS during any of the episodes   DM: following KETO friendly diet  Not exercising regularly.  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 "not working," falling asleep but not staying  asleep .    History Almee has a past medical history of Anxiety, Asthma, Barrett's esophagus, Breast cancer ([place]) (7/10), Colon polyps, Complication of anesthesia, COPD (chronic obstructive pulmonary disease) ([place]), Coronary artery disease, Depression, GERD (gastroesophageal reflux disease), Hypercholesteremia, Hypertension, Hypothyroidism, Personal history of tobacco use, presenting hazards to health (05/06/2015), Personal history of tobacco use, presenting hazards to health (05/06/2015), Pneumonia, PVC (premature ventricular contraction), Skin cancer, Sleep apnea, Trigeminal neuralgia, and Trigeminal neuralgia.   She has a past surgical history that includes Mastectomy (05/2009); Mastectomy; Lumbar laminectomy/decompression microdiscectomy (Left, 08/11/2015); Breast surgery (Bilateral, 2011); Colonoscopy w/ polypectomy; Craniectomy (Right, 01/21/2016); Craniotomy (Right, 02/03/2016); skin cancer removal; repair ectropion extensive (Bilateral); Esophagogastroduodenoscopy (N/A, 04/20/2020); and Colonoscopy with propofol (N/A, 04/20/2020).   Her family history includes Alzheimer's disease in her mother; Cancer in her brother; Colon cancer in her father; Deep vein thrombosis in her father; Diabetes in her brother, brother, father, and mother; Heart attack in her father; Hyperlipidemia in her father and mother; Hypertension in her father and mother; Mental illness in her mother; Stroke in her father and maternal grandmother.She reports that she has been smoking cigarettes. She has a 54.00 pack-year smoking history. She has never used smokeless tobacco. She reports that she does not drink alcohol and does not use drugs.  Outpatient Medications Prior to Visit  Medication Sig Dispense Refill  . Accu-Chek Softclix Lancets lancets Use to check sugar once daily. Dx: E11.9 100 each 3  . aspirin EC 81 MG tablet Take 1 tablet (81 mg total) by mouth daily. 90 tablet 1  . atorvastatin (LIPITOR) 40 MG tablet TAKE 1  TABLET BY MOUTH EVERY DAY 90 tablet 1  . blood glucose meter kit and supplies Dispense based on patient and insurance preference. Use to check sugar once daily as directed. (FOR ICD-10 E10.9, E11.9). 1 each 0  . glucose blood (ACCU-CHEK AVIVA PLUS) test strip Use to check sugar once daily. Dx. E11.9 100 each 3  . levothyroxine (SYNTHROID) 88 MCG tablet TAKE 1 TABLET (88 MCG TOTAL) BY MOUTH DAILY BEFORE BREAKFAST. 90 tablet 0  . losartan (COZAAR) 100 MG tablet TAKE 1 TABLET BY MOUTH EVERYDAY AT BEDTIME 90 tablet 3  . metFORMIN (GLUCOPHAGE-XR) 500 MG 24 hr tablet TAKE 1 TABLET BY MOUTH EVERY DAY WITH BREAKFAST 90 tablet 1  . Multiple Vitamin (MULTIVITAMIN) tablet Take 1 tablet by mouth daily.    . nitroGLYCERIN (NITROSTAT) 0.4 MG SL tablet Place under the tongue.    [name] omeprazole (PRILOSEC) 40 MG capsule TAKE 1 CAPSULE BY MOUTH EVERY DAY 90 capsule 1  . rOPINIRole (REQUIP) 0.25 MG tablet TAKE 1 TABLET (0.25 MG TOTAL) BY MOUTH AT BEDTIME. MAY INCREASE WEEKLY AS NEEDED FOR RLS    . traZODone (DESYREL) 100 MG tablet Take 100 mg by mouth at bedtime.    [name] venlafaxine XR (EFFEXOR-XR) 150 MG 24 hr capsule TAKE 1 CAPSULE BY MOUTH EVERY DAY 90 capsule 1   No facility-administered medications prior to visit.    Review of Systems   Patient denies headache, fevers, malaise, unintentional weight loss, skin rash, eye pain, sinus congestion and sinus pain, sore throat, dysphagia,  hemoptysis , cough, dyspnea, wheezing, chest pain, palpitations, orthopnea, edema, abdominal pain, nausea, melena, diarrhea, constipation, flank pain, dysuria, hematuria, urinary  Frequency, nocturia, numbness, tingling, seizures,  Focal weakness, Loss of consciousness,  Tremor,  depression, anxiety, and suicidal ideation.     Objective:  BP 130/70 (BP Location: Left Arm, Patient Position: Sitting, Cuff Size: Normal)   Pulse 90   Temp 98.3 F (36.8 C) (Oral)   Resp 15   Ht _0  (1.676 m)   Wt 170 lb 12.8 oz (77.5 kg)   SpO2 99%    BMI 27.57 kg/m   Physical Exam  General appearance: alert, cooperative and appears stated age Ears: normal TM's and external ear canals both ears Throat: lips, mucosa, and tongue normal; teeth and gums normal Neck: no adenopathy, no carotid bruit, supple, symmetrical, trachea midline and thyroid not enlarged, symmetric, no tenderness/mass/nodules Back: symmetric, no curvature. ROM normal. No CVA tenderness. Lungs: clear to auscultation bilaterally Heart: regular rate and rhythm, S1, S2 normal, no murmur, click, rub or gallop Abdomen: soft, non-tender; bowel sounds normal; no masses,  no organomegaly Pulses: 2+ and symmetric Skin: Skin color, texture, turgor normal. No rashes or lesions Lymph nodes: Cervical, supraclavicular, and axillary nodes normal.  Assessment & Plan:   Problem List Items Addressed This Visit      Unprioritized   Aortic atherosclerosis ([place])    Reviewed findings of prior CT scan today..  Patient is tolerating high potency statin therapy       COPD (chronic obstructive pulmonary disease) with emphysema ([facility])    Asymptomatic but sedentary.  Encouraged to quit smoking and start walking       Coronary artery disease due to calcified coronary lesion    She is asymptomatic but has not exercised in years.  encouraged to begin a walking program and to quit smoking.       Essential hypertension    Well controlled on current regimen of losartan 100 mg daily . Renal function stable, no changes today.  Lab Results  Component Value Date   CREATININE 1.06 (H) 06/08/2020   Lab Results  Component Value Date   NA 141 06/08/2020   K 5.0 06/08/2020   CL 101 06/08/2020   CO2 31 06/08/2020         Family history of colon cancer requiring screening colonoscopy    Colonoscopy was done Oct 2021,  TA was found .  3 yr follow up advised       History of breast cancer in female    She continues to see her oncologist annually since her treatment in 2017.  She is s/p  bilaterally mastectomy      Hyperlipidemia associated with type 2 diabetes mellitus ([place]) - Primary    Excellent  Control of cholesterol with atorvastatin  ,  Excellent  glycemic control on metformin XR 500 mg daily , likely improved with weight loss.  Continue metformin, losartan. . Statin and aspirin daily dose reduced to 2 times per week   Lab Results  Component Value Date   HGBA1C 5.8 (A) 07/08/2020   Lab Results  Component Value Date   CHOL 120 04/08/2020   HDL 46.50 04/08/2020   LDLCALC 47 04/08/2020   LDLDIRECT 71.0 08/31/2016   TRIG 132.0 04/08/2020   CHOLHDL 3 04/08/2020         Relevant Orders   POCT HgB A1C (Completed)   Hypothyroidism    Thyroid function is WNL on current dose.  No current changes  needed.   Lab Results  Component Value Date   TSH 2.41 04/08/2020         Multiple pulmonary nodules    She is maintaining annual surveillance with Lung CT        A total of 40 minutes was spent with patient more than half of which was spent in counseling patient on the above mentioned issues , reviewing and explaining recent labs and imaging studies done, and coordination of care.  I am having Kanda D. Durbin maintain her nitroGLYCERIN, rOPINIRole, aspirin EC, blood glucose meter kit and supplies, multivitamin, Accu-Chek Aviva Plus, Accu-Chek Softclix Lancets, losartan, metFORMIN, venlafaxine XR, traZODone, levothyroxine, atorvastatin, and omeprazole.  No orders of the defined types were placed in this encounter.   There are no discontinued medications.  Follow-up: Return in about 3 months (around 10/06/2020) for follow up diabetes.   [name], MD

## 2020-07-08 NOTE — Assessment & Plan Note (Signed)
Asymptomatic but sedentary.  Encouraged to quit smoking and start walking

## 2020-07-10 DIAGNOSIS — I700 Atherosclerosis of aorta: Secondary | ICD-10-CM | POA: Insufficient documentation

## 2020-07-10 NOTE — Assessment & Plan Note (Signed)
Reviewed findings of prior CT scan today..  Patient is tolerating high potency statin therapy  

## 2020-07-10 NOTE — Assessment & Plan Note (Addendum)
Excellent  Control of cholesterol with atorvastatin  ,  Excellent  glycemic control on metformin XR 500 mg daily , likely improved with weight loss.  Continue metformin, losartan. . Statin and aspirin daily dose reduced to 2 times per week   Lab Results  Component Value Date   HGBA1C 5.8 (A) 07/08/2020   Lab Results  Component Value Date   CHOL 120 04/08/2020   HDL 46.50 04/08/2020   LDLCALC 47 04/08/2020   LDLDIRECT 71.0 08/31/2016   TRIG 132.0 04/08/2020   CHOLHDL 3 04/08/2020

## 2020-07-10 NOTE — Assessment & Plan Note (Signed)
Thyroid function is WNL on current dose.  No current changes needed.   Lab Results  Component Value Date   TSH 2.41 04/08/2020

## 2020-07-10 NOTE — Assessment & Plan Note (Signed)
She continues to see her oncologist annually since her treatment in 2017.  She is s/p bilaterally mastectomy

## 2020-07-10 NOTE — Assessment & Plan Note (Signed)
Colonoscopy was done Oct 2021,  TA was found .  3 yr follow up advised

## 2020-07-10 NOTE — Assessment & Plan Note (Signed)
She is maintaining annual surveillance with Lung CT

## 2020-07-10 NOTE — Assessment & Plan Note (Signed)
She is asymptomatic but has not exercised in years.  encouraged to begin a walking program and to quit smoking.

## 2020-07-10 NOTE — Assessment & Plan Note (Signed)
Well controlled on current regimen of losartan 100 mg daily . Renal function stable, no changes today.  Lab Results  Component Value Date   CREATININE 1.06 (H) 06/08/2020   Lab Results  Component Value Date   NA 141 06/08/2020   K 5.0 06/08/2020   CL 101 06/08/2020   CO2 31 06/08/2020

## 2020-08-22 ENCOUNTER — Other Ambulatory Visit: Payer: Self-pay | Admitting: Internal Medicine

## 2020-09-04 ENCOUNTER — Other Ambulatory Visit: Payer: Self-pay | Admitting: Internal Medicine

## 2020-10-06 ENCOUNTER — Other Ambulatory Visit: Payer: Self-pay

## 2020-10-06 ENCOUNTER — Encounter: Payer: Self-pay | Admitting: Internal Medicine

## 2020-10-06 ENCOUNTER — Ambulatory Visit (INDEPENDENT_AMBULATORY_CARE_PROVIDER_SITE_OTHER): Payer: Medicare Other | Admitting: Internal Medicine

## 2020-10-06 VITALS — BP 134/74 | HR 85 | Temp 98.0°F | Ht 65.98 in | Wt 177.4 lb

## 2020-10-06 DIAGNOSIS — J431 Panlobular emphysema: Secondary | ICD-10-CM | POA: Diagnosis not present

## 2020-10-06 DIAGNOSIS — D689 Coagulation defect, unspecified: Secondary | ICD-10-CM | POA: Diagnosis not present

## 2020-10-06 DIAGNOSIS — E785 Hyperlipidemia, unspecified: Secondary | ICD-10-CM | POA: Diagnosis not present

## 2020-10-06 DIAGNOSIS — I700 Atherosclerosis of aorta: Secondary | ICD-10-CM

## 2020-10-06 DIAGNOSIS — E1169 Type 2 diabetes mellitus with other specified complication: Secondary | ICD-10-CM

## 2020-10-06 DIAGNOSIS — I2584 Coronary atherosclerosis due to calcified coronary lesion: Secondary | ICD-10-CM | POA: Diagnosis not present

## 2020-10-06 DIAGNOSIS — I2510 Atherosclerotic heart disease of native coronary artery without angina pectoris: Secondary | ICD-10-CM

## 2020-10-06 DIAGNOSIS — E039 Hypothyroidism, unspecified: Secondary | ICD-10-CM

## 2020-10-06 DIAGNOSIS — K22719 Barrett's esophagus with dysplasia, unspecified: Secondary | ICD-10-CM

## 2020-10-06 NOTE — Patient Instructions (Signed)
Coronary Artery Disease, Female Coronary artery disease (CAD) is a condition in which the arteries that lead to the heart (coronary arteries) become narrow or blocked. The narrowing or blockage can lead to decreased blood flow to the heart. Prolonged reduced blood flow can cause a heart attack (myocardial infarction or MI). This condition may also be called coronary heart disease. Because CAD is the leading cause of death in women, it is important to understand what causes this condition and how it is treated. What are the causes? CAD is most often caused by atherosclerosis. This is the buildup of fat and cholesterol (plaque) on the inside of the arteries. Over time, the plaque may narrow or block the artery, reducing blood flow to the heart. Plaque can also become weak and break off within a coronary artery and cause a sudden blockage. Other less common causes of CAD include:  A blood clot or a piece of a blood clot or other substance that blocks the flow of blood in a coronary artery (embolism).  A tearing of the artery (spontaneous coronary artery dissection).  An enlargement of an artery (aneurysm).  Inflammation (vasculitis) in the artery wall.   What increases the risk? The following factors may make you more likely to develop this condition:  Age. Women over age 70 are at a greater risk of CAD.  Family history of CAD.  High blood pressure (hypertension).  Diabetes.  High cholesterol levels.  Tobacco use.  Lack of exercise.  Menopause. ? All postmenopausal women are at greater risk of CAD. ? Women who have experienced menopause between the ages of 45-45 (early menopause) are at a higher risk of CAD. ? Women who have experienced menopause before age 23 (premature menopause) are at a very high risk of CAD.  Excessive alcohol use.  A diet high in saturated and trans fats, such as fried food and processed meat. Other possible risk factors include:  High stress  levels.  Depression.  Obesity.  Sleep apnea. What are the signs or symptoms? Many people do not have any symptoms during the early stages of CAD. As the condition progresses, symptoms may include:  Chest pain (angina). The pain can: ? Feel like crushing or squeezing, or like a tightness, pressure, fullness, or heaviness in the chest. ? Last more than a few minutes or can stop and recur. The pain tends to get worse with exercise or stress and to fade with rest.  Pain in the arms, neck, jaw, ear, or back.  Unexplained heartburn or indigestion.  Shortness of breath.  Nausea.  Sudden cold sweats.  Sudden light-headedness.  Fluttering or fast heartbeat (palpitations). Many women have chest discomfort and the other symptoms. However, women often have unusual (atypical) symptoms, such as:  Fatigue.  Vomiting.  Unexplained feelings of nervousness or anxiety.  Unexplained weakness.  Dizziness or fainting. How is this diagnosed? This condition is diagnosed based on:  Your family and medical history.  A physical exam.  Tests, including: ? A test to check the electrical signals in your heart (electrocardiogram). ? Exercise stress test. This looks for signs of blockage when the heart is stressed with exercise, such as running on a treadmill. ? Pharmacologic stress test. This test looks for signs of blockage when the heart is being stressed with a medicine. ? Blood tests. ? Coronary angiogram. This is a procedure to look at the coronary arteries to see if there is any blockage. During this test, a dye is injected into your arteries  so they appear on an X-ray. ? Coronary artery CT scan. This CT scan helps detect calcium deposits in your coronary arteries. Calcium deposits are an indicator of CAD. ? A test that uses sound waves to take a picture of your heart (echocardiogram). ? Chest X-ray. How is this treated? This condition may be treated by:  Healthy lifestyle changes to  reduce risk factors.  Medicines such as: ? Antiplatelet medicines and blood-thinning medicines, such as aspirin. These help to prevent blood clots. ? Nitroglycerin. ? Blood pressure medicines. ? Cholesterol-lowering medicine.  Coronary angioplasty and stenting. During this procedure, a thin, flexible tube is inserted through a blood vessel and into a blocked artery. A balloon or similar device on the end of the tube is inflated to open up the artery. In some cases, a small, mesh tube (stent) is inserted into the artery to keep it open.  Coronary artery bypass surgery. During this surgery, veins or arteries from other parts of the body are used to create a bypass around the blockage and allow blood to reach your heart. Follow these instructions at home: Medicines  Take over-the-counter and prescription medicines only as told by your health care provider.  Do not take the following medicines unless your health care provider approves: ? NSAIDs, such as ibuprofen, naproxen, or celecoxib. ? Vitamin supplements that contain vitamin A, vitamin E, or both. ? Hormone replacement therapy that contains estrogen with or without progestin. Lifestyle  Follow an exercise program approved by your health care provider. Aim for 150 minutes of moderate exercise or 75 minutes of vigorous exercise each week.  Maintain a healthy weight or lose weight as approved by your health care provider.  Learn to manage stress or try to limit your stress. Ask your health care provider for suggestions if you need help.  Get screened for depression and seek treatment, if needed.  Do not use any products that contain nicotine or tobacco, such as cigarettes, e-cigarettes, and chewing tobacco. If you need help quitting, ask your health care provider.  Do not use illegal drugs. Eating and drinking  Follow a heart-healthy diet. A dietitian can help educate you about healthy food options and changes. In general, eat plenty  of fruits and vegetables, lean meats, and whole grains.  Avoid foods high in: ? Sugar. ? Salt (sodium). ? Saturated fats, such as processed or fatty meat. ? Trans fats, such as fried food.  Use healthy cooking methods such as roasting, grilling, broiling, baking, poaching, steaming, or stir-frying.  Do not drink alcohol if: ? Your health care provider tells you not to drink. ? You are pregnant, may be pregnant, or are planning to become pregnant.  If you drink alcohol: ? Limit how much you have to 0-1 drink a day. ? Be aware of how much alcohol is in your drink. In the [place] one drink equals one 12 oz bottle of beer (355 mL), one 5 oz glass of wine (148 mL), or one 1 oz glass of hard liquor (44 mL).   General instructions  Manage any other health conditions, such as hypertension and diabetes. These conditions affect your heart.  Your health care provider may ask you to monitor your blood pressure. Ideally, your blood pressure should be below 130/80.  Keep all follow-up visits as told by your health care provider. This is important. Get help right away if:  You have pain in your chest, neck, ear, arm, jaw, stomach, or back that: ? Lasts more than a  few minutes. ? Is recurring. ? Is not relieved by taking medicine under your tongue (sublingual nitroglycerin).  You have profuse sweating without cause.  You have unexplained: ? Heartburn or indigestion. ? Shortness of breath or difficulty breathing. ? Fluttering or fast heartbeat (palpitations). ? Nausea or vomiting. ? Fatigue. ? Feelings of nervousness or anxiety. ? Weakness. ? Diarrhea.  You have sudden light-headedness or dizziness.  You faint.  You feel like hurting yourself or think about taking your own life. These symptoms may represent a serious problem that is an emergency. Do not wait to see if the symptoms will go away. Get medical help right away. Call your local emergency services (911 in the [place] Do not  drive yourself to the hospital. Summary  Coronary artery disease (CAD) is a condition in which the arteries that lead to the heart (coronary arteries) become narrow or blocked. The narrowing or blockage can lead to a heart attack.  Many women have chest discomfort and other common symptoms of CAD. However, women often have unusual (atypical) symptoms, such as fatigue, vomiting, weakness, or dizziness.  CAD can be treated with lifestyle changes, medicines, surgery, or a combination of these treatments. This information is not intended to replace advice given to you by your health care provider. Make sure you discuss any questions you have with your health care provider. Document Revised: 03/16/2018 Document Reviewed: 03/06/2018 Elsevier Patient Education  2021 [employer].

## 2020-10-06 NOTE — Progress Notes (Signed)
Subjective:  Patient ID: Crystal Meyers, female    DOB: 1946-04-25  Age: 75 y.o. MRN: [id number]  CC: The primary encounter diagnosis was Coagulation disorder ([place]). Diagnoses of Panlobular emphysema ([place]), Hyperlipidemia associated with type 2 diabetes mellitus ([place]), Aortic atherosclerosis ([place]), Acquired hypothyroidism, Coronary artery disease due to calcified coronary lesion, and Barrett's esophagus with dysplasia were also pertinent to this visit.  HPI Crystal Meyers presents for follow up on type 2 DM,  Aortic atherosclerosis,  OSA etc  This visit occurred during the SARS-CoV-2 public health emergency.  Safety protocols were in place, including screening questions prior to the visit, additional usage of staff PPE, and extensive cleaning of exam room while observing appropriate contact time as indicated for disinfecting solutions.     T2DM:  She  feels generally well,  But is not walking regularly or trying to lose weight.  Eats one meal daily:  Usually soup, tuna sub,  Snacks on nuts Checking  blood sugars less than once daily at variable times, usually only if she feels she may be having a hypoglycemic event. .  BS have been under 130 fasting and < 150 post prandially.  Denies any recent hypoglyemic events.  Taking   medications as directed. Following a carbohydrate modified diet 6 days per week. Denies numbness, burning and tingling of extremities. Appetite is good.   Has gained weight from neighbors' good will food donations during her mild COVID infection last month   CT chest reviewed: she has CAD including left main   Wants to have her gel removed since the left one has become misshapen and wrinkled   . ten years ago .  Discussed referral to [facility]     Outpatient Medications Prior to Visit  Medication Sig Dispense Refill  . Accu-Chek Softclix Lancets lancets Use to check sugar once daily. Dx: E11.9 100 each 3  . aspirin EC 81 MG tablet Take 1 tablet (81 mg total) by  mouth daily. 90 tablet 1  . atorvastatin (LIPITOR) 40 MG tablet TAKE 1 TABLET BY MOUTH EVERY DAY 90 tablet 1  . blood glucose meter kit and supplies Dispense based on patient and insurance preference. Use to check sugar once daily as directed. (FOR ICD-10 E10.9, E11.9). 1 each 0  . glucose blood (ACCU-CHEK AVIVA PLUS) test strip Use to check sugar once daily. Dx. E11.9 100 each 3  . hydrochlorothiazide (HYDRODIURIL) 25 MG tablet Take 25 mg by mouth daily.    [name] levothyroxine (SYNTHROID) 88 MCG tablet TAKE 1 TABLET (88 MCG TOTAL) BY MOUTH DAILY BEFORE BREAKFAST. 90 tablet 0  . losartan (COZAAR) 100 MG tablet TAKE 1 TABLET BY MOUTH EVERYDAY AT BEDTIME 90 tablet 3  . metFORMIN (GLUCOPHAGE-XR) 500 MG 24 hr tablet TAKE 1 TABLET BY MOUTH EVERY DAY WITH BREAKFAST 90 tablet 1  . Multiple Vitamin (MULTIVITAMIN) tablet Take 1 tablet by mouth daily.    . nitroGLYCERIN (NITROSTAT) 0.4 MG SL tablet Place under the tongue.    [name] omeprazole (PRILOSEC) 40 MG capsule TAKE 1 CAPSULE BY MOUTH EVERY DAY 90 capsule 1  . rOPINIRole (REQUIP) 0.25 MG tablet TAKE 1 TABLET (0.25 MG TOTAL) BY MOUTH AT BEDTIME. MAY INCREASE WEEKLY AS NEEDED FOR RLS    . traZODone (DESYREL) 100 MG tablet Take 100 mg by mouth at bedtime.    [name] venlafaxine XR (EFFEXOR-XR) 150 MG 24 hr capsule TAKE 1 CAPSULE BY MOUTH EVERY DAY 90 capsule 1   No facility-administered medications prior to  visit.    Review of Systems;  Patient denies headache, fevers, malaise, unintentional weight loss, skin rash, eye pain, sinus congestion and sinus pain, sore throat, dysphagia,  hemoptysis , cough, dyspnea, wheezing, chest pain, palpitations, orthopnea, edema, abdominal pain, nausea, melena, diarrhea, constipation, flank pain, dysuria, hematuria, urinary  Frequency, nocturia, numbness, tingling, seizures,  Focal weakness, Loss of consciousness,  Tremor, insomnia, depression, anxiety, and suicidal ideation.      Objective:  BP 134/74 (BP Location: Left Arm,  Patient Position: Sitting)   Pulse 85   Temp 98 F (36.7 C)   Ht 5' 5.98" (1.676 m)   Wt 177 lb 6.4 oz (80.5 kg)   SpO2 95%   BMI 28.65 kg/m   BP Readings from Last 3 Encounters:  10/06/20 134/74  07/08/20 130/70  06/08/20 121/76    Wt Readings from Last 3 Encounters:  10/06/20 177 lb 6.4 oz (80.5 kg)  07/08/20 170 lb 12.8 oz (77.5 kg)  06/24/20 170 lb (77.1 kg)    General appearance: alert, cooperative and appears stated age Ears: normal TM's and external ear canals both ears Throat: lips, mucosa, and tongue normal; teeth and gums normal Neck: no adenopathy, no carotid bruit, supple, symmetrical, trachea midline and thyroid not enlarged, symmetric, no tenderness/mass/nodules Back: symmetric, no curvature. ROM normal. No CVA tenderness. Lungs: clear to auscultation bilaterally Heart: regular rate and rhythm, S1, S2 normal, no murmur, click, rub or gallop Abdomen: soft, non-tender; bowel sounds normal; no masses,  no organomegaly Pulses: 2+ and symmetric Skin: Skin color, texture, turgor normal. No rashes or lesions Lymph nodes: Cervical, supraclavicular, and axillary nodes normal.  Lab Results  Component Value Date   HGBA1C 5.8 (A) 07/08/2020   HGBA1C 6.0 04/08/2020   HGBA1C 6.9 (H) 04/10/2019    Lab Results  Component Value Date   CREATININE 1.06 (H) 06/08/2020   CREATININE 0.98 04/08/2020   CREATININE 1.12 (H) 04/15/2019    Lab Results  Component Value Date   WBC 7.6 06/08/2020   HGB 13.9 06/08/2020   HCT 41.4 06/08/2020   PLT 232 06/08/2020   GLUCOSE 118 (H) 06/08/2020   CHOL 120 04/08/2020   TRIG 132.0 04/08/2020   HDL 46.50 04/08/2020   LDLDIRECT 71.0 08/31/2016   LDLCALC 47 04/08/2020   ALT 29 06/08/2020   AST 28 06/08/2020   NA 141 06/08/2020   K 5.0 06/08/2020   CL 101 06/08/2020   CREATININE 1.06 (H) 06/08/2020   BUN 23 06/08/2020   CO2 31 06/08/2020   TSH 2.41 04/08/2020   HGBA1C 5.8 (A) 07/08/2020   MICROALBUR 0.7 04/08/2020    CT  CHEST LUNG CANCER SCREENING LOW DOSE WO CONTRAST  Result Date: 06/25/2020 CLINICAL DATA:  75 year old female current smoker with 30 pack-year history of smoking. Lung cancer screening examination. EXAM: CT CHEST WITHOUT CONTRAST LOW-DOSE FOR LUNG CANCER SCREENING TECHNIQUE: Multidetector CT imaging of the chest was performed following the standard protocol without IV contrast. COMPARISON:  Low-dose lung cancer screening chest CT 06/12/2019. FINDINGS: Cardiovascular: Heart size is normal. There is no significant pericardial fluid, thickening or pericardial calcification. There is aortic atherosclerosis, as well as atherosclerosis of the great vessels of the mediastinum and the coronary arteries, including calcified atherosclerotic plaque in the left main, left anterior descending and right coronary arteries. Mediastinum/Nodes: No pathologically enlarged mediastinal or hilar lymph nodes. Please note that accurate exclusion of hilar adenopathy is limited on noncontrast CT scans. Esophagus is unremarkable in appearance. No axillary lymphadenopathy. Lungs/Pleura: Several small pulmonary  nodules are noted in the lungs, largest of which is in the medial segment of the right middle lobe (axial image 247 of series 3), with a volume derived mean diameter of 2.6 mm. No larger more suspicious appearing pulmonary nodules or masses are noted. No acute consolidative airspace disease. No pleural effusions. Diffuse bronchial wall thickening with mild centrilobular and paraseptal emphysema. Bilateral apical pleuroparenchymal thickening and architectural distortion, most compatible with areas of chronic post infectious or inflammatory scarring. Upper Abdomen: Aortic atherosclerosis. Musculoskeletal: There are no aggressive appearing lytic or blastic lesions noted in the visualized portions of the skeleton. Bilateral breast implants are incidentally noted. IMPRESSION: 1. Lung-RADS 2S, benign appearance or behavior. Continue annual  screening with low-dose chest CT without contrast in 12 months. 2. The "S" modifier above refers to potentially clinically significant non lung cancer related findings. Specifically, there is aortic atherosclerosis, in addition to left main and 2 vessel coronary artery disease. Assessment for potential risk factor modification, dietary therapy or pharmacologic therapy may be warranted, if clinically indicated. 3. Mild diffuse bronchial wall thickening with mild centrilobular and paraseptal emphysema; imaging findings suggestive of underlying COPD. Aortic Atherosclerosis (ICD10-I70.0) and Emphysema (ICD10-J43.9). Electronically Signed   By: Vinnie Langton M.D.   On: 06/25/2020 08:14    Assessment & Plan:   Problem List Items Addressed This Visit      Unprioritized   RESOLVED: Coagulation disorder ([place]) - Primary   Hypothyroidism   Relevant Orders   TSH   Hyperlipidemia associated with type 2 diabetes mellitus ([place])    Excellent  Control of cholesterol with atorvastatin  ,  Excellent  glycemic control on metformin XR 500 mg daily , likely improved with weight loss.  Continue metformin, losartan. . Continue Statin and aspirin daily dose reduced to 2 times per week   Lab Results  Component Value Date   HGBA1C 5.8 (A) 07/08/2020   Lab Results  Component Value Date   CHOL 120 04/08/2020   HDL 46.50 04/08/2020   LDLCALC 47 04/08/2020   LDLDIRECT 71.0 08/31/2016   TRIG 132.0 04/08/2020   CHOLHDL 3 04/08/2020         Relevant Orders   Hemoglobin A1c   Microalbumin / creatinine urine ratio   Lipid panel   Comprehensive metabolic panel   Coronary artery disease due to calcified coronary lesion    Left main,  LAD and RCA calcifications noted on CT. encouraged to address the one risk factor she still has (tobacco abuse)                   Relevant Medications   hydrochlorothiazide (HYDRODIURIL) 25 MG tablet   Aortic atherosclerosis ([place])    Reviewed findings of prior CT  scan today..  Patient is tolerating high potency statin therapy 2 times per week       Relevant Medications   hydrochlorothiazide (HYDRODIURIL) 25 MG tablet   COPD (chronic obstructive pulmonary disease) with emphysema ([place])   Barrett's esophagus    She is reminded to continue a daily dose of omeprazole.  Last EGD was done in 2016; no repeat needed per Vira Agar          I am having Crystal Meyers maintain her nitroGLYCERIN, rOPINIRole, aspirin EC, blood glucose meter kit and supplies, multivitamin, Accu-Chek Aviva Plus, Accu-Chek Softclix Lancets, losartan, traZODone, levothyroxine, atorvastatin, omeprazole, metFORMIN, venlafaxine XR, and hydrochlorothiazide.  No orders of the defined types were placed in this encounter.   There are no discontinued medications.  Follow-up: Return in about 3 months (around 01/06/2021) for follow up diabetes.   [name], MD

## 2020-10-06 NOTE — Assessment & Plan Note (Signed)
Reviewed findings of prior CT scan today..  Patient is tolerating high potency statin therapy 2 times per week

## 2020-10-06 NOTE — Assessment & Plan Note (Addendum)
Excellent  Control of cholesterol with atorvastatin  ,  Excellent  glycemic control on metformin XR 500 mg daily , likely improved with weight loss.  Continue metformin, losartan. . Continue Statin and aspirin daily dose reduced to 2 times per week   Lab Results  Component Value Date   HGBA1C 5.8 (A) 07/08/2020   Lab Results  Component Value Date   CHOL 120 04/08/2020   HDL 46.50 04/08/2020   LDLCALC 47 04/08/2020   LDLDIRECT 71.0 08/31/2016   TRIG 132.0 04/08/2020   CHOLHDL 3 04/08/2020

## 2020-10-06 NOTE — Assessment & Plan Note (Signed)
Left main,  LAD and RCA calcifications noted on CT. encouraged to address the one risk factor she still has (tobacco abuse)

## 2020-10-06 NOTE — Assessment & Plan Note (Signed)
She is reminded to continue a daily dose of omeprazole.  Last EGD was done in 2016; no repeat needed per [facility]

## 2020-11-20 ENCOUNTER — Other Ambulatory Visit: Payer: Self-pay | Admitting: Internal Medicine

## 2020-12-02 ENCOUNTER — Other Ambulatory Visit: Payer: Self-pay | Admitting: Internal Medicine

## 2020-12-31 ENCOUNTER — Other Ambulatory Visit: Payer: Self-pay | Admitting: Internal Medicine

## 2021-01-07 ENCOUNTER — Ambulatory Visit: Payer: Medicare Other | Admitting: Internal Medicine

## 2021-01-07 DIAGNOSIS — Z0289 Encounter for other administrative examinations: Secondary | ICD-10-CM

## 2021-01-12 DIAGNOSIS — Z20822 Contact with and (suspected) exposure to covid-19: Secondary | ICD-10-CM | POA: Diagnosis not present

## 2021-01-24 ENCOUNTER — Other Ambulatory Visit: Payer: Self-pay | Admitting: Internal Medicine

## 2021-01-28 ENCOUNTER — Other Ambulatory Visit: Payer: Self-pay

## 2021-01-28 ENCOUNTER — Encounter: Payer: Self-pay | Admitting: Internal Medicine

## 2021-01-28 ENCOUNTER — Ambulatory Visit (INDEPENDENT_AMBULATORY_CARE_PROVIDER_SITE_OTHER): Payer: Medicare Other | Admitting: Internal Medicine

## 2021-01-28 VITALS — BP 118/68 | HR 87 | Temp 96.9°F | Resp 15 | Ht 65.0 in | Wt 184.2 lb

## 2021-01-28 DIAGNOSIS — I700 Atherosclerosis of aorta: Secondary | ICD-10-CM | POA: Diagnosis not present

## 2021-01-28 DIAGNOSIS — F339 Major depressive disorder, recurrent, unspecified: Secondary | ICD-10-CM

## 2021-01-28 DIAGNOSIS — R5383 Other fatigue: Secondary | ICD-10-CM

## 2021-01-28 DIAGNOSIS — E039 Hypothyroidism, unspecified: Secondary | ICD-10-CM

## 2021-01-28 DIAGNOSIS — E538 Deficiency of other specified B group vitamins: Secondary | ICD-10-CM

## 2021-01-28 DIAGNOSIS — G4452 New daily persistent headache (NDPH): Secondary | ICD-10-CM

## 2021-01-28 DIAGNOSIS — E1122 Type 2 diabetes mellitus with diabetic chronic kidney disease: Secondary | ICD-10-CM | POA: Diagnosis not present

## 2021-01-28 DIAGNOSIS — Z720 Tobacco use: Secondary | ICD-10-CM | POA: Diagnosis not present

## 2021-01-28 DIAGNOSIS — M1990 Unspecified osteoarthritis, unspecified site: Secondary | ICD-10-CM

## 2021-01-28 DIAGNOSIS — Z7189 Other specified counseling: Secondary | ICD-10-CM

## 2021-01-28 DIAGNOSIS — E1169 Type 2 diabetes mellitus with other specified complication: Secondary | ICD-10-CM | POA: Diagnosis not present

## 2021-01-28 DIAGNOSIS — I2510 Atherosclerotic heart disease of native coronary artery without angina pectoris: Secondary | ICD-10-CM | POA: Diagnosis not present

## 2021-01-28 DIAGNOSIS — I2584 Coronary atherosclerosis due to calcified coronary lesion: Secondary | ICD-10-CM

## 2021-01-28 DIAGNOSIS — R0600 Dyspnea, unspecified: Secondary | ICD-10-CM

## 2021-01-28 DIAGNOSIS — E785 Hyperlipidemia, unspecified: Secondary | ICD-10-CM | POA: Diagnosis not present

## 2021-01-28 DIAGNOSIS — R0609 Other forms of dyspnea: Secondary | ICD-10-CM

## 2021-01-28 DIAGNOSIS — R4189 Other symptoms and signs involving cognitive functions and awareness: Secondary | ICD-10-CM | POA: Diagnosis not present

## 2021-01-28 DIAGNOSIS — R944 Abnormal results of kidney function studies: Secondary | ICD-10-CM | POA: Diagnosis not present

## 2021-01-28 DIAGNOSIS — N182 Chronic kidney disease, stage 2 (mild): Secondary | ICD-10-CM

## 2021-01-28 LAB — TSH: TSH: 1.65 u[IU]/mL (ref 0.35–5.50)

## 2021-01-28 LAB — CBC WITH DIFFERENTIAL/PLATELET
Basophils Absolute: 0 10*3/uL (ref 0.0–0.1)
Basophils Relative: 0.6 % (ref 0.0–3.0)
Eosinophils Absolute: 0.1 10*3/uL (ref 0.0–0.7)
Eosinophils Relative: 2 % (ref 0.0–5.0)
HCT: 36.4 % (ref 36.0–46.0)
Hemoglobin: 12.5 g/dL (ref 12.0–15.0)
Lymphocytes Relative: 35.3 % (ref 12.0–46.0)
Lymphs Abs: 2.5 10*3/uL (ref 0.7–4.0)
MCHC: 34.5 g/dL (ref 30.0–36.0)
MCV: 88.4 fl (ref 78.0–100.0)
Monocytes Absolute: 0.5 10*3/uL (ref 0.1–1.0)
Monocytes Relative: 7 % (ref 3.0–12.0)
Neutro Abs: 4 10*3/uL (ref 1.4–7.7)
Neutrophils Relative %: 55.1 % (ref 43.0–77.0)
Platelets: 226 10*3/uL (ref 150.0–400.0)
RBC: 4.11 Mil/uL (ref 3.87–5.11)
RDW: 13.6 % (ref 11.5–15.5)
WBC: 7.2 10*3/uL (ref 4.0–10.5)

## 2021-01-28 LAB — LDL CHOLESTEROL, DIRECT: Direct LDL: 66 mg/dL

## 2021-01-28 LAB — COMPREHENSIVE METABOLIC PANEL
ALT: 30 U/L (ref 0–35)
AST: 25 U/L (ref 0–37)
Albumin: 4.1 g/dL (ref 3.5–5.2)
Alkaline Phosphatase: 43 U/L (ref 39–117)
BUN: 17 mg/dL (ref 6–23)
CO2: 29 mEq/L (ref 19–32)
Calcium: 10.1 mg/dL (ref 8.4–10.5)
Chloride: 101 mEq/L (ref 96–112)
Creatinine, Ser: 1.07 mg/dL (ref 0.40–1.20)
GFR: 51.85 mL/min — ABNORMAL LOW (ref >60.00)
Glucose, Bld: 116 mg/dL — ABNORMAL HIGH (ref 70–99)
Potassium: 4.4 mEq/L (ref 3.5–5.1)
Sodium: 140 mEq/L (ref 135–145)
Total Bilirubin: 0.3 mg/dL (ref 0.2–1.2)
Total Protein: 6.5 g/dL (ref 6.0–8.3)

## 2021-01-28 LAB — B12 AND FOLATE PANEL
Folate: 23.8 ng/mL (ref >5.9)
Vitamin B-12: 882 pg/mL (ref 211–911)

## 2021-01-28 LAB — MICROALBUMIN / CREATININE URINE RATIO
Creatinine,U: 98.5 mg/dL
Microalb Creat Ratio: 0.7 mg/g (ref 0.0–30.0)
Microalb, Ur: <0.7 mg/dL (ref 0.0–1.9)

## 2021-01-28 LAB — HEMOGLOBIN A1C: Hgb A1c MFr Bld: 6.4 % (ref 4.6–6.5)

## 2021-01-28 LAB — LIPID PANEL
Cholesterol: 147 mg/dL (ref 0–200)
HDL: 52.5 mg/dL (ref >39.00)
NonHDL: 94.91
Total CHOL/HDL Ratio: 3
Triglycerides: 262 mg/dL — ABNORMAL HIGH (ref 0.0–149.0)
VLDL: 52.4 mg/dL — ABNORMAL HIGH (ref 0.0–40.0)

## 2021-01-28 MED ORDER — BUPROPION HCL ER (XL) 150 MG PO TB24: 150.0000 mg | ORAL_TABLET | Freq: Every day | ORAL | 2 refills | Status: DC

## 2021-01-28 MED ORDER — TRAZODONE HCL 100 MG PO TABS: 150.0000 mg | ORAL_TABLET | Freq: Every day | ORAL | 5 refills | Status: DC

## 2021-01-28 MED ORDER — VENLAFAXINE HCL ER 75 MG PO CP24: 75.0000 mg | ORAL_CAPSULE | Freq: Every day | ORAL | 5 refills | Status: DC

## 2021-01-28 NOTE — Progress Notes (Signed)
Subjective:  Patient ID: Crystal Meyers, female    DOB: 1945/08/05  Age: 75 y.o. MRN: [id number]  CC: The primary encounter diagnosis was Acquired hypothyroidism. Diagnoses of Decreased GFR, Hyperlipidemia associated with type 2 diabetes mellitus ([place]), Dyspnea on exertion, Cognitive changes, Fatigue, unspecified type, B12 deficiency, Tobacco abuse, Coronary artery disease due to calcified coronary lesion, Aortic atherosclerosis ([place]), Arthritis, Type 2 diabetes mellitus with stage 2 chronic kidney disease, without long-term current use of insulin ([place]), Major depressive disorder, recurrent episode with melancholic features ([place]), Headache, new daily persistent ([facility]), and Counseling on substance use and abuse were also pertinent to this visit.  HPI Crystal Meyers presents for follow up on multiple issues including type 2 DM with hyperlipidemia , ckd and ongoing tobacco abuse .  She is accompanied by her daughter who provides additional concerns   This visit occurred during the SARS-CoV-2 public health emergency.  Safety protocols were in place, including screening questions prior to the visit, additional usage of staff PPE, and extensive cleaning of exam room while observing appropriate contact time as indicated for disinfecting solutions.   Cc:  recurrent stabbing pain occurring in the right parietal and temple area.  Occurred 3 times in one short period of time and then resolved.  Has also been having a dull headache every day for the past month.  Initially the pain was on the left side of head,  on her left temple,  then switched to the right side 2 weeks ago. Not present when she wakes up.  Brought on by  bending over to pick things up . Painful enough to stop her activities.  Has not taken anything for it. Resolves spontaneously with rest after about ten minutes. Still smoking,  starts as soon as she wakes up.  Thinks the cigarettes may make it worse, but has not tried abstaining from cigarettes.    CKD:  new diagnosis.[name]  no prior nephrology evaluation   T2 DM:   T2DM:  She  feels generally unwell,  and is not  exercising regularly or trying to lose weight. Checking  blood sugars less than once daily at variable times, usually only if she feels she may be having a hypoglycemic event. .  BS have been under 130 fasting and < 150 post prandially.  Denies any recent hypoglyemic events.  Taking   medications as directed. Following a carbohydrate modified diet 6 days per week. Denies numbness, burning and tingling of extremities. Appetite is good.    BACK PAIN WORSE ,  CONSTANTLY TIRED/SLEEPY BUT WAKES UP EARLY.  FEELS TOO DRAINED TO DO MUCH.  SHORT TERM MEMORY GETTING WORSE  PER DAUGHTER .  LIVING ALONE.  Forgets directions to places she has gone to for years.    Still smoking .  Has aortic atherosclerosis and 2 vessel coronary atherosclerosis by lung CT.   Outpatient Medications Prior to Visit  Medication Sig Dispense Refill   Accu-Chek Softclix Lancets lancets Use to check sugar once daily. Dx: E11.9 100 each 3   aspirin EC 81 MG tablet Take 1 tablet (81 mg total) by mouth daily. 90 tablet 1   atorvastatin (LIPITOR) 40 MG tablet TAKE 1 TABLET BY MOUTH EVERY DAY 90 tablet 1   blood glucose meter kit and supplies Dispense based on patient and insurance preference. Use to check sugar once daily as directed. (FOR ICD-10 E10.9, E11.9). 1 each 0   levothyroxine (SYNTHROID) 88 MCG tablet TAKE 1 TABLET (88 MCG TOTAL) BY MOUTH  DAILY BEFORE BREAKFAST. 90 tablet 3   losartan (COZAAR) 100 MG tablet TAKE 1 TABLET BY MOUTH EVERYDAY AT BEDTIME 90 tablet 3   metFORMIN (GLUCOPHAGE-XR) 500 MG 24 hr tablet TAKE 1 TABLET BY MOUTH EVERY DAY WITH BREAKFAST 90 tablet 1   Multiple Vitamin (MULTIVITAMIN) tablet Take 1 tablet by mouth daily.     nitroGLYCERIN (NITROSTAT) 0.4 MG SL tablet Place under the tongue.     omeprazole (PRILOSEC) 40 MG capsule TAKE 1 CAPSULE BY MOUTH EVERY DAY 90 capsule 1   ONETOUCH VERIO  test strip USE TO CHECK SUGAR ONCE DAILY E11.9 (VERIO PER PT) 100 strip 3   rOPINIRole (REQUIP) 0.25 MG tablet TAKE 1 TABLET (0.25 MG TOTAL) BY MOUTH AT BEDTIME. MAY INCREASE WEEKLY AS NEEDED FOR RLS     traZODone (DESYREL) 100 MG tablet Take 100 mg by mouth at bedtime.     venlafaxine XR (EFFEXOR-XR) 150 MG 24 hr capsule TAKE 1 CAPSULE BY MOUTH EVERY DAY 90 capsule 1   hydrochlorothiazide (HYDRODIURIL) 25 MG tablet Take 25 mg by mouth daily. (Patient not taking: Reported on 01/28/2021)     No facility-administered medications prior to visit.    Review of Systems;  Patient denies  fevers, malaise, unintentional weight loss, skin rash, eye pain, sinus congestion and sinus pain, sore throat, dysphagia,  hemoptysis , cough, dyspnea, wheezing, chest pain, palpitations, orthopnea, edema, abdominal pain, nausea, melena, diarrhea, constipation, flank pain, dysuria, hematuria, urinary  Frequency, nocturia, numbness, tingling, seizures,  Focal weakness, Loss of consciousness,  Tremor, insomnia, depression, anxiety, and suicidal ideation.      Objective:  BP 118/68 (BP Location: Left Arm, Patient Position: Sitting, Cuff Size: Normal)   Pulse 87   Temp (!) 96.9 F (36.1 C) (Temporal)   Resp 15   Ht '5\' 5"'  (1.651 m)   Wt 184 lb 3.2 oz (83.6 kg)   SpO2 94%   BMI 30.65 kg/m   BP Readings from Last 3 Encounters:  01/28/21 118/68  10/06/20 134/74  07/08/20 130/70    Wt Readings from Last 3 Encounters:  01/28/21 184 lb 3.2 oz (83.6 kg)  10/06/20 177 lb 6.4 oz (80.5 kg)  07/08/20 170 lb 12.8 oz (77.5 kg)    General appearance: alert, cooperative and appears stated age Ears: normal TM's and external ear canals both ears Throat: lips, mucosa, and tongue normal; teeth and gums normal Neck: no adenopathy, no carotid bruit, supple, symmetrical, trachea midline and thyroid not enlarged, symmetric, no tenderness/mass/nodules Back: symmetric, no curvature. ROM normal. No CVA tenderness. Lungs: clear  to auscultation bilaterally Back:   no spinal tenderness  Heart: regular rate and rhythm, S1, S2 normal, no murmur, click, rub or gallop Abdomen: soft, non-tender; bowel sounds normal; no masses,  no organomegaly Pulses: 2+ and symmetric Skin: Skin color, texture, turgor normal. No rashes or lesions Lymph nodes: Cervical, supraclavicular, and axillary nodes normal.  Lab Results  Component Value Date   HGBA1C 6.4 01/28/2021   HGBA1C 5.8 (A) 07/08/2020   HGBA1C 6.0 04/08/2020    Lab Results  Component Value Date   CREATININE 1.07 01/28/2021   CREATININE 1.06 (H) 06/08/2020   CREATININE 0.98 04/08/2020    Lab Results  Component Value Date   WBC 7.2 01/28/2021   HGB 12.5 01/28/2021   HCT 36.4 01/28/2021   PLT 226.0 01/28/2021   GLUCOSE 116 (H) 01/28/2021   CHOL 147 01/28/2021   TRIG 262.0 (H) 01/28/2021   HDL 52.50 01/28/2021   LDLDIRECT 66.0  01/28/2021   LDLCALC 47 04/08/2020   ALT 30 01/28/2021   AST 25 01/28/2021   NA 140 01/28/2021   K 4.4 01/28/2021   CL 101 01/28/2021   CREATININE 1.07 01/28/2021   BUN 17 01/28/2021   CO2 29 01/28/2021   TSH 1.65 01/28/2021   HGBA1C 6.4 01/28/2021   MICROALBUR <0.7 01/28/2021    CT CHEST LUNG CANCER SCREENING LOW DOSE WO CONTRAST  Result Date: 06/25/2020 CLINICAL DATA:  75 year old female current smoker with 30 pack-year history of smoking. Lung cancer screening examination. EXAM: CT CHEST WITHOUT CONTRAST LOW-DOSE FOR LUNG CANCER SCREENING TECHNIQUE: Multidetector CT imaging of the chest was performed following the standard protocol without IV contrast. COMPARISON:  Low-dose lung cancer screening chest CT 06/12/2019. FINDINGS: Cardiovascular: Heart size is normal. There is no significant pericardial fluid, thickening or pericardial calcification. There is aortic atherosclerosis, as well as atherosclerosis of the great vessels of the mediastinum and the coronary arteries, including calcified atherosclerotic plaque in the left main,  left anterior descending and right coronary arteries. Mediastinum/Nodes: No pathologically enlarged mediastinal or hilar lymph nodes. Please note that accurate exclusion of hilar adenopathy is limited on noncontrast CT scans. Esophagus is unremarkable in appearance. No axillary lymphadenopathy. Lungs/Pleura: Several small pulmonary nodules are noted in the lungs, largest of which is in the medial segment of the right middle lobe (axial image 247 of series 3), with a volume derived mean diameter of 2.6 mm. No larger more suspicious appearing pulmonary nodules or masses are noted. No acute consolidative airspace disease. No pleural effusions. Diffuse bronchial wall thickening with mild centrilobular and paraseptal emphysema. Bilateral apical pleuroparenchymal thickening and architectural distortion, most compatible with areas of chronic post infectious or inflammatory scarring. Upper Abdomen: Aortic atherosclerosis. Musculoskeletal: There are no aggressive appearing lytic or blastic lesions noted in the visualized portions of the skeleton. Bilateral breast implants are incidentally noted. IMPRESSION: 1. Lung-RADS 2S, benign appearance or behavior. Continue annual screening with low-dose chest CT without contrast in 12 months. 2. The "S" modifier above refers to potentially clinically significant non lung cancer related findings. Specifically, there is aortic atherosclerosis, in addition to left main and 2 vessel coronary artery disease. Assessment for potential risk factor modification, dietary therapy or pharmacologic therapy may be warranted, if clinically indicated. 3. Mild diffuse bronchial wall thickening with mild centrilobular and paraseptal emphysema; imaging findings suggestive of underlying COPD. Aortic Atherosclerosis (ICD10-I70.0) and Emphysema (ICD10-J43.9). Electronically Signed   By: Vinnie Langton M.D.   On: 06/25/2020 08:14    Assessment & Plan:   Problem List Items Addressed This Visit        Unprioritized   Tobacco abuse   Relevant Orders   AMB Referral to Community Care Coordinaton   Hypothyroidism - Primary   Relevant Orders   TSH (Completed)   Arthritis   Coronary artery disease due to calcified coronary lesion   Hyperlipidemia associated with type 2 diabetes mellitus (HCC)   Relevant Orders   Hemoglobin A1c (Completed)   Microalbumin / creatinine urine ratio (Completed)   Lipid panel (Completed)   AMB Referral to Community Care Coordinaton   Aortic atherosclerosis [facility])    Reviewed findings of prior CT scan today..  Patient is already  tolerating high potency statin therapy        Relevant Orders   AMB Referral to Community Care Coordinaton   Type 2 diabetes mellitus with diabetic chronic kidney disease ([place])    Managed with metformin, ASA , ARB and atorvastatin.  Given evidence of 2 vessel disease, will consider addition of SGLT 2 transporter at next visit.  Referral to nephrology  Lab Results  Component Value Date   HGBA1C 6.4 01/28/2021   Lab Results  Component Value Date   MICROALBUR <0.7 01/28/2021   MICROALBUR 0.7 04/08/2020     Lab Results  Component Value Date   CREATININE 1.07 01/28/2021         Major depressive disorder, recurrent episode with melancholic features ([place])    Adding wellbutrin,  Reducing dose of effexor, and adding trazodone for insomnia.  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 Last vitamin B12 and Folate Lab Results  Component Value Date   VITAMINB12 882 01/28/2021   FOLATE 23.8 01/28/2021        Headache, new daily persistent ([facility])    Likely due to cervical  spondylosis and insomnia. but given her ongoing tobacco abuse AND atherosclerosis and breast cancer,  We will need  to ruleout ischemia and metastatic breast CA       Relevant Medications   traZODone (DESYREL) 100 MG tablet   venlafaxine XR (EFFEXOR-XR) 75 MG 24 hr capsule   buPROPion (WELLBUTRIN XL) 150 MG 24 hr tablet   Counseling on substance use and abuse    Smoking cessation instruction/counseling given       Other Visit Diagnoses     Decreased GFR       Relevant Orders   Comprehensive metabolic panel (Completed)   Dyspnea on exertion       Relevant Orders   Ambulatory referral to Cardiology   Fatigue, unspecified type       Relevant Orders   CBC with Differential/Platelet (Completed)   B12 deficiency       Relevant Orders   B12 and Folate Panel (Completed)     A total of 40 minutes was spent with patient more than half of which was spent in counseling patient on her depression, tobacco abuse, , reviewing and explaining recent labs and imaging studies done, and coordination of care.   I have changed Crystal Meyers's traZODone and venlafaxine XR. I am also having her start on buPROPion. Additionally, I am having her maintain her nitroGLYCERIN, rOPINIRole, aspirin EC, blood glucose meter kit and supplies, multivitamin, Accu-Chek Softclix Lancets, metFORMIN, hydrochlorothiazide, levothyroxine, OneTouch Verio, omeprazole, atorvastatin, and losartan.  Meds ordered this encounter  Medications   traZODone (DESYREL) 100 MG tablet    Sig: Take 1.5 tablets (150 mg total) by mouth at bedtime.    Dispense:  45 tablet    Refill:  5   venlafaxine XR (EFFEXOR-XR) 75 MG 24 hr capsule    Sig: Take 1 capsule (75 mg total) by mouth daily.    Dispense:  30 capsule    Refill:  5   buPROPion (WELLBUTRIN XL) 150 MG 24 hr tablet    Sig: Take 1 tablet (150 mg total) by mouth daily after breakfast.    Dispense:  30 tablet    Refill:  2    Medications Discontinued During This Encounter   Medication Reason  traZODone (DESYREL) 100 MG tablet    venlafaxine XR (EFFEXOR-XR) 150 MG 24 hr capsule     Follow-up: Return in about 4 weeks (around 02/25/2021).   [name], MD

## 2021-01-28 NOTE — Patient Instructions (Signed)
Add melatonin 5 mg at dinner time  Adding wellbutrin 150 mg in the morning with breakfast  Reduce effexor to 75 mg daily   Increase trazodone to 150 mg at bedtime after one week if melatonin is not helping you fall asleep earlier  Referring to Dr Ubaldo Glassing  Return in one month

## 2021-01-31 ENCOUNTER — Encounter: Payer: Self-pay | Admitting: Internal Medicine

## 2021-01-31 DIAGNOSIS — F339 Major depressive disorder, recurrent, unspecified: Secondary | ICD-10-CM | POA: Insufficient documentation

## 2021-01-31 DIAGNOSIS — R4189 Other symptoms and signs involving cognitive functions and awareness: Secondary | ICD-10-CM | POA: Insufficient documentation

## 2021-01-31 DIAGNOSIS — G4452 New daily persistent headache (NDPH): Secondary | ICD-10-CM | POA: Insufficient documentation

## 2021-01-31 DIAGNOSIS — Z7189 Other specified counseling: Secondary | ICD-10-CM | POA: Insufficient documentation

## 2021-01-31 DIAGNOSIS — E1122 Type 2 diabetes mellitus with diabetic chronic kidney disease: Secondary | ICD-10-CM | POA: Insufficient documentation

## 2021-01-31 NOTE — Assessment & Plan Note (Addendum)
Adding wellbutrin,  Reducing dose of effexor, and adding trazodone for insomnia.  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

## 2021-01-31 NOTE — Assessment & Plan Note (Signed)
Likely due to depression  Given normal screen  Lab Results  Component Value Date   TSH 1.65 01/28/2021   . Last vitamin B12 and Folate Lab Results  Component Value Date   VITAMINB12 882 01/28/2021   FOLATE 23.8 01/28/2021

## 2021-01-31 NOTE — Assessment & Plan Note (Signed)
Smoking cessation instruction/counseling given

## 2021-01-31 NOTE — Assessment & Plan Note (Signed)
Reviewed findings of prior CT scan today..  Patient is already  tolerating high potency statin therapy

## 2021-01-31 NOTE — Assessment & Plan Note (Addendum)
Likely due to cervical spondylosis and insomnia. but given her ongoing tobacco abuse AND atherosclerosis and breast cancer,  We will need  to ruleout ischemia and metastatic breast CA

## 2021-01-31 NOTE — Assessment & Plan Note (Addendum)
Managed with metformin, ASA , ARB and atorvastatin.  Given evidence of 2 vessel disease, will consider addition of SGLT 2 transporter at next visit.  Referral to nephrology  Lab Results  Component Value Date   HGBA1C 6.4 01/28/2021   Lab Results  Component Value Date   MICROALBUR <0.7 01/28/2021   MICROALBUR 0.7 04/08/2020     Lab Results  Component Value Date   CREATININE 1.07 01/28/2021

## 2021-02-01 ENCOUNTER — Telehealth: Payer: Self-pay

## 2021-02-01 NOTE — Chronic Care Management (AMB) (Signed)
  Chronic Care Management   Note  02/01/2021 Name: MISA FEDORKO MRN: [id number] DOB: 07-07-1946  [name] is a 74 y.o. year old female who is a primary care patient of Derrel Nip, Aris Everts, MD. I reached out to [name] by phone today in response to a referral sent by Ms. Amado Nash Hoban's PCP, [name], MD      Ms. Farrelly was given information about Chronic Care Management services today including:  CCM service includes personalized support from designated clinical staff supervised by her physician, including individualized plan of care and coordination with other care providers 24/7 contact phone numbers for assistance for urgent and routine care needs. Service will only be billed when office clinical staff spend 20 minutes or more in a month to coordinate care. Only one practitioner may furnish and bill the service in a calendar month. The patient may stop CCM services at any time (effective at the end of the month) by phone call to the office staff. The patient will be responsible for cost sharing (co-pay) of up to 20% of the service fee (after annual deductible is met).  Patient agreed to services and verbal consent obtained.   Follow up plan: Telephone appointment with care management team member scheduled for:02/17/2021  Noreene Larsson, [address], [place], [place] [address] Direct Dial: [phone] [email or web address] [email or web address]: [email or web address]

## 2021-02-11 ENCOUNTER — Other Ambulatory Visit: Payer: Self-pay | Admitting: Internal Medicine

## 2021-02-17 ENCOUNTER — Ambulatory Visit (INDEPENDENT_AMBULATORY_CARE_PROVIDER_SITE_OTHER): Payer: Medicare Other | Admitting: Pharmacist

## 2021-02-17 DIAGNOSIS — F339 Major depressive disorder, recurrent, unspecified: Secondary | ICD-10-CM | POA: Diagnosis not present

## 2021-02-17 DIAGNOSIS — E785 Hyperlipidemia, unspecified: Secondary | ICD-10-CM | POA: Diagnosis not present

## 2021-02-17 DIAGNOSIS — E1122 Type 2 diabetes mellitus with diabetic chronic kidney disease: Secondary | ICD-10-CM | POA: Diagnosis not present

## 2021-02-17 DIAGNOSIS — F17200 Nicotine dependence, unspecified, uncomplicated: Secondary | ICD-10-CM | POA: Diagnosis not present

## 2021-02-17 DIAGNOSIS — E039 Hypothyroidism, unspecified: Secondary | ICD-10-CM

## 2021-02-17 DIAGNOSIS — N182 Chronic kidney disease, stage 2 (mild): Secondary | ICD-10-CM | POA: Diagnosis not present

## 2021-02-17 DIAGNOSIS — E1169 Type 2 diabetes mellitus with other specified complication: Secondary | ICD-10-CM

## 2021-02-17 DIAGNOSIS — R0602 Shortness of breath: Secondary | ICD-10-CM | POA: Diagnosis not present

## 2021-02-17 DIAGNOSIS — I2584 Coronary atherosclerosis due to calcified coronary lesion: Secondary | ICD-10-CM

## 2021-02-17 DIAGNOSIS — R0609 Other forms of dyspnea: Secondary | ICD-10-CM | POA: Diagnosis not present

## 2021-02-17 DIAGNOSIS — J410 Simple chronic bronchitis: Secondary | ICD-10-CM | POA: Diagnosis not present

## 2021-02-17 DIAGNOSIS — Z720 Tobacco use: Secondary | ICD-10-CM

## 2021-02-17 DIAGNOSIS — F17210 Nicotine dependence, cigarettes, uncomplicated: Secondary | ICD-10-CM | POA: Diagnosis not present

## 2021-02-17 DIAGNOSIS — I2510 Atherosclerotic heart disease of native coronary artery without angina pectoris: Secondary | ICD-10-CM

## 2021-02-17 DIAGNOSIS — G4733 Obstructive sleep apnea (adult) (pediatric): Secondary | ICD-10-CM | POA: Diagnosis not present

## 2021-02-17 MED ORDER — DAPAGLIFLOZIN PROPANEDIOL 10 MG PO TABS: 10.0000 mg | ORAL_TABLET | Freq: Every day | ORAL | 0 refills | Status: DC

## 2021-02-17 NOTE — Chronic Care Management (AMB) (Signed)
Chronic Care Management Pharmacy Note  02/17/2021 Name:  Crystal Meyers MRN:  [id number] DOB:  19-Nov-1945   Subjective: Crystal Meyers is an 75 y.o. year old female who is a primary patient of Tullo, Aris Everts, MD.  The CCM team was consulted for assistance with disease management and care coordination needs.    Engaged with patient by telephone for initial visit in response to provider referral for pharmacy case management and/or care coordination services.   Consent to Services:  The patient was given the following information about Chronic Care Management services today, agreed to services, and gave verbal consent: 1. CCM service includes personalized support from designated clinical staff supervised by the primary care provider, including individualized plan of care and coordination with other care providers 2. 24/7 contact phone numbers for assistance for urgent and routine care needs. 3. Service will only be billed when office clinical staff spend 20 minutes or more in a month to coordinate care. 4. Only one practitioner may furnish and bill the service in a calendar month. 5.The patient may stop CCM services at any time (effective at the end of the month) by phone call to the office staff. 6. The patient will be responsible for cost sharing (co-pay) of up to 20% of the service fee (after annual deductible is met). Patient agreed to services and consent obtained.  Patient Care Team: [name], MD as PCP - General (Internal Medicine) [name], MD (Unknown Physician Specialty) [name], RPH-CPP (Pharmacist)  Recent office visits: 7/21 - PCP- adding bupropion, reduced venlafaxine, increasedtrazodone for sleep/depression; A1c 6.4%, however, CAD/CKD. LDL 66.   Objective:  Lab Results  Component Value Date   CREATININE 1.07 01/28/2021   CREATININE 1.06 (H) 06/08/2020   CREATININE 0.98 04/08/2020    Lab Results  Component Value Date   HGBA1C 6.4 01/28/2021    Last diabetic Eye exam:  Lab Results  Component Value Date/Time   HMDIABEYEEXA No Retinopathy 05/08/2020 12:00 AM    Last diabetic Foot exam: No results found for: HMDIABFOOTEX      Component Value Date/Time   CHOL 147 01/28/2021 1318   TRIG 262.0 (H) 01/28/2021 1318   HDL 52.50 01/28/2021 1318   CHOLHDL 3 01/28/2021 1318   VLDL 52.4 (H) 01/28/2021 1318   LDLCALC 47 04/08/2020 1051   LDLDIRECT 66.0 01/28/2021 1318    Hepatic Function Latest Ref Rng & Units 01/28/2021 06/08/2020 04/08/2020  Total Protein 6.0 - 8.3 g/dL 6.5 7.4 6.5  Albumin 3.5 - 5.2 g/dL 4.1 4.1 4.2  AST 0 - 37 U/L _0 ALT 0 - 35 U/L _1 Alk Phosphatase 39 - 117 U/L 43 40 41  Total Bilirubin 0.2 - 1.2 mg/dL 0.3 0.4 0.4    Lab Results  Component Value Date/Time   TSH 1.65 01/28/2021 01:18 PM   TSH 2.41 04/08/2020 10:51 AM    CBC Latest Ref Rng & Units 01/28/2021 06/08/2020 04/15/2019  WBC 4.0 - 10.5 K/uL 7.2 7.6 7.9  Hemoglobin 12.0 - 15.0 g/dL 12.5 13.9 13.2  Hematocrit 36.0 - 46.0 % 36.4 41.4 38.9  Platelets 150.0 - 400.0 K/uL 226.0 232 274    Lab Results  Component Value Date/Time   VD25OH 36.98 12/06/2016 02:11 PM   VD25OH 29.29 (L) 08/31/2016 10:54 AM    Clinical ASCVD: Yes  The 10-year ASCVD risk score Mikey Bussing DC Jr., et al., 2013) is: 33.6%   Values used to calculate the score:  Age: 61 years     Sex: Female     Is Non-Hispanic African American: No     Diabetic: Yes     Tobacco smoker: Yes     Systolic Blood Pressure: 737 mmHg     Is BP treated: Yes     HDL Cholesterol: 52.5 mg/dL     Total Cholesterol: 147 mg/dL     Social History   Tobacco Use  Smoking Status Every Day   Packs/day: 1.00   Years: 54.00   Pack years: 54.00   Types: Cigarettes  Smokeless Tobacco Never  Tobacco Comments   Refused cessation material   BP Readings from Last 3 Encounters:  01/28/21 118/68  10/06/20 134/74  07/08/20 130/70   Pulse Readings from Last 3 Encounters:  01/28/21 87   10/06/20 85  07/08/20 90   Wt Readings from Last 3 Encounters:  01/28/21 184 lb 3.2 oz (83.6 kg)  10/06/20 177 lb 6.4 oz (80.5 kg)  07/08/20 170 lb 12.8 oz (77.5 kg)    Assessment: Review of patient past medical history, allergies, medications, health status, including review of consultants reports, laboratory and other test data, was performed as part of comprehensive evaluation and provision of chronic care management services.   SDOH:  (Social Determinants of Health) assessments and interventions performed:  SDOH Interventions    Flowsheet Row Most Recent Value  SDOH Interventions   SDOH Interventions for the Following Domains Tobacco  Financial Strain Interventions Other (Comment)  [manufacturer assistance]  Tobacco Interventions Cessation Materials Given and Reviewed       CCM Care Plan  Allergies  Allergen Reactions   Lamisil [Terbinafine] Rash   No Known Allergies Rash    Medications Reviewed Today     Reviewed by [name], RPH-CPP (Pharmacist) on 02/17/21 at 1024  Med List Status: <None>   Medication Order Taking? Sig Documenting Provider Last Dose Status Informant  Accu-Chek Softclix Lancets lancets [id number] Yes Use to check sugar once daily. Dx: E11.9 [name], MD Taking Active   aspirin EC 81 MG tablet [id number] Yes Take 1 tablet (81 mg total) by mouth daily. [name], MD Taking Active   atorvastatin (LIPITOR) 40 MG tablet [id number] Yes TAKE 1 TABLET BY MOUTH EVERY DAY [name], MD Taking Active   blood glucose meter kit and supplies [id number] Yes Dispense based on patient and insurance preference. Use to check sugar once daily as directed. (FOR ICD-10 E10.9, E11.9). [name], MD Taking Active   buPROPion (WELLBUTRIN XL) 150 MG 24 hr tablet [id number] No TAKE 1 TABLET (150 MG TOTAL) BY MOUTH DAILY AFTER BREAKFAST.  Patient not taking: Reported on 02/17/2021   [name], MD Not Taking Active   hydrochlorothiazide  (HYDRODIURIL) 25 MG tablet [id number]  Take 25 mg by mouth daily.  Patient not taking: Reported on 01/28/2021   [provider]  Active   levothyroxine (SYNTHROID) 88 MCG tablet [id number] Yes TAKE 1 TABLET (88 MCG TOTAL) BY MOUTH DAILY BEFORE BREAKFAST. [name], MD Taking Active   losartan (COZAAR) 100 MG tablet [id number] Yes TAKE 1 TABLET BY MOUTH EVERYDAY AT BEDTIME [name], MD Taking Active   metFORMIN (GLUCOPHAGE-XR) 500 MG 24 hr tablet [id number] Yes TAKE 1 TABLET BY MOUTH EVERY DAY WITH BREAKFAST [name], MD Taking Active   Multiple Vitamin (MULTIVITAMIN) tablet [id number] Yes Take 1 tablet by mouth daily. [provider] Taking Active   nitroGLYCERIN (NITROSTAT) 0.4 MG SL  tablet [id number] Yes Place under the tongue. [provider] Taking Active   omeprazole (PRILOSEC) 40 MG capsule [id number] Yes TAKE 1 CAPSULE BY MOUTH EVERY DAY [name], MD Taking Active   [facility] VERIO test strip [id number] Yes USE TO CHECK SUGAR ONCE DAILY E11.9 (VERIO PER PT) [name], MD Taking Active   rOPINIRole (REQUIP) 0.25 MG tablet [id number] No TAKE 1 TABLET (0.25 MG TOTAL) BY MOUTH AT BEDTIME. MAY INCREASE WEEKLY AS NEEDED FOR RLS  Patient not taking: Reported on 02/17/2021   [provider] Not Taking Active   traZODone (DESYREL) 100 MG tablet [id number] Yes Take 1.5 tablets (150 mg total) by mouth at bedtime. [name], MD Taking Active   venlafaxine XR (EFFEXOR-XR) 75 MG 24 hr capsule [id number] Yes Take 1 capsule (75 mg total) by mouth daily. [name], MD Taking Active            Med Note Darnelle Maffucci, Arville Lime   Wed Feb 17, 2021 10:21 AM) Still taking 150 mg daily             Patient Active Problem List   Diagnosis Date Noted   Type 2 diabetes mellitus with diabetic chronic kidney disease ([place]) 01/31/2021   Major depressive disorder, recurrent episode with melancholic features ([place]) 03/49/6116   Cognitive changes  01/31/2021   Headache, new daily persistent ([facility]) 01/31/2021   Counseling on substance use and abuse 01/31/2021   Aortic atherosclerosis ([place]) 07/10/2020   Tubular adenoma of colon 04/21/2020   Family history of colon cancer requiring screening colonoscopy 04/09/2020   Ingrown nail of fifth toe of left foot 09/02/2019   Hyperlipidemia associated with type 2 diabetes mellitus ([place]) 12/25/2018   History of DVT (deep vein thrombosis) 03/22/2018   Leg edema, left 03/18/2018   Restless legs syndrome 09/14/2017   Extremity atherosclerosis with intermittent claudication ([place]) 03/09/2017   Vasomotor flushing 03/09/2017   Chronic venous insufficiency 12/29/2016   Lymphedema 12/29/2016   History of breast cancer in female 04/14/2016   Trigeminal neuralgia of right side of face 01/21/2016   HNP (herniated nucleus pulposus), lumbar 08/11/2015   Sciatica neuralgia 07/06/2015   Coronary artery disease due to calcified coronary lesion 06/12/2015   Essential hypertension 06/12/2015   Edema 06/12/2015   Personal history of tobacco use, presenting hazards to health 05/06/2015   Arthritis 04/05/2015   CAFL (chronic airflow limitation) ([place]) 04/05/2015   Hypothyroidism 02/10/2015   Multiple pulmonary nodules 10/15/2014   Generalized anxiety disorder 05/18/2014   Nicotine addiction 02/12/2014   Neuropathy 11/28/2013   Overweight (BMI 25.0-29.9) 08/28/2012   Routine general medical examination at a health care facility 08/27/2012   Barrett's esophagus 08/27/2012   OSA on CPAP 10/14/2011   COPD (chronic obstructive pulmonary disease) with emphysema ([place]) 07/27/2011   Tobacco abuse 07/27/2011   Tobacco abuse counseling 07/27/2011   Depression 07/27/2011   Current tobacco use 07/27/2011    Immunization History  Administered Date(s) Administered   Fluad Quad(high Dose 65+) 04/10/2019, 04/08/2020   Influenza, High Dose Seasonal PF 06/09/2017, 03/15/2018   Influenza,inj,Quad PF,6+ Mos 09/05/2016    PFIZER(Purple Top)SARS-COV-2 Vaccination 02/19/2020, 03/13/2020   Pneumococcal Conjugate-13 11/25/2013   Pneumococcal Polysaccharide-23 11/26/2006, 07/03/2015   Tdap 04/13/2018    Conditions to be addressed/monitored: CAD, HTN, HLD, DMII, and CKD  Care Plan : Medication Management  Updates made by [name], RPH-CPP since 02/17/2021 12:00 AM     Problem: Diabetes, CAD, CKD  Long-Range Goal: Disease Progression Prevention   Start Date: 02/17/2021  This Visit's Progress: On track  Priority: High  Note:   Current Barriers:  Unable to independently afford treatment regimen Suboptimal therapeutic regimen for cardiovascular risk reduction  Pharmacist Clinical Goal(s):  Over the next 90 days, patient will verbalize ability to afford treatment regimen through collaboration with PharmD and provider.    Interventions: 1:1 collaboration with [name], MD regarding development and update of comprehensive plan of care as evidenced by provider attestation and co-signature Inter-disciplinary care team collaboration (see longitudinal plan of care) Comprehensive medication review performed; medication list updated in electronic medical record  SDOH: Daughter and son in law living with her, daughter fell and broke hip.  Receives SSI + husband's pension. Over income for Extra Help.   Health Maintenance: Will discuss COVID booster moving forward.   Diabetes: Controlled; current treatment: metformin XR 500 mg daily;  Counseled on SGLT2, including mechanism of action, side effects, and benefits. Discussed potential side effects of dehydration, genitourinary infections. Encouraged adequate hydration and genital hygiene. Advised on sick day rules (if a day with significantly reduced oral intake, serious vomiting, or diarrhea, hold SGLT2). Patient verbalized understanding.  Start Farxiga 10 mg daily (CKD dosing). Patient requests to discontinue metformin. I believe this is  appropriate. If next A1c >7%, recommend adding metformin back in combination with [place] in [place]. Will provide sample for patient to start.  Discussed income. Patient meets criteria for patient assistance. Will collaborate w/ patient, provider, and CPhT for patient assistance application.   Hypertension: Controlled; current treatment: losartan 100 mg daily; is not taking HCTZ 25 mg daily  Current home readings: did not discuss today Removed HCTZ from her medication list as she has not been taking.  Recommended to continue losartan 100 mg daily at this time. Educated on benefit of RAAS agent in CKD.   Hyperlipidemia/CAD: Controlled; current treatment: atorvastatin 40 mg daily;  Antiplatelet regimen: aspirin 81 mg daily Recommended to continue current regimen at this time .  Educated on benefit of statin therapy in CAD. Appointment w/ Dr. Saralyn Pilar later this afternoon.   Depression/Insomnia/RLS: Improved per patient report; current regimen: venlafaxine 150 mg daily (has not cut down to 75 mg daily), trazodone 150 mg QPM, bupropion XR 150 mg QAM - has not been able to pick up due to cost; ropinirole 0.25 mg PRN - report she doesn't generally use Does indicate some benefit with sleep with trazodone dose increase.  Discussed cost concerns. She does not qualify for Medicare Extra Help. Requests not to start bupropion at this time. Removed from list and added venlafaxine 150 mg dose back.   Tobacco Abuse: 1 packs per day; 60 years of use; does smoke within 30 minutes of waking up Previous quit attempts: unsuccessful using bupropion (insomnia), varenicline (reports insomnia, lack of benefit); reports she has looked into nicotine replacement therapy but cost was too expensive; does report she was able to quit for a year when she had breast cancer. Triggers to smoke: waking, meals, back pain Motivation to quit smoking: unsure  On a scale of 1-10, how CONFIDENT are you that you can quit smoking: 1   Very unmotivated, does not feel like she would be successful. Discussed setting a goal of at least cutting back on how much she is smoking, for both health and financial benefit. Discussed benefit of dual NRT w/ patch and gum/lozenge. Patient would not be able to afford OTC, and Medicare does not cover. Provided information  about [place] Quit Line and encouraged patient to call.   Hypothyroidism: Controlled per last lab work; current regimen: levothyroxine 88 mcg daily Confirmed appropriate patient administration.  Continue current regimen at this time  GERD/Barrett's Esophagus: Controlled; current regimen: omeprazole 40 mg daily Reports she read that PPIs are bad for your kidneys. Tried to stop omeprazole for a few days, but had rebound acid reflux.  Explained need for daily PPI therapy w/ Barrett's esophagus. Educated that impact on renal function of PPI is unlikely, more likely that DM, HTN impacted her renal function. Advised to continue omeprazole 40 mg daily as prescribed.   Patient Goals/Self-Care Activities Over the next 90 days, patient will:  - take medications as prescribed check blood pressure periodically, document, and provide at future appointments collaborate with provider on medication access solutions  Follow Up Plan: Telephone follow up appointment with care management team member scheduled for: ~ 4 weeks      Medication Assistance:  Farxiga assistance in progress  Patient's preferred pharmacy is:  [id number] [address]: [phone]: [facility] [place]Phone: [phone]: [phone]  Follow Up:  Patient agrees to Care Plan and Follow-up.  Plan: Telephone follow up appointment with care management team member scheduled for:  ~ 4  weeks  Catie TDarnelle Maffucci PharmD, [place] CPP Clinical Pharmacist [phone] Medication Samples have been provided to the patient.  Drug name: FWilder Glade      Strength: 10 mg        Qty: 4  LOT: [id number] Exp.Date: 05/11/23

## 2021-02-17 NOTE — Patient Instructions (Addendum)
Crystal Meyers,   It was great taking with you today!   START Farxiga 10 mg daily. Take this medication in the morning, as it can cause more frequent urination with more sugar in the urine. It may worsen risk for dehydration or genital infections. Focus on staying well hydrated and using good genital hygiene. Stop the medication and call our office if you develop any symptoms of genital infections, such as burning, itching, or pain while urinating or itching with redness that could be a yeast infection. If you have a day that you are vomiting or having diarrhea and you are very dehydrated, please hold this medication until you feel better.   You can stop metformin for now. Please check your blood sugars periodically, alternating between:  1) Fasting, first thing in the morning before breakfast and  2) 2 hours after your largest meal.   For a goal A1c of less than 7%, goal fasting readings are less than 130 and goal 2 hour after meal readings are less than 180.   My pharmacy technician, Sharee Pimple, will be in touch about the [place] patient assistance.   Continue to take losartan every day for your blood pressure and kidney protection. Please check your blood pressure periodically at home. Our goal is less than 130/80.   Continue to take atorvastatin every day for cholesterol and reducing your risk of a heart attack or stroke.   Call the 1-800-QUIT-NOW [place] Quit line for some support in quitting (or reducing how much you are) smoking.   Call me with any questions or concerns!  [name], PharmD [phone]  Visit Information   PATIENT GOALS:   Goals Addressed               This Visit's Progress     Patient Stated     Medication Monitoring (pt-stated)        Patient Goals/Self-Care Activities Over the next 90 days, patient will:  - take medications as prescribed check blood pressure periodically, document, and provide at future appointments collaborate with provider on medication  access solutions        Consent to CCM Services: Crystal Meyers was given information about Chronic Care Management services including:  CCM service includes personalized support from designated clinical staff supervised by her physician, including individualized plan of care and coordination with other care providers 24/7 contact phone numbers for assistance for urgent and routine care needs. Service will only be billed when office clinical staff spend 20 minutes or more in a month to coordinate care. Only one practitioner may furnish and bill the service in a calendar month. The patient may stop CCM services at any time (effective at the end of the month) by phone call to the office staff. The patient will be responsible for cost sharing (co-pay) of up to 20% of the service fee (after annual deductible is met).  Patient agreed to services and verbal consent obtained.   Print copy of patient instructions, educational materials, and care plan provided in person.  Plan: Telephone follow up appointment with care management team member scheduled for:  ~ 4 weeks  [name], PharmD, [place], CPP Clinical Pharmacist [place] [facility] [place]: Patient Care Plan: Medication Management     Problem Identified: Diabetes, CAD, CKD      Long-Range Goal: Disease Progression Prevention   Start Date: 02/17/2021  This Visit's Progress: On track  Priority: High  Note:   Current Barriers:  Unable to  independently afford treatment regimen Suboptimal therapeutic regimen for cardiovascular risk reduction  Pharmacist Clinical Goal(s):  Over the next 90 days, patient will verbalize ability to afford treatment regimen through collaboration with PharmD and provider.    Interventions: 1:1 collaboration with [name], MD regarding development and update of comprehensive plan of care as evidenced by provider attestation and  co-signature Inter-disciplinary care team collaboration (see longitudinal plan of care) Comprehensive medication review performed; medication list updated in electronic medical record  SDOH: Daughter and son in law living with her, daughter fell and broke hip.  Receives SSI + husband's pension. Over income for Extra Help.   Health Maintenance: Will discuss COVID booster moving forward.   Diabetes: Controlled; current treatment: metformin XR 500 mg daily;  Counseled on SGLT2, including mechanism of action, side effects, and benefits. Discussed potential side effects of dehydration, genitourinary infections. Encouraged adequate hydration and genital hygiene. Advised on sick day rules (if a day with significantly reduced oral intake, serious vomiting, or diarrhea, hold SGLT2). Patient verbalized understanding.  Start Farxiga 10 mg daily (CKD dosing). Patient requests to discontinue metformin. I believe this is appropriate. If next A1c >7%, recommend adding metformin back in combination with [place] in [place]. Will provide sample for patient to start.  Discussed income. Patient meets criteria for patient assistance. Will collaborate w/ patient, provider, and CPhT for patient assistance application.   Hypertension: Controlled; current treatment: losartan 100 mg daily; is not taking HCTZ 25 mg daily  Current home readings: did not discuss today Removed HCTZ from her medication list as she has not been taking.  Recommended to continue losartan 100 mg daily at this time. Educated on benefit of RAAS agent in CKD.   Hyperlipidemia/CAD: Controlled; current treatment: atorvastatin 40 mg daily;  Antiplatelet regimen: aspirin 81 mg daily Recommended to continue current regimen at this time .  Educated on benefit of statin therapy in CAD. Appointment w/ Dr. Saralyn Pilar later this afternoon.   Depression/Insomnia/RLS: Improved per patient report; current regimen: venlafaxine 150 mg daily (has not cut  down to 75 mg daily), trazodone 150 mg QPM, bupropion XR 150 mg QAM - has not been able to pick up due to cost; ropinirole 0.25 mg PRN - report she doesn't generally use Does indicate some benefit with sleep with trazodone dose increase.  Discussed cost concerns. She does not qualify for Medicare Extra Help. Requests not to start bupropion at this time. Removed from list and added venlafaxine 150 mg dose back.   Tobacco Abuse: 1 packs per day; 60 years of use; does smoke within 30 minutes of waking up Previous quit attempts: unsuccessful using bupropion (insomnia), varenicline (reports insomnia, lack of benefit); reports she has looked into nicotine replacement therapy but cost was too expensive; does report she was able to quit for a year when she had breast cancer. Triggers to smoke: waking, meals, back pain Motivation to quit smoking: unsure  On a scale of 1-10, how CONFIDENT are you that you can quit smoking: 1  Very unmotivated, does not feel like she would be successful. Discussed setting a goal of at least cutting back on how much she is smoking, for both health and financial benefit. Discussed benefit of dual NRT w/ patch and gum/lozenge. Patient would not be able to afford OTC, and Medicare does not cover. Provided information about [place] Quit Line and encouraged patient to call.   Hypothyroidism: Controlled per last lab work; current regimen: levothyroxine 88 mcg daily Confirmed appropriate patient administration.  Continue current regimen at this time  GERD/Barrett's Esophagus: Controlled; current regimen: omeprazole 40 mg daily Reports she read that PPIs are bad for your kidneys. Tried to stop omeprazole for a few days, but had rebound acid reflux.  Explained need for daily PPI therapy w/ Barrett's esophagus. Educated that impact on renal function of PPI is unlikely, more likely that DM, HTN impacted her renal function. Advised to continue omeprazole 40 mg daily as prescribed.    Patient Goals/Self-Care Activities Over the next 90 days, patient will:  - take medications as prescribed check blood pressure periodically, document, and provide at future appointments collaborate with provider on medication access solutions  Follow Up Plan: Telephone follow up appointment with care management team member scheduled for: ~ 4 weeks

## 2021-02-18 ENCOUNTER — Telehealth: Payer: Self-pay | Admitting: Pharmacy Technician

## 2021-02-18 DIAGNOSIS — Z596 Low income: Secondary | ICD-10-CM

## 2021-02-18 NOTE — Progress Notes (Signed)
[place] [facility])                                            [place] Team    02/18/2021  KAEDE PANNIER [birth year] [id number]                                      Medication Assistance Referral  Referral From: Elizabeth Catie T.   Medication/Company: [employer] / [employer] Patient application portion:  N/A patient signed in clinic Provider application portion:  N/A provider signed in clinic  to [place] Provider address/fax verified via: Office website  Received both patient and provider portion(s) of patient assistance application(s) for [place]. Faxed completed application and required documents into [employer].   Kyna Blahnik P. Elsi Stelzer, [place]  [phone]

## 2021-02-20 ENCOUNTER — Other Ambulatory Visit: Payer: Self-pay | Admitting: Internal Medicine

## 2021-02-22 NOTE — Telephone Encounter (Signed)
Looks like rx was discontinued on 02/17/2021 due to change in therapy.

## 2021-02-25 DIAGNOSIS — R0602 Shortness of breath: Secondary | ICD-10-CM | POA: Diagnosis not present

## 2021-02-25 DIAGNOSIS — R0609 Other forms of dyspnea: Secondary | ICD-10-CM | POA: Diagnosis not present

## 2021-03-01 ENCOUNTER — Other Ambulatory Visit: Payer: Self-pay | Admitting: Internal Medicine

## 2021-03-08 DIAGNOSIS — I2510 Atherosclerotic heart disease of native coronary artery without angina pectoris: Secondary | ICD-10-CM | POA: Diagnosis not present

## 2021-03-08 DIAGNOSIS — D2261 Melanocytic nevi of right upper limb, including shoulder: Secondary | ICD-10-CM | POA: Diagnosis not present

## 2021-03-08 DIAGNOSIS — L820 Inflamed seborrheic keratosis: Secondary | ICD-10-CM | POA: Diagnosis not present

## 2021-03-08 DIAGNOSIS — L821 Other seborrheic keratosis: Secondary | ICD-10-CM | POA: Diagnosis not present

## 2021-03-08 DIAGNOSIS — F17200 Nicotine dependence, unspecified, uncomplicated: Secondary | ICD-10-CM | POA: Diagnosis not present

## 2021-03-08 DIAGNOSIS — I700 Atherosclerosis of aorta: Secondary | ICD-10-CM | POA: Diagnosis not present

## 2021-03-08 DIAGNOSIS — X32XXXA Exposure to sunlight, initial encounter: Secondary | ICD-10-CM | POA: Diagnosis not present

## 2021-03-08 DIAGNOSIS — D225 Melanocytic nevi of trunk: Secondary | ICD-10-CM | POA: Diagnosis not present

## 2021-03-08 DIAGNOSIS — G4733 Obstructive sleep apnea (adult) (pediatric): Secondary | ICD-10-CM | POA: Diagnosis not present

## 2021-03-08 DIAGNOSIS — L570 Actinic keratosis: Secondary | ICD-10-CM | POA: Diagnosis not present

## 2021-03-08 DIAGNOSIS — D2262 Melanocytic nevi of left upper limb, including shoulder: Secondary | ICD-10-CM | POA: Diagnosis not present

## 2021-03-08 DIAGNOSIS — D2272 Melanocytic nevi of left lower limb, including hip: Secondary | ICD-10-CM | POA: Diagnosis not present

## 2021-03-08 DIAGNOSIS — L538 Other specified erythematous conditions: Secondary | ICD-10-CM | POA: Diagnosis not present

## 2021-03-08 DIAGNOSIS — R0602 Shortness of breath: Secondary | ICD-10-CM | POA: Diagnosis not present

## 2021-03-08 DIAGNOSIS — D2271 Melanocytic nevi of right lower limb, including hip: Secondary | ICD-10-CM | POA: Diagnosis not present

## 2021-03-10 ENCOUNTER — Other Ambulatory Visit: Payer: Self-pay

## 2021-03-10 ENCOUNTER — Encounter: Payer: Self-pay | Admitting: Internal Medicine

## 2021-03-10 ENCOUNTER — Ambulatory Visit (INDEPENDENT_AMBULATORY_CARE_PROVIDER_SITE_OTHER): Payer: Medicare Other | Admitting: Internal Medicine

## 2021-03-10 VITALS — BP 124/70 | HR 95 | Temp 98.4°F | Ht 65.0 in | Wt 183.8 lb

## 2021-03-10 DIAGNOSIS — G4733 Obstructive sleep apnea (adult) (pediatric): Secondary | ICD-10-CM

## 2021-03-10 DIAGNOSIS — Z9989 Dependence on other enabling machines and devices: Secondary | ICD-10-CM

## 2021-03-10 DIAGNOSIS — E1122 Type 2 diabetes mellitus with diabetic chronic kidney disease: Secondary | ICD-10-CM | POA: Diagnosis not present

## 2021-03-10 DIAGNOSIS — F3341 Major depressive disorder, recurrent, in partial remission: Secondary | ICD-10-CM

## 2021-03-10 DIAGNOSIS — J431 Panlobular emphysema: Secondary | ICD-10-CM

## 2021-03-10 DIAGNOSIS — I2584 Coronary atherosclerosis due to calcified coronary lesion: Secondary | ICD-10-CM

## 2021-03-10 DIAGNOSIS — R4189 Other symptoms and signs involving cognitive functions and awareness: Secondary | ICD-10-CM

## 2021-03-10 DIAGNOSIS — G4452 New daily persistent headache (NDPH): Secondary | ICD-10-CM | POA: Diagnosis not present

## 2021-03-10 DIAGNOSIS — G500 Trigeminal neuralgia: Secondary | ICD-10-CM

## 2021-03-10 DIAGNOSIS — R519 Headache, unspecified: Secondary | ICD-10-CM

## 2021-03-10 DIAGNOSIS — N182 Chronic kidney disease, stage 2 (mild): Secondary | ICD-10-CM

## 2021-03-10 DIAGNOSIS — F411 Generalized anxiety disorder: Secondary | ICD-10-CM

## 2021-03-10 DIAGNOSIS — I2510 Atherosclerotic heart disease of native coronary artery without angina pectoris: Secondary | ICD-10-CM

## 2021-03-10 NOTE — Progress Notes (Signed)
Subjective:  Patient ID: [name], female    DOB: Nov 05, 1945  Age: 75 y.o. MRN: [id number]  CC: The primary encounter diagnosis was Type 2 diabetes mellitus with stage 2 chronic kidney disease, without long-term current use of insulin ([place]). Diagnoses of Chronic daily headache, Panlobular emphysema ([place]), Recurrent major depressive disorder, in partial remission ([place]), OSA on CPAP, Generalized anxiety disorder, Trigeminal neuralgia of right side of face, Cognitive changes, and Headache, new daily persistent (ND[facility]) were also pertinent to this visit.  HPI ADALYNE LOVICK presents for one month follow up on cognitive changes. And headaches.  Chief Complaint  Patient presents with   Follow-up    1) dementia vs pseudodementia:  short term memory changes were reported by daughter  at last visit in the setting of chronic insomnia and depression. She has not had brain MRI.  Antidepressant regimen  and insomnia medication dose was changed  one month ago but she never started wellbutrin due to cost.  Still taking effexor at the dose of 150 mg and sleeping better with higher trazodone dose   2) recurrent stabbing quality headache since last visit has resolved.  Still has temple pressure Occurring daily without nausea,  vertigo or vision changes.  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   3) feels that one of her knees is giving out unexpectedly not sure which one,  denies pain  and falls.   4) episodes of lower back pain  and /or neck shoulder pain brought on by standing up more than ten minutes at the kitchen sink , also brought on by doing housework.   History of  lumbar laminectomy in 2017.   5) DM type 2 :  now taking farxiga. Frequent urination resolved. Not checking blood sugars regularly . Denies neuropathy, hypoglycemic  episodes.   Lab Results  Component Value Date   HGBA1C 6.4 01/28/2021     Outpatient Medications Prior to Visit  Medication Sig Dispense Refill   Accu-Chek Softclix Lancets lancets Use to check sugar once daily. Dx: E11.9 100 each 3   aspirin EC 81 MG tablet Take 1 tablet (81 mg total) by mouth daily. 90 tablet 1   atorvastatin (LIPITOR) 40 MG tablet TAKE 1 TABLET BY MOUTH EVERY DAY 90 tablet 1   blood glucose meter kit and supplies Dispense based on patient and insurance preference. Use to check sugar once daily as directed. (FOR ICD-10 E10.9, E11.9). 1 each 0   dapagliflozin propanediol (FARXIGA) 10 MG TABS tablet Take 1 tablet (10 mg total) by mouth daily before breakfast. 28 tablet 0   levothyroxine (SYNTHROID) 88 MCG tablet TAKE 1 TABLET (88 MCG TOTAL) BY MOUTH DAILY BEFORE BREAKFAST. 90 tablet 3   losartan (COZAAR) 100 MG tablet TAKE 1 TABLET BY MOUTH EVERYDAY AT BEDTIME 90 tablet 3   Multiple Vitamin (MULTIVITAMIN) tablet Take 1 tablet by mouth daily.     nitroGLYCERIN (NITROSTAT) 0.4 MG SL tablet Place under the tongue.     omeprazole (PRILOSEC) 40 MG capsule TAKE 1 CAPSULE BY MOUTH EVERY DAY 90 capsule 1   ONETOUCH VERIO test strip USE TO CHECK SUGAR ONCE DAILY E11.9 (VERIO PER PT) 100 strip 3   rOPINIRole (REQUIP) 0.25 MG tablet      traZODone (DESYREL) 100 MG tablet Take 1.5 tablets (150 mg total) by mouth at bedtime. [place]  tablet 5   venlafaxine XR (EFFEXOR-XR) 75 MG 24 hr capsule Take 1 capsule (75 mg total) by mouth daily. 30 capsule 5   venlafaxine XR (EFFEXOR-XR) 150 MG 24 hr capsule TAKE 1 CAPSULE BY MOUTH EVERY DAY (Patient not taking: Reported on 03/10/2021) 90 capsule 1   No facility-administered medications prior to visit.    Review of Systems;  Patient denies headache, fevers, malaise, unintentional weight loss, skin rash, eye pain, sinus congestion and sinus pain, sore throat, dysphagia,  hemoptysis , cough, dyspnea, wheezing, chest pain, palpitations, orthopnea,  edema, abdominal pain, nausea, melena, diarrhea, constipation, flank pain, dysuria, hematuria, urinary  Frequency, nocturia, numbness, tingling, seizures,  Focal weakness, Loss of consciousness,  Tremor, insomnia, depression, anxiety, and suicidal ideation.      Objective:  BP 124/70   Pulse 95   Temp 98.4 F (36.9 C)   Ht '5\' 5"'  (1.651 m)   Wt 183 lb 12.8 oz (83.4 kg)   SpO2 95%   BMI 30.59 kg/m   BP Readings from Last 3 Encounters:  03/10/21 124/70  01/28/21 118/68  10/06/20 134/74    Wt Readings from Last 3 Encounters:  03/10/21 183 lb 12.8 oz (83.4 kg)  01/28/21 184 lb 3.2 oz (83.6 kg)  10/06/20 177 lb 6.4 oz (80.5 kg)    General appearance: alert, cooperative and appears stated age Face: no temple tenderness to palpation  Ears: normal TM's and external ear canals both ears Throat: lips, mucosa, and tongue normal; teeth and gums normal Neck: no adenopathy, no carotid bruit, supple, symmetrical, trachea midline and thyroid not enlarged, symmetric, no tenderness/mass/nodules Back: symmetric, no curvature. ROM normal. No CVA tenderness. Lungs: clear to auscultation bilaterally Heart: regular rate and rhythm, S1, S2 normal, no murmur, click, rub or gallop Abdomen: soft, non-tender; bowel sounds normal; no masses,  no organomegaly Pulses: 2+ and symmetric Skin: Skin color, texture, turgor normal. No rashes or lesions MSK:  no proximal muscle weakness.  Straight leg lift negative  Lymph nodes: Cervical, supraclavicular, and axillary nodes normal. Cognitive Testing MMSE - Mini Mental State Exam     Orientation to time  5   Orientation to Place  5   Registration  3   Attention/ Calculation  4  Recall  2  Language- name 2 objects  2   Language- repeat  1   Language- follow 3 step command  3   Language- read & follow direction  1   Write a sentence  1   Copy design  1   Total score  28              Lab Results  Component Value Date   HGBA1C 6.4 01/28/2021    HGBA1C 5.8 (A) 07/08/2020   HGBA1C 6.0 04/08/2020    Lab Results  Component Value Date   CREATININE 1.07 01/28/2021   CREATININE 1.06 (H) 06/08/2020   CREATININE 0.98 04/08/2020    Lab Results  Component Value Date   WBC 7.2 01/28/2021   HGB 12.5 01/28/2021   HCT 36.4 01/28/2021   PLT 226.0 01/28/2021   GLUCOSE 116 (H) 01/28/2021   CHOL 147 01/28/2021   TRIG 262.0 (H) 01/28/2021   HDL 52.50 01/28/2021   LDLDIRECT 66.0 01/28/2021   LDLCALC 47 04/08/2020   ALT 30 01/28/2021   AST 25 01/28/2021   NA 140 01/28/2021   K 4.4 01/28/2021   CL 101 01/28/2021   CREATININE 1.07 01/28/2021   BUN 17 01/28/2021   CO2 29  01/28/2021   TSH 1.65 01/28/2021   HGBA1C 6.4 01/28/2021   MICROALBUR <0.7 01/28/2021    C  Assessment & Plan:   Problem List Items Addressed This Visit       Unprioritized   COPD (chronic obstructive pulmonary disease) with emphysema ([place])    Asymptomatic but lives a sedentary lifestyle.  I have again encouraged to quit smoking and start walking on a daily basis to improve conditioning.        Depression    She was unable to afford wellbutrin and has continued effexor at the reduced dose of 75 mg daily .    Her insomnia has improved with higher dose of trazodone.       OSA on CPAP    Diagnosed remotely by sleep study. 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  Continue effexor,  Confirmation of  correct dose is  Needed       Trigeminal neuralgia of right side of face    Treated surgically with a right MVD in 2017 as she had failed resolution with 3 trials of medications      Type 2 diabetes mellitus with diabetic chronic kidney disease ([place]) - Primary    Managed with metformin, Farxiga, ASA ,  ARB and atorvastatin.  Lab Results  Component Value Date   HGBA1C 6.4 01/28/2021   Lab Results  Component Value Date   MICROALBUR <0.7 01/28/2021   MICROALBUR 0.7 04/08/2020     Lab Results  Component Value Date   CREATININE 1.07 01/28/2021         Relevant Orders   Hemoglobin A1c   Comprehensive metabolic panel   Lipid panel   Cognitive changes    Screening labs are normal,  MRI ordered  Lab Results  Component Value Date   TSH 1.65 01/28/2021   . Last vitamin B12 and Folate Lab Results  Component Value Date   VITAMINB12 882 01/28/2021   FOLATE 23.8 01/28/2021        Headache, new daily persistent ([facility])    Improved with improvement in insomnia management,  But she continues to have a feeling of pressure that is consistent.  Given her history of craniotomy for iatrogenic CSF leak in 2017 and current report of cognitive changes,  MRI is needed to evaluate ventricles.         Other Visit Diagnoses     Chronic daily headache       Relevant Orders   MR Brain Wo Contrast       I spent 30 minutes dedicated to the care of this patient on the date of this encounter to include pre-visit review of his medical history,  Face-to-face time with the patient , and post visit ordering of testing and therapeutics.   Medications Discontinued During This Encounter  Medication Reason   venlafaxine XR (EFFEXOR-XR) 150 MG 24 hr capsule     Follow-up: Return for follow up diabetes.   [name], MD

## 2021-03-10 NOTE — Patient Instructions (Addendum)
Continue your current medications.  Farxifa protects the heart and kidneys   You can take  up to 1000 mg of acetominophen (tylenol) every day safely  In divided doses (500 mg every 12 hours every 12 hours.)    Return for labs in late October  MRI brain to be ordered to investigate your headaches

## 2021-03-12 ENCOUNTER — Telehealth: Payer: Self-pay | Admitting: Pharmacy Technician

## 2021-03-12 DIAGNOSIS — Z596 Low income: Secondary | ICD-10-CM

## 2021-03-12 NOTE — Progress Notes (Signed)
[place] [facility])                                            [place] Team    03/12/2021  JAUNA BORAM 20-Mar-1946 [id number]  Care coordination call placed to [employer] in regards to [facility] application.  Spoke to [employer] who informed patient was APPROVED 02/23/2021-07/10/2021.   Austynn Pridmore P. Bari Leib, [place]  [phone]

## 2021-03-13 NOTE — Assessment & Plan Note (Signed)
Screening labs are normal,  MRI ordered  Lab Results  Component Value Date   TSH 1.65 01/28/2021   . Last vitamin B12 and Folate Lab Results  Component Value Date   VITAMINB12 882 01/28/2021   FOLATE 23.8 01/28/2021

## 2021-03-13 NOTE — Assessment & Plan Note (Signed)
Treated surgically with a right MVD in 2017 as she had failed resolution with 3 trials of medications

## 2021-03-13 NOTE — Assessment & Plan Note (Signed)
Diagnosed remotely by sleep study. She states that she is  wearing her CPAP every night a minimum of 6 hours per night and notes improved daytime wakefulness and decreased fatigue  

## 2021-03-13 NOTE — Assessment & Plan Note (Signed)
Improved with improvement in insomnia management,  But she continues to have a feeling of pressure that is consistent.  Given her history of craniotomy for iatrogenic CSF leak in 2017 and current report of cognitive changes,  MRI is needed to evaluate ventricles.

## 2021-03-13 NOTE — Assessment & Plan Note (Signed)
Asymptomatic but lives a sedentary lifestyle.  I have again encouraged to quit smoking and start walking on a daily basis to improve conditioning.

## 2021-03-13 NOTE — Assessment & Plan Note (Addendum)
She was unable to afford wellbutrin and has continued effexor at the reduced dose of 75 mg daily .    Her insomnia has improved with higher dose of trazodone.

## 2021-03-13 NOTE — Assessment & Plan Note (Signed)
Managed with metformin, Farxiga, ASA , ARB and atorvastatin.  Lab Results  Component Value Date   HGBA1C 6.4 01/28/2021   Lab Results  Component Value Date   MICROALBUR <0.7 01/28/2021   MICROALBUR 0.7 04/08/2020     Lab Results  Component Value Date   CREATININE 1.07 01/28/2021

## 2021-03-13 NOTE — Assessment & Plan Note (Signed)
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  Continue effexor,  Confirmation of  correct dose is  Needed

## 2021-03-17 ENCOUNTER — Ambulatory Visit
Admission: RE | Admit: 2021-03-17 | Discharge: 2021-03-17 | Disposition: A | Discharge status: Home / Self Care | Payer: Medicare Other | Source: Ambulatory Visit | Attending: Internal Medicine | Admitting: Internal Medicine

## 2021-03-17 ENCOUNTER — Other Ambulatory Visit: Payer: Self-pay

## 2021-03-17 DIAGNOSIS — R519 Headache, unspecified: Secondary | ICD-10-CM | POA: Diagnosis not present

## 2021-03-19 ENCOUNTER — Other Ambulatory Visit: Payer: Self-pay | Admitting: Internal Medicine

## 2021-03-19 ENCOUNTER — Ambulatory Visit (INDEPENDENT_AMBULATORY_CARE_PROVIDER_SITE_OTHER): Payer: Medicare Other | Admitting: Pharmacist

## 2021-03-19 DIAGNOSIS — E1122 Type 2 diabetes mellitus with diabetic chronic kidney disease: Secondary | ICD-10-CM

## 2021-03-19 DIAGNOSIS — Z720 Tobacco use: Secondary | ICD-10-CM

## 2021-03-19 DIAGNOSIS — F3341 Major depressive disorder, recurrent, in partial remission: Secondary | ICD-10-CM

## 2021-03-19 DIAGNOSIS — F411 Generalized anxiety disorder: Secondary | ICD-10-CM

## 2021-03-19 DIAGNOSIS — J431 Panlobular emphysema: Secondary | ICD-10-CM

## 2021-03-19 NOTE — Chronic Care Management (AMB) (Signed)
Chronic Care Management Pharmacy Note  03/19/2021 Name:  Crystal Meyers MRN:  [id number] DOB:  Jan 03, 1946   Subjective: Crystal Meyers is an 75 y.o. year old female who is a primary patient of Tullo, Aris Everts, MD.  The CCM team was consulted for assistance with disease management and care coordination needs.    Engaged with patient by telephone for follow up visit in response to provider referral for pharmacy case management and/or care coordination services.   Consent to Services:  The patient was given information about Chronic Care Management services, agreed to services, and gave verbal consent prior to initiation of services.  Please see initial visit note for detailed documentation.   Patient Care Team: [name], MD as PCP - General (Internal Medicine) [name], MD (Unknown Physician Specialty) [name], RPH-CPP (Pharmacist)  Objective:  Lab Results  Component Value Date   CREATININE 1.07 01/28/2021   CREATININE 1.06 (H) 06/08/2020   CREATININE 0.98 04/08/2020    Lab Results  Component Value Date   HGBA1C 6.4 01/28/2021   Last diabetic Eye exam:  Lab Results  Component Value Date/Time   HMDIABEYEEXA No Retinopathy 05/08/2020 12:00 AM    Last diabetic Foot exam: No results found for: HMDIABFOOTEX      Component Value Date/Time   CHOL 147 01/28/2021 1318   TRIG 262.0 (H) 01/28/2021 1318   HDL 52.50 01/28/2021 1318   CHOLHDL 3 01/28/2021 1318   VLDL 52.4 (H) 01/28/2021 1318   LDLCALC 47 04/08/2020 1051   LDLDIRECT 66.0 01/28/2021 1318    Hepatic Function Latest Ref Rng & Units 01/28/2021 06/08/2020 04/08/2020  Total Protein 6.0 - 8.3 g/dL 6.5 7.4 6.5  Albumin 3.5 - 5.2 g/dL 4.1 4.1 4.2  AST 0 - 37 U/L _0 ALT 0 - 35 U/L _1 Alk Phosphatase 39 - 117 U/L 43 40 41  Total Bilirubin 0.2 - 1.2 mg/dL 0.3 0.4 0.4    Lab Results  Component Value Date/Time   TSH 1.65 01/28/2021 01:18 PM   TSH 2.41 04/08/2020 10:51 AM     CBC Latest Ref Rng & Units 01/28/2021 06/08/2020 04/15/2019  WBC 4.0 - 10.5 K/uL 7.2 7.6 7.9  Hemoglobin 12.0 - 15.0 g/dL 12.5 13.9 13.2  Hematocrit 36.0 - 46.0 % 36.4 41.4 38.9  Platelets 150.0 - 400.0 K/uL 226.0 232 274    Lab Results  Component Value Date/Time   VD25OH 36.98 12/06/2016 02:11 PM   VD25OH 29.29 (L) 08/31/2016 10:54 AM    Clinical ASCVD: No  The 10-year ASCVD risk score (Arnett DK, et al., 2019) is: 36.4%   Values used to calculate the score:     Age: 43 years     Sex: Female     Is Non-Hispanic African American: No     Diabetic: Yes     Tobacco smoker: Yes     Systolic Blood Pressure: 622 mmHg     Is BP treated: Yes     HDL Cholesterol: 52.5 mg/dL     Total Cholesterol: 147 mg/dL     Social History   Tobacco Use  Smoking Status Every Day   Packs/day: 1.00   Years: 54.00   Pack years: 54.00   Types: Cigarettes  Smokeless Tobacco Never  Tobacco Comments   Refused cessation material   BP Readings from Last 3 Encounters:  03/10/21 124/70  01/28/21 118/68  10/06/20 134/74   Pulse Readings from Last 3 Encounters:  03/10/21 95  01/28/21 87  10/06/20 85   Wt Readings from Last 3 Encounters:  03/10/21 183 lb 12.8 oz (83.4 kg)  01/28/21 184 lb 3.2 oz (83.6 kg)  10/06/20 177 lb 6.4 oz (80.5 kg)    Assessment: Review of patient past medical history, allergies, medications, health status, including review of consultants reports, laboratory and other test data, was performed as part of comprehensive evaluation and provision of chronic care management services.   SDOH:  (Social Determinants of Health) assessments and interventions performed:  SDOH Interventions    Flowsheet Row Most Recent Value  SDOH Interventions   Financial Strain Interventions Other (Comment)  [manufacturer assistance]  Tobacco Interventions Cessation Materials Given and Reviewed       CCM Care Plan  Allergies  Allergen Reactions   Lamisil [Terbinafine] Rash   No  Known Allergies Rash    Medications Reviewed Today     Reviewed by [name], RPH-CPP (Pharmacist) on 03/19/21 at 1237  Med List Status: <None>   Medication Order Taking? Sig Documenting Provider Last Dose Status Informant  Accu-Chek Softclix Lancets lancets [id number]  Use to check sugar once daily. Dx: E11.9 [name], MD  Active   aspirin EC 81 MG tablet [id number] Yes Take 1 tablet (81 mg total) by mouth daily. [name], MD Taking Active   atorvastatin (LIPITOR) 40 MG tablet [id number] Yes TAKE 1 TABLET BY MOUTH EVERY DAY [name], MD Taking Active   blood glucose meter kit and supplies [id number] Yes Dispense based on patient and insurance preference. Use to check sugar once daily as directed. (FOR ICD-10 E10.9, E11.9). [name], MD Taking Active   dapagliflozin propanediol (FARXIGA) 10 MG TABS tablet [id number] Yes Take 1 tablet (10 mg total) by mouth daily before breakfast. [name], MD Taking Active   levothyroxine (SYNTHROID) 88 MCG tablet [id number] Yes TAKE 1 TABLET (88 MCG TOTAL) BY MOUTH DAILY BEFORE BREAKFAST. [name], MD Taking Active   losartan (COZAAR) 100 MG tablet [id number] Yes TAKE 1 TABLET BY MOUTH EVERYDAY AT BEDTIME [name], MD Taking Active   Multiple Vitamin (MULTIVITAMIN) tablet [id number] Yes Take 1 tablet by mouth daily. [provider] Taking Active   nitroGLYCERIN (NITROSTAT) 0.4 MG SL tablet [id number] No Place under the tongue.  Patient not taking: Reported on 03/19/2021   [provider] Not Taking Active   omeprazole (PRILOSEC) 40 MG capsule [id number] Yes TAKE 1 CAPSULE BY MOUTH EVERY DAY [name], MD Taking Active   [facility] VERIO test strip [id number] Yes USE TO CHECK SUGAR ONCE DAILY E11.9 (VERIO PER PT) [name], MD Taking Active   rOPINIRole (REQUIP) 0.25 MG tablet [id number] Yes Take 0.25 mg by mouth at bedtime as needed. [provider] Taking Active   traZODone  (DESYREL) 100 MG tablet [id number] Yes Take 1.5 tablets (150 mg total) by mouth at bedtime. [name], MD Taking Active   venlafaxine XR (EFFEXOR-XR) 150 MG 24 hr capsule [id number] Yes Take 150 mg by mouth daily with breakfast. [provider] Taking Active             Patient Active Problem List   Diagnosis Date Noted   Type 2 diabetes mellitus with diabetic chronic kidney disease ([place]) 01/31/2021   Major depressive disorder, recurrent episode with melancholic features ([place]) 53/97/6734   Cognitive changes 01/31/2021   Headache, new daily persistent ([facility]) 01/31/2021   Counseling on substance use and abuse  01/31/2021   Aortic atherosclerosis ([place]) 07/10/2020   Tubular adenoma of colon 04/21/2020   Family history of colon cancer requiring screening colonoscopy 04/09/2020   Ingrown nail of fifth toe of left foot 09/02/2019   Hyperlipidemia associated with type 2 diabetes mellitus ([place]) 12/25/2018   History of DVT (deep vein thrombosis) 03/22/2018   Leg edema, left 03/18/2018   Restless legs syndrome 09/14/2017   Vasomotor flushing 03/09/2017   Chronic venous insufficiency 12/29/2016   Lymphedema 12/29/2016   History of breast cancer in female 04/14/2016   Trigeminal neuralgia of right side of face 01/21/2016   HNP (herniated nucleus pulposus), lumbar 08/11/2015   Sciatica neuralgia 07/06/2015   Coronary artery disease due to calcified coronary lesion 06/12/2015   Essential hypertension 06/12/2015   Edema 06/12/2015   Personal history of tobacco use, presenting hazards to health 05/06/2015   Arthritis 04/05/2015   CAFL (chronic airflow limitation) ([place]) 04/05/2015   Hypothyroidism 02/10/2015   Multiple pulmonary nodules 10/15/2014   Generalized anxiety disorder 05/18/2014   Nicotine addiction 02/12/2014   Overweight (BMI 25.0-29.9) 08/28/2012   Routine general medical examination at a health care facility 08/27/2012   Barrett's esophagus 08/27/2012   OSA on CPAP  10/14/2011   COPD (chronic obstructive pulmonary disease) with emphysema ([place]) 07/27/2011   Tobacco abuse 07/27/2011   Tobacco abuse counseling 07/27/2011   Depression 07/27/2011   Current tobacco use 07/27/2011    Immunization History  Administered Date(s) Administered   Fluad Quad(high Dose 65+) 04/10/2019, 04/08/2020   Influenza, High Dose Seasonal PF 06/09/2017, 03/15/2018   Influenza,inj,Quad PF,6+ Mos 09/05/2016   PFIZER(Purple Top)SARS-COV-2 Vaccination 02/19/2020, 03/13/2020   Pneumococcal Conjugate-13 11/25/2013   Pneumococcal Polysaccharide-23 11/26/2006, 07/03/2015   Tdap 04/13/2018    Conditions to be addressed/monitored: CAD, HTN, HLD, DMII, and CKD  Care Plan : Medication Management  Updates made by [name], RPH-CPP since 03/19/2021 12:00 AM     Problem: Diabetes, CAD, CKD      Long-Range Goal: Disease Progression Prevention   Start Date: 02/17/2021  This Visit's Progress: On track  Recent Progress: On track  Priority: High  Note:   Current Barriers:  Unable to independently afford treatment regimen Suboptimal therapeutic regimen for cardiovascular risk reduction  Pharmacist Clinical Goal(s):  Over the next 90 days, patient will verbalize ability to afford treatment regimen through collaboration with PharmD and provider.    Interventions: 1:1 collaboration with [name], MD regarding development and update of comprehensive plan of care as evidenced by provider attestation and co-signature Inter-disciplinary care team collaboration (see longitudinal plan of care) Comprehensive medication review performed; medication list updated in electronic medical record  SDOH: Daughter and son in law living with her, daughter fell and broke hip recently. Providing care for them and grandchildren.  Receives SSI + husband's pension. Over income for Extra Help.   Health Maintenance Yearly diabetic eye exam: up to date Yearly diabetic foot exam: up to  date Urine microalbumin: up to date Yearly influenza vaccination: due - recommended to pursue vaccination Td/Tdap vaccination: up to date Pneumonia vaccination: up to date COVID vaccinations: due - for bivalent booster. Recommended to pursue Shingrix vaccinations: due  - will discuss moving forward  Colonoscopy: due  Diabetes: Controlled per last A1c; current treatment: Farxiga 10 mg daily Denies concerns with genitourinary side effects. Did report polyuria at first, but this has improved.   Previously on metformin. Decided to hold when starting Farxiga and follow A1c for need to restart Approved for  Farxiga assistance through [employer] through 07/10/21 Current blood sugars: yesterday 200 (after a frappe) Recommended to continue current regimen at this time. If next A1c >7%, recommend adding metformin back in combination with [place] in [place].   Hypertension in the setting of CKD: Controlled per home readings; current treatment: losartan 100 mg daily;  Current home readings: has not checked recently; prior readings ~110-120s/70-80 Recommended to continue losartan 100 mg daily. She asked about prior recommendation for nephrology referral. Will collaborate w/ PCP to place this  Hyperlipidemia/CAD: Controlled; current treatment: atorvastatin 40 mg daily;  Antiplatelet regimen: aspirin 81 mg daily Recommended to continue current regimen at this time  Depression/Insomnia/RLS: Improved per patient report; current regimen: venlafaxine 150 mg daily, trazodone 150 mg QPM, ropinirole 0.25 mg PRN - report she doesn't generally use Bupropion previously prescribed, but was costly on her insurance plan.  Reviewed nonpharmacologic options to help with insomnia. No caffeine during the day, no naps. States that she does play on the computer before bed.  Reports she doesn't look forward to going to bed, related to CPAP.  Counseled to consider reducing screen time before bed. Encouraged to continue  current regimen at this time  Tobacco Abuse: 1 packs per day; 60 years of use; does smoke within 30 minutes of waking up Previous quit attempts: unsuccessful using bupropion (insomnia), varenicline (reports insomnia, lack of benefit); reports she has looked into nicotine replacement therapy but cost was too expensive; does report she was able to quit for a year when she had breast cancer. Triggers to smoke: waking, meals, back pain Motivation to quit smoking: health. Recent brain scan. More motivated to quit  Reports that over the counter cost for nicotine replacement therapy was too high. Discussed [place] Quit Line. Provided phone number. Encouraged to call to pursue supply of nicotine replacement therapy.   Hypothyroidism: Controlled per last lab work; current regimen: levothyroxine 88 mcg daily Previously recommended to continue current regimen at this time  GERD/Barrett's Esophagus: Controlled; current regimen: omeprazole 40 mg daily Previously recommended that she continue current regimen  Patient Goals/Self-Care Activities Over the next 90 days, patient will:  - take medications as prescribed check blood pressure periodically, document, and provide at future appointments collaborate with provider on medication access solutions  Follow Up Plan: Telephone follow up appointment with care management team member scheduled for: ~ 6 weeks      Medication Assistance:  [place] obtained through [employer] medication assistance program.  Enrollment ends 07/10/21  Patient's preferred pharmacy is:  [id number]: [phone]: [phone] [facility] [place] [address]Phone: [phone]: [phone]  Follow Up:  Patient agrees to Care Plan and Follow-up.  Plan: Telephone  follow up appointment with care management team member scheduled for:  ~ 6 weeks  Catie TDarnelle Maffucci PharmD, [place] COverland ParkClinical Pharmacist LOccidental Petroleumat BJohnson [employer] [phone]

## 2021-03-19 NOTE — Patient Instructions (Signed)
Ms. Pitcairn,   It was great talking to you today!  Check your blood sugars twice daily:  1) Fasting, first thing in the morning before breakfast and  2) 2 hours after your largest meal.   For a goal A1c of less than 7%, goal fasting readings are less than 130 and goal 2 hour after meal readings are less than 180.   We recommend you get the yearly influenza vaccine for this season. We recommend you get the updated bivalent COVID-19 booster, at least 2 months after any prior doses.   Take care!  [name], PharmD [phone]  Visit Information  PATIENT GOALS:  Goals Addressed               This Visit's Progress     Patient Stated     Medication Monitoring (pt-stated)        Patient Goals/Self-Care Activities Over the next 90 days, patient will:  - take medications as prescribed check blood pressure periodically, document, and provide at future appointments collaborate with provider on medication access solutions         Patient verbalizes understanding of instructions provided today and agrees to view in [place].    Plan: Telephone follow up appointment with care management team member scheduled for:  ~ 6 weeks  [name], PharmD, [place], [place] Clinical Pharmacist [employer] at [employer] [phone]

## 2021-04-01 ENCOUNTER — Other Ambulatory Visit: Payer: Self-pay | Admitting: Internal Medicine

## 2021-04-01 ENCOUNTER — Ambulatory Visit (INDEPENDENT_AMBULATORY_CARE_PROVIDER_SITE_OTHER): Payer: Medicare Other

## 2021-04-01 ENCOUNTER — Telehealth: Payer: Self-pay

## 2021-04-01 VITALS — Ht 65.0 in | Wt 183.0 lb

## 2021-04-01 DIAGNOSIS — N182 Chronic kidney disease, stage 2 (mild): Secondary | ICD-10-CM

## 2021-04-01 DIAGNOSIS — E1122 Type 2 diabetes mellitus with diabetic chronic kidney disease: Secondary | ICD-10-CM

## 2021-04-01 DIAGNOSIS — Z0000 Encounter for general adult medical examination without abnormal findings: Secondary | ICD-10-CM | POA: Diagnosis not present

## 2021-04-01 NOTE — Patient Instructions (Addendum)
Crystal Meyers , Thank you for taking time to come for your Medicare Wellness Visit. I appreciate your ongoing commitment to your health goals. Please review the following plan we discussed and let me know if I can assist you in the future.   These are the goals we discussed:  Goals       Patient Stated     Increase physical activity (pt-stated)      start walking for exercise      Medication Monitoring (pt-stated)      Patient Goals/Self-Care Activities Over the next 90 days, patient will:  - take medications as prescribed check blood pressure periodically, document, and provide at future appointments collaborate with provider on medication access solutions       Other     Quit Smoking      Decrease the amount of cigarettes smoked         This is a list of the screening recommended for you and due dates:  Health Maintenance  Topic Date Due   COVID-19 Vaccine (3 - Pfizer risk series) 04/17/2021*   Zoster (Shingles) Vaccine (1 of 2) 07/01/2021*   Flu Shot  10/08/2021*   Complete foot exam   04/08/2021   Eye exam for diabetics  05/08/2021   Hemoglobin A1C  07/31/2021   Colon Cancer Screening  04/20/2025   Tetanus Vaccine  04/13/2028   DEXA scan (bone density measurement)  Completed   Hepatitis C Screening: USPSTF Recommendation to screen - Ages 15-79 yo.  Completed   HPV Vaccine  Aged Out  *Topic was postponed. The date shown is not the original due date.    Advanced directives: End of life planning; Advance aging; Advanced directives discussed.  Copy of current HCPOA/Living Will requested.    Conditions/risks identified: none new  Follow up in one year for your annual wellness visit    Preventive Care 65 Years and Older, Female Preventive care refers to lifestyle choices and visits with your health care provider that can promote health and wellness. What does preventive care include? A yearly physical exam. This is also called an annual well check. Dental exams once  or twice a year. Routine eye exams. Ask your health care provider how often you should have your eyes checked. Personal lifestyle choices, including: Daily care of your teeth and gums. Regular physical activity. Eating a healthy diet. Avoiding tobacco and drug use. Limiting alcohol use. Practicing safe sex. Taking low-dose aspirin every day. Taking vitamin and mineral supplements as recommended by your health care provider. What happens during an annual well check? The services and screenings done by your health care provider during your annual well check will depend on your age, overall health, lifestyle risk factors, and family history of disease. Counseling  Your health care provider may ask you questions about your: Alcohol use. Tobacco use. Drug use. Emotional well-being. Home and relationship well-being. Sexual activity. Eating habits. History of falls. Memory and ability to understand (cognition). Work and work Statistician. Reproductive health. Screening  You may have the following tests or measurements: Height, weight, and BMI. Blood pressure. Lipid and cholesterol levels. These may be checked every 5 years, or more frequently if you are over 34 years old. Skin check. Lung cancer screening. You may have this screening every year starting at age 45 if you have a 30-pack-year history of smoking and currently smoke or have quit within the past 15 years. Fecal occult blood test (FOBT) of the stool. You may have this test  every year starting at age 43. Flexible sigmoidoscopy or colonoscopy. You may have a sigmoidoscopy every 5 years or a colonoscopy every 10 years starting at age 34. Hepatitis C blood test. Hepatitis B blood test. Sexually transmitted disease (STD) testing. Diabetes screening. This is done by checking your blood sugar (glucose) after you have not eaten for a while (fasting). You may have this done every 1-3 years. Bone density scan. This is done to screen  for osteoporosis. You may have this done starting at age 57. Mammogram. This may be done every 1-2 years. Talk to your health care provider about how often you should have regular mammograms. Talk with your health care provider about your test results, treatment options, and if necessary, the need for more tests. Vaccines  Your health care provider may recommend certain vaccines, such as: Influenza vaccine. This is recommended every year. Tetanus, diphtheria, and acellular pertussis (Tdap, Td) vaccine. You may need a Td booster every 10 years. Zoster vaccine. You may need this after age 11. Pneumococcal 13-valent conjugate (PCV13) vaccine. One dose is recommended after age 34. Pneumococcal polysaccharide (PPSV23) vaccine. One dose is recommended after age 59. Talk to your health care provider about which screenings and vaccines you need and how often you need them. This information is not intended to replace advice given to you by your health care provider. Make sure you discuss any questions you have with your health care provider. Document Released: 07/24/2015 Document Revised: 03/16/2016 Document Reviewed: 04/28/2015 Elsevier Interactive Patient Education  2017 [employer] Prevention in the Home Falls can cause injuries. They can happen to people of all ages. There are many things you can do to make your home safe and to help prevent falls. What can I do on the outside of my home? Regularly fix the edges of walkways and driveways and fix any cracks. Remove anything that might make you trip as you walk through a door, such as a raised step or threshold. Trim any bushes or trees on the path to your home. Use bright outdoor lighting. Clear any walking paths of anything that might make someone trip, such as rocks or tools. Regularly check to see if handrails are loose or broken. Make sure that both sides of any steps have handrails. Any raised decks and porches should have guardrails  on the edges. Have any leaves, snow, or ice cleared regularly. Use sand or salt on walking paths during winter. Clean up any spills in your garage right away. This includes oil or grease spills. What can I do in the bathroom? Use night lights. Install grab bars by the toilet and in the tub and shower. Do not use towel bars as grab bars. Use non-skid mats or decals in the tub or shower. If you need to sit down in the shower, use a plastic, non-slip stool. Keep the floor dry. Clean up any water that spills on the floor as soon as it happens. Remove soap buildup in the tub or shower regularly. Attach bath mats securely with double-sided non-slip rug tape. Do not have throw rugs and other things on the floor that can make you trip. What can I do in the bedroom? Use night lights. Make sure that you have a light by your bed that is easy to reach. Do not use any sheets or blankets that are too big for your bed. They should not hang down onto the floor. Have a firm chair that has side arms. You can use  this for support while you get dressed. Do not have throw rugs and other things on the floor that can make you trip. What can I do in the kitchen? Clean up any spills right away. Avoid walking on wet floors. Keep items that you use a lot in easy-to-reach places. If you need to reach something above you, use a strong step stool that has a grab bar. Keep electrical cords out of the way. Do not use floor polish or wax that makes floors slippery. If you must use wax, use non-skid floor wax. Do not have throw rugs and other things on the floor that can make you trip. What can I do with my stairs? Do not leave any items on the stairs. Make sure that there are handrails on both sides of the stairs and use them. Fix handrails that are broken or loose. Make sure that handrails are as long as the stairways. Check any carpeting to make sure that it is firmly attached to the stairs. Fix any carpet that is  loose or worn. Avoid having throw rugs at the top or bottom of the stairs. If you do have throw rugs, attach them to the floor with carpet tape. Make sure that you have a light switch at the top of the stairs and the bottom of the stairs. If you do not have them, ask someone to add them for you. What else can I do to help prevent falls? Wear shoes that: Do not have high heels. Have rubber bottoms. Are comfortable and fit you well. Are closed at the toe. Do not wear sandals. If you use a stepladder: Make sure that it is fully opened. Do not climb a closed stepladder. Make sure that both sides of the stepladder are locked into place. Ask someone to hold it for you, if possible. Clearly mark and make sure that you can see: Any grab bars or handrails. First and last steps. Where the edge of each step is. Use tools that help you move around (mobility aids) if they are needed. These include: Canes. Walkers. Scooters. Crutches. Turn on the lights when you go into a dark area. Replace any light bulbs as soon as they burn out. Set up your furniture so you have a clear path. Avoid moving your furniture around. If any of your floors are uneven, fix them. If there are any pets around you, be aware of where they are. Review your medicines with your doctor. Some medicines can make you feel dizzy. This can increase your chance of falling. Ask your doctor what other things that you can do to help prevent falls. This information is not intended to replace advice given to you by your health care provider. Make sure you discuss any questions you have with your health care provider. Document Released: 04/23/2009 Document Revised: 12/03/2015 Document Reviewed: 08/01/2014 Elsevier Interactive Patient Education  2017 [employer].

## 2021-04-01 NOTE — Progress Notes (Addendum)
Subjective:   Crystal Meyers is a 75 y.o. female who presents for Medicare Annual (Subsequent) preventive examination.  Review of Systems    No ROS.  Medicare Wellness Virtual Visit.  Visual/audio telehealth visit, UTA vital signs.   See social history for additional risk factors.   Cardiac Risk Factors include: advanced age (>67mn, >>63women);diabetes mellitus     Objective:    Today's Vitals   04/01/21 0948  Weight: 183 lb (83 kg)  Height: _0  (1.651 m)   Body mass index is 30.45 kg/m.  Advanced Directives 04/01/2021 06/08/2020 04/20/2020 03/31/2020 04/15/2019 04/12/2019 03/21/2019  Does Patient Have a Medical Advance Directive? _1  Yes Yes  Type of AParamedicof AGracevilleLiving will [place]Living will Living will HClevelandLiving will [place]alf[place]Living will Living will;Healthcare Power of Attorney Living will;Healthcare Power of Attorney  Does patient want to make changes to medical advance directive? No - Patient declined No - Patient declined - No - Patient declined - - No - Patient declined  Copy of [place] Chart? No - copy requested Yes - validated most recent copy scanned in chart (See row information) - Yes - validated most recent copy scanned in chart (See row information) - No - copy requested Yes - validated most recent copy scanned in chart (See row information)  Would patient like information on creating a medical advance directive? - - - - - - -    Current Medications (verified) Outpatient Encounter Medications as of 04/01/2021  Medication Sig   Accu-Chek Softclix Lancets lancets Use to check sugar once daily. Dx: E11.9   aspirin EC 81 MG tablet Take 1 tablet (81 mg total) by mouth daily.   atorvastatin (LIPITOR) 40 MG tablet TAKE 1 TABLET BY MOUTH EVERY DAY   blood glucose meter kit and supplies Dispense based on patient and insurance  preference. Use to check sugar once daily as directed. (FOR ICD-10 E10.9, E11.9).   dapagliflozin propanediol (FARXIGA) 10 MG TABS tablet Take 1 tablet (10 mg total) by mouth daily before breakfast.   levothyroxine (SYNTHROID) 88 MCG tablet TAKE 1 TABLET (88 MCG TOTAL) BY MOUTH DAILY BEFORE BREAKFAST.   losartan (COZAAR) 100 MG tablet TAKE 1 TABLET BY MOUTH EVERYDAY AT BEDTIME   Multiple Vitamin (MULTIVITAMIN) tablet Take 1 tablet by mouth daily.   nitroGLYCERIN (NITROSTAT) 0.4 MG SL tablet Place under the tongue. (Patient not taking: Reported on 03/19/2021)   omeprazole (PRILOSEC) 40 MG capsule TAKE 1 CAPSULE BY MOUTH EVERY DAY   ONETOUCH VERIO test strip USE TO CHECK SUGAR ONCE DAILY E11.9 (VERIO PER PT)   rOPINIRole (REQUIP) 0.25 MG tablet Take 0.25 mg by mouth at bedtime as needed.   traZODone (DESYREL) 100 MG tablet Take 1.5 tablets (150 mg total) by mouth at bedtime.   venlafaxine XR (EFFEXOR-XR) 150 MG 24 hr capsule Take 150 mg by mouth daily with breakfast.   No facility-administered encounter medications on file as of 04/01/2021.    Allergies (verified) Lamisil [terbinafine] and No known allergies   History: Past Medical History:  Diagnosis Date   Anxiety    Asthma    Barrett's esophagus    Breast cancer ([place] 7[place]10   left , invasive lobular carcinoma   Colon polyps    Complication of anesthesia    difficulty waking up and sleep apnea, low sats   COPD (chronic obstructive pulmonary disease) (HCC)  Coronary artery disease    Depression    GERD (gastroesophageal reflux disease)    Hypercholesteremia    Hypertension    Hypothyroidism    Personal history of tobacco use, presenting hazards to health 05/06/2015   Personal history of tobacco use, presenting hazards to health 05/06/2015   Pneumonia    hx of   PVC (premature ventricular contraction)    Skin cancer    Sleep apnea    wears CPAP nightly   Trigeminal neuralgia    Trigeminal neuralgia    Past Surgical History:   Procedure Laterality Date   BREAST SURGERY Bilateral 2011   dbl mastectomy   COLONOSCOPY W/ POLYPECTOMY     COLONOSCOPY WITH PROPOFOL N/A 04/20/2020   Procedure: COLONOSCOPY WITH PROPOFOL;  Surgeon: Toledo, Benay Pike, MD;  Location: [facility] ENDOSCOPY;  Service: Gastroenterology;  Laterality: N/A;   CRANIECTOMY Right 01/21/2016   Procedure: Microvascular Decompression - right trigemminal nerve;  Surgeon: [name], MD;  Location: [place] [facility];  Service: Neurosurgery;  Laterality: Right;   CRANIOTOMY Right 02/03/2016   Procedure: REPAIR OF CEREBROSPINAL FLUID LEAK AND HARVEST ABDOMINAL FAT GRAFT;  Surgeon: [name], MD;  Location: [place] [facility];  Service: Neurosurgery;  Laterality: Right;   ESOPHAGOGASTRODUODENOSCOPY N/A 04/20/2020   Procedure: ESOPHAGOGASTRODUODENOSCOPY (EGD);  Surgeon: Toledo, Benay Pike, MD;  Location: [facility] ENDOSCOPY;  Service: Gastroenterology;  Laterality: N/A;   LUMBAR LAMINECTOMY/DECOMPRESSION MICRODISCECTOMY Left 08/11/2015   Procedure: Left Lumbar four-five microdiscectomy;  Surgeon: [name], MD;  Location: [place] [facility];  Service: Neurosurgery;  Laterality: Left;   MASTECTOMY  05/2009   bilateral, reconstructive surgery 09/2009   MASTECTOMY     Bilateral    repair ectropion extensive Bilateral    skin cancer removal     Family History  Problem Relation Age of Onset   Mental illness Mother    Diabetes Mother    Hypertension Mother    Hyperlipidemia Mother    Alzheimer's disease Mother    Hypertension Father    Hyperlipidemia Father    Stroke Father    Heart attack Father    Diabetes Father    Deep vein thrombosis Father    Colon cancer Father    Cancer Brother        Lung   Diabetes Brother    Diabetes Brother    Stroke Maternal Grandmother    Social History   Socioeconomic History   Marital status: Widowed    Spouse name: Not on file   Number of children: Not on file   Years of education: Not on file   Highest education  level: Not on file  Occupational History   Not on file  Tobacco Use   Smoking status: Every Day    Packs/day: 1.00    Years: 54.00    Pack years: 54.00    Types: Cigarettes   Smokeless tobacco: Never   Tobacco comments:    Refused cessation material  Vaping Use   Vaping Use: Not on file  Substance and Sexual Activity   Alcohol use: No   Drug use: No   Sexual activity: Never  Other Topics Concern   Not on file  Social History Narrative   Lives with mother.   Recently widowed.   Always uses seat belts   Has a bird and a dog.   No exercise.   Social Determinants of Health   Financial Resource Strain: Medium Risk   Difficulty of Paying Living Expenses: Somewhat hard  Food Insecurity: No  Food Insecurity   Worried About Charity fundraiser in the Last Year: Never true   Ran Out of Food in the Last Year: Never true  Transportation Needs: No Transportation Needs   Lack of Transportation (Medical): No   Lack of Transportation (Non-Medical): No  Physical Activity: Unknown   Days of Exercise per Week: 0 days   Minutes of Exercise per Session: Not on file  Stress: No Stress Concern Present   Feeling of Stress : Not at all  Social Connections: Unknown   Frequency of Communication with Friends and Family: More than three times a week   Frequency of Social Gatherings with Friends and Family: Not on file   Attends Religious Services: Not on Electrical engineer or Organizations: Not on file   Attends Archivist Meetings: Not on file   Marital Status: Not on file    Tobacco Counseling Ready to quit: Not Answered Counseling given: Not Answered Tobacco comments: Refused cessation material   Clinical Intake:  Pre-visit preparation completed: Yes       Financial Strains and Diabetes Management: Is the patient seen by Chronic Care Management for management of their diabetes?  Yes   How often do you need to have someone help you when you read  instructions, pamphlets, or other written materials from your doctor or pharmacy?: 1 - Never   Interpreter Needed?: No      Activities of Daily Living In your present state of health, do you have any difficulty performing the following activities: 04/01/2021  Hearing? N  Vision? N  Difficulty concentrating or making decisions? (No Data)  Comment Hx of cognitive changes. Manages her own medication and finance.  Walking or climbing stairs? N  Dressing or bathing? N  Doing errands, shopping? N  Preparing Food and eating ? N  Using the Toilet? N  In the past six months, have you accidently leaked urine? N  Do you have problems with loss of bowel control? N  Managing your Medications? N  Managing your Finances? N  Housekeeping or managing your Housekeeping? N  Some recent data might be hidden    Patient Care Team: [name], MD as PCP - General (Internal Medicine) [name], MD (Unknown Physician Specialty) [name], RPH-CPP (Pharmacist)  Indicate any recent Medical Services you may have received from other than [facility] providers in the past year (date may be approximate).     Assessment:   This is a routine wellness examination for Luca.  I connected with Dari today by telephone and verified that I am speaking with the correct person using two identifiers. Location patient: home Location provider: work Persons participating in the virtual visit: patient, Marine scientist.    I discussed the limitations, risks, security and privacy concerns of performing an evaluation and management service by telephone and the availability of in person appointments. The patient expressed understanding and verbally consented to this telephonic visit.    Interactive audio and video telecommunications were attempted between this provider and patient, however failed, due to patient having technical difficulties OR patient did not have access to video capability.  We continued and  completed visit with audio only.  Some vital signs may be absent or patient reported.   Hearing/Vision screen Hearing Screening - Comments:: Patient is able to hear conversational tones without difficulty.  No issues reported. Vision Screening - Comments:: [facility] [place]  No retinopathy reported  They have seen their  ophthalmologist in the last 12 months  Dietary issues and exercise activities discussed: Current Exercise Habits: Home exercise routine, Type of exercise: walking, Intensity: Mild Low carb diet Good water intake   Goals Addressed             This Visit's Progress    Quit Smoking   On track    Decrease the amount of cigarettes smoked        Depression Screen PHQ 2/9 Scores 04/01/2021 03/10/2021 10/06/2020 03/31/2020 03/21/2019 03/15/2018 12/06/2016  PHQ - 2 Score 0 2 0 0 0 0 0  PHQ- 9 Score 0 10 - - - - -    Fall Risk Fall Risk  04/01/2021 03/10/2021 01/28/2021 10/06/2020 07/08/2020  Falls in the past year? 0 0 0 0 0  Number falls in past yr: 0 0 - 0 -  Injury with Fall? - 0 - 0 -  Follow up _0     FALL RISK PREVENTION PERTAINING TO THE HOME: Adequate lighting in your home to reduce risk of falls? Yes   ASSISTIVE DEVICES UTILIZED TO PREVENT FALLS: Use of a cane, walker or w/c? No   TIMED UP AND GO: Was the test performed? No .   Cognitive Function:  Patient is alert and oriented x3.    6CIT Screen 04/01/2021 03/31/2020 03/21/2019 03/15/2018 12/06/2016  What Year? 0 points 0 points 0 points 0 points 0 points  What month? 0 points 0 points 0 points 0 points 0 points  What time? 0 points - 0 points 0 points 0 points  Count back from 20 0 points 0 points 0 points 0 points 0 points  Months in reverse 0 points 0 points 0 points 0 points 0 points  Repeat phrase 0 points 0 points 0 points 0 points 0 points  Total Score  0 - 0 0 0    Immunizations Immunization History  Administered Date(s) Administered   Fluad Quad(high Dose 65+) 04/10/2019, 04/08/2020   Influenza, High Dose Seasonal PF 06/09/2017, 03/15/2018   Influenza,inj,Quad PF,6+ Mos 09/05/2016   PFIZER(Purple Top)SARS-COV-2 Vaccination 02/19/2020, 03/13/2020   Pneumococcal Conjugate-13 11/25/2013   Pneumococcal Polysaccharide-23 11/26/2006, 07/03/2015   Tdap 04/13/2018   Shingrix vaccine- Due, Education has been provided regarding the importance of this vaccine. Advised may receive this vaccine at local pharmacy or Health Dept. Aware to provide a copy of the vaccination record if obtained from local pharmacy or Health Dept. Verbalized acceptance and understanding.  Health Maintenance Health Maintenance  Topic Date Due   COVID-19 Vaccine (3 - Pfizer risk series) 04/17/2021 (Originally 04/10/2020)   Zoster Vaccines- Shingrix (1 of 2) 07/01/2021 (Originally 06/08/1965)   INFLUENZA VACCINE  10/08/2021 (Originally 02/08/2021)   FOOT EXAM  04/08/2021   OPHTHALMOLOGY EXAM  05/08/2021   HEMOGLOBIN A1C  07/31/2021   COLONOSCOPY (Pts 45-39yr Insurance coverage will need to be confirmed)  04/20/2025   TETANUS/TDAP  04/13/2028   DEXA SCAN  Completed   Hepatitis C Screening  Completed   HPV VACCINES  Aged Out   Health Maintenance There are no preventive care reminders to display for this patient.  Lung Cancer Screening: Completed 06/24/20.  Vision Screening: Recommended annual ophthalmology exams for early detection of glaucoma and other disorders of the eye.  Dental Screening: Recommended annual dental exams for proper oral hygiene  Community Resource Referral / Chronic Care Management: CRR required this visit?  No   CCM required this visit?  No      Plan:   Keep all routine maintenance appointments.   I have personally reviewed and noted the following in the patient's chart:   Medical and social history Use of alcohol, tobacco or  illicit drugs  Current medications and supplements including opioid prescriptions. Not taking opioid.  Functional ability and status Nutritional status Physical activity Advanced directives List of other physicians Hospitalizations, surgeries, and ER visits in previous 12 months Vitals Screenings to include cognitive, depression, and falls Referrals and appointments  In addition, I have reviewed and discussed with patient certain preventive protocols, quality metrics, and best practice recommendations. A written personalized care plan for preventive services as well as general preventive health recommendations were provided to patient via mychart.     OBrien-Blaney, Gavriella Hearst L, LPN   11/25/3356    I have reviewed the above information and agree with above.   Deborra Medina, MD

## 2021-04-01 NOTE — Telephone Encounter (Signed)
Patient complaint of reoccurring nerve pain, R side jaw. Onset 2 weeks ago. Requests referral. Patient also notes she is awaiting referral to nephrology and has not been contacted. Follow up appointment scheduled with PCP 10/14 at 4:00 in office. Patient okay to reschedule if another appointment comes available.

## 2021-04-02 NOTE — Telephone Encounter (Signed)
Patient made aware. Notes she was contacted by Nephrology and scheduled.

## 2021-04-09 DIAGNOSIS — F3341 Major depressive disorder, recurrent, in partial remission: Secondary | ICD-10-CM | POA: Diagnosis not present

## 2021-04-09 DIAGNOSIS — J431 Panlobular emphysema: Secondary | ICD-10-CM

## 2021-04-09 DIAGNOSIS — E1122 Type 2 diabetes mellitus with diabetic chronic kidney disease: Secondary | ICD-10-CM

## 2021-04-09 DIAGNOSIS — N182 Chronic kidney disease, stage 2 (mild): Secondary | ICD-10-CM

## 2021-04-15 ENCOUNTER — Other Ambulatory Visit: Payer: Self-pay | Admitting: Internal Medicine

## 2021-04-23 ENCOUNTER — Ambulatory Visit: Payer: Medicare Other | Admitting: Internal Medicine

## 2021-04-28 ENCOUNTER — Ambulatory Visit (INDEPENDENT_AMBULATORY_CARE_PROVIDER_SITE_OTHER): Payer: Medicare Other | Admitting: Pharmacist

## 2021-04-28 DIAGNOSIS — I2510 Atherosclerotic heart disease of native coronary artery without angina pectoris: Secondary | ICD-10-CM

## 2021-04-28 DIAGNOSIS — I2584 Coronary atherosclerosis due to calcified coronary lesion: Secondary | ICD-10-CM

## 2021-04-28 DIAGNOSIS — N182 Chronic kidney disease, stage 2 (mild): Secondary | ICD-10-CM

## 2021-04-28 DIAGNOSIS — Z720 Tobacco use: Secondary | ICD-10-CM

## 2021-04-28 MED ORDER — NICOTINE 7 MG/24HR TD PT24: 7.0000 mg | MEDICATED_PATCH | Freq: Every day | TRANSDERMAL | 2 refills | Status: DC

## 2021-04-28 MED ORDER — NICOTINE POLACRILEX 4 MG MT GUM: 4.0000 mg | CHEWING_GUM | OROMUCOSAL | 2 refills | Status: DC | PRN

## 2021-04-28 MED ORDER — NICOTINE 14 MG/24HR TD PT24: 14.0000 mg | MEDICATED_PATCH | Freq: Every day | TRANSDERMAL | 2 refills | Status: DC

## 2021-04-28 NOTE — Chronic Care Management (AMB) (Signed)
Chronic Care Management Pharmacy Note  04/28/2021 Name:  Crystal Meyers MRN:  [id number] DOB:  06-26-1946  Subjective: Crystal Meyers is an 75 y.o. year old female who is a primary patient of Tullo, Aris Everts, MD.  The CCM team was consulted for assistance with disease management and care coordination needs.    Engaged with patient by telephone for follow up visit in response to provider referral for pharmacy case management and/or care coordination services.   Consent to Services:  The patient was given information about Chronic Care Management services, agreed to services, and gave verbal consent prior to initiation of services.  Please see initial visit note for detailed documentation.   Patient Care Team: [name], MD as PCP - General (Internal Medicine) [name], MD (Unknown Physician Specialty) [name], RPH-CPP (Pharmacist)    Objective:  Lab Results  Component Value Date   CREATININE 1.07 01/28/2021   CREATININE 1.06 (H) 06/08/2020   CREATININE 0.98 04/08/2020    Lab Results  Component Value Date   HGBA1C 6.4 01/28/2021   Last diabetic Eye exam:  Lab Results  Component Value Date/Time   HMDIABEYEEXA No Retinopathy 05/08/2020 12:00 AM    Last diabetic Foot exam: No results found for: HMDIABFOOTEX      Component Value Date/Time   CHOL 147 01/28/2021 1318   TRIG 262.0 (H) 01/28/2021 1318   HDL 52.50 01/28/2021 1318   CHOLHDL 3 01/28/2021 1318   VLDL 52.4 (H) 01/28/2021 1318   LDLCALC 47 04/08/2020 1051   LDLDIRECT 66.0 01/28/2021 1318    Hepatic Function Latest Ref Rng & Units 01/28/2021 06/08/2020 04/08/2020  Total Protein 6.0 - 8.3 g/dL 6.5 7.4 6.5  Albumin 3.5 - 5.2 g/dL 4.1 4.1 4.2  AST 0 - 37 U/L _0 ALT 0 - 35 U/L _1 Alk Phosphatase 39 - 117 U/L 43 40 41  Total Bilirubin 0.2 - 1.2 mg/dL 0.3 0.4 0.4    Lab Results  Component Value Date/Time   TSH 1.65 01/28/2021 01:18 PM   TSH 2.41 04/08/2020 10:51 AM     CBC Latest Ref Rng & Units 01/28/2021 06/08/2020 04/15/2019  WBC 4.0 - 10.5 K/uL 7.2 7.6 7.9  Hemoglobin 12.0 - 15.0 g/dL 12.5 13.9 13.2  Hematocrit 36.0 - 46.0 % 36.4 41.4 38.9  Platelets 150.0 - 400.0 K/uL 226.0 232 274    Lab Results  Component Value Date/Time   VD25OH 36.98 12/06/2016 02:11 PM   VD25OH 29.29 (L) 08/31/2016 10:54 AM   Social History   Tobacco Use  Smoking Status Every Day   Packs/day: 1.00   Years: 54.00   Pack years: 54.00   Types: Cigarettes  Smokeless Tobacco Never  Tobacco Comments   Refused cessation material   BP Readings from Last 3 Encounters:  03/10/21 124/70  01/28/21 118/68  10/06/20 134/74   Pulse Readings from Last 3 Encounters:  03/10/21 95  01/28/21 87  10/06/20 85   Wt Readings from Last 3 Encounters:  04/01/21 183 lb (83 kg)  03/10/21 183 lb 12.8 oz (83.4 kg)  01/28/21 184 lb 3.2 oz (83.6 kg)    Assessment: Review of patient past medical history, allergies, medications, health status, including review of consultants reports, laboratory and other test data, was performed as part of comprehensive evaluation and provision of chronic care management services.   SDOH:  (Social Determinants of Health) assessments and interventions performed:    [place]  Allergies  Allergen Reactions   Lamisil [Terbinafine] Rash   No Known Allergies Rash    Medications Reviewed Today     Reviewed by Dia Crawford, LPN (Licensed Practical Nurse) on 04/01/21 at 0947  Med List Status: <None>   Medication Order Taking? Sig Documenting Provider Last Dose Status Informant  Accu-Chek Softclix Lancets lancets [id number] No Use to check sugar once daily. Dx: E11.9 [name], MD Taking Active   aspirin EC 81 MG tablet [id number] No Take 1 tablet (81 mg total) by mouth daily. [name], MD Taking Active   atorvastatin (LIPITOR) 40 MG tablet [id number] No TAKE 1 TABLET BY MOUTH EVERY DAY [name], MD Taking Active    blood glucose meter kit and supplies [id number] No Dispense based on patient and insurance preference. Use to check sugar once daily as directed. (FOR ICD-10 E10.9, E11.9). [name], MD Taking Active   dapagliflozin propanediol (FARXIGA) 10 MG TABS tablet [id number] No Take 1 tablet (10 mg total) by mouth daily before breakfast. [name], MD Taking Active   levothyroxine (SYNTHROID) 88 MCG tablet [id number] No TAKE 1 TABLET (88 MCG TOTAL) BY MOUTH DAILY BEFORE BREAKFAST. [name], MD Taking Active   losartan (COZAAR) 100 MG tablet [id number] No TAKE 1 TABLET BY MOUTH EVERYDAY AT BEDTIME [name], MD Taking Active   Multiple Vitamin (MULTIVITAMIN) tablet [id number] No Take 1 tablet by mouth daily. [provider] Taking Active   nitroGLYCERIN (NITROSTAT) 0.4 MG SL tablet [id number] No Place under the tongue.  Patient not taking: Reported on 03/19/2021   [provider] Not Taking Active   omeprazole (PRILOSEC) 40 MG capsule [id number] No TAKE 1 CAPSULE BY MOUTH EVERY DAY [name], MD Taking Active   [facility] VERIO test strip [id number] No USE TO CHECK SUGAR ONCE DAILY E11.9 (VERIO PER PT) [name], MD Taking Active   rOPINIRole (REQUIP) 0.25 MG tablet [id number] No Take 0.25 mg by mouth at bedtime as needed. [provider] Taking Active   traZODone (DESYREL) 100 MG tablet [id number] No Take 1.5 tablets (150 mg total) by mouth at bedtime. [name], MD Taking Active   venlafaxine XR (EFFEXOR-XR) 150 MG 24 hr capsule [id number] No Take 150 mg by mouth daily with breakfast. [provider] Taking Active             Patient Active Problem List   Diagnosis Date Noted   Type 2 diabetes mellitus with diabetic chronic kidney disease ([place]) 01/31/2021   Major depressive disorder, recurrent episode with melancholic features ([place]) 39/76/7341   Cognitive changes 01/31/2021   Headache, new daily persistent ([facility]) 01/31/2021    Counseling on substance use and abuse 01/31/2021   Aortic atherosclerosis ([place]) 07/10/2020   Tubular adenoma of colon 04/21/2020   Family history of colon cancer requiring screening colonoscopy 04/09/2020   Ingrown nail of fifth toe of left foot 09/02/2019   Hyperlipidemia associated with type 2 diabetes mellitus ([place]) 12/25/2018   History of DVT (deep vein thrombosis) 03/22/2018   Leg edema, left 03/18/2018   Restless legs syndrome 09/14/2017   Vasomotor flushing 03/09/2017   Chronic venous insufficiency 12/29/2016   Lymphedema 12/29/2016   History of breast cancer in female 04/14/2016   Trigeminal neuralgia of right side of face 01/21/2016   HNP (herniated nucleus pulposus), lumbar 08/11/2015   Sciatica neuralgia 07/06/2015   Coronary artery disease due to calcified coronary lesion 06/12/2015   Essential hypertension  06/12/2015   Edema 06/12/2015   Personal history of tobacco use, presenting hazards to health 05/06/2015   Arthritis 04/05/2015   CAFL (chronic airflow limitation) ([place]) 04/05/2015   Hypothyroidism 02/10/2015   Multiple pulmonary nodules 10/15/2014   Generalized anxiety disorder 05/18/2014   Nicotine addiction 02/12/2014   Overweight (BMI 25.0-29.9) 08/28/2012   Routine general medical examination at a health care facility 08/27/2012   Barrett's esophagus 08/27/2012   OSA on CPAP 10/14/2011   COPD (chronic obstructive pulmonary disease) with emphysema ([place]) 07/27/2011   Tobacco abuse 07/27/2011   Tobacco abuse counseling 07/27/2011   Depression 07/27/2011   Current tobacco use 07/27/2011    Immunization History  Administered Date(s) Administered   Fluad Quad(high Dose 65+) 04/10/2019, 04/08/2020   Influenza, High Dose Seasonal PF 06/09/2017, 03/15/2018   Influenza,inj,Quad PF,6+ Mos 09/05/2016   PFIZER(Purple Top)SARS-COV-2 Vaccination 02/19/2020, 03/13/2020   Pneumococcal Conjugate-13 11/25/2013   Pneumococcal Polysaccharide-23 11/26/2006, 07/03/2015    Tdap 04/13/2018    Conditions to be addressed/monitored: CAD, DMII, and CKD  Care Plan : Medication Management  Updates made by [name], RPH-CPP since 04/28/2021 12:00 AM     Problem: Diabetes, CAD, CKD      Long-Range Goal: Disease Progression Prevention   Start Date: 02/17/2021  Recent Progress: On track  Priority: High  Note:   Current Barriers:  Unable to independently afford treatment regimen Suboptimal therapeutic regimen for cardiovascular risk reduction  Pharmacist Clinical Goal(s):  Over the next 90 days, patient will verbalize ability to afford treatment regimen through collaboration with PharmD and provider.    Interventions: 1:1 collaboration with [name], MD regarding development and update of comprehensive plan of care as evidenced by provider attestation and co-signature Inter-disciplinary care team collaboration (see longitudinal plan of care) Comprehensive medication review performed; medication list updated in electronic medical record  Health Maintenance Yearly diabetic eye exam: up to date Yearly diabetic foot exam: up to date Urine microalbumin: up to date Yearly influenza vaccination: due - recommended to pursue vaccination Td/Tdap vaccination: up to date Pneumonia vaccination: up to date COVID vaccinations: due - for bivalent booster. Recommended to pursue Shingrix vaccinations: due  - will discuss moving forward  Colonoscopy: due  Diabetes: Controlled per last A1c; current treatment: Farxiga 10 mg daily  Previously on metformin. Decided to hold when starting Farxiga and follow A1c for need to restart Approved for [place] assistance through [employer] through 07/10/21 Current blood sugars: 115 yesterday Will collaborate w/ CPhT, patient, and providers to reapply for patient assistance for Farxiga for 2023. Praised for continued focus on glucose control. Continue current regimen at this time.   Hypertension in the setting of  CKD: Controlled per home readings; current treatment: losartan 100 mg daily Current home readings: 110s/60-70s Denies any issues with hypotensive symptoms Previously recommended to continue current regimen.   Hyperlipidemia/CAD: Controlled; current treatment: atorvastatin 40 mg daily;  Antiplatelet regimen: aspirin 81 mg daily Recommended to continue current regimen at this time  Depression/Insomnia/RLS: Improved per patient report; current regimen: venlafaxine 150 mg daily, trazodone 150 mg QPM, ropinirole 0.25 mg PRN - reports she doesn't generally use Bupropion previously prescribed, but was costly on her insurance plan.  Previously discussed good sleep hygiene principals. Recommended to continue current regimen at this time  Tobacco Abuse: 1 packs per day; 60 years of use; does smoke within 30 minutes of waking up; started on nicotine 21 mg patches ~ 3 weeks ago, also using nicotine 4 mg gum, about 8  pieces daily. Has been tobacco free for almost a month. Likely that recent reported jaw pain is related to gum use, but patient is not concerned.  Quit Date: 03/31/21 Previous quit attempts: unsuccessful using bupropion (insomnia), varenicline (reports insomnia, lack of benefit); reports she has looked into nicotine replacement therapy but cost was too expensive; does report she was able to quit for a year when she had breast cancer. Denies issues with snacking cravings. Even wonders if she could go without patches Encouraged to continue at least 6 weeks of nicotine 21 mg patches + nicotine gum, then step down to 14 mg for 2 weeks, then 7 mg for 2 weeks, then stop. Can continue nicotine 4 mg gum (can even split gum in half) PRN. Praised for being able to quit.  Appears that [facility] through [facility] is the most cost effective location to receive NRT. Scripts sent and GoodRx coupons sent to patient.   Hypothyroidism: Controlled per last lab work; current regimen: levothyroxine 88 mcg  daily Previously recommended to continue current regimen at this time  GERD/Barrett's Esophagus: Controlled; current regimen: omeprazole 40 mg daily Previously recommended that she continue current regimen  Patient Goals/Self-Care Activities Over the next 90 days, patient will:  - take medications as prescribed check blood pressure periodically, document, and provide at future appointments collaborate with provider on medication access solutions  Follow Up Plan: Telephone follow up appointment with care management team member scheduled for: ~ 6 weeks      Medication Assistance:  Wilder Glade obtained through [employer] medication assistance program.  Enrollment ends 07/10/21  Patient's preferred pharmacy is:  [id number] [place] [place]Phone: [phone]: [phone] [facility] [address]Phone: [phone]: [phone] [id number] [place] [place]: [phone]: [phone]  Follow Up:  Patient agrees to Care Plan and Follow-up.  Plan: Telephone follow up appointment with care management team member scheduled for:  6 weeks  Catie TDarnelle Maffucci PharmD, [place] CPearsonClinical Pharmacist [facility] [phone]

## 2021-04-28 NOTE — Patient Instructions (Signed)
Crystal Meyers,   CONGRATULATIONS ON QUITTING SMOKING!!!!!!!! We are so proud of you.   I recommend you complete at least 4-6 weeks of nicotine 21 mg patches. Then, step down to 14 mg patches for at least 2 weeks. Then, step down to 7 mg patches for at least 2 weeks. Continue using the nicotine 4 mg gum - you can cut the piece of gum in half to be a 2 mg dose, if you realize you don't need a whole 4 mg dose at a time to satisfy cravings.   We know that use of both nicotine patches + gum together has been shown to improve chances of quitting and remaining quit.   We recommend you get the influenza vaccine for this season.   We recommend you get the updated bivalent COVID-19 booster, at least 2 months after any prior doses. You may consider delaying a booster dose by 3 months from a prior episode of COVID-19 per the CDC.   You can find pharmacies that have this formulation in stock at [email or web address].   Crystal Meyers, PharmD  Visit Information  PATIENT GOALS:  Goals Addressed               This Visit's Progress     Patient Stated     Medication Monitoring (pt-stated)        Patient Goals/Self-Care Activities Over the next 90 days, patient will:  - take medications as prescribed check blood pressure periodically, document, and provide at future appointments collaborate with provider on medication access solutions         Patient verbalizes understanding of instructions provided today and agrees to view in [place].   Plan: Telephone follow up appointment with care management team member scheduled for:  6 weeks  Crystal Meyers, PharmD, [place], [place] Clinical Pharmacist [employer] at [employer] [phone]

## 2021-05-01 ENCOUNTER — Other Ambulatory Visit: Payer: Self-pay | Admitting: Internal Medicine

## 2021-05-07 ENCOUNTER — Other Ambulatory Visit: Payer: Self-pay

## 2021-05-07 ENCOUNTER — Other Ambulatory Visit (INDEPENDENT_AMBULATORY_CARE_PROVIDER_SITE_OTHER): Payer: Medicare Other

## 2021-05-07 DIAGNOSIS — N182 Chronic kidney disease, stage 2 (mild): Secondary | ICD-10-CM | POA: Diagnosis not present

## 2021-05-07 DIAGNOSIS — E1122 Type 2 diabetes mellitus with diabetic chronic kidney disease: Secondary | ICD-10-CM

## 2021-05-07 LAB — COMPREHENSIVE METABOLIC PANEL
ALT: 27 U/L (ref 0–35)
AST: 27 U/L (ref 0–37)
Albumin: 4.4 g/dL (ref 3.5–5.2)
Alkaline Phosphatase: 40 U/L (ref 39–117)
BUN: 28 mg/dL — ABNORMAL HIGH (ref 6–23)
CO2: 30 mEq/L (ref 19–32)
Calcium: 10 mg/dL (ref 8.4–10.5)
Chloride: 101 mEq/L (ref 96–112)
Creatinine, Ser: 1.2 mg/dL (ref 0.40–1.20)
GFR: 45.09 mL/min — ABNORMAL LOW (ref >60.00)
Glucose, Bld: 104 mg/dL — ABNORMAL HIGH (ref 70–99)
Potassium: 4.4 mEq/L (ref 3.5–5.1)
Sodium: 140 mEq/L (ref 135–145)
Total Bilirubin: 0.4 mg/dL (ref 0.2–1.2)
Total Protein: 6.8 g/dL (ref 6.0–8.3)

## 2021-05-07 LAB — LIPID PANEL
Cholesterol: 135 mg/dL (ref 0–200)
HDL: 48.7 mg/dL (ref >39.00)
LDL Cholesterol: 52 mg/dL (ref 0–99)
NonHDL: 85.98
Total CHOL/HDL Ratio: 3
Triglycerides: 169 mg/dL — ABNORMAL HIGH (ref 0.0–149.0)
VLDL: 33.8 mg/dL (ref 0.0–40.0)

## 2021-05-07 LAB — HEMOGLOBIN A1C: Hgb A1c MFr Bld: 6.2 % (ref 4.6–6.5)

## 2021-05-10 DIAGNOSIS — E1122 Type 2 diabetes mellitus with diabetic chronic kidney disease: Secondary | ICD-10-CM

## 2021-05-10 DIAGNOSIS — I2584 Coronary atherosclerosis due to calcified coronary lesion: Secondary | ICD-10-CM

## 2021-05-10 DIAGNOSIS — N182 Chronic kidney disease, stage 2 (mild): Secondary | ICD-10-CM

## 2021-05-10 DIAGNOSIS — I2510 Atherosclerotic heart disease of native coronary artery without angina pectoris: Secondary | ICD-10-CM | POA: Diagnosis not present

## 2021-05-19 ENCOUNTER — Telehealth: Payer: Self-pay | Admitting: Pharmacy Technician

## 2021-05-19 DIAGNOSIS — Z596 Low income: Secondary | ICD-10-CM

## 2021-05-19 NOTE — Progress Notes (Signed)
[facility])                                            [place] Team    05/19/2021  ADRINNE SZE March 11, 1946 [id number]  FOR 2023 RE ENROLLMENT                                      Medication Assistance Referral  Referral From: [name] Catie T.   Medication/Company: Wilder Glade / [place] Patient application portion:  N/A patient should automatically be enrolled in 2023 as patient has Med D per program guidelines Provider application portion:  N/A embedded PharmD to sign in clinic  to Dr. Derrel Nip Provider address/fax verified via: Office website   Gracelee Stemmler P. Arda Daggs, [place]  [phone]

## 2021-05-27 ENCOUNTER — Other Ambulatory Visit (HOSPITAL_COMMUNITY): Payer: Self-pay | Admitting: Nephrology

## 2021-05-27 ENCOUNTER — Other Ambulatory Visit: Payer: Self-pay | Admitting: Nephrology

## 2021-05-27 DIAGNOSIS — R82998 Other abnormal findings in urine: Secondary | ICD-10-CM

## 2021-05-27 DIAGNOSIS — E1122 Type 2 diabetes mellitus with diabetic chronic kidney disease: Secondary | ICD-10-CM | POA: Diagnosis not present

## 2021-05-27 DIAGNOSIS — I10 Essential (primary) hypertension: Secondary | ICD-10-CM

## 2021-05-27 DIAGNOSIS — R8290 Unspecified abnormal findings in urine: Secondary | ICD-10-CM | POA: Diagnosis not present

## 2021-05-27 DIAGNOSIS — G2581 Restless legs syndrome: Secondary | ICD-10-CM | POA: Diagnosis not present

## 2021-05-27 DIAGNOSIS — G4733 Obstructive sleep apnea (adult) (pediatric): Secondary | ICD-10-CM | POA: Diagnosis not present

## 2021-05-27 DIAGNOSIS — E785 Hyperlipidemia, unspecified: Secondary | ICD-10-CM | POA: Diagnosis not present

## 2021-05-27 DIAGNOSIS — J449 Chronic obstructive pulmonary disease, unspecified: Secondary | ICD-10-CM | POA: Diagnosis not present

## 2021-05-27 DIAGNOSIS — F32A Depression, unspecified: Secondary | ICD-10-CM | POA: Diagnosis not present

## 2021-05-27 DIAGNOSIS — N1831 Chronic kidney disease, stage 3a: Secondary | ICD-10-CM | POA: Diagnosis not present

## 2021-06-07 ENCOUNTER — Telehealth: Payer: Self-pay | Admitting: Internal Medicine

## 2021-06-07 ENCOUNTER — Other Ambulatory Visit: Payer: Self-pay | Admitting: *Deleted

## 2021-06-07 DIAGNOSIS — Z170 Estrogen receptor positive status [ER+]: Secondary | ICD-10-CM

## 2021-06-07 NOTE — Telephone Encounter (Signed)
Pt called to reschedule her appt. She has a cold. Please give her a call back at [phone]

## 2021-06-07 NOTE — Telephone Encounter (Signed)
done

## 2021-06-08 ENCOUNTER — Inpatient Hospital Stay: Payer: Medicare Other

## 2021-06-08 ENCOUNTER — Inpatient Hospital Stay: Payer: Medicare Other | Admitting: Nurse Practitioner

## 2021-06-10 ENCOUNTER — Ambulatory Visit (INDEPENDENT_AMBULATORY_CARE_PROVIDER_SITE_OTHER): Payer: Medicare Other | Admitting: Pharmacist

## 2021-06-10 DIAGNOSIS — F411 Generalized anxiety disorder: Secondary | ICD-10-CM

## 2021-06-10 DIAGNOSIS — F3341 Major depressive disorder, recurrent, in partial remission: Secondary | ICD-10-CM

## 2021-06-10 DIAGNOSIS — I2510 Atherosclerotic heart disease of native coronary artery without angina pectoris: Secondary | ICD-10-CM

## 2021-06-10 DIAGNOSIS — E1122 Type 2 diabetes mellitus with diabetic chronic kidney disease: Secondary | ICD-10-CM

## 2021-06-10 DIAGNOSIS — I2584 Coronary atherosclerosis due to calcified coronary lesion: Secondary | ICD-10-CM

## 2021-06-10 DIAGNOSIS — N182 Chronic kidney disease, stage 2 (mild): Secondary | ICD-10-CM

## 2021-06-10 MED ORDER — DAPAGLIFLOZIN PROPANEDIOL 10 MG PO TABS: 10.0000 mg | ORAL_TABLET | Freq: Every day | ORAL | 3 refills | Status: DC

## 2021-06-10 NOTE — Chronic Care Management (AMB) (Signed)
Chronic Care Management CCM Pharmacy Note  06/10/2021 Name:  Crystal Meyers MRN:  [id number] DOB:  04-Jul-1946  Summary: - A1c controlled, blood pressure controlled, tolerating regimen wel  Recommendations/Changes made from today's visit: - Recommended flu vaccine, COVID booster, Shingrix vaccine - Recommended to continue current regimen at this time  Subjective: Crystal Meyers is an 75 y.o. year old female who is a primary patient of Crystal Mc, MD.  The CCM team was consulted for assistance with disease management and care coordination needs.    Engaged with patient by telephone for follow up visit for pharmacy case management and/or care coordination services.   Objective:  Medications Reviewed Today     Reviewed by [name], RPH-CPP (Pharmacist) on 06/10/21 at 1040  Med List Status: <None>   Medication Order Taking? Sig Documenting Provider Last Dose Status Informant  Accu-Chek Softclix Lancets lancets [id number]  Use to check sugar once daily. Dx: E11.9 Crystal Mc, MD  Active   aspirin EC 81 MG tablet [id number] Yes Take 1 tablet (81 mg total) by mouth daily. Crystal Mc, MD Taking Active   atorvastatin (LIPITOR) 40 MG tablet [id number] Yes TAKE 1 TABLET BY MOUTH EVERY DAY Crystal Mc, MD Taking Active   blood glucose meter kit and supplies [id number]  Dispense based on patient and insurance preference. Use to check sugar once daily as directed. (FOR ICD-10 E10.9, E11.9). Crystal Mc, MD  Active   dapagliflozin propanediol (FARXIGA) 10 MG TABS tablet [id number] Yes Take 1 tablet (10 mg total) by mouth daily before breakfast. Crystal Mc, MD Taking Active   levothyroxine (SYNTHROID) 88 MCG tablet [id number] Yes TAKE 1 TABLET (88 MCG TOTAL) BY MOUTH DAILY BEFORE BREAKFAST. Crystal Mc, MD Taking Active   losartan (COZAAR) 100 MG tablet [id number] Yes TAKE 1 TABLET BY MOUTH EVERYDAY AT BEDTIME Crystal Mc, MD Taking Active   Multiple Vitamin  (MULTIVITAMIN) tablet [id number] Yes Take 1 tablet by mouth daily. [provider] Taking Active   nicotine polacrilex (NICORETTE) 4 MG gum [id number] Yes Take 1 each (4 mg total) by mouth as needed for smoking cessation. Crystal Mc, MD Taking Active   nitroGLYCERIN (NITROSTAT) 0.4 MG SL tablet [id number]  Place under the tongue.  Patient not taking: Reported on 03/19/2021   [provider]  Active   omeprazole (PRILOSEC) 40 MG capsule [id number] Yes TAKE 1 CAPSULE BY MOUTH EVERY DAY Crystal Mc, MD Taking Active   [facility] VERIO test strip [id number]  USE TO CHECK SUGAR ONCE DAILY E11.9 (VERIO PER PT) Crystal Mc, MD  Active   rOPINIRole (REQUIP) 0.25 MG tablet [id number] No Take 0.25 mg by mouth at bedtime as needed.  Patient not taking: Reported on 06/10/2021   [provider] Not Taking Active   traZODone (DESYREL) 100 MG tablet [id number] Yes Take 1.5 tablets (150 mg total) by mouth at bedtime. Crystal Mc, MD Taking Active   venlafaxine XR (EFFEXOR-XR) 150 MG 24 hr capsule [id number] Yes Take 150 mg by mouth daily with breakfast. [provider] Taking Active             Pertinent Labs:   Lab Results  Component Value Date   HGBA1C 6.2 05/07/2021   Lab Results  Component Value Date   CHOL 135 05/07/2021   HDL 48.70 05/07/2021   LDLCALC 52 05/07/2021   LDLDIRECT 66.0 01/28/2021   TRIG 169.0 (H) 05/07/2021   CHOLHDL 3  05/07/2021   Lab Results  Component Value Date   CREATININE 1.20 05/07/2021   BUN 28 (H) 05/07/2021   NA 140 05/07/2021   K 4.4 05/07/2021   CL 101 05/07/2021   CO2 30 05/07/2021    SDOH:  (Social Determinants of Health) assessments and interventions performed:  SDOH Interventions    Flowsheet Row Most Recent Value  SDOH Interventions   Financial Strain Interventions Other (Comment)  [manufacturer assistance]       CCM Care Plan  Review of patient past medical history, allergies, medications, health  status, including review of consultants reports, laboratory and other test data, was performed as part of comprehensive evaluation and provision of chronic care management services.   Care Plan : Medication Management  Updates made by [name], RPH-CPP since 06/10/2021 12:00 AM     Problem: Diabetes, CAD, CKD      Long-Range Goal: Disease Progression Prevention   Start Date: 02/17/2021  Recent Progress: On track  Priority: High  Note:   Current Barriers:  Unable to independently afford treatment regimen Suboptimal therapeutic regimen for cardiovascular risk reduction  Pharmacist Clinical Goal(s):  Over the next 90 days, patient will verbalize ability to afford treatment regimen through collaboration with PharmD and provider.   Interventions: 1:1 collaboration with Crystal Mc, MD regarding development and update of comprehensive plan of care as evidenced by provider attestation and co-signature Inter-disciplinary care team collaboration (see longitudinal plan of care) Comprehensive medication review performed; medication list updated in electronic medical record  Health Maintenance Yearly diabetic eye exam: up to date Yearly diabetic foot exam: due - placed reminder in upcoming appointment notes Urine microalbumin: up to date Yearly influenza vaccination: due - recommended to pursue vaccination. She plans to get at next PCP visit Td/Tdap vaccination: up to date Pneumonia vaccination: up to date COVID vaccinations: due - for bivalent booster. Recommended to pursue. Shingrix vaccinations: due  - recommended to pursue in 2023.  Colonoscopy: due  Diabetes: Controlled; current treatment: Farxiga 10 mg daily  Avoiding metformin d/t renal disease, though could restart low dose pending renal function moving forward as needed Approved for [place] assistance through [employer] through 07/10/21 Current blood sugars: 110s Refill needed to [employer] for [place] for  2023. Script sent today Praised for continued focus on glucose control. Continue current regimen at this time.   Hypertension in the setting of CKD: Controlled per home readings; current treatment: losartan 100 mg daily Current home readings: 110s/60-70s Very concerned about dietary recommendations for a kidney diet. Afraid of eating things that will hurt her kidneys. Providing information today from NIDDK that talks about appropriate choices, rather than lists of "bad foods".   Hyperlipidemia/CAD: Controlled; current treatment: atorvastatin 40 mg daily;  Antiplatelet regimen: aspirin 81 mg daily Recommended to continue current regimen at this time  Depression/Insomnia/RLS: Improved per patient report; current regimen: venlafaxine 150 mg daily, trazodone 150 mg QPM, ropinirole 0.25 mg PRN - reports she doesn't generally use Bupropion previously prescribed, but was costly on her insurance plan.  Previously discussed good sleep hygiene principals. Recommended to continue current regimen at this time  Tobacco Abuse: Quit Date: 03/31/21; Reports she is using the nicotine gum, about 10 pieces per day.  Previous quit attempts: unsuccessful using bupropion (insomnia), varenicline (reports insomnia, lack of benefit); reports she has looked into nicotine replacement therapy but cost was too expensive; does report she was able to quit for a year when she had breast cancer. Denies issues with  cravings or being around other people that are smoking.  Praised patient for remaining tobacco free.   Hypothyroidism: Controlled per last lab work; current regimen: levothyroxine 88 mcg daily Previously recommended to continue current regimen at this time  GERD/Barrett's Esophagus: Controlled; current regimen: omeprazole 40 mg daily Previously recommended that she continue current regimen  Patient Goals/Self-Care Activities Over the next 90 days, patient will:  - take medications as prescribed check  blood pressure periodically, document, and provide at future appointments collaborate with provider on medication access solutions      Plan: Telephone follow up appointment with care management team member scheduled for:  3 months  [name], PharmD, [place], [place] Clinical Pharmacist [employer] at [employer] [phone]

## 2021-06-10 NOTE — Patient Instructions (Signed)
Crystal Meyers,   It was great talking to you today!  We recommend you get the influenza vaccine for this season.   We recommend you get the updated bivalent COVID-19 booster, at least 2 months after any prior doses. You may consider delaying a booster dose by 3 months from a prior episode of COVID-19 per the CDC.   You can find pharmacies that have this formulation in stock at [email or web address].   We recommend that everyone over the age of 10 get the Shingrix (shingles) vaccine. There will be a $0 copay for this at your pharmacy starting in 2023.   Keep up the great work!  [name], PharmD  Visit Information  Following are the goals we discussed today:  Patient Goals/Self-Care Activities Over the next 90 days, patient will:  - take medications as prescribed check blood pressure periodically, document, and provide at future appointments collaborate with provider on medication access solutions        Plan: Telephone follow up appointment with care management team member scheduled for:  3 months   [name], PharmD, [place], CPP Clinical Pharmacist [facility] [phone]     Please call the care guide team at [phone] if you need to cancel or reschedule your appointment.   Patient verbalizes understanding of instructions provided today and agrees to view in [place].

## 2021-06-14 ENCOUNTER — Telehealth: Payer: Self-pay | Admitting: Pharmacy Technician

## 2021-06-14 ENCOUNTER — Other Ambulatory Visit: Payer: Self-pay

## 2021-06-14 ENCOUNTER — Ambulatory Visit
Admission: RE | Admit: 2021-06-14 | Discharge: 2021-06-14 | Disposition: A | Discharge status: Home / Self Care | Payer: Medicare Other | Source: Ambulatory Visit | Attending: Nephrology | Admitting: Nephrology

## 2021-06-14 DIAGNOSIS — I10 Essential (primary) hypertension: Secondary | ICD-10-CM | POA: Insufficient documentation

## 2021-06-14 DIAGNOSIS — E1122 Type 2 diabetes mellitus with diabetic chronic kidney disease: Secondary | ICD-10-CM | POA: Insufficient documentation

## 2021-06-14 DIAGNOSIS — Z596 Low income: Secondary | ICD-10-CM

## 2021-06-14 DIAGNOSIS — R82998 Other abnormal findings in urine: Secondary | ICD-10-CM | POA: Insufficient documentation

## 2021-06-14 DIAGNOSIS — E785 Hyperlipidemia, unspecified: Secondary | ICD-10-CM | POA: Insufficient documentation

## 2021-06-14 DIAGNOSIS — N1831 Chronic kidney disease, stage 3a: Secondary | ICD-10-CM | POA: Insufficient documentation

## 2021-06-14 DIAGNOSIS — N289 Disorder of kidney and ureter, unspecified: Secondary | ICD-10-CM | POA: Diagnosis not present

## 2021-06-14 NOTE — Progress Notes (Signed)
[facility])                                            [place] Team    06/14/2021  Crystal Meyers 29-Jul-1945 [id number]  Received provider portion(s) of prescription for Farxiga. Embedded PharmD escribed completed prescription into [employer].    [name] P. [name], [place]  [phone]

## 2021-06-22 ENCOUNTER — Inpatient Hospital Stay (HOSPITAL_BASED_OUTPATIENT_CLINIC_OR_DEPARTMENT_OTHER): Payer: Medicare Other | Admitting: Nurse Practitioner

## 2021-06-22 ENCOUNTER — Inpatient Hospital Stay: Payer: Medicare Other | Attending: Nurse Practitioner

## 2021-06-22 ENCOUNTER — Other Ambulatory Visit: Payer: Self-pay

## 2021-06-22 ENCOUNTER — Encounter: Payer: Self-pay | Admitting: Nurse Practitioner

## 2021-06-22 VITALS — BP 142/76 | HR 78 | Temp 98.3°F | Resp 20 | Wt 185.0 lb

## 2021-06-22 DIAGNOSIS — Z08 Encounter for follow-up examination after completed treatment for malignant neoplasm: Secondary | ICD-10-CM | POA: Diagnosis not present

## 2021-06-22 DIAGNOSIS — I2510 Atherosclerotic heart disease of native coronary artery without angina pectoris: Secondary | ICD-10-CM | POA: Diagnosis not present

## 2021-06-22 DIAGNOSIS — G4730 Sleep apnea, unspecified: Secondary | ICD-10-CM | POA: Insufficient documentation

## 2021-06-22 DIAGNOSIS — Z801 Family history of malignant neoplasm of trachea, bronchus and lung: Secondary | ICD-10-CM | POA: Diagnosis not present

## 2021-06-22 DIAGNOSIS — Z8719 Personal history of other diseases of the digestive system: Secondary | ICD-10-CM | POA: Insufficient documentation

## 2021-06-22 DIAGNOSIS — Z823 Family history of stroke: Secondary | ICD-10-CM | POA: Diagnosis not present

## 2021-06-22 DIAGNOSIS — Z87891 Personal history of nicotine dependence: Secondary | ICD-10-CM | POA: Insufficient documentation

## 2021-06-22 DIAGNOSIS — Z833 Family history of diabetes mellitus: Secondary | ICD-10-CM | POA: Diagnosis not present

## 2021-06-22 DIAGNOSIS — C50412 Malignant neoplasm of upper-outer quadrant of left female breast: Secondary | ICD-10-CM | POA: Insufficient documentation

## 2021-06-22 DIAGNOSIS — Z818 Family history of other mental and behavioral disorders: Secondary | ICD-10-CM | POA: Insufficient documentation

## 2021-06-22 DIAGNOSIS — Z800 Family history of malignant neoplasm of digestive organs: Secondary | ICD-10-CM | POA: Diagnosis not present

## 2021-06-22 DIAGNOSIS — Z79899 Other long term (current) drug therapy: Secondary | ICD-10-CM | POA: Diagnosis not present

## 2021-06-22 DIAGNOSIS — Z170 Estrogen receptor positive status [ER+]: Secondary | ICD-10-CM | POA: Diagnosis not present

## 2021-06-22 DIAGNOSIS — J449 Chronic obstructive pulmonary disease, unspecified: Secondary | ICD-10-CM | POA: Diagnosis not present

## 2021-06-22 DIAGNOSIS — I10 Essential (primary) hypertension: Secondary | ICD-10-CM | POA: Diagnosis not present

## 2021-06-22 DIAGNOSIS — Z853 Personal history of malignant neoplasm of breast: Secondary | ICD-10-CM | POA: Diagnosis not present

## 2021-06-22 DIAGNOSIS — F419 Anxiety disorder, unspecified: Secondary | ICD-10-CM | POA: Diagnosis not present

## 2021-06-22 DIAGNOSIS — Z8249 Family history of ischemic heart disease and other diseases of the circulatory system: Secondary | ICD-10-CM | POA: Insufficient documentation

## 2021-06-22 DIAGNOSIS — Z85828 Personal history of other malignant neoplasm of skin: Secondary | ICD-10-CM | POA: Insufficient documentation

## 2021-06-22 DIAGNOSIS — Z8349 Family history of other endocrine, nutritional and metabolic diseases: Secondary | ICD-10-CM | POA: Insufficient documentation

## 2021-06-22 DIAGNOSIS — Z883 Allergy status to other anti-infective agents status: Secondary | ICD-10-CM | POA: Insufficient documentation

## 2021-06-22 DIAGNOSIS — E039 Hypothyroidism, unspecified: Secondary | ICD-10-CM | POA: Insufficient documentation

## 2021-06-22 DIAGNOSIS — F32A Depression, unspecified: Secondary | ICD-10-CM | POA: Insufficient documentation

## 2021-06-22 LAB — COMPREHENSIVE METABOLIC PANEL
ALT: 34 U/L (ref 0–44)
AST: 36 U/L (ref 15–41)
Albumin: 4.2 g/dL (ref 3.5–5.0)
Alkaline Phosphatase: 43 U/L (ref 38–126)
Anion gap: 11 (ref 5–15)
BUN: 16 mg/dL (ref 8–23)
CO2: 28 mmol/L (ref 22–32)
Calcium: 9.6 mg/dL (ref 8.9–10.3)
Chloride: 98 mmol/L (ref 98–111)
Creatinine, Ser: 1.18 mg/dL — ABNORMAL HIGH (ref 0.44–1.00)
GFR, Estimated: 49 mL/min — ABNORMAL LOW (ref >60)
Glucose, Bld: 107 mg/dL — ABNORMAL HIGH (ref 70–99)
Potassium: 4.2 mmol/L (ref 3.5–5.1)
Sodium: 137 mmol/L (ref 135–145)
Total Bilirubin: 0.6 mg/dL (ref 0.3–1.2)
Total Protein: 7.3 g/dL (ref 6.5–8.1)

## 2021-06-22 LAB — CBC WITH DIFFERENTIAL/PLATELET
Abs Immature Granulocytes: 0.03 10*3/uL (ref 0.00–0.07)
Basophils Absolute: 0 10*3/uL (ref 0.0–0.1)
Basophils Relative: 1 %
Eosinophils Absolute: 0.1 10*3/uL (ref 0.0–0.5)
Eosinophils Relative: 2 %
HCT: 39.4 % (ref 36.0–46.0)
Hemoglobin: 13.1 g/dL (ref 12.0–15.0)
Immature Granulocytes: 1 %
Lymphocytes Relative: 41 %
Lymphs Abs: 2 10*3/uL (ref 0.7–4.0)
MCH: 29.3 pg (ref 26.0–34.0)
MCHC: 33.2 g/dL (ref 30.0–36.0)
MCV: 88.1 fL (ref 80.0–100.0)
Monocytes Absolute: 0.4 10*3/uL (ref 0.1–1.0)
Monocytes Relative: 8 %
Neutro Abs: 2.3 10*3/uL (ref 1.7–7.7)
Neutrophils Relative %: 47 %
Platelets: 246 10*3/uL (ref 150–400)
RBC: 4.47 MIL/uL (ref 3.87–5.11)
RDW: 13.7 % (ref 11.5–15.5)
WBC: 4.8 10*3/uL (ref 4.0–10.5)
nRBC: 0 % (ref 0.0–0.2)

## 2021-06-22 NOTE — Progress Notes (Signed)
Patient states she was recently dx with stage 3a kidney disease.

## 2021-06-22 NOTE — Progress Notes (Signed)
[place] OFFICE PROGRESS NOTE  Patient Care Team: [name], MD as PCP - General (Internal Medicine) [name], MD (Unknown Physician Specialty) [name], RPH-CPP (Pharmacist)   Cancer Staging  No matching staging information was found for the patient.  Oncology History Overview Note  1. 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 Low Oncotype DX score. Diagnosed in June 2010. 2. Status post-bilateral mastectomy and reconstructive surgery. 3. Started Femara approx. 2011.[name] 4.Breast cancer index revealed a 5.2% increase of late recurrence but low likelihood of benefit with extended adjuvant therapy (March of 2017) Letrozole was discontinued from March of 2017 ---------------------------------------------  DIAGNOSIS: BREAST CANCER  STAGE:   I      ;GOALS: cure   CURRENT/MOST RECENT THERAPY: surveillaince    History of breast cancer in female     INTERVAL HISTORY: Unaccompanied.  Ambulating by herself.  Crystal Meyers 75 y.o.  female pleasant patient with above history of stage IV breast cancer who returns to clinic for annual follow-up.  She completed adjuvant aromatase inhibitor treatment in 2017.  She denies specific complaints and feels well.  Review of Systems  Constitutional:  Negative for chills, diaphoresis, fever, malaise/fatigue and weight loss.  HENT:  Negative for nosebleeds and sore throat.   Eyes:  Negative for double vision.  Respiratory:  Negative for cough, hemoptysis, sputum production, shortness of breath and wheezing.   Cardiovascular:  Negative for chest pain, palpitations, orthopnea and leg swelling.  Gastrointestinal:  Negative for abdominal pain, blood in stool, constipation, diarrhea, heartburn, melena, nausea and vomiting.  Genitourinary:  Negative for dysuria, frequency and urgency.  Musculoskeletal:   Negative for back pain and joint pain.  Skin: Negative.  Negative for itching and rash.  Neurological:  Negative for dizziness, tingling, focal weakness, weakness and headaches.  Endo/Heme/Allergies:  Does not bruise/bleed easily.  Psychiatric/Behavioral:  Negative for depression. The patient is not nervous/anxious and does not have insomnia.     PAST MEDICAL HISTORY :  Past Medical History:  Diagnosis Date   Anxiety    Asthma    Barrett's esophagus    Breast cancer ([place]) 7/10   left , invasive lobular carcinoma   Colon polyps    Complication of anesthesia    difficulty waking up and sleep apnea, low sats   COPD (chronic obstructive pulmonary disease) (HCC)    Coronary artery disease    Depression    GERD (gastroesophageal reflux disease)    Hypercholesteremia    Hypertension    Hypothyroidism    Personal history of tobacco use, presenting hazards to health 05/06/2015   Personal history of tobacco use, presenting hazards to health 05/06/2015   Pneumonia    hx of   PVC (premature ventricular contraction)    Skin cancer    Sleep apnea    wears CPAP nightly   Trigeminal neuralgia    Trigeminal neuralgia     PAST SURGICAL HISTORY :   Past Surgical History:  Procedure Laterality Date   BREAST SURGERY Bilateral 2011   dbl mastectomy   COLONOSCOPY W/ POLYPECTOMY     COLONOSCOPY WITH PROPOFOL N/A 04/20/2020   Procedure: COLONOSCOPY WITH PROPOFOL;  Surgeon: Toledo, Benay Pike, MD;  Location: [facility] ENDOSCOPY;  Service: Gastroenterology;  Laterality: N/A;   CRANIECTOMY Right 01/21/2016   Procedure: Microvascular Decompression - right trigemminal nerve;  Surgeon: [name], MD;  Location: [place] NEURO ORS;  Service:  Neurosurgery;  Laterality: Right;   CRANIOTOMY Right 02/03/2016   Procedure: REPAIR OF CEREBROSPINAL FLUID LEAK AND HARVEST ABDOMINAL FAT GRAFT;  Surgeon: [name], MD;  Location: [place] NEURO ORS;  Service: Neurosurgery;  Laterality: Right;    ESOPHAGOGASTRODUODENOSCOPY N/A 04/20/2020   Procedure: ESOPHAGOGASTRODUODENOSCOPY (EGD);  Surgeon: Toledo, Benay Pike, MD;  Location: [facility] ENDOSCOPY;  Service: Gastroenterology;  Laterality: N/A;   LUMBAR LAMINECTOMY/DECOMPRESSION MICRODISCECTOMY Left 08/11/2015   Procedure: Left Lumbar four-five microdiscectomy;  Surgeon: [name], MD;  Location: [place] NEURO ORS;  Service: Neurosurgery;  Laterality: Left;   MASTECTOMY  05/2009   bilateral, reconstructive surgery 09/2009   MASTECTOMY     Bilateral    repair ectropion extensive Bilateral    skin cancer removal      FAMILY HISTORY :   Family History  Problem Relation Age of Onset   Mental illness Mother    Diabetes Mother    Hypertension Mother    Hyperlipidemia Mother    Alzheimer's disease Mother    Hypertension Father    Hyperlipidemia Father    Stroke Father    Heart attack Father    Diabetes Father    Deep vein thrombosis Father    Colon cancer Father    Cancer Brother        Lung   Diabetes Brother    Diabetes Brother    Stroke Maternal Grandmother     SOCIAL HISTORY:   Social History   Tobacco Use   Smoking status: Former    Packs/day: 1.00    Years: 54.00    Pack years: 54.00    Types: Cigarettes    Quit date: 03/31/2021    Years since quitting: 0.2   Smokeless tobacco: Never   Tobacco comments:    Refused cessation material  Substance Use Topics   Alcohol use: No   Drug use: No    ALLERGIES:  is allergic to lamisil [terbinafine] and no known allergies.  MEDICATIONS:  Current Outpatient Medications  Medication Sig Dispense Refill   Accu-Chek Softclix Lancets lancets Use to check sugar once daily. Dx: E11.9 100 each 3   aspirin EC 81 MG tablet Take 1 tablet (81 mg total) by mouth daily. 90 tablet 1   atorvastatin (LIPITOR) 40 MG tablet TAKE 1 TABLET BY MOUTH EVERY DAY 90 tablet 1   blood glucose meter kit and supplies Dispense based on patient and insurance preference. Use to check sugar once daily  as directed. (FOR ICD-10 E10.9, E11.9). 1 each 0   dapagliflozin propanediol (FARXIGA) 10 MG TABS tablet Take 1 tablet (10 mg total) by mouth daily before breakfast. 90 tablet 3   levothyroxine (SYNTHROID) 88 MCG tablet TAKE 1 TABLET (88 MCG TOTAL) BY MOUTH DAILY BEFORE BREAKFAST. 90 tablet 3   losartan (COZAAR) 100 MG tablet TAKE 1 TABLET BY MOUTH EVERYDAY AT BEDTIME 90 tablet 3   Multiple Vitamin (MULTIVITAMIN) tablet Take 1 tablet by mouth daily.     nicotine polacrilex (NICORETTE) 4 MG gum Take 1 each (4 mg total) by mouth as needed for smoking cessation. 100 tablet 2   omeprazole (PRILOSEC) 40 MG capsule TAKE 1 CAPSULE BY MOUTH EVERY DAY 90 capsule 1   ONETOUCH VERIO test strip USE TO CHECK SUGAR ONCE DAILY E11.9 (VERIO PER PT) 100 strip 3   traZODone (DESYREL) 100 MG tablet Take 1.5 tablets (150 mg total) by mouth at bedtime. 45 tablet 5   venlafaxine XR (EFFEXOR-XR) 150 MG 24 hr capsule Take 150 mg by mouth  daily with breakfast.     nitroGLYCERIN (NITROSTAT) 0.4 MG SL tablet Place under the tongue. (Patient not taking: Reported on 03/19/2021)     rOPINIRole (REQUIP) 0.25 MG tablet Take 0.25 mg by mouth at bedtime as needed. (Patient not taking: Reported on 06/10/2021)     No current facility-administered medications for this visit.    PHYSICAL EXAMINATION: ECOG PERFORMANCE STATUS: 0 - Asymptomatic  BP (!) 142/76    Pulse 78    Temp 98.3 F (36.8 C)    Resp 20    Wt 185 lb (83.9 kg)    SpO2 98%    BMI 30.79 kg/m   Filed Weights   06/22/21 1034  Weight: 185 lb (83.9 kg)   Physical Exam Constitutional:      Appearance: She is not ill-appearing.  Eyes:     General: No scleral icterus.    Conjunctiva/sclera: Conjunctivae normal.  Cardiovascular:     Rate and Rhythm: Normal rate and regular rhythm.  Chest:     Comments: Bilateral mastectomy with reconstruction Abdominal:     General: There is no distension.     Palpations: Abdomen is soft.     Tenderness: There is no abdominal  tenderness. There is no guarding.  Musculoskeletal:        General: No deformity.     Right lower leg: No edema.     Left lower leg: No edema.  Lymphadenopathy:     Cervical: No cervical adenopathy.  Skin:    General: Skin is warm and dry.  Neurological:     Mental Status: She is alert and oriented to person, place, and time. Mental status is at baseline.  Psychiatric:        Mood and Affect: Mood normal.        Behavior: Behavior normal.      LABORATORY DATA:  I have reviewed the data as listed    Component Value Date/Time   NA 137 06/22/2021 1021   NA 136 (A) 09/23/2014 0000   NA 140 03/26/2014 1448   K 4.2 06/22/2021 1021   K 4.0 03/26/2014 1448   CL 98 06/22/2021 1021   CL 104 03/26/2014 1448   CO2 28 06/22/2021 1021   CO2 30 03/26/2014 1448   GLUCOSE 107 (H) 06/22/2021 1021   GLUCOSE 91 03/26/2014 1448   BUN 16 06/22/2021 1021   BUN 14 09/23/2014 0000   BUN 19 (H) 03/26/2014 1448   CREATININE 1.18 (H) 06/22/2021 1021   CREATININE 1.12 03/26/2014 1448   CALCIUM 9.6 06/22/2021 1021   CALCIUM 9.5 03/26/2014 1448   PROT 7.3 06/22/2021 1021   PROT 6.9 03/26/2014 1448   ALBUMIN 4.2 06/22/2021 1021   ALBUMIN 3.7 03/26/2014 1448   AST 36 06/22/2021 1021   AST 23 03/26/2014 1448   ALT 34 06/22/2021 1021   ALT 35 03/26/2014 1448   ALKPHOS 43 06/22/2021 1021   ALKPHOS 45 (L) 03/26/2014 1448   BILITOT 0.6 06/22/2021 1021   BILITOT 0.3 03/26/2014 1448   GFRNONAA 49 (L) 06/22/2021 1021   GFRNONAA 52 (L) 03/26/2014 1448   GFRAA 58 (L) 04/15/2019 1407   GFRAA 60 (L) 03/26/2014 1448    No results found for: SPEP, UPEP  Lab Results  Component Value Date   WBC 7.2 01/28/2021   NEUTROABS 4.0 01/28/2021   HGB 12.5 01/28/2021   HCT 36.4 01/28/2021   MCV 88.4 01/28/2021   PLT 226.0 01/28/2021      Chemistry  Component Value Date/Time   NA 137 06/22/2021 1021   NA 136 (A) 09/23/2014 0000   NA 140 03/26/2014 1448   K 4.2 06/22/2021 1021   K 4.0 03/26/2014  1448   CL 98 06/22/2021 1021   CL 104 03/26/2014 1448   CO2 28 06/22/2021 1021   CO2 30 03/26/2014 1448   BUN 16 06/22/2021 1021   BUN 14 09/23/2014 0000   BUN 19 (H) 03/26/2014 1448   CREATININE 1.18 (H) 06/22/2021 1021   CREATININE 1.12 03/26/2014 1448   GLU 113 09/23/2014 0000      Component Value Date/Time   CALCIUM 9.6 06/22/2021 1021   CALCIUM 9.5 03/26/2014 1448   ALKPHOS 43 06/22/2021 1021   ALKPHOS 45 (L) 03/26/2014 1448   AST 36 06/22/2021 1021   AST 23 03/26/2014 1448   ALT 34 06/22/2021 1021   ALT 35 03/26/2014 1448   BILITOT 0.6 06/22/2021 1021   BILITOT 0.3 03/26/2014 1448       RADIOGRAPHIC STUDIES: I have personally reviewed the radiological images as listed and agreed with the findings in the report. No results found.   ASSESSMENT & PLAN:  No problem-specific Assessment & Plan notes found for this encounter.  History of stage I breast cancer ER/PR positive, left-sided-status postmastectomy bilateral.  Low Oncotype DX score.  Diagnosed June 2010.  Underwent bilateral mastectomy and reconstruction. Completed 6 years of adjuvant aromatase inhibitor use in March 2017.  NED since.  She continues to remain asymptomatic.  She is now greater than 10 years from her diagnosis.  We can release her back to her PCP for continued surveillance.  Recommend annual mammograms.   Low back pain-lumbar x-rays consistent with arthritis.  She does not complain of this today. Bone health-bone mineral density in 2016 was reported as normal.  Recommend she continue calcium and vitamin D as well as weightbearing exercise as tolerated.  She can follow up with pcp regarding ongoing testing in 2023.  Former Smoking-continue to follow with low-dose CT lung cancer screening program.  She has now quit smoking and I applauded her that she is now 3 months quit   DISPOSITION:  Follow-up as needed.  Continue mammograms with PCP  No orders of the defined types were placed in this  encounter.  All questions were answered. The patient knows to call the clinic with any problems, questions or concerns.    Verlon Au, NP 06/22/2021   Dr. Derrel Nip

## 2021-06-23 ENCOUNTER — Other Ambulatory Visit: Payer: Self-pay | Admitting: Internal Medicine

## 2021-06-28 ENCOUNTER — Ambulatory Visit (INDEPENDENT_AMBULATORY_CARE_PROVIDER_SITE_OTHER): Payer: Medicare Other | Admitting: Internal Medicine

## 2021-06-28 ENCOUNTER — Other Ambulatory Visit: Payer: Self-pay

## 2021-06-28 ENCOUNTER — Encounter: Payer: Self-pay | Admitting: Internal Medicine

## 2021-06-28 VITALS — BP 128/72 | HR 79 | Temp 98.2°F | Ht 65.0 in | Wt 178.4 lb

## 2021-06-28 DIAGNOSIS — Z23 Encounter for immunization: Secondary | ICD-10-CM

## 2021-06-28 DIAGNOSIS — I10 Essential (primary) hypertension: Secondary | ICD-10-CM | POA: Diagnosis not present

## 2021-06-28 DIAGNOSIS — I2584 Coronary atherosclerosis due to calcified coronary lesion: Secondary | ICD-10-CM | POA: Diagnosis not present

## 2021-06-28 DIAGNOSIS — N182 Chronic kidney disease, stage 2 (mild): Secondary | ICD-10-CM | POA: Diagnosis not present

## 2021-06-28 DIAGNOSIS — E039 Hypothyroidism, unspecified: Secondary | ICD-10-CM

## 2021-06-28 DIAGNOSIS — E1122 Type 2 diabetes mellitus with diabetic chronic kidney disease: Secondary | ICD-10-CM | POA: Diagnosis not present

## 2021-06-28 DIAGNOSIS — K760 Fatty (change of) liver, not elsewhere classified: Secondary | ICD-10-CM | POA: Diagnosis not present

## 2021-06-28 DIAGNOSIS — E1169 Type 2 diabetes mellitus with other specified complication: Secondary | ICD-10-CM

## 2021-06-28 DIAGNOSIS — I2510 Atherosclerotic heart disease of native coronary artery without angina pectoris: Secondary | ICD-10-CM

## 2021-06-28 DIAGNOSIS — Z716 Tobacco abuse counseling: Secondary | ICD-10-CM | POA: Diagnosis not present

## 2021-06-28 DIAGNOSIS — E785 Hyperlipidemia, unspecified: Secondary | ICD-10-CM | POA: Diagnosis not present

## 2021-06-28 LAB — LIPID PANEL
Cholesterol: 133 mg/dL (ref 0–200)
HDL: 55.7 mg/dL (ref >39.00)
LDL Cholesterol: 46 mg/dL (ref 0–99)
NonHDL: 77.43
Total CHOL/HDL Ratio: 2
Triglycerides: 156 mg/dL — ABNORMAL HIGH (ref 0.0–149.0)
VLDL: 31.2 mg/dL (ref 0.0–40.0)

## 2021-06-28 LAB — TSH: TSH: 1.36 u[IU]/mL (ref 0.35–5.50)

## 2021-06-28 NOTE — Assessment & Plan Note (Signed)
Well controlled on current regimen of losartan 100 mg daily . Renal function stable, no changes today. 

## 2021-06-28 NOTE — Assessment & Plan Note (Signed)
Congratulated her on cessation .  Sept 21 was her last cigarette

## 2021-06-28 NOTE — Patient Instructions (Signed)
You can start taking 800 ius of Vitamin E daily for your liver   If you will tolerate  metformin  500 mg daily ,  this will help fatty liver   Mediterranean diet is the best diet on the planet

## 2021-06-28 NOTE — Assessment & Plan Note (Addendum)
Foot exam done.  a1c done in late October was 6.2 .  Will resume metformin for fatty liver  Continue ARB,  ASA and statin

## 2021-06-28 NOTE — Progress Notes (Signed)
Subjective:  Patient ID: Crystal Meyers, female    DOB: 1945/12/20  Age: 75 y.o. MRN: [id number]  CC: The primary encounter diagnosis was Essential hypertension. Diagnoses of Type 2 diabetes mellitus with stage 2 chronic kidney disease, without long-term current use of insulin ([place]), Hyperlipidemia associated with type 2 diabetes mellitus ([place]), Acquired hypothyroidism, Need for immunization against influenza, Hepatic steatosis, and Tobacco abuse counseling were also pertinent to this visit.  HPI Crystal Meyers presents for  follow up on diabetes, hypertension CKD and fatty liver.  Chief Complaint  Patient presents with   Follow-up    diabetes    This visit occurred during the SARS-CoV-2 public health emergency.  Safety protocols were in place, including screening questions prior to the visit, additional usage of staff PPE, and extensive cleaning of exam room while observing appropriate contact time as indicated for disinfecting solutions.    T2DM:  She  feels generally well,  But is not  exercising regularly .  She has modified her diet but would like information on what to eat   Checking  blood sugars less than once daily at variable times, usually only if she feels she may be having a hypoglycemic event. .  BS have been under 100 fasting  Denies any recent hypoglyemic events.  Taking  [place] only (metformin was discontinued ). Following a carbohydrate modified diet 6 days per week. Denies numbness, burning and tingling of extremities. Appetite is good.    CKD:  seeing nephrology.  Not sure what to eat  Lab Results  Component Value Date   CREATININE 1.18 (H) 06/22/2021   Quit smoking on Sept 21  at my request   Outpatient Medications Prior to Visit  Medication Sig Dispense Refill   Accu-Chek Softclix Lancets lancets Use to check sugar once daily. Dx: E11.9 100 each 3   aspirin EC 81 MG tablet Take 1 tablet (81 mg total) by mouth daily. 90 tablet 1   atorvastatin (LIPITOR) 40 MG  tablet TAKE 1 TABLET BY MOUTH EVERY DAY 90 tablet 1   blood glucose meter kit and supplies Dispense based on patient and insurance preference. Use to check sugar once daily as directed. (FOR ICD-10 E10.9, E11.9). 1 each 0   dapagliflozin propanediol (FARXIGA) 10 MG TABS tablet Take 1 tablet (10 mg total) by mouth daily before breakfast. 90 tablet 3   levothyroxine (SYNTHROID) 88 MCG tablet TAKE 1 TABLET (88 MCG TOTAL) BY MOUTH DAILY BEFORE BREAKFAST. 90 tablet 3   losartan (COZAAR) 100 MG tablet TAKE 1 TABLET BY MOUTH EVERYDAY AT BEDTIME 90 tablet 3   Multiple Vitamin (MULTIVITAMIN) tablet Take 1 tablet by mouth daily.     nicotine polacrilex (NICORETTE) 4 MG gum Take 1 each (4 mg total) by mouth as needed for smoking cessation. 100 tablet 2   nitroGLYCERIN (NITROSTAT) 0.4 MG SL tablet Place under the tongue.     omeprazole (PRILOSEC) 40 MG capsule TAKE 1 CAPSULE BY MOUTH EVERY DAY 90 capsule 1   ONETOUCH VERIO test strip USE TO CHECK SUGAR ONCE DAILY E11.9 (VERIO PER PT) 100 strip 3   rOPINIRole (REQUIP) 0.25 MG tablet Take 0.25 mg by mouth at bedtime as needed.     traZODone (DESYREL) 100 MG tablet Take 1.5 tablets (150 mg total) by mouth at bedtime. 45 tablet 5   venlafaxine XR (EFFEXOR-XR) 150 MG 24 hr capsule Take 150 mg by mouth daily with breakfast.     No facility-administered medications prior to visit.  Review of Systems;  Patient denies headache, fevers, malaise, unintentional weight loss, skin rash, eye pain, sinus congestion and sinus pain, sore throat, dysphagia,  hemoptysis , cough, dyspnea, wheezing, chest pain, palpitations, orthopnea, edema, abdominal pain, nausea, melena, diarrhea, constipation, flank pain, dysuria, hematuria, urinary  Frequency, nocturia, numbness, tingling, seizures,  Focal weakness, Loss of consciousness,  Tremor, insomnia, depression, anxiety, and suicidal ideation.      Objective:  BP 128/72 (BP Location: Right Arm, Patient Position: Sitting, Cuff  Size: Normal)    Pulse 79    Temp 98.2 F (36.8 C) (Oral)    Ht _0  (1.651 m)    Wt 178 lb 6.4 oz (80.9 kg)    SpO2 98%    BMI 29.69 kg/m   BP Readings from Last 3 Encounters:  06/28/21 128/72  06/22/21 (!) 142/76  03/10/21 124/70    Wt Readings from Last 3 Encounters:  06/28/21 178 lb 6.4 oz (80.9 kg)  06/22/21 185 lb (83.9 kg)  04/01/21 183 lb (83 kg)    General appearance: alert, cooperative and appears stated age Ears: normal TM's and external ear canals both ears Throat: lips, mucosa, and tongue normal; teeth and gums normal Neck: no adenopathy, no carotid bruit, supple, symmetrical, trachea midline and thyroid not enlarged, symmetric, no tenderness/mass/nodules Back: symmetric, no curvature. ROM normal. No CVA tenderness. Lungs: clear to auscultation bilaterally Heart: regular rate and rhythm, S1, S2 normal, no murmur, click, rub or gallop Abdomen: soft, non-tender; bowel sounds normal; no masses,  no organomegaly Pulses: 2+ and symmetric Skin: Skin color, texture, turgor normal. No rashes or lesions Lymph nodes: Cervical, supraclavicular, and axillary nodes normal.  Lab Results  Component Value Date   HGBA1C 6.2 05/07/2021   HGBA1C 6.4 01/28/2021   HGBA1C 5.8 (A) 07/08/2020    Lab Results  Component Value Date   CREATININE 1.18 (H) 06/22/2021   CREATININE 1.20 05/07/2021   CREATININE 1.07 01/28/2021    Lab Results  Component Value Date   WBC 4.8 06/22/2021   HGB 13.1 06/22/2021   HCT 39.4 06/22/2021   PLT 246 06/22/2021   GLUCOSE 107 (H) 06/22/2021   CHOL 135 05/07/2021   TRIG 169.0 (H) 05/07/2021   HDL 48.70 05/07/2021   LDLDIRECT 66.0 01/28/2021   LDLCALC 52 05/07/2021   ALT 34 06/22/2021   AST 36 06/22/2021   NA 137 06/22/2021   K 4.2 06/22/2021   CL 98 06/22/2021   CREATININE 1.18 (H) 06/22/2021   BUN 16 06/22/2021   CO2 28 06/22/2021   TSH 1.65 01/28/2021   HGBA1C 6.2 05/07/2021   MICROALBUR <0.7 01/28/2021    US RENAL  Result Date:  06/15/2021 CLINICAL DATA:  Renal dysfunction EXAM: RENAL / URINARY TRACT ULTRASOUND COMPLETE COMPARISON:  None. FINDINGS: Right Kidney: Renal measurements: 10.2 x 4.4 x 4.2 cm = volume: 97.2 mL. There is no hydronephrosis. There is slightly increased cortical echogenicity. Left Kidney: Renal measurements: 10.6 x 4.5 x 4.8 cm = volume: 119 mL. There is no hydronephrosis. There is slightly increased cortical echogenicity. Bladder: Unremarkable. Other: There is increased echogenicity in the visualized portions of liver suggesting fatty infiltration. IMPRESSION: There is no hydronephrosis. Electronically Signed   By: Elmer Picker M.D.   On: 06/15/2021 15:13    Assessment & Plan:   Problem List Items Addressed This Visit     Type 2 diabetes mellitus with diabetic chronic kidney disease ([place])    Foot exam done.  a1c done in late October was 6.2 .  Will resume metformin for fatty liver  Continue ARB,  ASA and statin       Relevant Orders   Hemoglobin A1c   Tobacco abuse counseling    Congratulated her on cessation .  Sept 21 was her last cigarette      Hypothyroidism   Relevant Orders   TSH   Hyperlipidemia associated with type 2 diabetes mellitus ([place])   Relevant Orders   Lipid panel   Essential hypertension - Primary    Well controlled on current regimen of losartan  100 mg daily . Renal function stable, no changes today.      Other Visit Diagnoses     Need for immunization against influenza       Relevant Orders   Flu Vaccine QUAD High Dose(Fluad) (Completed)   Hepatic steatosis       Relevant Orders   Hepatitis B surface antibody,qualitative   Hepatitis A Ab, Total       I am having Crystal Meyers maintain her nitroGLYCERIN, rOPINIRole, aspirin EC, blood glucose meter kit and supplies, multivitamin, Accu-Chek Softclix Lancets, levothyroxine, OneTouch Verio, losartan, traZODone, venlafaxine XR, nicotine polacrilex, dapagliflozin propanediol, atorvastatin, and  omeprazole.  No orders of the defined types were placed in this encounter.    I provided  30 minutes of  face-to-face time during this encounter reviewing patient's current problems and past surgeries, labs and imaging studies, providing counseling on the above mentioned problems , and coordination  of care .   Follow-up: No follow-ups on file.   [name], MD

## 2021-06-29 LAB — HEPATITIS A ANTIBODY, TOTAL: Hepatitis A AB,Total: REACTIVE — AB

## 2021-06-29 LAB — HEPATITIS B SURFACE ANTIBODY,QUALITATIVE: Hep B S Ab: REACTIVE — AB

## 2021-07-10 DIAGNOSIS — N182 Chronic kidney disease, stage 2 (mild): Secondary | ICD-10-CM | POA: Diagnosis not present

## 2021-07-10 DIAGNOSIS — I2510 Atherosclerotic heart disease of native coronary artery without angina pectoris: Secondary | ICD-10-CM

## 2021-07-10 DIAGNOSIS — F3341 Major depressive disorder, recurrent, in partial remission: Secondary | ICD-10-CM | POA: Diagnosis not present

## 2021-07-10 DIAGNOSIS — E1122 Type 2 diabetes mellitus with diabetic chronic kidney disease: Secondary | ICD-10-CM

## 2021-07-10 DIAGNOSIS — I2584 Coronary atherosclerosis due to calcified coronary lesion: Secondary | ICD-10-CM

## 2021-07-15 ENCOUNTER — Telehealth: Payer: Self-pay | Admitting: Pharmacy Technician

## 2021-07-15 DIAGNOSIS — Z596 Low income: Secondary | ICD-10-CM

## 2021-07-15 NOTE — Progress Notes (Signed)
[facility])                                            [place] Team    07/15/2021  LANIE SCHELLING 1946/03/27 [id number]  Care coordination call placed to [employer] in regard to [facility] application.  Spoke to [employer] who informs patient is re enrolled and APPROVED 07/11/21-07/10/22. She informs patient's medication will be automatically filled and mailed out to her home based on last fill date in 2022. She informs this patient's next refill is due in February.  Carrieanne Kleen P. Rynell Ciotti, [place]  [phone]

## 2021-07-16 DIAGNOSIS — Z961 Presence of intraocular lens: Secondary | ICD-10-CM | POA: Diagnosis not present

## 2021-07-16 DIAGNOSIS — E119 Type 2 diabetes mellitus without complications: Secondary | ICD-10-CM | POA: Diagnosis not present

## 2021-07-16 DIAGNOSIS — H5203 Hypermetropia, bilateral: Secondary | ICD-10-CM | POA: Diagnosis not present

## 2021-07-16 DIAGNOSIS — H5213 Myopia, bilateral: Secondary | ICD-10-CM | POA: Diagnosis not present

## 2021-07-16 DIAGNOSIS — Z135 Encounter for screening for eye and ear disorders: Secondary | ICD-10-CM | POA: Diagnosis not present

## 2021-07-16 DIAGNOSIS — H52223 Regular astigmatism, bilateral: Secondary | ICD-10-CM | POA: Diagnosis not present

## 2021-07-16 DIAGNOSIS — H524 Presbyopia: Secondary | ICD-10-CM | POA: Diagnosis not present

## 2021-07-16 LAB — HM DIABETES EYE EXAM

## 2021-07-19 DIAGNOSIS — R8290 Unspecified abnormal findings in urine: Secondary | ICD-10-CM | POA: Diagnosis not present

## 2021-07-19 DIAGNOSIS — N1831 Chronic kidney disease, stage 3a: Secondary | ICD-10-CM | POA: Diagnosis not present

## 2021-07-19 DIAGNOSIS — E1122 Type 2 diabetes mellitus with diabetic chronic kidney disease: Secondary | ICD-10-CM | POA: Diagnosis not present

## 2021-07-19 DIAGNOSIS — J449 Chronic obstructive pulmonary disease, unspecified: Secondary | ICD-10-CM | POA: Diagnosis not present

## 2021-07-19 DIAGNOSIS — E785 Hyperlipidemia, unspecified: Secondary | ICD-10-CM | POA: Diagnosis not present

## 2021-07-19 DIAGNOSIS — I10 Essential (primary) hypertension: Secondary | ICD-10-CM | POA: Diagnosis not present

## 2021-07-28 DIAGNOSIS — E1122 Type 2 diabetes mellitus with diabetic chronic kidney disease: Secondary | ICD-10-CM | POA: Diagnosis not present

## 2021-07-28 DIAGNOSIS — F32A Depression, unspecified: Secondary | ICD-10-CM | POA: Diagnosis not present

## 2021-07-28 DIAGNOSIS — G2581 Restless legs syndrome: Secondary | ICD-10-CM | POA: Diagnosis not present

## 2021-07-28 DIAGNOSIS — I10 Essential (primary) hypertension: Secondary | ICD-10-CM | POA: Diagnosis not present

## 2021-07-28 DIAGNOSIS — G4733 Obstructive sleep apnea (adult) (pediatric): Secondary | ICD-10-CM | POA: Diagnosis not present

## 2021-07-28 DIAGNOSIS — N1831 Chronic kidney disease, stage 3a: Secondary | ICD-10-CM | POA: Diagnosis not present

## 2021-09-01 ENCOUNTER — Other Ambulatory Visit: Payer: Self-pay | Admitting: Internal Medicine

## 2021-09-09 ENCOUNTER — Telehealth: Payer: Medicare Other

## 2021-09-10 ENCOUNTER — Telehealth: Payer: Self-pay

## 2021-09-10 NOTE — Chronic Care Management (AMB) (Signed)
?  Care Management  ? ?Note ? ?09/10/2021 ?Name: BETSAIDA MISSOURI MRN: [id number] DOB: [birth year] ? ?HENRY DEMERITT is a 76 y.o. year old female who is a primary care patient of Derrel Nip, Aris Everts, MD and is actively engaged with the care management team. I reached out to [name] by phone today to assist with re-scheduling a follow up visit with the Pharmacist ? ?Follow up plan: ?Unsuccessful telephone outreach attempt made. A HIPAA compliant phone message was left for the patient providing contact information and requesting a return call.  ?The care management team will reach out to the patient again over the next 7 days.  ?If patient returns call to provider office, please advise to call Rafael Capo  at [phone] ? ?Noreene Larsson, RMA ?Care Guide, Embedded Care Coordination ?[place]  [facility]  ?[place], [place] [address] ?Direct Dial: [phone] ?[email or web address] ?[email or web address]: [email or web address]  ? ?

## 2021-09-16 ENCOUNTER — Ambulatory Visit: Payer: Self-pay | Admitting: Pharmacist

## 2021-09-16 NOTE — Chronic Care Management (AMB) (Signed)
?  Chronic Care Management  ? ?Note ? ?09/16/2021 ?Name: Crystal Meyers MRN: [id number] DOB: 04/30/1946 ? ? ? ?Closing pharmacy CCM case at this time.  Patient has clinic contact information for future questions or concerns.  ? ?[name], PharmD, [place], CPP ?Clinical Pharmacist ?Therapist, music at [employer] ?[phone] ? ?

## 2021-09-16 NOTE — Patient Instructions (Signed)
Hi [place],  ? ?I am being asked to quickly transition into another role within the health system. Please continue to follow up with your primary care provider as scheduled.  ? ?I wanted to make sure you had the below information:  ? ?The AZ&Me prescription savings program for [facility] [place] has an auto-refill program where you do not need to call and request a refill each time. You should receive your medication shipment before you run out of your medication. However, if you have any issues or need assistance, you can call them call [phone]. They are available Monday though Friday 9:00 AM - 6:00 PM.  ? ?It has been a pleasure working with you! ? ?[name], PharmD ? ?

## 2021-09-17 ENCOUNTER — Other Ambulatory Visit: Payer: Self-pay

## 2021-09-17 ENCOUNTER — Telehealth: Payer: Medicare Other

## 2021-09-17 DIAGNOSIS — Z87891 Personal history of nicotine dependence: Secondary | ICD-10-CM

## 2021-09-24 NOTE — Progress Notes (Signed)
Outreach to patient to inform her of Catie's Transition. ?Unable to leave message no Voicemail ? ?Noreene Larsson, RMA ?Care Guide, Embedded Care Coordination ?[facility]  ?[place], [place] [address] ?Direct Dial: [phone] ?Museum/gallery [email or web address] ?Website: [email or web address]  ? ?

## 2021-09-28 DIAGNOSIS — Z20822 Contact with and (suspected) exposure to covid-19: Secondary | ICD-10-CM | POA: Diagnosis not present

## 2021-09-30 ENCOUNTER — Other Ambulatory Visit: Payer: Self-pay

## 2021-09-30 ENCOUNTER — Encounter: Payer: Self-pay | Admitting: Internal Medicine

## 2021-09-30 ENCOUNTER — Ambulatory Visit (INDEPENDENT_AMBULATORY_CARE_PROVIDER_SITE_OTHER): Payer: Medicare Other | Admitting: Internal Medicine

## 2021-09-30 VITALS — BP 120/70 | HR 74 | Temp 98.1°F | Ht 65.0 in | Wt 161.8 lb

## 2021-09-30 DIAGNOSIS — K760 Fatty (change of) liver, not elsewhere classified: Secondary | ICD-10-CM | POA: Diagnosis not present

## 2021-09-30 DIAGNOSIS — N182 Chronic kidney disease, stage 2 (mild): Secondary | ICD-10-CM

## 2021-09-30 DIAGNOSIS — E785 Hyperlipidemia, unspecified: Secondary | ICD-10-CM | POA: Diagnosis not present

## 2021-09-30 DIAGNOSIS — E1169 Type 2 diabetes mellitus with other specified complication: Secondary | ICD-10-CM

## 2021-09-30 DIAGNOSIS — E1122 Type 2 diabetes mellitus with diabetic chronic kidney disease: Secondary | ICD-10-CM

## 2021-09-30 DIAGNOSIS — E039 Hypothyroidism, unspecified: Secondary | ICD-10-CM | POA: Diagnosis not present

## 2021-09-30 DIAGNOSIS — F339 Major depressive disorder, recurrent, unspecified: Secondary | ICD-10-CM | POA: Diagnosis not present

## 2021-09-30 DIAGNOSIS — G500 Trigeminal neuralgia: Secondary | ICD-10-CM

## 2021-09-30 DIAGNOSIS — I10 Essential (primary) hypertension: Secondary | ICD-10-CM | POA: Diagnosis not present

## 2021-09-30 LAB — COMPREHENSIVE METABOLIC PANEL
ALT: 21 U/L (ref 0–35)
AST: 23 U/L (ref 0–37)
Albumin: 4.5 g/dL (ref 3.5–5.2)
Alkaline Phosphatase: 38 U/L — ABNORMAL LOW (ref 39–117)
BUN: 12 mg/dL (ref 6–23)
CO2: 33 mEq/L — ABNORMAL HIGH (ref 19–32)
Calcium: 9.9 mg/dL (ref 8.4–10.5)
Chloride: 100 mEq/L (ref 96–112)
Creatinine, Ser: 1.08 mg/dL (ref 0.40–1.20)
GFR: 51.03 mL/min — ABNORMAL LOW (ref >60.00)
Glucose, Bld: 95 mg/dL (ref 70–99)
Potassium: 3.9 mEq/L (ref 3.5–5.1)
Sodium: 139 mEq/L (ref 135–145)
Total Bilirubin: 0.4 mg/dL (ref 0.2–1.2)
Total Protein: 7.2 g/dL (ref 6.0–8.3)

## 2021-09-30 LAB — MICROALBUMIN / CREATININE URINE RATIO
Creatinine,U: 114.7 mg/dL
Microalb Creat Ratio: 0.6 mg/g (ref 0.0–30.0)
Microalb, Ur: <0.7 mg/dL (ref 0.0–1.9)

## 2021-09-30 LAB — LIPID PANEL
Cholesterol: 115 mg/dL (ref 0–200)
HDL: 47.5 mg/dL (ref >39.00)
LDL Cholesterol: 45 mg/dL (ref 0–99)
NonHDL: 67.48
Total CHOL/HDL Ratio: 2
Triglycerides: 112 mg/dL (ref 0.0–149.0)
VLDL: 22.4 mg/dL (ref 0.0–40.0)

## 2021-09-30 LAB — HEMOGLOBIN A1C: Hgb A1c MFr Bld: 6 % (ref 4.6–6.5)

## 2021-09-30 LAB — LDL CHOLESTEROL, DIRECT: Direct LDL: 49 mg/dL

## 2021-09-30 MED ORDER — ZOSTER VAC RECOMB ADJUVANTED 50 MCG/0.5ML IM SUSR: 0.5000 mL | Freq: Once | INTRAMUSCULAR | 1 refills | Status: AC

## 2021-09-30 NOTE — Patient Instructions (Signed)
You have lost 20 lbs since the last time I saw you! ? ?Allow yourself a treat once in a while  ? ?I'll see you in 6 months  ?

## 2021-09-30 NOTE — Assessment & Plan Note (Signed)
Noted by Dr Vira Agar years ago  Noted on recent renal US  ? ?Lab Results  ?Component Value Date  ? ALT 34 06/22/2021  ? AST 36 06/22/2021  ? ALKPHOS 43 06/22/2021  ? BILITOT 0.6 06/22/2021  ? ? ?

## 2021-09-30 NOTE — Progress Notes (Signed)
? ?Subjective:  ?Patient ID: Crystal Meyers, female    DOB: 1946/05/01  Age: 76 y.o. MRN: [id number] ? ?CC: The primary encounter diagnosis was Essential hypertension. Diagnoses of Type 2 diabetes mellitus with stage 2 chronic kidney disease, without long-term current use of insulin ([place]), Hyperlipidemia associated with type 2 diabetes mellitus ([place]), Trigeminal neuralgia of right side of face, Hepatic steatosis, Major depressive disorder, recurrent episode with melancholic features ([place]), and Acquired hypothyroidism were also pertinent to this visit. ? ? ?This visit occurred during the SARS-CoV-2 public health emergency.  Safety protocols were in place, including screening questions prior to the visit, additional usage of staff PPE, and extensive cleaning of exam room while observing appropriate contact time as indicated for disinfecting solutions.   ? ?HPI ?Crystal Meyers presents for  ?Chief Complaint  ?Patient presents with  ? Follow-up  ?  3 month follow up on diabetes  ? ?1) T2DM:   She  feels generally well,   exercising regularly and has lost 20 lbs since  changing her diet  in December .   Checking  blood sugars less than once daily at variable times, usually only if she feels she may be having a hypoglycemic event. .  BS have been under 100 fasting   Denies any recent hypoglyemic events unless she skips a meal. .  Taking   medications as directed. Following a carbohydrate modified diet 6 days per week. Denies numbness, burning and tingling of extremities. Appetite is good.  Walking nearly daily .  No chest pain,  joit pain.   foot exam done today eye exam doe recently awaiting  copy  ? ?2) Hypertension: patient checks blood pressure twice weekly at home.  Readings have been for the most part < 130/80 at rest . Patient is following a reduce salt diet most days and is taking medications as prescribed  ? ?3) toenail fungus left great toe using listerine ,  thinks it Is improving  ? ?4) facial lesion on right  cheek near nose.  Sees dermatology annually,  had an inflamed SK removed by [facility] last year that has returned with telangiectasia s.  Face feels dry after washing and moisturizing . Using Gold Bond moisturizer. ? ?  ? ? ?Outpatient Medications Prior to Visit  ?Medication Sig Dispense Refill  ? Accu-Chek Softclix Lancets lancets Use to check sugar once daily. Dx: E11.9 100 each 3  ? aspirin EC 81 MG tablet Take 1 tablet (81 mg total) by mouth daily. 90 tablet 1  ? atorvastatin (LIPITOR) 40 MG tablet TAKE 1 TABLET BY MOUTH EVERY DAY 90 tablet 1  ? B Complex-C (B-COMPLEX WITH VITAMIN C) tablet Take 1 tablet by mouth daily.    ? blood glucose meter kit and supplies Dispense based on patient and insurance preference. Use to check sugar once daily as directed. (FOR ICD-10 E10.9, E11.9). 1 each 0  ? dapagliflozin propanediol (FARXIGA) 10 MG TABS tablet Take 1 tablet (10 mg total) by mouth daily before breakfast. 90 tablet 3  ? losartan (COZAAR) 100 MG tablet TAKE 1 TABLET BY MOUTH EVERYDAY AT BEDTIME 90 tablet 3  ? nicotine polacrilex (NICORETTE) 4 MG gum Take 1 each (4 mg total) by mouth as needed for smoking cessation. 100 tablet 2  ? nitroGLYCERIN (NITROSTAT) 0.4 MG SL tablet Place under the tongue.    ? omeprazole (PRILOSEC) 40 MG capsule TAKE 1 CAPSULE BY MOUTH EVERY DAY 90 capsule 1  ? ONETOUCH VERIO test strip USE  TO CHECK SUGAR ONCE DAILY E11.9 (VERIO PER PT) 100 strip 3  ? rOPINIRole (REQUIP) 0.25 MG tablet Take 0.25 mg by mouth at bedtime as needed.    ? traZODone (DESYREL) 100 MG tablet Take 1.5 tablets (150 mg total) by mouth at bedtime. 45 tablet 5  ? Turmeric 500 MG CAPS Take 2 capsules by mouth daily.    ? venlafaxine XR (EFFEXOR-XR) 150 MG 24 hr capsule TAKE 1 CAPSULE BY MOUTH EVERY DAY 90 capsule 1  ? vitamin B-12 (CYANOCOBALAMIN) 1000 MCG tablet Take 1,000 mcg by mouth daily.    ? Vitamin D, Cholecalciferol, 25 MCG (1000 UT) TABS Take 1 tablet by mouth daily.    ? levothyroxine (SYNTHROID) 88 MCG tablet  TAKE 1 TABLET (88 MCG TOTAL) BY MOUTH DAILY BEFORE BREAKFAST. 90 tablet 3  ? Multiple Vitamin (MULTIVITAMIN) tablet Take 1 tablet by mouth daily.    ? ?No facility-administered medications prior to visit.  ? ? ?Review of Systems; ? ?Patient denies headache, fevers, malaise, unintentional weight loss, skin rash, eye pain, sinus congestion and sinus pain, sore throat, dysphagia,  hemoptysis , cough, dyspnea, wheezing, chest pain, palpitations, orthopnea, edema, abdominal pain, nausea, melena, diarrhea, constipation, flank pain, dysuria, hematuria, urinary  Frequency, nocturia, numbness, tingling, seizures,  Focal weakness, Loss of consciousness,  Tremor, insomnia, depression, anxiety, and suicidal ideation.   ? ? ? ?Objective:  ?BP 120/70 (BP Location: Left Arm, Patient Position: Sitting, Cuff Size: Normal)   Pulse 74   Temp 98.1 ?F (36.7 ?C) (Oral)   Ht _0  (1.651 m)   Wt 161 lb 12.8 oz (73.4 kg)   SpO2 97%   BMI 26.92 kg/m?  ? ?BP Readings from Last 3 Encounters:  ?09/30/21 120/70  ?06/28/21 128/72  ?06/22/21 (!) 142/76  ? ? ?Wt Readings from Last 3 Encounters:  ?09/30/21 161 lb 12.8 oz (73.4 kg)  ?06/28/21 178 lb 6.4 oz (80.9 kg)  ?06/22/21 185 lb (83.9 kg)  ? ? ?General appearance: alert, cooperative and appears stated age ?Ears: normal TM's and external ear canals both ears ?Throat: lips, mucosa, and tongue normal; teeth and gums normal ?Neck: no adenopathy, no carotid bruit, supple, symmetrical, trachea midline and thyroid not enlarged, symmetric, no tenderness/mass/nodules ?Back: symmetric, no curvature. ROM normal. No CVA tenderness. ?Lungs: clear to auscultation bilaterally ?Heart: regular rate and rhythm, S1, S2 normal, no murmur, click, rub or gallop ?Abdomen: soft, non-tender; bowel sounds normal; no masses,  no organomegaly ?Pulses: 2+ and symmetric ?Skin: Skin color, texture, turgor normal. No rashes or lesions ?Lymph nodes: Cervical, supraclavicular, and axillary nodes normal. ? ?Lab Results   ?Component Value Date  ? HGBA1C 6.0 09/30/2021  ? HGBA1C 6.2 05/07/2021  ? HGBA1C 6.4 01/28/2021  ? ? ?Lab Results  ?Component Value Date  ? CREATININE 1.08 09/30/2021  ? CREATININE 1.18 (H) 06/22/2021  ? CREATININE 1.20 05/07/2021  ? ? ?Lab Results  ?Component Value Date  ? WBC 4.8 06/22/2021  ? HGB 13.1 06/22/2021  ? HCT 39.4 06/22/2021  ? PLT 246 06/22/2021  ? GLUCOSE 95 09/30/2021  ? CHOL 115 09/30/2021  ? TRIG 112.0 09/30/2021  ? HDL 47.50 09/30/2021  ? LDLDIRECT 49.0 09/30/2021  ? LDLCALC 45 09/30/2021  ? ALT 21 09/30/2021  ? AST 23 09/30/2021  ? NA 139 09/30/2021  ? K 3.9 09/30/2021  ? CL 100 09/30/2021  ? CREATININE 1.08 09/30/2021  ? BUN 12 09/30/2021  ? CO2 33 (H) 09/30/2021  ? TSH 1.36 06/28/2021  ? HGBA1C  6.0 09/30/2021  ? MICROALBUR <0.7 09/30/2021  ? ? ?US RENAL ? ?Result Date: 06/15/2021 ?CLINICAL DATA:  Renal dysfunction EXAM: RENAL / URINARY TRACT ULTRASOUND COMPLETE COMPARISON:  None. FINDINGS: Right Kidney: Renal measurements: 10.2 x 4.4 x 4.2 cm = volume: 97.2 mL. There is no hydronephrosis. There is slightly increased cortical echogenicity. Left Kidney: Renal measurements: 10.6 x 4.5 x 4.8 cm = volume: 119 mL. There is no hydronephrosis. There is slightly increased cortical echogenicity. Bladder: Unremarkable. Other: There is increased echogenicity in the visualized portions of liver suggesting fatty infiltration. IMPRESSION: There is no hydronephrosis. Electronically Signed   By: Elmer Picker M.D.   On: 06/15/2021 15:13  ? ? ?Assessment & Plan:  ? ?Problem List Items Addressed This Visit   ? ? Type 2 diabetes mellitus with diabetic chronic kidney disease ([place])  ?  Currently well-controlled on current medications including Farxiga  .  hemoglobin A1c is at goal of less than 7.0 . Patient is reminded to schedule an annual eye exam and foot exam is normal today. Patient has no microalbuminuria and BP is well controlled on losartan  Patient is tolerating statin therapy for CAD risk reduction  and on ACE/ARB for renal protection and hypertension  ? ?Lab Results  ?Component Value Date  ? HGBA1C 6.0 09/30/2021  ? ?Lab Results  ?Component Value Date  ? CREATININE 1.08 09/30/2021  ? ?Lab Results  ?[place]

## 2021-10-01 MED ORDER — LEVOTHYROXINE SODIUM 88 MCG PO TABS: 88.0000 ug | ORAL_TABLET | Freq: Every day | ORAL | 3 refills | Status: DC

## 2021-10-01 NOTE — Assessment & Plan Note (Addendum)
Currently well-controlled on current medications including Farxiga  .  hemoglobin A1c is at goal of less than 7.0 . Patient is reminded to schedule an annual eye exam and foot exam is normal today. Patient has no microalbuminuria and BP is well controlled on losartan  Patient is tolerating statin therapy for CAD risk reduction and on ACE/ARB for renal protection and hypertension  ? ?Lab Results  ?Component Value Date  ? HGBA1C 6.0 09/30/2021  ? ?Lab Results  ?Component Value Date  ? CREATININE 1.08 09/30/2021  ? ?Lab Results  ?Component Value Date  ? CHOL 115 09/30/2021  ? HDL 47.50 09/30/2021  ? LDLCALC 45 09/30/2021  ? LDLDIRECT 49.0 09/30/2021  ? TRIG 112.0 09/30/2021  ? CHOLHDL 2 09/30/2021  ? ? ?

## 2021-10-01 NOTE — Assessment & Plan Note (Signed)
Thyroid function is WNL on current dose.  No current changes needed.  ? ?Lab Results  ?Component Value Date  ? TSH 1.36 06/28/2021  ? ? ?

## 2021-10-01 NOTE — Assessment & Plan Note (Signed)
Symptoms are well controlled on  effexor at the reduced dose of 75 mg daily .    Her insomnia has improved with higher dose of trazodone.  ?

## 2021-10-05 ENCOUNTER — Ambulatory Visit
Admission: RE | Admit: 2021-10-05 | Discharge: 2021-10-05 | Disposition: A | Discharge status: Home / Self Care | Payer: Medicare Other | Source: Ambulatory Visit | Attending: Acute Care | Admitting: Acute Care

## 2021-10-05 ENCOUNTER — Other Ambulatory Visit: Payer: Self-pay

## 2021-10-05 DIAGNOSIS — Z87891 Personal history of nicotine dependence: Secondary | ICD-10-CM | POA: Insufficient documentation

## 2021-10-05 DIAGNOSIS — F17210 Nicotine dependence, cigarettes, uncomplicated: Secondary | ICD-10-CM | POA: Diagnosis not present

## 2021-10-06 NOTE — Chronic Care Management (AMB) (Signed)
?  Chronic Care Management  ? ?Note ? ?10/06/2021 ?Name: [name] MRN: [id number] DOB: [birth year] ? ?Crystal Meyers is a 76 y.o. year old female who is a primary care patient of Derrel Nip, Aris Everts, MD. TYSHEKA FANGUY is currently enrolled in care management services. An additional referral for RN CM  was placed.  ? ?Follow up plan: ?Patient declines further follow up and engagement by the care management team. Appropriate care team members and provider have been notified via electronic communication.  ? ?Noreene Larsson, RMA ?Care Guide, Embedded Care Coordination ?[facility]  ?[place], [place] [address] ?Direct Dial: [phone] ?Museum/gallery [email or web address] ?Website: [email or web address]  ? ?

## 2021-10-07 ENCOUNTER — Other Ambulatory Visit: Payer: Self-pay

## 2021-10-07 DIAGNOSIS — Z87891 Personal history of nicotine dependence: Secondary | ICD-10-CM

## 2021-10-28 DIAGNOSIS — E1122 Type 2 diabetes mellitus with diabetic chronic kidney disease: Secondary | ICD-10-CM | POA: Diagnosis not present

## 2021-10-28 DIAGNOSIS — F32A Depression, unspecified: Secondary | ICD-10-CM | POA: Diagnosis not present

## 2021-10-28 DIAGNOSIS — N1831 Chronic kidney disease, stage 3a: Secondary | ICD-10-CM | POA: Diagnosis not present

## 2021-10-28 DIAGNOSIS — I10 Essential (primary) hypertension: Secondary | ICD-10-CM | POA: Diagnosis not present

## 2021-10-28 DIAGNOSIS — G4733 Obstructive sleep apnea (adult) (pediatric): Secondary | ICD-10-CM | POA: Diagnosis not present

## 2021-11-03 DIAGNOSIS — F32A Depression, unspecified: Secondary | ICD-10-CM | POA: Diagnosis not present

## 2021-11-03 DIAGNOSIS — J449 Chronic obstructive pulmonary disease, unspecified: Secondary | ICD-10-CM | POA: Diagnosis not present

## 2021-11-03 DIAGNOSIS — E1122 Type 2 diabetes mellitus with diabetic chronic kidney disease: Secondary | ICD-10-CM | POA: Diagnosis not present

## 2021-11-03 DIAGNOSIS — I10 Essential (primary) hypertension: Secondary | ICD-10-CM | POA: Diagnosis not present

## 2021-11-03 DIAGNOSIS — G4733 Obstructive sleep apnea (adult) (pediatric): Secondary | ICD-10-CM | POA: Diagnosis not present

## 2021-11-03 DIAGNOSIS — N1831 Chronic kidney disease, stage 3a: Secondary | ICD-10-CM | POA: Diagnosis not present

## 2021-11-03 DIAGNOSIS — E785 Hyperlipidemia, unspecified: Secondary | ICD-10-CM | POA: Diagnosis not present

## 2021-11-03 DIAGNOSIS — R8290 Unspecified abnormal findings in urine: Secondary | ICD-10-CM | POA: Diagnosis not present

## 2021-11-03 DIAGNOSIS — G2581 Restless legs syndrome: Secondary | ICD-10-CM | POA: Diagnosis not present

## 2021-11-11 DIAGNOSIS — U071 COVID-19: Secondary | ICD-10-CM | POA: Diagnosis not present

## 2021-11-11 DIAGNOSIS — Z20822 Contact with and (suspected) exposure to covid-19: Secondary | ICD-10-CM | POA: Diagnosis not present

## 2021-11-15 DIAGNOSIS — Z20822 Contact with and (suspected) exposure to covid-19: Secondary | ICD-10-CM | POA: Diagnosis not present

## 2021-11-19 LAB — HM DIABETES EYE EXAM

## 2021-12-23 ENCOUNTER — Other Ambulatory Visit: Payer: Self-pay | Admitting: Internal Medicine

## 2022-01-20 ENCOUNTER — Other Ambulatory Visit: Payer: Self-pay | Admitting: Internal Medicine

## 2022-01-26 ENCOUNTER — Other Ambulatory Visit: Payer: Self-pay | Admitting: Internal Medicine

## 2022-02-26 ENCOUNTER — Other Ambulatory Visit: Payer: Self-pay | Admitting: Internal Medicine

## 2022-03-08 DIAGNOSIS — D2262 Melanocytic nevi of left upper limb, including shoulder: Secondary | ICD-10-CM | POA: Diagnosis not present

## 2022-03-08 DIAGNOSIS — D2272 Melanocytic nevi of left lower limb, including hip: Secondary | ICD-10-CM | POA: Diagnosis not present

## 2022-03-08 DIAGNOSIS — D2261 Melanocytic nevi of right upper limb, including shoulder: Secondary | ICD-10-CM | POA: Diagnosis not present

## 2022-03-08 DIAGNOSIS — I781 Nevus, non-neoplastic: Secondary | ICD-10-CM | POA: Diagnosis not present

## 2022-03-08 DIAGNOSIS — L821 Other seborrheic keratosis: Secondary | ICD-10-CM | POA: Diagnosis not present

## 2022-03-08 DIAGNOSIS — D2271 Melanocytic nevi of right lower limb, including hip: Secondary | ICD-10-CM | POA: Diagnosis not present

## 2022-03-08 DIAGNOSIS — Z08 Encounter for follow-up examination after completed treatment for malignant neoplasm: Secondary | ICD-10-CM | POA: Diagnosis not present

## 2022-03-08 DIAGNOSIS — Z85828 Personal history of other malignant neoplasm of skin: Secondary | ICD-10-CM | POA: Diagnosis not present

## 2022-03-08 DIAGNOSIS — D225 Melanocytic nevi of trunk: Secondary | ICD-10-CM | POA: Diagnosis not present

## 2022-03-08 DIAGNOSIS — D692 Other nonthrombocytopenic purpura: Secondary | ICD-10-CM | POA: Diagnosis not present

## 2022-04-04 ENCOUNTER — Ambulatory Visit (INDEPENDENT_AMBULATORY_CARE_PROVIDER_SITE_OTHER): Payer: Medicare Other | Admitting: Internal Medicine

## 2022-04-04 ENCOUNTER — Encounter: Payer: Self-pay | Admitting: Internal Medicine

## 2022-04-04 VITALS — BP 110/66 | HR 66 | Temp 97.9°F | Ht 66.0 in | Wt 147.0 lb

## 2022-04-04 DIAGNOSIS — Z23 Encounter for immunization: Secondary | ICD-10-CM

## 2022-04-04 DIAGNOSIS — G4452 New daily persistent headache (NDPH): Secondary | ICD-10-CM

## 2022-04-04 DIAGNOSIS — E663 Overweight: Secondary | ICD-10-CM | POA: Diagnosis not present

## 2022-04-04 DIAGNOSIS — N182 Chronic kidney disease, stage 2 (mild): Secondary | ICD-10-CM | POA: Diagnosis not present

## 2022-04-04 DIAGNOSIS — J431 Panlobular emphysema: Secondary | ICD-10-CM

## 2022-04-04 DIAGNOSIS — I10 Essential (primary) hypertension: Secondary | ICD-10-CM

## 2022-04-04 DIAGNOSIS — E039 Hypothyroidism, unspecified: Secondary | ICD-10-CM

## 2022-04-04 DIAGNOSIS — I700 Atherosclerosis of aorta: Secondary | ICD-10-CM

## 2022-04-04 DIAGNOSIS — R3989 Other symptoms and signs involving the genitourinary system: Secondary | ICD-10-CM

## 2022-04-04 DIAGNOSIS — E1169 Type 2 diabetes mellitus with other specified complication: Secondary | ICD-10-CM | POA: Diagnosis not present

## 2022-04-04 DIAGNOSIS — G4733 Obstructive sleep apnea (adult) (pediatric): Secondary | ICD-10-CM

## 2022-04-04 DIAGNOSIS — E785 Hyperlipidemia, unspecified: Secondary | ICD-10-CM | POA: Diagnosis not present

## 2022-04-04 DIAGNOSIS — Z9989 Dependence on other enabling machines and devices: Secondary | ICD-10-CM

## 2022-04-04 DIAGNOSIS — E1122 Type 2 diabetes mellitus with diabetic chronic kidney disease: Secondary | ICD-10-CM | POA: Diagnosis not present

## 2022-04-04 LAB — LDL CHOLESTEROL, DIRECT: Direct LDL: 58 mg/dL

## 2022-04-04 LAB — COMPREHENSIVE METABOLIC PANEL
ALT: 23 U/L (ref 0–35)
AST: 23 U/L (ref 0–37)
Albumin: 4.3 g/dL (ref 3.5–5.2)
Alkaline Phosphatase: 45 U/L (ref 39–117)
BUN: 22 mg/dL (ref 6–23)
CO2: 31 mEq/L (ref 19–32)
Calcium: 9.8 mg/dL (ref 8.4–10.5)
Chloride: 102 mEq/L (ref 96–112)
Creatinine, Ser: 1.09 mg/dL (ref 0.40–1.20)
GFR: 50.29 mL/min — ABNORMAL LOW (ref >60.00)
Glucose, Bld: 88 mg/dL (ref 70–99)
Potassium: 4.4 mEq/L (ref 3.5–5.1)
Sodium: 143 mEq/L (ref 135–145)
Total Bilirubin: 0.4 mg/dL (ref 0.2–1.2)
Total Protein: 7.1 g/dL (ref 6.0–8.3)

## 2022-04-04 LAB — URINALYSIS, ROUTINE W REFLEX MICROSCOPIC
Bilirubin Urine: NEGATIVE
Hgb urine dipstick: NEGATIVE
Ketones, ur: NEGATIVE
Leukocytes,Ua: NEGATIVE
Nitrite: NEGATIVE
RBC / HPF: NONE SEEN (ref 0–?)
Specific Gravity, Urine: 1.02 (ref 1.000–1.030)
Total Protein, Urine: NEGATIVE
Urine Glucose: >=1000 — AB
Urobilinogen, UA: 0.2 (ref 0.0–1.0)
pH: 6 (ref 5.0–8.0)

## 2022-04-04 LAB — TSH: TSH: 2.89 u[IU]/mL (ref 0.35–5.50)

## 2022-04-04 LAB — LIPID PANEL
Cholesterol: 143 mg/dL (ref 0–200)
HDL: 56.2 mg/dL (ref >39.00)
LDL Cholesterol: 59 mg/dL (ref 0–99)
NonHDL: 86.9
Total CHOL/HDL Ratio: 3
Triglycerides: 142 mg/dL (ref 0.0–149.0)
VLDL: 28.4 mg/dL (ref 0.0–40.0)

## 2022-04-04 MED ORDER — RSVPREF3 VAC RECOMB ADJUVANTED 120 MCG/0.5ML IM SUSR: 0.5000 mL | Freq: Once | INTRAMUSCULAR | 0 refills | Status: AC

## 2022-04-04 NOTE — Progress Notes (Signed)
Subjective:  Patient ID: Crystal Meyers, female    DOB: 12/18/1945  Age: 76 y.o. MRN: [id number]  CC: The primary encounter diagnosis was Essential hypertension. Diagnoses of Acquired hypothyroidism, Hyperlipidemia associated with type 2 diabetes mellitus ([place]), Type 2 diabetes mellitus with stage 2 chronic kidney disease, without long-term current use of insulin ([place]), Overweight (BMI 25.0-29.9), Panlobular emphysema ([place]), Aortic atherosclerosis ([place]), Need for immunization against influenza, Urine discoloration, OSA on CPAP, and Headache, new daily persistent ([facility]) were also pertinent to this visit.   HPI Crystal Meyers presents for follow up on type 2 DM, with fatty liver, aortic atherosclerosis   Chief Complaint  Patient presents with   Follow-up    6 month follow up     1) Weight loss: her weight dropped to 138 lbs from 155 lbs in May after changing her diet to the nephrologist's recommendations.  She reports  appetite but lost weight due to restrictions  imposed by the CKD  diet.   Has liberalized her diet : included 1-2 frappes   from [employer],  potatoes salad and beans ) and has gained 8 lbs.  Her weight loss was accompanied by leg weakness , loss of balance , and bilateral hip pain,     2) Type 2 DM:  She  feels generally well,  But is not  exercising regularly or trying to lose weight. Checking  blood sugars less than once daily at variable times, usually only if she feels she may be having a hypoglycemic event. .  BS have been under 100 fasting and < 150 post prandially.  Denies any recent hypoglyemic events.  Taking   medications as directed. Following a carbohydrate modified diet 6 days per week. Denies numbness, burning and tingling of extremities. Appetite is good.     3) Aortic atherosclerosis : Reviewed findings of prior CT scan  Patient is tolerating statin therapy.     Outpatient Medications Prior to Visit  Medication Sig Dispense Refill   aspirin EC 81 MG tablet Take  1 tablet (81 mg total) by mouth daily. 90 tablet 1   atorvastatin (LIPITOR) 40 MG tablet TAKE 1 TABLET BY MOUTH EVERY DAY 90 tablet 1   B Complex-C (B-COMPLEX WITH VITAMIN C) tablet Take 1 tablet by mouth daily.     blood glucose meter kit and supplies Dispense based on patient and insurance preference. Use to check sugar once daily as directed. (FOR ICD-10 E10.9, E11.9). 1 each 0   dapagliflozin propanediol (FARXIGA) 10 MG TABS tablet Take 1 tablet (10 mg total) by mouth daily before breakfast. 90 tablet 3   Lancets (ONETOUCH DELICA PLUS [id number]) MISC USE TO CHECK SUGAR ONCE DAILY I50.2 (DELICA PER PT) 774 each 3   levothyroxine (SYNTHROID) 88 MCG tablet Take 1 tablet (88 mcg total) by mouth daily before breakfast. 90 tablet 3   losartan (COZAAR) 100 MG tablet TAKE 1 TABLET BY MOUTH EVERYDAY AT BEDTIME 90 tablet 3   nicotine polacrilex (NICORETTE) 4 MG gum Take 1 each (4 mg total) by mouth as needed for smoking cessation. 100 tablet 2   nitroGLYCERIN (NITROSTAT) 0.4 MG SL tablet Place under the tongue.     omeprazole (PRILOSEC) 40 MG capsule TAKE 1 CAPSULE BY MOUTH EVERY DAY 90 capsule 1   ONETOUCH VERIO test strip USE TO CHECK SUGAR ONCE DAILY E11.9 (VERIO PER PT) 100 strip 3   rOPINIRole (REQUIP) 0.25 MG tablet Take 0.25 mg by mouth at bedtime as needed.  traZODone (DESYREL) 100 MG tablet Take 1.5 tablets (150 mg total) by mouth at bedtime. 45 tablet 5   Turmeric 500 MG CAPS Take 2 capsules by mouth daily.     venlafaxine XR (EFFEXOR-XR) 150 MG 24 hr capsule TAKE 1 CAPSULE BY MOUTH EVERY DAY 90 capsule 1   vitamin B-12 (CYANOCOBALAMIN) 1000 MCG tablet Take 1,000 mcg by mouth daily.     Vitamin D, Cholecalciferol, 25 MCG (1000 UT) TABS Take 1 tablet by mouth daily.     No facility-administered medications prior to visit.    Review of Systems;  Patient denies headache, fevers, malaise, unintentional weight loss, skin rash, eye pain, sinus congestion and sinus pain, sore throat,  dysphagia,  hemoptysis , cough, dyspnea, wheezing, chest pain, palpitations, orthopnea, edema, abdominal pain, nausea, melena, diarrhea, constipation, flank pain, dysuria, hematuria, urinary  Frequency, nocturia, numbness, tingling, seizures,  Focal weakness, Loss of consciousness,  Tremor, insomnia, depression, anxiety, and suicidal ideation.      Objective:  BP 110/66 (BP Location: Right Arm, Patient Position: Sitting, Cuff Size: Normal)   Pulse 66   Temp 97.9 F (36.6 C) (Oral)   Ht '5\' 6"'  (1.676 m)   Wt 147 lb (66.7 kg)   SpO2 95%   BMI 23.73 kg/m   BP Readings from Last 3 Encounters:  04/04/22 110/66  09/30/21 120/70  06/28/21 128/72    Wt Readings from Last 3 Encounters:  04/04/22 147 lb (66.7 kg)  10/05/21 158 lb (71.7 kg)  09/30/21 161 lb 12.8 oz (73.4 kg)    General appearance: alert, cooperative and appears stated age Ears: normal TM's and external ear canals both ears Throat: lips, mucosa, and tongue normal; teeth and gums normal Neck: no adenopathy, no carotid bruit, supple, symmetrical, trachea midline and thyroid not enlarged, symmetric, no tenderness/mass/nodules Back: symmetric, no curvature. ROM normal. No CVA tenderness. Lungs: clear to auscultation bilaterally Heart: regular rate and rhythm, S1, S2 normal, no murmur, click, rub or gallop Abdomen: soft, non-tender; bowel sounds normal; no masses,  no organomegaly Pulses: 2+ and symmetric Skin: Skin color, texture, turgor normal. No rashes or lesions Lymph nodes: Cervical, supraclavicular, and axillary nodes normal. Neuro:  awake and interactive with normal mood and affect. Higher cortical functions are normal. Speech is clear without word-finding difficulty or dysarthria. Extraocular movements are intact. Visual fields of both eyes are grossly intact. Sensation to light touch is grossly intact bilaterally of upper and lower extremities. Motor examination shows 4+/5 symmetric hand grip and upper extremity and 5/5  lower extremity strength. There is no pronation or drift. Gait is non-ataxic   Lab Results  Component Value Date   HGBA1C 6.0 09/30/2021   HGBA1C 6.2 05/07/2021   HGBA1C 6.4 01/28/2021    Lab Results  Component Value Date   CREATININE 1.08 09/30/2021   CREATININE 1.18 (H) 06/22/2021   CREATININE 1.20 05/07/2021    Lab Results  Component Value Date   WBC 4.8 06/22/2021   HGB 13.1 06/22/2021   HCT 39.4 06/22/2021   PLT 246 06/22/2021   GLUCOSE 95 09/30/2021   CHOL 115 09/30/2021   TRIG 112.0 09/30/2021   HDL 47.50 09/30/2021   LDLDIRECT 49.0 09/30/2021   LDLCALC 45 09/30/2021   ALT 21 09/30/2021   AST 23 09/30/2021   NA 139 09/30/2021   K 3.9 09/30/2021   CL 100 09/30/2021   CREATININE 1.08 09/30/2021   BUN 12 09/30/2021   CO2 33 (H) 09/30/2021   TSH 1.36 06/28/2021  HGBA1C 6.0 09/30/2021   MICROALBUR <0.7 09/30/2021    CT CHEST LUNG CA SCREEN LOW DOSE W/O CM  Result Date: 10/06/2021 CLINICAL DATA:  Current smoker with 77 pack-year history EXAM: CT CHEST WITHOUT CONTRAST LOW-DOSE FOR LUNG CANCER SCREENING TECHNIQUE: Multidetector CT imaging of the chest was performed following the standard protocol without IV contrast. RADIATION DOSE REDUCTION: This exam was performed according to the departmental dose-optimization program which includes automated exposure control, adjustment of the mA and/or kV according to patient size and/or use of iterative reconstruction technique. COMPARISON:  Multiple priors, most recent CT lung cancer screening dated June 24, 2020 FINDINGS: Cardiovascular: Normal heart size. No pericardial effusion. Lad and RCA coronary artery calcifications. Atherosclerotic disease of the thoracic aorta. Mediastinum/Nodes: Esophagus is unremarkable. No pathologically enlarged lymph nodes seen in the chest. Lungs/Pleura: Central airways are patent. Mild paraseptal emphysema. No consolidation, pleural effusion or pneumothorax. New small solid nodules measuring  less than 4 mm. Reference nodule the left lower lobe measuring 2.4 mm in mean diameter on image 207. Other previously seen nodules are stable. Upper Abdomen: No acute abnormality. Musculoskeletal: No chest wall mass or suspicious bone lesions identified. IMPRESSION: 1. Lung-RADS 2, benign appearance or behavior. Continue annual screening with low-dose chest CT without contrast in 12 months. 2. Coronary artery calcifications, aortic Atherosclerosis (ICD10-I70.0) and Emphysema (ICD10-J43.9). Electronically Signed   By: Yetta Glassman M.D.   On: 10/06/2021 13:27   Assessment & Plan:   Problem List Items Addressed This Visit     Type 2 diabetes mellitus with diabetic chronic kidney disease ([place])    Currently well-controlled on current medications including Farxiga  .  hemoglobin A1c has been at goal of less than 7.0 . Patient is reminded to schedule an annual eye exam and foot exam is normal today. Patient has no microalbuminuria and BP is well controlled on losartan  Patient is tolerating statin therapy for CAD risk reduction and on ACE/ARB for renal protection and hypertension        Relevant Orders   Comp Met (CMET)   HgB A1c   Overweight (BMI 25.0-29.9)    She has> 40 lbs since Sept 2022       OSA on CPAP    Diagnosed remotely by sleep study. She states that she is  wearing her CPAP every night a minimum of 6 hours per night and notes improved daytime wakefulness and decreased fatigue       Hypothyroidism   Relevant Orders   TSH   Hyperlipidemia associated with type 2 diabetes mellitus ([place])   Relevant Orders   Lipid Profile   Direct LDL   Headache, new daily persistent ([facility])    MRI brain was done Sept 2022: no strokes or masses.  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  She has quit smoking since then and the headaches have improved.       Essential hypertension - Primary    Well controlled on current regimen of losartan 100 mg daily . Renal function stable, no  changes today.      Relevant Orders   Comp Met (CMET)   COPD (chronic obstructive pulmonary disease) with emphysema ([place])    Asymptomatic but lives a sedentary lifestyle. She has  quit smoking      Aortic atherosclerosis ([place])      Patient is  tolerating high potency statin therapy       Other Visit Diagnoses     Need for immunization against influenza  Relevant Orders   Flu Vaccine QUAD High Dose(Fluad) (Completed)   Urine discoloration       Relevant Orders   Urinalysis, Routine w reflex microscopic (Completed)   Urine Culture       I spent a total of 30 minutes with this patient in a face to face visit on the date of this encounter reviewing the last office visit with me in   March,  most recent visit with cardiology ,    ,  patient's diet and exercise habits, home blood pressure /blood sugar readings,   and post visit ordering of testing and therapeutics.    Follow-up: Return in about 3 months (around 07/04/2022) for follow up diabetes.   [name], MD

## 2022-04-04 NOTE — Patient Instructions (Addendum)
This is  an [place] for you!  "Moderation in all things, even diet "  Congratulations on your great granddaughter   Flu vaccine today.  RSV in 2 weeks (at pharmacy) "

## 2022-04-04 NOTE — Assessment & Plan Note (Signed)
Patient is  tolerating high potency statin therapy

## 2022-04-04 NOTE — Assessment & Plan Note (Signed)
Currently well-controlled on current medications including Farxiga  .  hemoglobin A1c has been at goal of less than 7.0 . Patient is reminded to schedule an annual eye exam and foot exam is normal today. Patient has no microalbuminuria and BP is well controlled on losartan  Patient is tolerating statin therapy for CAD risk reduction and on ACE/ARB for renal protection and hypertension

## 2022-04-04 NOTE — Assessment & Plan Note (Addendum)
Asymptomatic but lives a sedentary lifestyle. She has  quit smoking

## 2022-04-04 NOTE — Assessment & Plan Note (Signed)
Well controlled on current regimen of losartan 100 mg daily . Renal function stable, no changes today.

## 2022-04-04 NOTE — Assessment & Plan Note (Signed)
She has> 40 lbs since Sept 2022

## 2022-04-04 NOTE — Assessment & Plan Note (Signed)
Diagnosed remotely by sleep study. She states that she is  wearing her CPAP every night a minimum of 6 hours per night and notes improved daytime wakefulness and decreased fatigue

## 2022-04-04 NOTE — Assessment & Plan Note (Signed)
MRI brain was done Sept 2022: no strokes or masses.  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  She has quit smoking since then and the headaches have improved.

## 2022-04-05 LAB — URINE CULTURE
MICRO NUMBER:: 13963260
Result:: NO GROWTH
SPECIMEN QUALITY:: ADEQUATE

## 2022-04-05 LAB — HEMOGLOBIN A1C: Hgb A1c MFr Bld: 5.7 % (ref 4.6–6.5)

## 2022-04-06 ENCOUNTER — Ambulatory Visit (INDEPENDENT_AMBULATORY_CARE_PROVIDER_SITE_OTHER): Payer: Medicare Other

## 2022-04-06 DIAGNOSIS — Z0000 Encounter for general adult medical examination without abnormal findings: Secondary | ICD-10-CM

## 2022-04-06 NOTE — Patient Instructions (Signed)
Crystal Meyers , Thank you for taking time to come for your Medicare Wellness Visit. I appreciate your ongoing commitment to your health goals. Please review the following plan we discussed and let me know if I can assist you in the future.   These are the goals we discussed:  Goals       Increase physical activity (pt-stated)      start walking for exercise      Medication Monitoring (pt-stated)      Patient Goals/Self-Care Activities Over the next 90 days, patient will:  - take medications as prescribed check blood pressure periodically, document, and provide at future appointments collaborate with provider on medication access solutions       Patient Stated      Get gfr filtration back up       Quit Smoking      Decrease the amount of cigarettes smoked         This is a list of the screening recommended for you and due dates:  Health Maintenance  Topic Date Due   COVID-19 Vaccine (3 - Pfizer risk series) 04/20/2022*   Complete foot exam   10/01/2022   Hemoglobin A1C  10/03/2022   Yearly kidney health urinalysis for diabetes  10/29/2022   Eye exam for diabetics  11/20/2022   Yearly kidney function blood test for diabetes  04/05/2023   Colon Cancer Screening  04/20/2025   Tetanus Vaccine  04/13/2028   Pneumonia Vaccine  Completed   Flu Shot  Completed   DEXA scan (bone density measurement)  Completed   Hepatitis C Screening: USPSTF Recommendation to screen - Ages 41-79 yo.  Completed   Zoster (Shingles) Vaccine  Completed   HPV Vaccine  Aged Out  *Topic was postponed. The date shown is not the original due date.    Advanced directives: Please bring a copy of your health care power of attorney and living will to the office at your convenience.  Conditions/risks identified: get grf filtration under control  Next appointment: Follow up in one year for your annual wellness visit    Preventive Care 65 Years and Older, Female Preventive care refers to lifestyle choices and  visits with your health care provider that can promote health and wellness. What does preventive care include? A yearly physical exam. This is also called an annual well check. Dental exams once or twice a year. Routine eye exams. Ask your health care provider how often you should have your eyes checked. Personal lifestyle choices, including: Daily care of your teeth and gums. Regular physical activity. Eating a healthy diet. Avoiding tobacco and drug use. Limiting alcohol use. Practicing safe sex. Taking low-dose aspirin every day. Taking vitamin and mineral supplements as recommended by your health care provider. What happens during an annual well check? The services and screenings done by your health care provider during your annual well check will depend on your age, overall health, lifestyle risk factors, and family history of disease. Counseling  Your health care provider may ask you questions about your: Alcohol use. Tobacco use. Drug use. Emotional well-being. Home and relationship well-being. Sexual activity. Eating habits. History of falls. Memory and ability to understand (cognition). Work and work Statistician. Reproductive health. Screening  You may have the following tests or measurements: Height, weight, and BMI. Blood pressure. Lipid and cholesterol levels. These may be checked every 5 years, or more frequently if you are over 41 years old. Skin check. Lung cancer screening. You may have this  screening every year starting at age 33 if you have a 30-pack-year history of smoking and currently smoke or have quit within the past 15 years. Fecal occult blood test (FOBT) of the stool. You may have this test every year starting at age 72. Flexible sigmoidoscopy or colonoscopy. You may have a sigmoidoscopy every 5 years or a colonoscopy every 10 years starting at age 11. Hepatitis C blood test. Hepatitis B blood test. Sexually transmitted disease (STD)  testing. Diabetes screening. This is done by checking your blood sugar (glucose) after you have not eaten for a while (fasting). You may have this done every 1-3 years. Bone density scan. This is done to screen for osteoporosis. You may have this done starting at age 16. Mammogram. This may be done every 1-2 years. Talk to your health care provider about how often you should have regular mammograms. Talk with your health care provider about your test results, treatment options, and if necessary, the need for more tests. Vaccines  Your health care provider may recommend certain vaccines, such as: Influenza vaccine. This is recommended every year. Tetanus, diphtheria, and acellular pertussis (Tdap, Td) vaccine. You may need a Td booster every 10 years. Zoster vaccine. You may need this after age 26. Pneumococcal 13-valent conjugate (PCV13) vaccine. One dose is recommended after age 62. Pneumococcal polysaccharide (PPSV23) vaccine. One dose is recommended after age 34. Talk to your health care provider about which screenings and vaccines you need and how often you need them. This information is not intended to replace advice given to you by your health care provider. Make sure you discuss any questions you have with your health care provider. Document Released: 07/24/2015 Document Revised: 03/16/2016 Document Reviewed: 04/28/2015 Elsevier Interactive Patient Education  2017 [place] Prevention in the Home Falls can cause injuries. They can happen to people of all ages. There are many things you can do to make your home safe and to help prevent falls. What can I do on the outside of my home? Regularly fix the edges of walkways and driveways and fix any cracks. Remove anything that might make you trip as you walk through a door, such as a raised step or threshold. Trim any bushes or trees on the path to your home. Use bright outdoor lighting. Clear any walking paths of anything that  might make someone trip, such as rocks or tools. Regularly check to see if handrails are loose or broken. Make sure that both sides of any steps have handrails. Any raised decks and porches should have guardrails on the edges. Have any leaves, snow, or ice cleared regularly. Use sand or salt on walking paths during winter. Clean up any spills in your garage right away. This includes oil or grease spills. What can I do in the bathroom? Use night lights. Install grab bars by the toilet and in the tub and shower. Do not use towel bars as grab bars. Use non-skid mats or decals in the tub or shower. If you need to sit down in the shower, use a plastic, non-slip stool. Keep the floor dry. Clean up any water that spills on the floor as soon as it happens. Remove soap buildup in the tub or shower regularly. Attach bath mats securely with double-sided non-slip rug tape. Do not have throw rugs and other things on the floor that can make you trip. What can I do in the bedroom? Use night lights. Make sure that you have a light by your  bed that is easy to reach. Do not use any sheets or blankets that are too big for your bed. They should not hang down onto the floor. Have a firm chair that has side arms. You can use this for support while you get dressed. Do not have throw rugs and other things on the floor that can make you trip. What can I do in the kitchen? Clean up any spills right away. Avoid walking on wet floors. Keep items that you use a lot in easy-to-reach places. If you need to reach something above you, use a strong step stool that has a grab bar. Keep electrical cords out of the way. Do not use floor polish or wax that makes floors slippery. If you must use wax, use non-skid floor wax. Do not have throw rugs and other things on the floor that can make you trip. What can I do with my stairs? Do not leave any items on the stairs. Make sure that there are handrails on both sides of the  stairs and use them. Fix handrails that are broken or loose. Make sure that handrails are as long as the stairways. Check any carpeting to make sure that it is firmly attached to the stairs. Fix any carpet that is loose or worn. Avoid having throw rugs at the top or bottom of the stairs. If you do have throw rugs, attach them to the floor with carpet tape. Make sure that you have a light switch at the top of the stairs and the bottom of the stairs. If you do not have them, ask someone to add them for you. What else can I do to help prevent falls? Wear shoes that: Do not have high heels. Have rubber bottoms. Are comfortable and fit you well. Are closed at the toe. Do not wear sandals. If you use a stepladder: Make sure that it is fully opened. Do not climb a closed stepladder. Make sure that both sides of the stepladder are locked into place. Ask someone to hold it for you, if possible. Clearly mark and make sure that you can see: Any grab bars or handrails. First and last steps. Where the edge of each step is. Use tools that help you move around (mobility aids) if they are needed. These include: Canes. Walkers. Scooters. Crutches. Turn on the lights when you go into a dark area. Replace any light bulbs as soon as they burn out. Set up your furniture so you have a clear path. Avoid moving your furniture around. If any of your floors are uneven, fix them. If there are any pets around you, be aware of where they are. Review your medicines with your doctor. Some medicines can make you feel dizzy. This can increase your chance of falling. Ask your doctor what other things that you can do to help prevent falls. This information is not intended to replace advice given to you by your health care provider. Make sure you discuss any questions you have with your health care provider. Document Released: 04/23/2009 Document Revised: 12/03/2015 Document Reviewed: 08/01/2014 Elsevier Interactive  Patient Education  2017 [employer].

## 2022-04-06 NOTE — Progress Notes (Addendum)
Virtual Visit via Telephone Note  I connected with  Crystal Meyers on 04/06/22 at 11:30 AM EDT by telephone and verified that I am speaking with the correct person using two identifiers.  Medicare Annual Wellness visit completed telephonically due to Covid-19 pandemic.   Persons participating in this call: This Health Coach and this patient.   Location: Patient: home Provider: office   I discussed the limitations, risks, security and privacy concerns of performing an evaluation and management service by telephone and the availability of in person appointments. The patient expressed understanding and agreed to proceed.  Unable to perform video visit due to video visit attempted and failed and/or patient does not have video capability.   Some vital signs may be absent or patient reported.   Willette Brace, LPN   Subjective:   Crystal Meyers is a 76 y.o. female who presents for Medicare Annual (Subsequent) preventive examination.  Review of Systems     Cardiac Risk Factors include: advanced age (>55mn, >>67women)     Objective:    There were no vitals filed for this visit. There is no height or weight on file to calculate BMI.     04/06/2022   11:29 AM 06/22/2021   10:37 AM 04/01/2021   10:27 AM 06/08/2020   10:28 AM 04/20/2020    8:56 AM 03/31/2020    9:56 AM 04/15/2019    2:40 PM  Advanced Directives  Does Patient Have a Medical Advance Directive? _0  Yes Yes  Type of AParamedicof AAltheimerLiving will Living will;Healthcare Power of AWoodburnLiving will HFort ChiswellLiving will Living will HClearwaterLiving will HCarbon HillLiving will  Does patient want to make changes to medical advance directive?  Yes (ED - Information included in AVS) No - Patient declined No - Patient declined  No - Patient declined   Copy of HRooseveltin Chart?  No - copy requested  No - copy requested Yes - validated most recent copy scanned in chart (See row information)  Yes - validated most recent copy scanned in chart (See row information)     Current Medications (verified) Outpatient Encounter Medications as of 04/06/2022  Medication Sig   aspirin EC 81 MG tablet Take 1 tablet (81 mg total) by mouth daily.   atorvastatin (LIPITOR) 40 MG tablet TAKE 1 TABLET BY MOUTH EVERY DAY   B Complex-C (B-COMPLEX WITH VITAMIN C) tablet Take 1 tablet by mouth daily.   blood glucose meter kit and supplies Dispense based on patient and insurance preference. Use to check sugar once daily as directed. (FOR ICD-10 E10.9, E11.9).   dapagliflozin propanediol (FARXIGA) 10 MG TABS tablet Take 1 tablet (10 mg total) by mouth daily before breakfast.   Lancets (ONETOUCH DELICA PLUS [id number] MISC USE TO CHECK SUGAR ONCE DAILY ES06.3(DELICA PER PT)   levothyroxine (SYNTHROID) 88 MCG tablet Take 1 tablet (88 mcg total) by mouth daily before breakfast.   losartan (COZAAR) 100 MG tablet TAKE 1 TABLET BY MOUTH EVERYDAY AT BEDTIME   nicotine polacrilex (NICORETTE) 4 MG gum Take 1 each (4 mg total) by mouth as needed for smoking cessation.   nitroGLYCERIN (NITROSTAT) 0.4 MG SL tablet Place under the tongue.   omeprazole (PRILOSEC) 40 MG capsule TAKE 1 CAPSULE BY MOUTH EVERY DAY   ONETOUCH VERIO test strip USE TO CHECK SUGAR ONCE DAILY E11.9 (VERIO PER PT)  rOPINIRole (REQUIP) 0.25 MG tablet Take 0.25 mg by mouth at bedtime as needed.   traZODone (DESYREL) 100 MG tablet Take 1.5 tablets (150 mg total) by mouth at bedtime.   Turmeric 500 MG CAPS Take 2 capsules by mouth daily.   venlafaxine XR (EFFEXOR-XR) 150 MG 24 hr capsule TAKE 1 CAPSULE BY MOUTH EVERY DAY   vitamin B-12 (CYANOCOBALAMIN) 1000 MCG tablet Take 1,000 mcg by mouth daily.   Vitamin D, Cholecalciferol, 25 MCG (1000 UT) TABS Take 1 tablet by mouth daily.   No facility-administered encounter medications on file as  of 04/06/2022.    Allergies (verified) Lamisil [terbinafine] and No known allergies   History: Past Medical History:  Diagnosis Date   Anxiety    Asthma    Barrett's esophagus    Breast cancer ([place]) 7/10   left , invasive lobular carcinoma   Colon polyps    Complication of anesthesia    difficulty waking up and sleep apnea, low sats   COPD (chronic obstructive pulmonary disease) (HCC)    Coronary artery disease    Depression    GERD (gastroesophageal reflux disease)    Hypercholesteremia    Hypertension    Hypothyroidism    Personal history of tobacco use, presenting hazards to health 05/06/2015   Personal history of tobacco use, presenting hazards to health 05/06/2015   Pneumonia    hx of   PVC (premature ventricular contraction)    Skin cancer    Sleep apnea    wears CPAP nightly   Tobacco abuse 07/27/2011   She resumed smoking after her diagnosis of breast cancer but stopped 3 days ago.    Trigeminal neuralgia    Trigeminal neuralgia    Past Surgical History:  Procedure Laterality Date   BREAST SURGERY Bilateral 2011   dbl mastectomy   COLONOSCOPY W/ POLYPECTOMY     COLONOSCOPY WITH PROPOFOL N/A 04/20/2020   Procedure: COLONOSCOPY WITH PROPOFOL;  Surgeon: Toledo, Benay Pike, MD;  Location: [facility] ENDOSCOPY;  Service: Gastroenterology;  Laterality: N/A;   CRANIECTOMY Right 01/21/2016   Procedure: Microvascular Decompression - right trigemminal nerve;  Surgeon: [name], MD;  Location: [place] [facility];  Service: Neurosurgery;  Laterality: Right;   CRANIOTOMY Right 02/03/2016   Procedure: REPAIR OF CEREBROSPINAL FLUID LEAK AND HARVEST ABDOMINAL FAT GRAFT;  Surgeon: [name], MD;  Location: [place] [facility];  Service: Neurosurgery;  Laterality: Right;   ESOPHAGOGASTRODUODENOSCOPY N/A 04/20/2020   Procedure: ESOPHAGOGASTRODUODENOSCOPY (EGD);  Surgeon: Toledo, Benay Pike, MD;  Location: [facility] ENDOSCOPY;  Service: Gastroenterology;  Laterality: N/A;   LUMBAR  LAMINECTOMY/DECOMPRESSION MICRODISCECTOMY Left 08/11/2015   Procedure: Left Lumbar four-five microdiscectomy;  Surgeon: [name], MD;  Location: [place] [facility];  Service: Neurosurgery;  Laterality: Left;   MASTECTOMY  05/2009   bilateral, reconstructive surgery 09/2009   MASTECTOMY     Bilateral    repair ectropion extensive Bilateral    skin cancer removal     Family History  Problem Relation Age of Onset   Mental illness Mother    Diabetes Mother    Hypertension Mother    Hyperlipidemia Mother    Alzheimer's disease Mother    Hypertension Father    Hyperlipidemia Father    Stroke Father    Heart attack Father    Diabetes Father    Deep vein thrombosis Father    Colon cancer Father    Cancer Brother        Lung   Diabetes Brother    Diabetes Brother  Stroke Maternal Grandmother    Social History   Socioeconomic History   Marital status: Widowed    Spouse name: Not on file   Number of children: Not on file   Years of education: Not on file   Highest education level: Not on file  Occupational History   Not on file  Tobacco Use   Smoking status: Former    Packs/day: 1.00    Years: 54.00    Total pack years: 54.00    Types: Cigarettes    Quit date: 03/31/2021    Years since quitting: 1.0   Smokeless tobacco: Never   Tobacco comments:    Refused cessation material  Vaping Use   Vaping Use: Not on file  Substance and Sexual Activity   Alcohol use: No   Drug use: No   Sexual activity: Never  Other Topics Concern   Not on file  Social History Narrative   Lives with mother.   Recently widowed.   Always uses seat belts   Has a bird and a dog.   No exercise.   Social Determinants of Health   Financial Resource Strain: Low Risk  (04/06/2022)   Overall Financial Resource Strain (CARDIA)    Difficulty of Paying Living Expenses: Not hard at all  Food Insecurity: No Food Insecurity (04/06/2022)   Hunger Vital Sign    Worried About Running Out of Food in  the Last Year: Never true    Ran Out of Food in the Last Year: Never true  Transportation Needs: No Transportation Needs (04/06/2022)   PRAPARE - Hydrologist (Medical): No    Lack of Transportation (Non-Medical): No  Physical Activity: Insufficiently Active (04/06/2022)   Exercise Vital Sign    Days of Exercise per Week: 1 day    Minutes of Exercise per Session: 30 min  Stress: No Stress Concern Present (04/06/2022)   [place]    Feeling of Stress : Not at all  Social Connections: Moderately Isolated (04/06/2022)   Social Connection and Isolation Panel [NHANES]    Frequency of Communication with Friends and Family: More than three times a week    Frequency of Social Gatherings with Friends and Family: More than three times a week    Attends Religious Services: 1 to 4 times per year    Active Member of [employer] or Organizations: No    Attends Archivist Meetings: Never    Marital Status: Widowed    Tobacco Counseling Counseling given: Not Answered Tobacco comments: Refused cessation material   Clinical Intake:  Pre-visit preparation completed: Yes  Pain : No/denies pain     Diabetes: Yes CBG done?: No CBG resulted in Enter/ Edit results?: No Did pt. bring in CBG monitor from home?: No  How often do you need to have someone help you when you read instructions, pamphlets, or other written materials from your doctor or pharmacy?: 1 - Never  Diabetic?Nutrition Risk Assessment:  Has the patient had any N/V/D within the last 2 months?  No  Does the patient have any non-healing wounds?  No  Has the patient had any unintentional weight loss or weight gain?  No   Diabetes:  Is the patient diabetic?  Yes  If diabetic, was a CBG obtained today?  No  Did the patient bring in their glucometer from home?  No  How often do you monitor your CBG's? N/A.   Financial Strains and  Diabetes Management:  Are you having any financial strains with the device, your supplies or your medication? No .  Does the patient want to be seen by Chronic Care Management for management of their diabetes?  No  Would the patient like to be referred to a Nutritionist or for Diabetic Management?  No   Diabetic Exams:  Diabetic Eye Exam: Completed 11/19/21 Diabetic Foot Exam: Completed 09/30/21   Interpreter Needed?: No  Information entered by :: Charlott Rakes, LPN   Activities of Daily Living    04/06/2022   11:30 AM  In your present state of health, do you have any difficulty performing the following activities:  Hearing? 0  Vision? 0  Difficulty concentrating or making decisions? 0  Walking or climbing stairs? 0  Dressing or bathing? 0  Doing errands, shopping? 0  Preparing Food and eating ? N  Using the Toilet? N  In the past six months, have you accidently leaked urine? N  Do you have problems with loss of bowel control? N  Managing your Medications? N  Managing your Finances? N  Housekeeping or managing your Housekeeping? N    Patient Care Team: [name], MD as PCP - General (Internal Medicine) [name], MD (Unknown Physician Specialty)  Indicate any recent Medical Services you may have received from other than [facility] providers in the past year (date may be approximate).     Assessment:   This is a routine wellness examination for Srinidhi.  Hearing/Vision screen Hearing Screening - Comments:: Pt denies any hearing issues  Vision Screening - Comments:: Pt follows up with eye center for annual eye exams   Dietary issues and exercise activities discussed: Current Exercise Habits: Home exercise routine, Type of exercise: walking, Time (Minutes): 30, Frequency (Times/Week): 1, Weekly Exercise (Minutes/Week): 30   Goals Addressed             This Visit's Progress    Patient Stated       Get gfr filtration back up        Depression  Screen    04/06/2022   11:28 AM 04/04/2022   10:11 AM 09/30/2021   10:41 AM 04/01/2021   10:24 AM 03/10/2021    3:12 PM 10/06/2020    2:36 PM 03/31/2020   10:00 AM  PHQ 2/9 Scores  PHQ - 2 Score 0 1 0 0 2 0 0  PHQ- 9 Score 0 3 0 0 10      Fall Risk    04/06/2022   11:30 AM 04/04/2022    9:40 AM 09/30/2021   10:37 AM 04/01/2021   10:28 AM 03/10/2021    3:12 PM  Fall Risk   Falls in the past year? 0 0 0 0 0  Number falls in past yr: 0   0 0  Injury with Fall? 0    0  Risk for fall due to : No Fall Risks;Impaired vision No Fall Risks No Fall Risks    Follow up Falls prevention discussed Falls evaluation completed Falls evaluation completed Falls evaluation completed Falls evaluation completed    [place]:  Any stairs in or around the home? Yes  If so, are there any without handrails? No  Home free of loose throw rugs in walkways, pet beds, electrical cords, etc? Yes  Adequate lighting in your home to reduce risk of falls? Yes   ASSISTIVE DEVICES UTILIZED TO PREVENT FALLS:  Life alert? No  Use of a cane,  walker or w/c? No  Grab bars in the bathroom? No  Shower chair or bench in shower? No  Elevated toilet seat or a handicapped toilet? No   TIMED UP AND GO:  Was the test performed? No .   Cognitive Function:        04/06/2022   11:30 AM 04/01/2021   10:33 AM 03/31/2020   10:26 AM 03/21/2019   11:27 AM 03/15/2018    9:46 AM  6CIT Screen  What Year? 0 points 0 points 0 points 0 points 0 points  What month? 0 points 0 points 0 points 0 points 0 points  What time? 0 points 0 points  0 points 0 points  Count back from 20 0 points 0 points 0 points 0 points 0 points  Months in reverse 0 points 0 points 0 points 0 points 0 points  Repeat phrase 0 points 0 points 0 points 0 points 0 points  Total Score 0 points 0 points  0 points 0 points    Immunizations Immunization History  Administered Date(s) Administered   Fluad Quad(high Dose 65+)  04/10/2019, 04/08/2020, 06/28/2021, 04/04/2022   Influenza, High Dose Seasonal PF 06/09/2017, 03/15/2018   Influenza,inj,Quad PF,6+ Mos 09/05/2016   PFIZER(Purple Top)SARS-COV-2 Vaccination 02/19/2020, 03/13/2020   Pneumococcal Conjugate-13 11/25/2013   Pneumococcal Polysaccharide-23 11/26/2006, 07/03/2015   Tdap 04/13/2018   Zoster Recombinat (Shingrix) 10/11/2021, 01/06/2022    TDAP status: Up to date  Flu Vaccine status: Up to date  Pneumococcal vaccine status: Up to date  Covid-19 vaccine status: Completed vaccines  Qualifies for Shingles Vaccine? Yes   Zostavax completed Yes   Shingrix Completed?: Yes  Screening Tests Health Maintenance  Topic Date Due   COVID-19 Vaccine (3 - Pfizer risk series) 04/20/2022 (Originally 04/10/2020)   FOOT EXAM  10/01/2022   HEMOGLOBIN A1C  10/03/2022   Diabetic kidney evaluation - Urine ACR  10/29/2022   OPHTHALMOLOGY EXAM  11/20/2022   Diabetic kidney evaluation - GFR measurement  04/05/2023   COLONOSCOPY (Pts 45-10yr Insurance coverage will need to be confirmed)  04/20/2025   TETANUS/TDAP  04/13/2028   Pneumonia Vaccine 76 Years old  Completed   INFLUENZA VACCINE  Completed   DEXA SCAN  Completed   Hepatitis C Screening  Completed   Zoster Vaccines- Shingrix  Completed   HPV VACCINES  Aged Out    Health Maintenance  There are no preventive care reminders to display for this patient.  Colorectal cancer screening: Type of screening: Colonoscopy. Completed 04/20/20. Repeat every 5 years  Mammogram status: No longer required due to not a candidate .  Bone density 06/23/20    Additional Screening:  Hepatitis C Screening:  Completed 02/09/15  Vision Screening: Recommended annual ophthalmology exams for early detection of glaucoma and other disorders of the eye. Is the patient up to date with their annual eye exam?  Yes  Who is the provider or what is the name of the office in which the patient attends annual eye exams? [facility]   If pt is not established with a provider, would they like to be referred to a provider to establish care? No .   Dental Screening: Recommended annual dental exams for proper oral hygiene  Community Resource Referral / Chronic Care Management: CRR required this visit?  No   CCM required this visit?  No      Plan:     I have personally reviewed and noted the following in the patient's chart:   Medical and social  history Use of alcohol, tobacco or illicit drugs  Current medications and supplements including opioid prescriptions. Patient is not currently taking opioid prescriptions. Functional ability and status Nutritional status Physical activity Advanced directives List of other physicians Hospitalizations, surgeries, and ER visits in previous 12 months Vitals Screenings to include cognitive, depression, and falls Referrals and appointments  In addition, I have reviewed and discussed with patient certain preventive protocols, quality metrics, and best practice recommendations. A written personalized care plan for preventive services as well as general preventive health recommendations were provided to patient.     Willette Brace, LPN   08/04/869   Nurse Notes: none    I have reviewed the above information and agree with above.   Deborra Medina, MD

## 2022-04-29 ENCOUNTER — Telehealth: Payer: Self-pay | Admitting: Pharmacist

## 2022-04-29 NOTE — Progress Notes (Signed)
[facility] [facility])                                            [employer] Team    04/29/2022  DAVENE JOBIN 12/21/1945 [id number]  Attempted to reach patient to discuss 2024 PAP re-enrollment Farxiga.  No answer and VM is not currently set up.  Will try again at a later date.  Curlene Labrum, PharmD [place] Pharmacist Office: [phone]

## 2022-05-02 ENCOUNTER — Telehealth: Payer: Self-pay | Admitting: Pharmacist

## 2022-05-02 NOTE — Progress Notes (Signed)
[name])                                            [place] Team    05/02/2022  Crystal Meyers 03/04/1946 [id number]  Second attempt to reach patient regarding re-enrollment for Farxiga PAP.  VM not set up, no answer.  Curlene Labrum, PharmD [place] Pharmacist Office: [phone]

## 2022-05-03 ENCOUNTER — Telehealth: Payer: Self-pay | Admitting: Pharmacist

## 2022-05-03 NOTE — Progress Notes (Signed)
[place] [facility])  [place] Team   Reason for referral: 2024 PAP re-enrollment:  [place] referral is being closed due to the following reasons:  -Will close [place] case as I have been unable to engage or maintain contact with patient.  We have attempted to contact the patient on three separate occassions.  [facility] is happy to assist the patient/family in the future for clinical pharmacy needs, following a discussion from your team about [place] outreach. Thank you for allowing [facility] to be a part of your patient's care.   Curlene Labrum, PharmD [place] Pharmacist Office: [phone]

## 2022-05-18 DIAGNOSIS — G4733 Obstructive sleep apnea (adult) (pediatric): Secondary | ICD-10-CM | POA: Diagnosis not present

## 2022-05-18 DIAGNOSIS — E1122 Type 2 diabetes mellitus with diabetic chronic kidney disease: Secondary | ICD-10-CM | POA: Diagnosis not present

## 2022-05-18 DIAGNOSIS — E785 Hyperlipidemia, unspecified: Secondary | ICD-10-CM | POA: Diagnosis not present

## 2022-05-18 DIAGNOSIS — R8290 Unspecified abnormal findings in urine: Secondary | ICD-10-CM | POA: Diagnosis not present

## 2022-05-18 DIAGNOSIS — N1831 Chronic kidney disease, stage 3a: Secondary | ICD-10-CM | POA: Diagnosis not present

## 2022-05-18 DIAGNOSIS — G2581 Restless legs syndrome: Secondary | ICD-10-CM | POA: Diagnosis not present

## 2022-05-18 DIAGNOSIS — F32A Depression, unspecified: Secondary | ICD-10-CM | POA: Diagnosis not present

## 2022-05-18 DIAGNOSIS — I10 Essential (primary) hypertension: Secondary | ICD-10-CM | POA: Diagnosis not present

## 2022-05-18 DIAGNOSIS — J449 Chronic obstructive pulmonary disease, unspecified: Secondary | ICD-10-CM | POA: Diagnosis not present

## 2022-06-17 ENCOUNTER — Other Ambulatory Visit: Payer: Self-pay | Admitting: Internal Medicine

## 2022-07-06 ENCOUNTER — Ambulatory Visit (INDEPENDENT_AMBULATORY_CARE_PROVIDER_SITE_OTHER): Payer: Medicare Other | Admitting: Internal Medicine

## 2022-07-06 ENCOUNTER — Encounter: Payer: Self-pay | Admitting: Internal Medicine

## 2022-07-06 VITALS — BP 100/66 | HR 77 | Temp 98.2°F | Ht 66.0 in | Wt 158.4 lb

## 2022-07-06 DIAGNOSIS — J431 Panlobular emphysema: Secondary | ICD-10-CM

## 2022-07-06 DIAGNOSIS — R918 Other nonspecific abnormal finding of lung field: Secondary | ICD-10-CM

## 2022-07-06 DIAGNOSIS — N182 Chronic kidney disease, stage 2 (mild): Secondary | ICD-10-CM | POA: Diagnosis not present

## 2022-07-06 DIAGNOSIS — E1169 Type 2 diabetes mellitus with other specified complication: Secondary | ICD-10-CM | POA: Diagnosis not present

## 2022-07-06 DIAGNOSIS — E1122 Type 2 diabetes mellitus with diabetic chronic kidney disease: Secondary | ICD-10-CM

## 2022-07-06 DIAGNOSIS — E785 Hyperlipidemia, unspecified: Secondary | ICD-10-CM | POA: Diagnosis not present

## 2022-07-06 DIAGNOSIS — F339 Major depressive disorder, recurrent, unspecified: Secondary | ICD-10-CM

## 2022-07-06 DIAGNOSIS — E663 Overweight: Secondary | ICD-10-CM

## 2022-07-06 DIAGNOSIS — I10 Essential (primary) hypertension: Secondary | ICD-10-CM | POA: Diagnosis not present

## 2022-07-06 LAB — LDL CHOLESTEROL, DIRECT: Direct LDL: 52 mg/dL

## 2022-07-06 LAB — COMPREHENSIVE METABOLIC PANEL
ALT: 21 U/L (ref 0–35)
AST: 20 U/L (ref 0–37)
Albumin: 4.2 g/dL (ref 3.5–5.2)
Alkaline Phosphatase: 40 U/L (ref 39–117)
BUN: 21 mg/dL (ref 6–23)
CO2: 33 mEq/L — ABNORMAL HIGH (ref 19–32)
Calcium: 9.7 mg/dL (ref 8.4–10.5)
Chloride: 100 mEq/L (ref 96–112)
Creatinine, Ser: 1.22 mg/dL — ABNORMAL HIGH (ref 0.40–1.20)
GFR: 43.85 mL/min — ABNORMAL LOW (ref >60.00)
Glucose, Bld: 94 mg/dL (ref 70–99)
Potassium: 4 mEq/L (ref 3.5–5.1)
Sodium: 139 mEq/L (ref 135–145)
Total Bilirubin: 0.3 mg/dL (ref 0.2–1.2)
Total Protein: 6.8 g/dL (ref 6.0–8.3)

## 2022-07-06 LAB — LIPID PANEL
Cholesterol: 140 mg/dL (ref 0–200)
HDL: 58.3 mg/dL (ref >39.00)
LDL Cholesterol: 57 mg/dL (ref 0–99)
NonHDL: 81.72
Total CHOL/HDL Ratio: 2
Triglycerides: 126 mg/dL (ref 0.0–149.0)
VLDL: 25.2 mg/dL (ref 0.0–40.0)

## 2022-07-06 LAB — HEMOGLOBIN A1C: Hgb A1c MFr Bld: 6 % (ref 4.6–6.5)

## 2022-07-06 LAB — MICROALBUMIN / CREATININE URINE RATIO
Creatinine,U: 93.8 mg/dL
Microalb Creat Ratio: 0.7 mg/g (ref 0.0–30.0)
Microalb, Ur: <0.7 mg/dL (ref 0.0–1.9)

## 2022-07-06 NOTE — Assessment & Plan Note (Signed)
She lost > 30 lbs on nephrology's diet and has gained back 11 lbs eating unhealthy choices. Recommending switching to [employer]

## 2022-07-06 NOTE — Assessment & Plan Note (Addendum)
Well controlled on current regimen. Renal function slightly low but stable, no changes today.   Lab Results  Component Value Date   CREATININE 1.22 (H) 07/06/2022   Lab Results  Component Value Date   NA 139 07/06/2022   K 4.0 07/06/2022   CL 100 07/06/2022   CO2 33 (H) 07/06/2022

## 2022-07-06 NOTE — Assessment & Plan Note (Signed)
Currently well-controlled on current medications including Farxiga  .  hemoglobin A1c has been at goal of less than 7.0 . Patient is reminded to schedule an annual eye exam and foot exam is normal today. Patient has no microalbuminuria and BP is well controlled on losartan  Patient is tolerating statin therapy for CAD risk reduction and on ACE/ARB for renal protection and hypertension   Lab Results  Component Value Date   HGBA1C 6.0 07/06/2022

## 2022-07-06 NOTE — Assessment & Plan Note (Signed)
Asymptomatic but lives a sedentary lifestyle. She has not smoked in 15 months  referral to pulmonary clinic in progress

## 2022-07-06 NOTE — Progress Notes (Signed)
Subjective:  Patient ID: Crystal Meyers, female    DOB: 11-Sep-1945  Age: 76 y.o. MRN: [id number]  CC: The primary encounter diagnosis was Essential hypertension. Diagnoses of Hyperlipidemia associated with type 2 diabetes mellitus ([place]), Type 2 diabetes mellitus with stage 2 chronic kidney disease, without long-term current use of insulin ([place]), Major depressive disorder, recurrent episode with melancholic features ([place]), Multiple pulmonary nodules, Overweight (BMI 25.0-29.9), and Panlobular emphysema ([place]) were also pertinent to this visit.   HPI Crystal Meyers presents for  Chief Complaint  Patient presents with   Medical Management of Chronic Issues    Diabetes, hypertension, hyperlipidemia, hypothyroidism   1) T2DM:  She  feels generally well,  But is not  exercising regularly or trying to lose weight. Checking  blood sugars less than once daily at variable times, usually only if she feels she may be having a hypoglycemic event. .  BS have been under 130 fasting and < 150 post prandially.  Denies any recent hypoglyemic events.  Taking   medications as directed. Following a carbohydrate modified diet 6 days per week. Denies numbness, burning and tingling of extremities. Appetite is good.    2)  Weight loss: secondary to nephrologist's strict dietary recommendations (she was seeing Korapati who has since retired )   Reached a nadir of 138 lbs at last visit   : has gained 11 lbs adding 1200 cal daily in the form of [employer] Frappe's (600 cal each!)  still avoiding sweets but eating corn flakes and potatoes   3) Constipation:  taking 2 colace at night.  Bowels move at bets every 3 days and having to strain   4) multiple pulmonary nodules.  Overdue for nov 2023 CT scan .  Was not called ;   last smoke 15 months ago    Outpatient Medications Prior to Visit  Medication Sig Dispense Refill   aspirin EC 81 MG tablet Take 1 tablet (81 mg total) by mouth daily. 90 tablet 1   atorvastatin  (LIPITOR) 40 MG tablet TAKE 1 TABLET BY MOUTH EVERY DAY 90 tablet 1   B Complex-C (B-COMPLEX WITH VITAMIN C) tablet Take 1 tablet by mouth daily.     blood glucose meter kit and supplies Dispense based on patient and insurance preference. Use to check sugar once daily as directed. (FOR ICD-10 E10.9, E11.9). 1 each 0   dapagliflozin propanediol (FARXIGA) 10 MG TABS tablet Take 1 tablet (10 mg total) by mouth daily before breakfast. 90 tablet 3   Lancets (ONETOUCH DELICA PLUS IRCVEL38B) MISC USE TO CHECK SUGAR ONCE DAILY O17.5 (DELICA PER PT) 102 each 3   levothyroxine (SYNTHROID) 88 MCG tablet Take 1 tablet (88 mcg total) by mouth daily before breakfast. 90 tablet 3   losartan (COZAAR) 100 MG tablet TAKE 1 TABLET BY MOUTH EVERYDAY AT BEDTIME 90 tablet 3   nicotine polacrilex (NICORETTE) 2 MG gum Take 2 mg by mouth as needed for smoking cessation.     nitroGLYCERIN (NITROSTAT) 0.4 MG SL tablet Place under the tongue.     omeprazole (PRILOSEC) 40 MG capsule TAKE 1 CAPSULE BY MOUTH EVERY DAY 90 capsule 1   ONETOUCH VERIO test strip USE TO CHECK SUGAR ONCE DAILY E11.9 (VERIO PER PT) 100 strip 3   traZODone (DESYREL) 100 MG tablet Take 1.5 tablets (150 mg total) by mouth at bedtime. 45 tablet 5   Turmeric 500 MG CAPS Take 2 capsules by mouth daily.     venlafaxine XR (EFFEXOR-XR)  150 MG 24 hr capsule TAKE 1 CAPSULE BY MOUTH EVERY DAY 90 capsule 1   vitamin B-12 (CYANOCOBALAMIN) 1000 MCG tablet Take 1,000 mcg by mouth daily.     Vitamin D, Cholecalciferol, 25 MCG (1000 UT) TABS Take 1 tablet by mouth daily.     rOPINIRole (REQUIP) 0.25 MG tablet Take 0.25 mg by mouth at bedtime as needed. (Patient not taking: Reported on 07/06/2022)     nicotine polacrilex (NICORETTE) 4 MG gum Take 1 each (4 mg total) by mouth as needed for smoking cessation. (Patient not taking: Reported on 07/06/2022) 100 tablet 2   No facility-administered medications prior to visit.    Review of Systems;  Patient denies  headache, fevers, malaise, unintentional weight loss, skin rash, eye pain, sinus congestion and sinus pain, sore throat, dysphagia,  hemoptysis , cough, dyspnea, wheezing, chest pain, palpitations, orthopnea, edema, abdominal pain, nausea, melena, diarrhea, constipation, flank pain, dysuria, hematuria, urinary  Frequency, nocturia, numbness, tingling, seizures,  Focal weakness, Loss of consciousness,  Tremor, insomnia, depression, anxiety, and suicidal ideation.      Objective:  BP 100/66   Pulse 77   Temp 98.2 F (36.8 C) (Oral)   Ht _0  (1.676 m)   Wt 158 lb 6.4 oz (71.8 kg)   SpO2 96%   BMI 25.57 kg/m   BP Readings from Last 3 Encounters:  07/06/22 100/66  04/04/22 110/66  09/30/21 120/70    Wt Readings from Last 3 Encounters:  07/06/22 158 lb 6.4 oz (71.8 kg)  04/04/22 147 lb (66.7 kg)  10/05/21 158 lb (71.7 kg)    Physical Exam Vitals reviewed.  Constitutional:      General: She is not in acute distress.    Appearance: Normal appearance. She is normal weight. She is not ill-appearing, toxic-appearing or diaphoretic.  HENT:     Head: Normocephalic.  Eyes:     General: No scleral icterus.       Right eye: No discharge.        Left eye: No discharge.     Conjunctiva/sclera: Conjunctivae normal.  Cardiovascular:     Rate and Rhythm: Normal rate and regular rhythm.     Heart sounds: Normal heart sounds.  Pulmonary:     Effort: Pulmonary effort is normal. No respiratory distress.     Breath sounds: Normal breath sounds.  Musculoskeletal:        General: Normal range of motion.  Skin:    General: Skin is warm and dry.  Neurological:     General: No focal deficit present.     Mental Status: She is alert and oriented to person, place, and time. Mental status is at baseline.  Psychiatric:        Mood and Affect: Mood normal.        Behavior: Behavior normal.        Thought Content: Thought content normal.        Judgment: Judgment normal.     Lab Results   Component Value Date   HGBA1C 6.0 07/06/2022   HGBA1C 5.7 04/04/2022   HGBA1C 6.0 09/30/2021    Lab Results  Component Value Date   CREATININE 1.22 (H) 07/06/2022   CREATININE 1.09 04/04/2022   CREATININE 1.08 09/30/2021    Lab Results  Component Value Date   WBC 4.8 06/22/2021   HGB 13.1 06/22/2021   HCT 39.4 06/22/2021   PLT 246 06/22/2021   GLUCOSE 94 07/06/2022   CHOL 140 07/06/2022   TRIG 126.0  07/06/2022   HDL 58.30 07/06/2022   LDLDIRECT 52.0 07/06/2022   LDLCALC 57 07/06/2022   ALT 21 07/06/2022   AST 20 07/06/2022   NA 139 07/06/2022   K 4.0 07/06/2022   CL 100 07/06/2022   CREATININE 1.22 (H) 07/06/2022   BUN 21 07/06/2022   CO2 33 (H) 07/06/2022   TSH 2.89 04/04/2022   HGBA1C 6.0 07/06/2022   MICROALBUR <0.7 07/06/2022    CT CHEST LUNG CA SCREEN LOW DOSE W/O CM  Result Date: 10/06/2021 CLINICAL DATA:  Current smoker with 77 pack-year history EXAM: CT CHEST WITHOUT CONTRAST LOW-DOSE FOR LUNG CANCER SCREENING TECHNIQUE: Multidetector CT imaging of the chest was performed following the standard protocol without IV contrast. RADIATION DOSE REDUCTION: This exam was performed according to the departmental dose-optimization program which includes automated exposure control, adjustment of the mA and/or kV according to patient size and/or use of iterative reconstruction technique. COMPARISON:  Multiple priors, most recent CT lung cancer screening dated June 24, 2020 FINDINGS: Cardiovascular: Normal heart size. No pericardial effusion. Lad and RCA coronary artery calcifications. Atherosclerotic disease of the thoracic aorta. Mediastinum/Nodes: Esophagus is unremarkable. No pathologically enlarged lymph nodes seen in the chest. Lungs/Pleura: Central airways are patent. Mild paraseptal emphysema. No consolidation, pleural effusion or pneumothorax. New small solid nodules measuring less than 4 mm. Reference nodule the left lower lobe measuring 2.4 mm in mean diameter on  image 207. Other previously seen nodules are stable. Upper Abdomen: No acute abnormality. Musculoskeletal: No chest wall mass or suspicious bone lesions identified. IMPRESSION: 1. Lung-RADS 2, benign appearance or behavior. Continue annual screening with low-dose chest CT without contrast in 12 months. 2. Coronary artery calcifications, aortic Atherosclerosis (ICD10-I70.0) and Emphysema (ICD10-J43.9). Electronically Signed   By: Yetta Glassman M.D.   On: 10/06/2021 13:27   Assessment & Plan:  .Essential hypertension Assessment & Plan: Well controlled on current regimen. Renal function slightly low but stable, no changes today.   Lab Results  Component Value Date   CREATININE 1.22 (H) 07/06/2022   Lab Results  Component Value Date   NA 139 07/06/2022   K 4.0 07/06/2022   CL 100 07/06/2022   CO2 33 (H) 07/06/2022     Orders: -     Comprehensive metabolic panel  Hyperlipidemia associated with type 2 diabetes mellitus ([place]) -     Lipid panel -     LDL cholesterol, direct  Type 2 diabetes mellitus with stage 2 chronic kidney disease, without long-term current use of insulin ([place]) Assessment & Plan: Currently well-controlled on current medications including Farxiga  .  hemoglobin A1c has been at goal of less than 7.0 . Patient is reminded to schedule an annual eye exam and foot exam is normal today. Patient has no microalbuminuria and BP is well controlled on losartan  Patient is tolerating statin therapy for CAD risk reduction and on ACE/ARB for renal protection and hypertension   Lab Results  Component Value Date   HGBA1C 6.0 07/06/2022     Orders: -     Hemoglobin A1c -     Comprehensive metabolic panel -     Microalbumin / creatinine urine ratio  Major depressive disorder, recurrent episode with melancholic features ([place])  Multiple pulmonary nodules Assessment & Plan: She is maintaining annual surveillance with Lung CT which was doe in March 2023  she appears to have  forgotten this. Referral to pulmonary clinic has been made.    Overweight (BMI 25.0-29.9) Assessment & Plan: She lost >  30 lbs on nephrology's diet and has gained back 11 lbs eating unhealthy choices. Recommending switching to Premier Protein    Panlobular emphysema [facility]) Assessment & Plan: Asymptomatic but lives a sedentary lifestyle. She has not smoked in 15 months  referral to pulmonary clinic in progress      I provided 40 minutes of face-to-face time during this encounter reviewing patient's last visit with me, patient's  most recent visit with nephrology  and neurology,  recent surgical and non surgical procedures, previous  labs and imaging studies, counseling on currently addressed issues,  and post visit ordering to diagnostics and therapeutics .   Follow-up: Return in about 6 months (around 01/05/2023).   [name], MD

## 2022-07-06 NOTE — Assessment & Plan Note (Addendum)
She is maintaining annual surveillance with Lung CT which was doe in March 2023  she appears to have forgotten this. Referral to pulmonary clinic has been made.

## 2022-07-06 NOTE — Patient Instructions (Addendum)
Stop the Frappe shakes ! They are too high in sugar and calories   You might want to try a premixed protein drink called Premier Protein shake in the morning.  It is less $$$ and very low sugar.    Keep in refrigerator ,   add with ice to blender if you want the consistency of a Frappe    Try the caramel and the cafe late flavors :  160 cal  30 g protein  1 g sugar 50% calcium needs    Ok to add Citrucel, Benefiber or Metamucil DAILY in your daily coffee .    Dom't forget glycerin suppositories to prevent rectal irritation /pain

## 2022-08-19 ENCOUNTER — Telehealth: Payer: Self-pay | Admitting: Internal Medicine

## 2022-08-19 NOTE — Telephone Encounter (Signed)
Pt called stating Crystal Meyers is sending a fax to the provider to order all her prescriptions

## 2022-08-23 ENCOUNTER — Telehealth: Payer: Self-pay

## 2022-08-23 ENCOUNTER — Other Ambulatory Visit: Payer: Medicare Other | Admitting: Pharmacist

## 2022-08-23 DIAGNOSIS — E1122 Type 2 diabetes mellitus with diabetic chronic kidney disease: Secondary | ICD-10-CM

## 2022-08-23 MED ORDER — VENLAFAXINE HCL ER 150 MG PO CP24: ORAL_CAPSULE | ORAL | 2 refills | Status: DC

## 2022-08-23 MED ORDER — TRAZODONE HCL 100 MG PO TABS: 150.0000 mg | ORAL_TABLET | Freq: Every day | ORAL | 5 refills | Status: DC

## 2022-08-23 MED ORDER — LEVOTHYROXINE SODIUM 88 MCG PO TABS: 88.0000 ug | ORAL_TABLET | Freq: Every day | ORAL | 2 refills | Status: DC

## 2022-08-23 MED ORDER — OMEPRAZOLE 40 MG PO CPDR: 40.0000 mg | DELAYED_RELEASE_CAPSULE | Freq: Every day | ORAL | 2 refills | Status: DC

## 2022-08-23 MED ORDER — DAPAGLIFLOZIN PROPANEDIOL 10 MG PO TABS: 10.0000 mg | ORAL_TABLET | Freq: Every day | ORAL | 3 refills | Status: DC

## 2022-08-23 MED ORDER — ATORVASTATIN CALCIUM 40 MG PO TABS: 40.0000 mg | ORAL_TABLET | Freq: Every day | ORAL | 2 refills | Status: DC

## 2022-08-23 MED ORDER — LOSARTAN POTASSIUM 100 MG PO TABS: ORAL_TABLET | ORAL | 2 refills | Status: DC

## 2022-08-23 NOTE — Telephone Encounter (Signed)
All prescriptions have been sent to [facility] Mail order pharmacy. Pt is aware.

## 2022-08-23 NOTE — Progress Notes (Signed)
Received call back from patient. We need to reapply for [place] assistance through [employer]. Will collaborate with pharmacy technician team on paperwork.   Catie Hedwig Morton, PharmD, [place], [facility] [phone]

## 2022-08-23 NOTE — Telephone Encounter (Signed)
Spoke with pt and she stated that she is down to her last bottle of Wilder Glade and stated that she gets in through pt assistance. Can you help me with this or let me know who I need to contact.

## 2022-08-23 NOTE — Progress Notes (Signed)
Care Coordination Call  Received message from clinic that patient was running low on Farxiga - previously received from [employer] patient assistance. [facility] team attempted to contact patient in October for re-enrollment.   In some instances, [employer] automatically re-enrolled patients for 2024. Updated prescription for Farxiga sent to [facility], who dispenses for [employer].   Will ask patient to contact [place] Patient Assistance [phone] to request refill and determine if new application is needed. If so, we will work with the pharmacy technician team to complete re-enrollment application.   Catie Hedwig Morton, PharmD, [place], [facility] [phone]

## 2022-08-23 NOTE — Telephone Encounter (Signed)
No thank you so much!

## 2022-08-24 ENCOUNTER — Other Ambulatory Visit (HOSPITAL_COMMUNITY): Payer: Self-pay

## 2022-08-24 NOTE — Progress Notes (Signed)
Mailed renewal PAP application FOR FARXIGA to patient home with return envelope and fax number. I will be faxing provider portion when I receive pt portion.   Thanks,    [facility] Patient Advocate

## 2022-08-28 ENCOUNTER — Other Ambulatory Visit: Payer: Self-pay | Admitting: Family

## 2022-09-02 ENCOUNTER — Telehealth: Payer: Self-pay

## 2022-09-02 DIAGNOSIS — E1122 Type 2 diabetes mellitus with diabetic chronic kidney disease: Secondary | ICD-10-CM

## 2022-09-02 NOTE — Telephone Encounter (Signed)
I have received pt portion for PAP FOR [place] faxing provider portion to [phone] attention [employer] I hope that is the right CMA.  Please have Dr. Derrel Nip to review, fill in, sign, and send back to me [place]!  [facility] Patient Advocate [phone]

## 2022-09-02 NOTE — Telephone Encounter (Signed)
Pt called to let me know she would be emailing me her PAP APPLICATION for [place] [facility]

## 2022-09-02 NOTE — Telephone Encounter (Signed)
Waiting on fax

## 2022-09-05 NOTE — Telephone Encounter (Signed)
We did not receive.

## 2022-09-06 NOTE — Telephone Encounter (Signed)
RESENT FAX FOR MD TO REVIEW, FILL OUT IN FULL AND SEND BACK FOROR FARXIGA PLEASE LET ME KNOW IF RECEIVE IT THANKS AGAIN!!!  [facility] Patient Advocate

## 2022-09-06 NOTE — Telephone Encounter (Signed)
We have received waiting for Dr. Derrel Nip to sign.

## 2022-09-08 NOTE — Telephone Encounter (Signed)
I RECEIVED provider portion and Submitted application for FARXIGA to [employer] for patient assistance.   Phone: [phone]  [facility] Patient Advocate

## 2022-09-08 NOTE — Telephone Encounter (Signed)
noted 

## 2022-09-13 NOTE — Telephone Encounter (Signed)
noted 

## 2022-09-13 NOTE — Telephone Encounter (Signed)
Received notification from [employer] regarding approval for [facility]. Patient assistance approved from 09/13/2022 to 07/11/2023.  Phone: [facility] Patient Advocate

## 2022-09-19 ENCOUNTER — Telehealth: Payer: Self-pay | Admitting: Internal Medicine

## 2022-09-19 NOTE — Telephone Encounter (Signed)
Patient called and stated she needs samples of Farxiga.

## 2022-09-20 NOTE — Telephone Encounter (Signed)
Spoke with pt and she does get through patient assistance program but she has not received them yet. Pt spoke with the company and they stated that she has been approved and it may take 7 to 10 days before she will receive the medication in the mail. Pt is requesting samples to get her through however we do not have any 10 mg and we only have 1 box of the 5 mg which only has 7 tablets in it.

## 2022-09-21 NOTE — Telephone Encounter (Signed)
Pt is aware and stated she would come by today to pick up.   Medication Samples have been provided to the patient.  Drug name: Wilder Glade       Strength: 5 mg        Qty: 1 box  LOT: [id number]  Exp.Date: 11/07/2024  Dosing instructions: Take 2 tablets(10 mg) by mouth daily.   The patient has been instructed regarding the correct time, dose, and frequency of taking this medication, including desired effects and most common side effects.   Crystal Meyers 10:50 AM 09/21/2022

## 2022-10-06 ENCOUNTER — Ambulatory Visit
Admission: RE | Admit: 2022-10-06 | Discharge: 2022-10-06 | Disposition: A | Discharge status: Home / Self Care | Payer: Medicare HMO | Source: Ambulatory Visit | Attending: Acute Care | Admitting: Acute Care

## 2022-10-06 DIAGNOSIS — Z87891 Personal history of nicotine dependence: Secondary | ICD-10-CM | POA: Insufficient documentation

## 2022-10-10 ENCOUNTER — Other Ambulatory Visit: Payer: Self-pay

## 2022-10-10 DIAGNOSIS — Z87891 Personal history of nicotine dependence: Secondary | ICD-10-CM

## 2022-10-11 ENCOUNTER — Telehealth: Payer: Self-pay

## 2022-10-11 ENCOUNTER — Other Ambulatory Visit: Payer: Self-pay

## 2022-10-11 ENCOUNTER — Telehealth (INDEPENDENT_AMBULATORY_CARE_PROVIDER_SITE_OTHER): Payer: Medicare HMO | Admitting: Nurse Practitioner

## 2022-10-11 DIAGNOSIS — R6889 Other general symptoms and signs: Secondary | ICD-10-CM | POA: Diagnosis not present

## 2022-10-11 DIAGNOSIS — J069 Acute upper respiratory infection, unspecified: Secondary | ICD-10-CM

## 2022-10-11 DIAGNOSIS — H1033 Unspecified acute conjunctivitis, bilateral: Secondary | ICD-10-CM

## 2022-10-11 DIAGNOSIS — Z1152 Encounter for screening for COVID-19: Secondary | ICD-10-CM

## 2022-10-11 LAB — POC COVID19 BINAXNOW: SARS Coronavirus 2 Ag: NEGATIVE

## 2022-10-11 LAB — POCT INFLUENZA A/B
Influenza A, POC: NEGATIVE
Influenza B, POC: NEGATIVE

## 2022-10-11 MED ORDER — NEOMYCIN-POLYMYXIN-DEXAMETH 3.5-10000-0.1 OP SUSP: OPHTHALMIC | 0 refills | Status: DC

## 2022-10-11 NOTE — Progress Notes (Unsigned)
MyChart Video Visit    Virtual Visit via Video Note   This visit type was conducted because this format is felt to be most appropriate for this patient at this time. Physical exam was limited by quality of the video and audio technology used for the visit. CMA was able to get the patient set up on a video visit.  Patient location: Car, parked outside office. Patient and provider in visit Provider location: Office  I discussed the limitations of evaluation and management by telemedicine and the availability of in person appointments. The patient expressed understanding and agreed to proceed.  Visit Date: 10/11/2022  Today's healthcare provider: Tomasita Morrow, NP     Subjective:    Patient ID: Crystal Meyers, female    DOB: 12-Aug-1945, 77 y.o.   MRN: [id number]  Chief Complaint  Patient presents with   Nasal Congestion    Weak feeling, lower back hurts, for 3 days been unable to do much, no chest pain both eyes are red. Nose bleeds, nasal drainage sore throat    HPI Patient reports symptoms beginning 3 days ago. Reports over the past 2 days her eyes have become very red, are burning and have a lot of yellow discharge. They are matted shut in the mornings. It started in one eye and has spread to the other. She denies vision problems.  Respiratory illness:  Cough- No  Congestion-    Sinus- Yes   Chest- No  Post nasal drip- Yes  Sore throat- Yes  Shortness of breath- No  Fever- No  Fatigue/Myalgia- Yes Headache- No Nausea/Vomiting- No Taste disturbance- No  Smell disturbance- No  Covid exposure- No  Covid vaccination- x 2  Flu vaccination- UTD  Medications- None   Past Medical History:  Diagnosis Date   Anxiety    Asthma    Barrett's esophagus    Breast cancer ([place]) 7/10   left , invasive lobular carcinoma   Colon polyps    Complication of anesthesia    difficulty waking up and sleep apnea, low sats   COPD (chronic obstructive pulmonary disease) (HCC)     Coronary artery disease    Depression    GERD (gastroesophageal reflux disease)    Hypercholesteremia    Hypertension    Hypothyroidism    Personal history of tobacco use, presenting hazards to health 05/06/2015   Personal history of tobacco use, presenting hazards to health 05/06/2015   Pneumonia    hx of   PVC (premature ventricular contraction)    Skin cancer    Sleep apnea    wears CPAP nightly   Tobacco abuse 07/27/2011   She resumed smoking after her diagnosis of breast cancer but stopped 3 days ago.    Trigeminal neuralgia    Trigeminal neuralgia     Past Surgical History:  Procedure Laterality Date   BREAST SURGERY Bilateral 2011   dbl mastectomy   COLONOSCOPY W/ POLYPECTOMY     COLONOSCOPY WITH PROPOFOL N/A 04/20/2020   Procedure: COLONOSCOPY WITH PROPOFOL;  Surgeon: Toledo, Benay Pike, MD;  Location: [facility] ENDOSCOPY;  Service: Gastroenterology;  Laterality: N/A;   CRANIECTOMY Right 01/21/2016   Procedure: Microvascular Decompression - right trigemminal nerve;  Surgeon: [name], MD;  Location: [place] [facility];  Service: Neurosurgery;  Laterality: Right;   CRANIOTOMY Right 02/03/2016   Procedure: REPAIR OF CEREBROSPINAL FLUID LEAK AND HARVEST ABDOMINAL FAT GRAFT;  Surgeon: [name], MD;  Location: [place] [facility];  Service: Neurosurgery;  Laterality: Right;  ESOPHAGOGASTRODUODENOSCOPY N/A 04/20/2020   Procedure: ESOPHAGOGASTRODUODENOSCOPY (EGD);  Surgeon: Toledo, Benay Pike, MD;  Location: [facility] ENDOSCOPY;  Service: Gastroenterology;  Laterality: N/A;   LUMBAR LAMINECTOMY/DECOMPRESSION MICRODISCECTOMY Left 08/11/2015   Procedure: Left Lumbar four-five microdiscectomy;  Surgeon: [name], MD;  Location: [place] [facility]UR[facility] OR[facility];  Service: Neurosurgery;  Laterality: Left;   MASTECTOMY  05/2009   bilateral, reconstructive surgery 09/2009   MASTECTOMY     Bilateral    repair ectropion extensive Bilateral    skin cancer removal      Family History  Problem Relation  Age of Onset   Mental illness Mother    Diabetes Mother    Hypertension Mother    Hyperlipidemia Mother    Alzheimer's disease Mother    Hypertension Father    Hyperlipidemia Father    Stroke Father    Heart attack Father    Diabetes Father    Deep vein thrombosis Father    Colon cancer Father    Cancer Brother        Lung   Diabetes Brother    Diabetes Brother    Stroke Maternal Grandmother     Social History   Socioeconomic History   Marital status: Widowed    Spouse name: Not on file   Number of children: Not on file   Years of education: Not on file   Highest education level: Not on file  Occupational History   Not on file  Tobacco Use   Smoking status: Former    Packs/day: 1.00    Years: 54.00    Additional pack years: 0.00    Total pack years: 54.00    Types: Cigarettes    Quit date: 03/31/2021    Years since quitting: 1.5   Smokeless tobacco: Never   Tobacco comments:    Refused cessation material  Vaping Use   Vaping Use: Not on file  Substance and Sexual Activity   Alcohol use: No   Drug use: No   Sexual activity: Never  Other Topics Concern   Not on file  Social History Narrative   Lives with mother.   Recently widowed.   Always uses seat belts   Has a bird and a dog.   No exercise.   Social Determinants of Health   Financial Resource Strain: Low Risk  (04/06/2022)   Overall Financial Resource Strain (CARDIA)    Difficulty of Paying Living Expenses: Not hard at all  Food Insecurity: No Food Insecurity (04/06/2022)   Hunger Vital Sign    Worried About Running Out of Food in the Last Year: Never true    Ran Out of Food in the Last Year: Never true  Transportation Needs: No Transportation Needs (04/06/2022)   PRAPARE - Hydrologist (Medical): No    Lack of Transportation (Non-Medical): No  Physical Activity: Insufficiently Active (04/06/2022)   Exercise Vital Sign    Days of Exercise per Week: 1 day    Minutes of  Exercise per Session: 30 min  Stress: No Stress Concern Present (04/06/2022)   [place]    Feeling of Stress : Not at all  Social Connections: Moderately Isolated (04/06/2022)   Social Connection and Isolation Panel [NHANES]    Frequency of Communication with Friends and Family: More than three times a week    Frequency of Social Gatherings with Friends and Family: More than three times a week    Attends Religious Services: 1 to  4 times per year    Active Member of Clubs or Organizations: No    Attends Archivist Meetings: Never    Marital Status: Widowed  Intimate Partner Violence: Not At Risk (04/06/2022)   Humiliation, Afraid, Rape, and Kick questionnaire    Fear of Current or Ex-Partner: No    Emotionally Abused: No    Physically Abused: No    Sexually Abused: No    Outpatient Medications Prior to Visit  Medication Sig Dispense Refill   aspirin EC 81 MG tablet Take 1 tablet (81 mg total) by mouth daily. 90 tablet 1   atorvastatin (LIPITOR) 40 MG tablet Take 1 tablet (40 mg total) by mouth daily. 90 tablet 2   B Complex-C (B-COMPLEX WITH VITAMIN C) tablet Take 1 tablet by mouth daily.     blood glucose meter kit and supplies Dispense based on patient and insurance preference. Use to check sugar once daily as directed. (FOR ICD-10 E10.9, E11.9). 1 each 0   dapagliflozin propanediol (FARXIGA) 10 MG TABS tablet Take 1 tablet (10 mg total) by mouth daily. 90 tablet 3   Lancets (ONETOUCH DELICA PLUS Q000111Q) MISC USE TO CHECK SUGAR ONCE DAILY XX123456 (DELICA PER PT) 123XX123 each 3   levothyroxine (SYNTHROID) 88 MCG tablet Take 1 tablet (88 mcg total) by mouth daily before breakfast. 90 tablet 2   losartan (COZAAR) 100 MG tablet TAKE 1 TABLET BY MOUTH EVERYDAY AT BEDTIME 90 tablet 2   nicotine polacrilex (NICORETTE) 2 MG gum Take 2 mg by mouth as needed for smoking cessation.     nitroGLYCERIN (NITROSTAT) 0.4 MG SL  tablet Place under the tongue.     omeprazole (PRILOSEC) 40 MG capsule Take 1 capsule (40 mg total) by mouth daily. 90 capsule 2   ONETOUCH VERIO test strip USE TO CHECK SUGAR ONCE DAILY E11.9 (VERIO PER PT) 100 strip 3   rOPINIRole (REQUIP) 0.25 MG tablet Take 0.25 mg by mouth at bedtime as needed. (Patient not taking: Reported on 07/06/2022)     traZODone (DESYREL) 100 MG tablet Take 1.5 tablets (150 mg total) by mouth at bedtime. 45 tablet 5   Turmeric 500 MG CAPS Take 2 capsules by mouth daily.     venlafaxine XR (EFFEXOR-XR) 150 MG 24 hr capsule TAKE 1 CAPSULE BY MOUTH EVERY DAY 90 capsule 2   vitamin B-12 (CYANOCOBALAMIN) 1000 MCG tablet Take 1,000 mcg by mouth daily.     Vitamin D, Cholecalciferol, 25 MCG (1000 UT) TABS Take 1 tablet by mouth daily.     No facility-administered medications prior to visit.    Allergies  Allergen Reactions   Lamisil [Terbinafine] Rash   No Known Allergies Rash    ROS See HPI    Objective:    Physical Exam  There were no vitals taken for this visit. Wt Readings from Last 3 Encounters:  07/06/22 158 lb 6.4 oz (71.8 kg)  04/04/22 147 lb (66.7 kg)  10/05/21 158 lb (71.7 kg)   GENERAL: alert, oriented, appears well and in no acute distress   HEENT: atraumatic, conjunttiva red, thick, yellow discharge present, no obvious abnormalities on inspection of external nose and ears   NECK: normal movements of the head and neck   LUNGS: on inspection no signs of respiratory distress, breathing rate appears normal, no obvious gross SOB, gasping or wheezing   CV: no obvious cyanosis   MS: moves all visible extremities without noticeable abnormality   PSYCH/NEURO: pleasant and cooperative, no obvious depression  or anxiety, speech and thought processing grossly intact     Assessment & Plan:   Problem List Items Addressed This Visit       Respiratory   Upper respiratory tract infection - Primary    COVID and Flu testing all negative. Symptoms x  3 days, likely viral, do not feel antibiotics are needed at this time. Encouraged adequate hydration. Advised plain OTC Mucinex and nasal sprays to help with congestion. Encouraged to contact if symptoms are changing or worsening.         Other   Acute bacterial conjunctivitis of both eyes    Will treat with Maxitrol 1-2 drops every 4-6 hours. Encouraged patient to use cool, wet compress as needed and avoid touching eyes, washing hands frequently.       Relevant Medications   neomycin-polymyxin b-dexamethasone (MAXITROL) 3.5-10000-0.1 SUSP   Other Visit Diagnoses     Flu-like symptoms       Encounter for screening for COVID-19           I am having Crystal Meyers start on neomycin-polymyxin b-dexamethasone. I am also having her maintain her nitroGLYCERIN, rOPINIRole, aspirin EC, blood glucose meter kit and supplies, OneTouch Verio, Turmeric, cyanocobalamin, Vitamin D (Cholecalciferol), B-complex with vitamin C, OneTouch Delica Plus 123456, nicotine polacrilex, levothyroxine, traZODone, venlafaxine XR, omeprazole, losartan, atorvastatin, and dapagliflozin propanediol.  Meds ordered this encounter  Medications   neomycin-polymyxin b-dexamethasone (MAXITROL) 3.5-10000-0.1 SUSP    Sig: Instill 1-2 drops in both eyes every 4-6 hours.    Dispense:  5 mL    Refill:  0    Order Specific Question:   Supervising Provider    Answer:   Caryl Bis, ERIC G [[id number]]    I discussed the assessment and treatment plan with the patient. The patient was provided an opportunity to ask questions and all were answered. The patient agreed with the plan and demonstrated an understanding of the instructions.   The patient was advised to call back or seek an in-person evaluation if the symptoms worsen or if the condition fails to improve as anticipated.   Tomasita Morrow, NP [place] at [facility] [phone] (phone) [phone] (fax)  [place]

## 2022-10-11 NOTE — Telephone Encounter (Signed)
Called pt to inform her that [facility] only sent in the eye drops to help with her eyes and that's he can try OTC  Plain Mucinex and nasal spray to help with congestion.

## 2022-10-12 NOTE — Assessment & Plan Note (Signed)
Will treat with Maxitrol 1-2 drops every 4-6 hours. Encouraged patient to use cool, wet compress as needed and avoid touching eyes, washing hands frequently.

## 2022-10-12 NOTE — Assessment & Plan Note (Addendum)
COVID and Flu testing all negative. Symptoms x 3 days, likely viral, do not feel antibiotics are needed at this time. Encouraged adequate hydration. Advised plain OTC Mucinex and nasal sprays to help with congestion. Encouraged to contact if symptoms are changing or worsening.

## 2022-10-28 ENCOUNTER — Other Ambulatory Visit: Payer: Self-pay | Admitting: Internal Medicine

## 2022-11-10 DIAGNOSIS — J449 Chronic obstructive pulmonary disease, unspecified: Secondary | ICD-10-CM | POA: Diagnosis not present

## 2022-11-10 DIAGNOSIS — G2581 Restless legs syndrome: Secondary | ICD-10-CM | POA: Diagnosis not present

## 2022-11-10 DIAGNOSIS — E785 Hyperlipidemia, unspecified: Secondary | ICD-10-CM | POA: Diagnosis not present

## 2022-11-10 DIAGNOSIS — I10 Essential (primary) hypertension: Secondary | ICD-10-CM | POA: Diagnosis not present

## 2022-11-10 DIAGNOSIS — F32A Depression, unspecified: Secondary | ICD-10-CM | POA: Diagnosis not present

## 2022-11-10 DIAGNOSIS — E1122 Type 2 diabetes mellitus with diabetic chronic kidney disease: Secondary | ICD-10-CM | POA: Diagnosis not present

## 2022-11-10 DIAGNOSIS — R8290 Unspecified abnormal findings in urine: Secondary | ICD-10-CM | POA: Diagnosis not present

## 2022-11-10 DIAGNOSIS — G4733 Obstructive sleep apnea (adult) (pediatric): Secondary | ICD-10-CM | POA: Diagnosis not present

## 2022-11-10 DIAGNOSIS — N1831 Chronic kidney disease, stage 3a: Secondary | ICD-10-CM | POA: Diagnosis not present

## 2022-11-16 DIAGNOSIS — N1831 Chronic kidney disease, stage 3a: Secondary | ICD-10-CM | POA: Diagnosis not present

## 2022-11-16 DIAGNOSIS — E1122 Type 2 diabetes mellitus with diabetic chronic kidney disease: Secondary | ICD-10-CM | POA: Diagnosis not present

## 2022-11-16 DIAGNOSIS — I10 Essential (primary) hypertension: Secondary | ICD-10-CM | POA: Diagnosis not present

## 2022-11-16 DIAGNOSIS — D631 Anemia in chronic kidney disease: Secondary | ICD-10-CM | POA: Diagnosis not present

## 2022-12-27 DIAGNOSIS — H52223 Regular astigmatism, bilateral: Secondary | ICD-10-CM | POA: Diagnosis not present

## 2022-12-27 DIAGNOSIS — H524 Presbyopia: Secondary | ICD-10-CM | POA: Diagnosis not present

## 2022-12-27 DIAGNOSIS — H5203 Hypermetropia, bilateral: Secondary | ICD-10-CM | POA: Diagnosis not present

## 2022-12-27 DIAGNOSIS — Z961 Presence of intraocular lens: Secondary | ICD-10-CM | POA: Diagnosis not present

## 2022-12-27 LAB — HM DIABETES EYE EXAM

## 2023-01-06 ENCOUNTER — Ambulatory Visit: Payer: Medicare Other | Admitting: Internal Medicine

## 2023-01-09 ENCOUNTER — Telehealth: Payer: Self-pay

## 2023-01-09 ENCOUNTER — Ambulatory Visit (INDEPENDENT_AMBULATORY_CARE_PROVIDER_SITE_OTHER): Payer: Medicare HMO | Admitting: Internal Medicine

## 2023-01-09 ENCOUNTER — Encounter: Payer: Self-pay | Admitting: Internal Medicine

## 2023-01-09 VITALS — BP 94/60 | HR 77 | Temp 97.8°F | Ht 66.0 in | Wt 155.4 lb

## 2023-01-09 DIAGNOSIS — K760 Fatty (change of) liver, not elsewhere classified: Secondary | ICD-10-CM

## 2023-01-09 DIAGNOSIS — N182 Chronic kidney disease, stage 2 (mild): Secondary | ICD-10-CM | POA: Diagnosis not present

## 2023-01-09 DIAGNOSIS — E1169 Type 2 diabetes mellitus with other specified complication: Secondary | ICD-10-CM | POA: Diagnosis not present

## 2023-01-09 DIAGNOSIS — D509 Iron deficiency anemia, unspecified: Secondary | ICD-10-CM | POA: Diagnosis not present

## 2023-01-09 DIAGNOSIS — Z7984 Long term (current) use of oral hypoglycemic drugs: Secondary | ICD-10-CM

## 2023-01-09 DIAGNOSIS — E1122 Type 2 diabetes mellitus with diabetic chronic kidney disease: Secondary | ICD-10-CM | POA: Diagnosis not present

## 2023-01-09 DIAGNOSIS — E785 Hyperlipidemia, unspecified: Secondary | ICD-10-CM | POA: Diagnosis not present

## 2023-01-09 DIAGNOSIS — I10 Essential (primary) hypertension: Secondary | ICD-10-CM | POA: Diagnosis not present

## 2023-01-09 DIAGNOSIS — L659 Nonscarring hair loss, unspecified: Secondary | ICD-10-CM

## 2023-01-09 DIAGNOSIS — E039 Hypothyroidism, unspecified: Secondary | ICD-10-CM

## 2023-01-09 DIAGNOSIS — J431 Panlobular emphysema: Secondary | ICD-10-CM

## 2023-01-09 DIAGNOSIS — K22719 Barrett's esophagus with dysplasia, unspecified: Secondary | ICD-10-CM

## 2023-01-09 DIAGNOSIS — I700 Atherosclerosis of aorta: Secondary | ICD-10-CM

## 2023-01-09 DIAGNOSIS — G500 Trigeminal neuralgia: Secondary | ICD-10-CM | POA: Diagnosis not present

## 2023-01-09 DIAGNOSIS — Z9013 Acquired absence of bilateral breasts and nipples: Secondary | ICD-10-CM

## 2023-01-09 LAB — LIPID PANEL
Cholesterol: 148 mg/dL (ref 0–200)
HDL: 63.6 mg/dL (ref >39.00)
LDL Cholesterol: 57 mg/dL (ref 0–99)
NonHDL: 84.19
Total CHOL/HDL Ratio: 2
Triglycerides: 134 mg/dL (ref 0.0–149.0)
VLDL: 26.8 mg/dL (ref 0.0–40.0)

## 2023-01-09 LAB — IBC + FERRITIN
Ferritin: 4.7 ng/mL — ABNORMAL LOW (ref 10.0–291.0)
Iron: 71 ug/dL (ref 42–145)
Saturation Ratios: 17.6 % — ABNORMAL LOW (ref 20.0–50.0)
TIBC: 403.2 ug/dL (ref 250.0–450.0)
Transferrin: 288 mg/dL (ref 212.0–360.0)

## 2023-01-09 LAB — HEMOGLOBIN A1C: Hgb A1c MFr Bld: 6.1 % (ref 4.6–6.5)

## 2023-01-09 LAB — COMPREHENSIVE METABOLIC PANEL
ALT: 19 U/L (ref 0–35)
AST: 21 U/L (ref 0–37)
Albumin: 4.1 g/dL (ref 3.5–5.2)
Alkaline Phosphatase: 41 U/L (ref 39–117)
BUN: 28 mg/dL — ABNORMAL HIGH (ref 6–23)
CO2: 31 mEq/L (ref 19–32)
Calcium: 10 mg/dL (ref 8.4–10.5)
Chloride: 100 mEq/L (ref 96–112)
Creatinine, Ser: 1.14 mg/dL (ref 0.40–1.20)
GFR: 47.4 mL/min — ABNORMAL LOW (ref >60.00)
Glucose, Bld: 97 mg/dL (ref 70–99)
Potassium: 4.3 mEq/L (ref 3.5–5.1)
Sodium: 138 mEq/L (ref 135–145)
Total Bilirubin: 0.3 mg/dL (ref 0.2–1.2)
Total Protein: 7.3 g/dL (ref 6.0–8.3)

## 2023-01-09 LAB — LDL CHOLESTEROL, DIRECT: Direct LDL: 58 mg/dL

## 2023-01-09 LAB — TSH: TSH: 1.54 u[IU]/mL (ref 0.35–5.50)

## 2023-01-09 LAB — B12 AND FOLATE PANEL
Folate: >23.8 ng/mL (ref >5.9)
Vitamin B-12: >1500 pg/mL — ABNORMAL HIGH (ref 211–911)

## 2023-01-09 MED ORDER — ONETOUCH VERIO VI STRP: ORAL_STRIP | 3 refills | Status: DC

## 2023-01-09 MED ORDER — LANCET DEVICE MISC: 1.0000 | Freq: Three times a day (TID) | 0 refills | Status: AC

## 2023-01-09 MED ORDER — TRAZODONE HCL 100 MG PO TABS: 150.0000 mg | ORAL_TABLET | Freq: Every day | ORAL | 5 refills | Status: DC

## 2023-01-09 MED ORDER — ONETOUCH DELICA PLUS LANCET30G MISC: 3 refills | Status: DC

## 2023-01-09 MED ORDER — LANCETS MISC. MISC: 1.0000 | Freq: Three times a day (TID) | 0 refills | Status: AC

## 2023-01-09 MED ORDER — LOSARTAN POTASSIUM 50 MG PO TABS: 50.0000 mg | ORAL_TABLET | Freq: Every day | ORAL | 1 refills | Status: DC

## 2023-01-09 MED ORDER — BLOOD GLUCOSE MONITORING SUPPL DEVI: 1.0000 | Freq: Three times a day (TID) | 0 refills | Status: DC

## 2023-01-09 MED ORDER — BLOOD GLUCOSE TEST VI STRP: 1.0000 | ORAL_STRIP | Freq: Three times a day (TID) | 0 refills | Status: AC

## 2023-01-09 MED ORDER — ROPINIROLE HCL 0.25 MG PO TABS: 0.2500 mg | ORAL_TABLET | Freq: Every evening | ORAL | 1 refills | Status: DC | PRN

## 2023-01-09 NOTE — Patient Instructions (Addendum)
Cut out sodas,  Junk food:    eat a mediterranean style diet .  REDUCE THE LOSARTAN TO 50 MG DAILY AND SEND ME BP READINGS after one week     Please have your dermatologist look at the 2 lesions on the top of your scalp   If your bp can tolerate it I will prescribe minoxidil

## 2023-01-09 NOTE — Assessment & Plan Note (Signed)
Currently well-controlled on  Farxiga  .  hemoglobin A1c has been at goal of less than 7.0 . Patient  as annual eye exam at [facility]. and foot exam is normal today. Patient has no microalbuminuria and BP is  over controlled on 100 mg losartan  Patient is tolerating statin therapy for CAD risk reduction and on ACE/ARB for renal protection and hypertension   Lab Results  Component Value Date   HGBA1C 6.0 07/06/2022

## 2023-01-09 NOTE — Progress Notes (Unsigned)
Subjective:  Patient ID: Crystal Meyers, female    DOB: 12/01/1945  Age: 77 y.o. MRN: [id number]  CC: The primary encounter diagnosis was Essential hypertension. Diagnoses of Acquired hypothyroidism, Hyperlipidemia associated with type 2 diabetes mellitus (HCC), Type 2 diabetes mellitus with stage 2 chronic kidney disease, without long-term current use of insulin (HCC), Hair loss, S/P bilateral mastectomy, Trigeminal neuralgia of right side of face, and Thinning hair were also pertinent to this visit.   HPI Crystal Meyers presents for  Chief Complaint  Patient presents with  . Medical Management of Chronic Issues   1) thinning hair, first noticed several months ago ,  part has become wider ,  nails have also become brittle .  Has changed her historical diet due to nephrology recommendations and having a difficult time finding enough to eat.   Has stopped taking biotin for the past 3 weeks  because she thought it affected her heart    2) type 2 DM with CKD :  tolerating [place].    2)HTN:  home readings have ben < 100 .  OCCASIONALLY FEELS ORTHOSTATIC  3) TRIGEMINAL NEURALGIA:   still occurring intermittently despite trigmenial nerve copression I n 2017 which was complicated  by CSF leak . No longer requring  medication      Outpatient Medications Prior to Visit  Medication Sig Dispense Refill  . aspirin EC 81 MG tablet Take 1 tablet (81 mg total) by mouth daily. 90 tablet 1  . atorvastatin (LIPITOR) 40 MG tablet Take 1 tablet (40 mg total) by mouth daily. 90 tablet 2  . B Complex-C (B-COMPLEX WITH VITAMIN C) tablet Take 1 tablet by mouth daily.    . blood glucose meter kit and supplies Dispense based on patient and insurance preference. Use to check sugar once daily as directed. (FOR ICD-10 E10.9, E11.9). 1 each 0  . dapagliflozin propanediol (FARXIGA) 10 MG TABS tablet Take 1 tablet (10 mg total) by mouth daily. 90 tablet 3  . levothyroxine (SYNTHROID) 88 MCG tablet TAKE 1 TABLET  BY MOUTH DAILY BEFORE BREAKFAST. 90 tablet 3  . nicotine polacrilex (NICORETTE) 2 MG gum Take 2 mg by mouth as needed for smoking cessation.    . nitroGLYCERIN (NITROSTAT) 0.4 MG SL tablet Place under the tongue.    [name] omeprazole (PRILOSEC) 40 MG capsule Take 1 capsule (40 mg total) by mouth daily. 90 capsule 2  . Turmeric 500 MG CAPS Take 2 capsules by mouth daily.    [name] venlafaxine XR (EFFEXOR-XR) 150 MG 24 hr capsule TAKE 1 CAPSULE BY MOUTH EVERY DAY 90 capsule 2  . vitamin B-12 (CYANOCOBALAMIN) 1000 MCG tablet Take 1,000 mcg by mouth daily.    . Vitamin D, Cholecalciferol, 25 MCG (1000 UT) TABS Take 1 tablet by mouth daily.    . Lancets (ONETOUCH DELICA PLUS LANCET30G) MISC USE TO CHECK SUGAR ONCE DAILY E11.9 (DELICA PER PT) 100 each 3  . losartan (COZAAR) 100 MG tablet TAKE 1 TABLET BY MOUTH EVERYDAY AT BEDTIME 90 tablet 2  . ONETOUCH VERIO test strip USE TO CHECK SUGAR ONCE DAILY E11.9 (VERIO PER PT) 100 strip 3  . rOPINIRole (REQUIP) 0.25 MG tablet Take 0.25 mg by mouth at bedtime as needed.    . traZODone (DESYREL) 100 MG tablet Take 1.5 tablets (150 mg total) by mouth at bedtime. 45 tablet 5  . neomycin-polymyxin b-dexamethasone (MAXITROL) 3.5-10000-0.1 SUSP Instill 1-2 drops in both eyes every 4-6 hours. (Patient not taking: Reported on 01/09/2023)  5 mL 0   No facility-administered medications prior to visit.    Review of Systems;  Patient denies headache, fevers, malaise, unintentional weight loss, skin rash, eye pain, sinus congestion and sinus pain, sore throat, dysphagia,  hemoptysis , cough, dyspnea, wheezing, chest pain, palpitations, orthopnea, edema, abdominal pain, nausea, melena, diarrhea, constipation, flank pain, dysuria, hematuria, urinary  Frequency, nocturia, numbness, tingling, seizures,  Focal weakness, Loss of consciousness,  Tremor, insomnia, depression, anxiety, and suicidal ideation.      Objective:  BP 94/60   Pulse 77   Temp 97.8 F (36.6 C) (Oral)   Ht 5\' 6"   (1.676 m)   Wt 155 lb 6.4 oz (70.5 kg)   SpO2 98%   BMI 25.08 kg/m   BP Readings from Last 3 Encounters:  01/09/23 94/60  07/06/22 100/66  04/04/22 110/66    Wt Readings from Last 3 Encounters:  01/09/23 155 lb 6.4 oz (70.5 kg)  07/06/22 158 lb 6.4 oz (71.8 kg)  04/04/22 147 lb (66.7 kg)    Physical Exam Vitals reviewed.  Constitutional:      General: She is not in acute distress.    Appearance: Normal appearance. She is normal weight. She is not ill-appearing, toxic-appearing or diaphoretic.  HENT:     Head: Normocephalic. Hair is abnormal.     Comments: Thinning of hair over crown  Eyes:     General: No scleral icterus.       Right eye: No discharge.        Left eye: No discharge.     Conjunctiva/sclera: Conjunctivae normal.  Cardiovascular:     Rate and Rhythm: Normal rate and regular rhythm.     Heart sounds: Normal heart sounds.  Pulmonary:     Effort: Pulmonary effort is normal. No respiratory distress.     Breath sounds: Rhonchi present.  Musculoskeletal:        General: Normal range of motion.  Skin:    General: Skin is warm and dry.  Neurological:     General: No focal deficit present.     Mental Status: She is alert and oriented to person, place, and time. Mental status is at baseline.  Psychiatric:        Mood and Affect: Mood normal.        Behavior: Behavior normal.        Thought Content: Thought content normal.        Judgment: Judgment normal.   Lab Results  Component Value Date   HGBA1C 6.0 07/06/2022   HGBA1C 5.7 04/04/2022   HGBA1C 6.0 09/30/2021    Lab Results  Component Value Date   CREATININE 1.22 (H) 07/06/2022   CREATININE 1.09 04/04/2022   CREATININE 1.08 09/30/2021    Lab Results  Component Value Date   WBC 4.8 06/22/2021   HGB 13.1 06/22/2021   HCT 39.4 06/22/2021   PLT 246 06/22/2021   GLUCOSE 94 07/06/2022   CHOL 140 07/06/2022   TRIG 126.0 07/06/2022   HDL 58.30 07/06/2022   LDLDIRECT 52.0 07/06/2022   LDLCALC 57  07/06/2022   ALT 21 07/06/2022   AST 20 07/06/2022   NA 139 07/06/2022   K 4.0 07/06/2022   CL 100 07/06/2022   CREATININE 1.22 (H) 07/06/2022   BUN 21 07/06/2022   CO2 33 (H) 07/06/2022   TSH 2.89 04/04/2022   HGBA1C 6.0 07/06/2022   MICROALBUR <0.7 07/06/2022    CT CHEST LUNG CA SCREEN LOW DOSE W/O CM  Result Date:  10/09/2022 CLINICAL DATA:  77 year old female with 74 pack-year history of smoking. Lung cancer screening. EXAM: CT CHEST WITHOUT CONTRAST LOW-DOSE FOR LUNG CANCER SCREENING TECHNIQUE: Multidetector CT imaging of the chest was performed following the standard protocol without IV contrast. RADIATION DOSE REDUCTION: This exam was performed according to the departmental dose-optimization program which includes automated exposure control, adjustment of the mA and/or kV according to patient size and/or use of iterative reconstruction technique. COMPARISON:  10/05/2021 FINDINGS: Cardiovascular: The heart size is normal. No substantial pericardial effusion. Coronary artery calcification is evident. Mild atherosclerotic calcification is noted in the wall of the thoracic aorta. Mediastinum/Nodes: No mediastinal lymphadenopathy. No evidence for gross hilar lymphadenopathy although assessment is limited by the lack of intravenous contrast on the current study. The esophagus has normal imaging features. There is no axillary lymphadenopathy. Lungs/Pleura: Centrilobular and paraseptal emphysema evident. Biapical pleuroparenchymal scarring evident. Scattered tiny bilateral pulmonary nodules identified previously are stable in the interval. No new suspicious pulmonary nodule or mass. No focal airspace consolidation. No pleural effusion. Upper Abdomen: Tiny hypodensity in the lateral segment left liver is stable. Too small to characterize this is statistically most likely benign. No followup imaging is recommended. Musculoskeletal: No worrisome lytic or sclerotic osseous abnormality. IMPRESSION: 1.  Lung-RADS 2, benign appearance or behavior. Continue annual screening with low-dose chest CT without contrast in 12 months. 2. Aortic Atherosclerosis (ICD10-I70.0) and Emphysema (ICD10-J43.9). Electronically Signed   By: Kennith Center M.D.   On: 10/09/2022 08:18    Assessment & Plan:  .Essential hypertension -     Comprehensive metabolic panel -     Losartan Potassium; Take 1 tablet (50 mg total) by mouth daily. TAKE 1 TABLET BY MOUTH EVERYDAY AT BEDTIME  Dispense: 90 tablet; Refill: 1  Acquired hypothyroidism -     TSH  Hyperlipidemia associated with type 2 diabetes mellitus (HCC) -     Lipid panel -     LDL cholesterol, direct  Type 2 diabetes mellitus with stage 2 chronic kidney disease, without long-term current use of insulin (HCC) Assessment & Plan: Currently well-controlled on  Farxiga  .  hemoglobin A1c has been at goal of less than 7.0 . Patient  as annual [facility] exam at [facility]. and foot exam is normal today. Patient has no microalbuminuria and BP is  over controlled on 100 mg losartan  Patient is tolerating statin therapy for CAD risk reduction and on ACE/ARB for renal protection and hypertension   Lab Results  Component Value Date   HGBA1C 6.0 07/06/2022     Orders: -     Hemoglobin A1c -     Comprehensive metabolic panel  Hair loss -     IBC + Ferritin -     B12 and Folate Panel -     Testosterone,Free and Total; Future  S/P bilateral mastectomy  Trigeminal neuralgia of right side of face Assessment & Plan: Symptoms are recurrent and triggered by touchign face but are too mild to treat per patient prefernece    Thinning hair Assessment & Plan: Thinning is noticed along the crown and vertex,  part Is widened .  No receding hairline.  Checking TSH, B12 iron.  Will ;consider minoxidil if hypotension is resolved with lower dose losartan.  Has derm appt in August    Other orders -     traZODone HCl; Take 1.5 tablets (150 mg total) by mouth at bedtime.   Dispense: 45 tablet; Refill: 5 -     OneTouch Delica Plus  Lancet30G; USE TO CHECK SUGAR ONCE DAILY E11.9 (DELICA PER PT)  Dispense: 100 each; Refill: 3 -     OneTouch Verio; USE TO CHECK SUGAR ONCE DAILY E11.9 (VERIO PER PT)  Dispense: 100 strip; Refill: 3 -     rOPINIRole HCl; Take 1 tablet (0.25 mg total) by mouth at bedtime as needed.  Dispense: 90 tablet; Refill: 1 -     Blood Glucose Monitoring Suppl; 1 each by Does not apply route in the morning, at noon, and at bedtime. May substitute to any manufacturer covered by patient's insurance.  Dispense: 1 each; Refill: 0 -     Blood Glucose Test; 1 each by In Vitro route in the morning, at noon, and at bedtime. May substitute to any manufacturer covered by patient's insurance.  Dispense: 100 strip; Refill: 0 -     Lancet Device; 1 each by Does not apply route in the morning, at noon, and at bedtime. May substitute to any manufacturer covered by patient's insurance.  Dispense: 1 each; Refill: 0 -     Lancets Misc.; 1 each by Does not apply route in the morning, at noon, and at bedtime. May substitute to any manufacturer covered by patient's insurance.  Dispense: 100 each; Refill: 0     I provided 30 minutes of face-to-face time during this encounter reviewing patient's last visit with me, patient's  most recent visit with cardiology,  nephrology,  and neurology,  recent surgical and non surgical procedures, previous  labs and imaging studies, counseling on currently addressed issues,  and post visit ordering to diagnostics and therapeutics .   Follow-up: Return in about 6 months (around 07/12/2023).   Sherlene Shams, MD

## 2023-01-09 NOTE — Telephone Encounter (Signed)
If okay I will send it in to the pharmacy.

## 2023-01-09 NOTE — Assessment & Plan Note (Signed)
Thinning is noticed along the crown and vertex,  part Is widened .  No receding hairline.  Normal TSH, B12 ,  low iron.  Will ;consider minoxidil if hypotension is resolved with lower dose losartan.  Has derm appt in August   Lab Results  Component Value Date   VITAMINB12 >1500 (H) 01/09/2023     Lab Results  Component Value Date   TSH 1.54 01/09/2023   Lab Results  Component Value Date   IRON 71 01/09/2023   TIBC 403.2 01/09/2023   FERRITIN 4.7 (L) 01/09/2023

## 2023-01-09 NOTE — Assessment & Plan Note (Signed)
Symptoms are recurrent and triggered by touchign face but are too mild to treat per patient prefernece

## 2023-01-09 NOTE — Telephone Encounter (Signed)
Patient states at check-out that she forgot to tell Dr. Duncan Dull that Dr. Thedore Mins would like for Dr. Darrick Huntsman to order a continuous blood glucose monitor for patient for about three months.

## 2023-01-10 DIAGNOSIS — D509 Iron deficiency anemia, unspecified: Secondary | ICD-10-CM | POA: Insufficient documentation

## 2023-01-10 MED ORDER — FREESTYLE LIBRE 2 SENSOR MISC: 2 refills | Status: DC

## 2023-01-10 MED ORDER — FREESTYLE LIBRE 2 READER DEVI: 0 refills | Status: DC

## 2023-01-10 NOTE — Telephone Encounter (Signed)
Libre 2 sensor and reader has been sent to pharmacy.

## 2023-01-10 NOTE — Assessment & Plan Note (Signed)
She is reminded to continue a daily dose of omeprazole.  Last EGD was done in 2016; no repeat needed per [place]  

## 2023-01-10 NOTE — Assessment & Plan Note (Signed)
Noted by Dr Mechele Collin years ago  Noted on recent renal US .  LFTs have been normal and she is tolerating statin therapy   Lab Results  Component Value Date   ALT 19 01/09/2023   AST 21 01/09/2023   ALKPHOS 41 01/09/2023   BILITOT 0.3 01/09/2023

## 2023-01-10 NOTE — Assessment & Plan Note (Signed)
Readings are low and she reports recurrent feelinsg of orthostasis.  Reduce losartan to 50 mg daily and reassess with home readings

## 2023-01-10 NOTE — Assessment & Plan Note (Signed)
Reviewed findings of prior CT scan today..  Patient is tolerating high potency statin therapy  

## 2023-01-10 NOTE — Assessment & Plan Note (Signed)
Asymptomatic but lives a sedentary lifestyle. She has quit smoking and receives annual lung CA screening

## 2023-01-10 NOTE — Assessment & Plan Note (Signed)
Will recommend iron supplementation and chec FOBT

## 2023-01-10 NOTE — Assessment & Plan Note (Addendum)
Excellent  Control of cholesterol with atorvastatin  ,  Excellent  glycemic control on Farxiga alone ( metformin XR stopped due to diarrhea )/  Continue Statin and aspirin.  Reducing  losartan due to hypotension.   Lab Results  Component Value Date   HGBA1C 6.1 01/09/2023   Lab Results  Component Value Date   CHOL 148 01/09/2023   HDL 63.60 01/09/2023   LDLCALC 57 01/09/2023   LDLDIRECT 58.0 01/09/2023   TRIG 134.0 01/09/2023   CHOLHDL 2 01/09/2023

## 2023-01-19 ENCOUNTER — Telehealth: Payer: Self-pay | Admitting: *Deleted

## 2023-01-19 ENCOUNTER — Other Ambulatory Visit (INDEPENDENT_AMBULATORY_CARE_PROVIDER_SITE_OTHER): Payer: Medicare HMO

## 2023-01-19 DIAGNOSIS — D509 Iron deficiency anemia, unspecified: Secondary | ICD-10-CM | POA: Diagnosis not present

## 2023-01-19 DIAGNOSIS — R195 Other fecal abnormalities: Secondary | ICD-10-CM

## 2023-01-19 LAB — FECAL OCCULT BLOOD, IMMUNOCHEMICAL: Fecal Occult Bld: POSITIVE — AB

## 2023-01-19 NOTE — Telephone Encounter (Signed)
Spoke to Patient and she states she would like to see Dr. Bing Plume at [facility] due to that is who does her colonoscopy.

## 2023-01-19 NOTE — Telephone Encounter (Signed)
Lorie called  from [employer] with Positive E-FOB  for  patient.

## 2023-01-19 NOTE — Telephone Encounter (Signed)
Your  fecal occult blood test was abnormal and detected blood in your stool.   This was done to investigate why you are anemic.  I am recommending that you see a gastroenterologist and will refer you  if you will agree to go..   Do you have a preference ?

## 2023-01-20 NOTE — Telephone Encounter (Signed)
Spoke to Patient to let her know that Dr. Darrick Huntsman called the referral in to Dr. Norma Fredrickson.

## 2023-01-26 ENCOUNTER — Other Ambulatory Visit (HOSPITAL_COMMUNITY): Payer: Self-pay

## 2023-01-27 ENCOUNTER — Telehealth: Payer: Self-pay

## 2023-01-27 MED ORDER — FREESTYLE LIBRE 2 SENSOR MISC: 2 refills | Status: DC

## 2023-01-27 NOTE — Telephone Encounter (Signed)
Pharmacy Patient Advocate Encounter  Received notification from [facility] that Prior Authorization for [facility] 2 has been APPROVED from 07/11/22 to 07/11/23.[name]  PA #/Case ID/Reference #: [id number]      Full approval letters attached to charts

## 2023-01-27 NOTE — Addendum Note (Signed)
Addended by: Sandy Salaam on: 01/27/2023 01:46 PM   Modules accepted: Orders

## 2023-02-13 ENCOUNTER — Other Ambulatory Visit (INDEPENDENT_AMBULATORY_CARE_PROVIDER_SITE_OTHER): Payer: Medicare HMO

## 2023-02-13 DIAGNOSIS — L659 Nonscarring hair loss, unspecified: Secondary | ICD-10-CM | POA: Diagnosis not present

## 2023-02-16 DIAGNOSIS — G8929 Other chronic pain: Secondary | ICD-10-CM | POA: Diagnosis not present

## 2023-02-16 DIAGNOSIS — D500 Iron deficiency anemia secondary to blood loss (chronic): Secondary | ICD-10-CM | POA: Diagnosis not present

## 2023-02-16 DIAGNOSIS — R1032 Left lower quadrant pain: Secondary | ICD-10-CM | POA: Diagnosis not present

## 2023-02-16 DIAGNOSIS — Z86010 Personal history of colonic polyps: Secondary | ICD-10-CM | POA: Diagnosis not present

## 2023-02-16 DIAGNOSIS — K219 Gastro-esophageal reflux disease without esophagitis: Secondary | ICD-10-CM | POA: Diagnosis not present

## 2023-02-16 DIAGNOSIS — K921 Melena: Secondary | ICD-10-CM | POA: Diagnosis not present

## 2023-03-09 DIAGNOSIS — L820 Inflamed seborrheic keratosis: Secondary | ICD-10-CM | POA: Diagnosis not present

## 2023-03-09 DIAGNOSIS — D225 Melanocytic nevi of trunk: Secondary | ICD-10-CM | POA: Diagnosis not present

## 2023-03-09 DIAGNOSIS — Z08 Encounter for follow-up examination after completed treatment for malignant neoplasm: Secondary | ICD-10-CM | POA: Diagnosis not present

## 2023-03-09 DIAGNOSIS — L821 Other seborrheic keratosis: Secondary | ICD-10-CM | POA: Diagnosis not present

## 2023-03-09 DIAGNOSIS — R208 Other disturbances of skin sensation: Secondary | ICD-10-CM | POA: Diagnosis not present

## 2023-03-09 DIAGNOSIS — Z85828 Personal history of other malignant neoplasm of skin: Secondary | ICD-10-CM | POA: Diagnosis not present

## 2023-03-17 MED ORDER — DAPAGLIFLOZIN PROPANEDIOL 10 MG PO TABS: 10.0000 mg | ORAL_TABLET | Freq: Every day | ORAL | 3 refills | Status: DC

## 2023-03-17 NOTE — Addendum Note (Signed)
Addended by: Sandy Salaam on: 03/17/2023 02:29 PM   Modules accepted: Orders

## 2023-03-17 NOTE — Telephone Encounter (Signed)
Received following message from [facility], please send new rx, will upload this letter and directions for e-scribing to media tab.

## 2023-03-17 NOTE — Telephone Encounter (Signed)
Med refilled.

## 2023-03-28 ENCOUNTER — Encounter: Payer: Self-pay | Admitting: Internal Medicine

## 2023-03-28 ENCOUNTER — Other Ambulatory Visit: Payer: Self-pay

## 2023-04-01 ENCOUNTER — Other Ambulatory Visit: Payer: Self-pay | Admitting: Internal Medicine

## 2023-04-03 ENCOUNTER — Encounter: Payer: Self-pay | Admitting: Internal Medicine

## 2023-04-04 ENCOUNTER — Encounter: Payer: Self-pay | Admitting: Internal Medicine

## 2023-04-04 ENCOUNTER — Other Ambulatory Visit: Payer: Self-pay

## 2023-04-04 ENCOUNTER — Ambulatory Visit: Payer: Medicare HMO | Admitting: Certified Registered"

## 2023-04-04 ENCOUNTER — Ambulatory Visit
Admission: RE | Admit: 2023-04-04 | Discharge: 2023-04-04 | Disposition: A | Discharge status: Home / Self Care | Payer: Medicare HMO | Attending: Internal Medicine | Admitting: Internal Medicine

## 2023-04-04 ENCOUNTER — Encounter
Admission: RE | Disposition: A | Discharge status: Home / Self Care | Payer: Self-pay | Source: Home / Self Care | Attending: Internal Medicine

## 2023-04-04 DIAGNOSIS — K219 Gastro-esophageal reflux disease without esophagitis: Secondary | ICD-10-CM | POA: Diagnosis not present

## 2023-04-04 DIAGNOSIS — E7800 Pure hypercholesterolemia, unspecified: Secondary | ICD-10-CM | POA: Diagnosis not present

## 2023-04-04 DIAGNOSIS — F32A Depression, unspecified: Secondary | ICD-10-CM | POA: Insufficient documentation

## 2023-04-04 DIAGNOSIS — K648 Other hemorrhoids: Secondary | ICD-10-CM | POA: Diagnosis not present

## 2023-04-04 DIAGNOSIS — K2970 Gastritis, unspecified, without bleeding: Secondary | ICD-10-CM | POA: Insufficient documentation

## 2023-04-04 DIAGNOSIS — N189 Chronic kidney disease, unspecified: Secondary | ICD-10-CM | POA: Diagnosis not present

## 2023-04-04 DIAGNOSIS — Z87891 Personal history of nicotine dependence: Secondary | ICD-10-CM | POA: Insufficient documentation

## 2023-04-04 DIAGNOSIS — Z853 Personal history of malignant neoplasm of breast: Secondary | ICD-10-CM | POA: Insufficient documentation

## 2023-04-04 DIAGNOSIS — Z7984 Long term (current) use of oral hypoglycemic drugs: Secondary | ICD-10-CM | POA: Insufficient documentation

## 2023-04-04 DIAGNOSIS — D123 Benign neoplasm of transverse colon: Secondary | ICD-10-CM | POA: Diagnosis not present

## 2023-04-04 DIAGNOSIS — Z79899 Other long term (current) drug therapy: Secondary | ICD-10-CM | POA: Insufficient documentation

## 2023-04-04 DIAGNOSIS — J4489 Other specified chronic obstructive pulmonary disease: Secondary | ICD-10-CM | POA: Diagnosis not present

## 2023-04-04 DIAGNOSIS — I129 Hypertensive chronic kidney disease with stage 1 through stage 4 chronic kidney disease, or unspecified chronic kidney disease: Secondary | ICD-10-CM | POA: Diagnosis not present

## 2023-04-04 DIAGNOSIS — E119 Type 2 diabetes mellitus without complications: Secondary | ICD-10-CM | POA: Diagnosis not present

## 2023-04-04 DIAGNOSIS — K635 Polyp of colon: Secondary | ICD-10-CM | POA: Diagnosis not present

## 2023-04-04 DIAGNOSIS — D509 Iron deficiency anemia, unspecified: Secondary | ICD-10-CM | POA: Insufficient documentation

## 2023-04-04 DIAGNOSIS — K2289 Other specified disease of esophagus: Secondary | ICD-10-CM | POA: Diagnosis not present

## 2023-04-04 DIAGNOSIS — K640 First degree hemorrhoids: Secondary | ICD-10-CM | POA: Insufficient documentation

## 2023-04-04 DIAGNOSIS — I2510 Atherosclerotic heart disease of native coronary artery without angina pectoris: Secondary | ICD-10-CM | POA: Diagnosis not present

## 2023-04-04 DIAGNOSIS — G4730 Sleep apnea, unspecified: Secondary | ICD-10-CM | POA: Insufficient documentation

## 2023-04-04 DIAGNOSIS — E039 Hypothyroidism, unspecified: Secondary | ICD-10-CM | POA: Diagnosis not present

## 2023-04-04 DIAGNOSIS — Z85828 Personal history of other malignant neoplasm of skin: Secondary | ICD-10-CM | POA: Insufficient documentation

## 2023-04-04 DIAGNOSIS — E1122 Type 2 diabetes mellitus with diabetic chronic kidney disease: Secondary | ICD-10-CM | POA: Diagnosis not present

## 2023-04-04 DIAGNOSIS — Z7982 Long term (current) use of aspirin: Secondary | ICD-10-CM | POA: Diagnosis not present

## 2023-04-04 DIAGNOSIS — K3189 Other diseases of stomach and duodenum: Secondary | ICD-10-CM | POA: Insufficient documentation

## 2023-04-04 DIAGNOSIS — F419 Anxiety disorder, unspecified: Secondary | ICD-10-CM | POA: Insufficient documentation

## 2023-04-04 DIAGNOSIS — D631 Anemia in chronic kidney disease: Secondary | ICD-10-CM | POA: Diagnosis not present

## 2023-04-04 DIAGNOSIS — K921 Melena: Secondary | ICD-10-CM | POA: Insufficient documentation

## 2023-04-04 DIAGNOSIS — Z86010 Personal history of colonic polyps: Secondary | ICD-10-CM | POA: Diagnosis not present

## 2023-04-04 DIAGNOSIS — Z09 Encounter for follow-up examination after completed treatment for conditions other than malignant neoplasm: Secondary | ICD-10-CM | POA: Insufficient documentation

## 2023-04-04 DIAGNOSIS — I10 Essential (primary) hypertension: Secondary | ICD-10-CM | POA: Diagnosis not present

## 2023-04-04 HISTORY — PX: BIOPSY: SHX5522

## 2023-04-04 HISTORY — PX: POLYPECTOMY: SHX5525

## 2023-04-04 HISTORY — PX: COLONOSCOPY WITH PROPOFOL: SHX5780

## 2023-04-04 HISTORY — DX: Iron deficiency anemia, unspecified: D50.9

## 2023-04-04 HISTORY — PX: ESOPHAGOGASTRODUODENOSCOPY (EGD) WITH PROPOFOL: SHX5813

## 2023-04-04 LAB — GLUCOSE, CAPILLARY: Glucose-Capillary: 91 mg/dL (ref 70–99)

## 2023-04-04 SURGERY — COLONOSCOPY WITH PROPOFOL
Anesthesia: General

## 2023-04-04 MED ORDER — GLYCOPYRROLATE 0.2 MG/ML IJ SOLN
INTRAMUSCULAR | Status: DC | PRN
  Administered 2023-04-04: .2 mg via INTRAVENOUS

## 2023-04-04 MED ORDER — PROPOFOL 500 MG/50ML IV EMUL
INTRAVENOUS | Status: DC | PRN
  Administered 2023-04-04: 120 ug/kg/min via INTRAVENOUS

## 2023-04-04 MED ORDER — GLYCOPYRROLATE 0.2 MG/ML IJ SOLN
INTRAMUSCULAR | Status: AC
  Filled 2023-04-04: qty 1

## 2023-04-04 MED ORDER — PROPOFOL 1000 MG/100ML IV EMUL
INTRAVENOUS | Status: AC
  Filled 2023-04-04: qty 100

## 2023-04-04 MED ORDER — LIDOCAINE HCL (PF) 2 % IJ SOLN
INTRAMUSCULAR | Status: AC
  Filled 2023-04-04: qty 5

## 2023-04-04 MED ORDER — LIDOCAINE 2% (20 MG/ML) 5 ML SYRINGE
INTRAMUSCULAR | Status: DC | PRN
  Administered 2023-04-04: 20 mg via INTRAVENOUS

## 2023-04-04 MED ORDER — PROPOFOL 10 MG/ML IV BOLUS
INTRAVENOUS | Status: DC | PRN
  Administered 2023-04-04: 70 mg via INTRAVENOUS
  Administered 2023-04-04: 30 mg via INTRAVENOUS

## 2023-04-04 MED ORDER — SODIUM CHLORIDE 0.9 % IV SOLN: INTRAVENOUS | Status: DC

## 2023-04-04 NOTE — Anesthesia Preprocedure Evaluation (Signed)
Anesthesia Evaluation  Patient identified by MRN, date of birth, ID band Patient awake    Reviewed: Allergy & Precautions, H&P , NPO status , Patient's Chart, lab work & pertinent test results, reviewed documented beta blocker date and time   History of Anesthesia Complications (+) history of anesthetic complications  Airway Mallampati: II   Neck ROM: full    Dental  (+) Poor Dentition, Dental Advidsory Given   Pulmonary neg shortness of breath, asthma , sleep apnea and Continuous Positive Airway Pressure Ventilation , pneumonia, resolved, COPD,  COPD inhaler, neg recent URI, Patient abstained from smoking., former smoker   Pulmonary exam normal        Cardiovascular Exercise Tolerance: Poor hypertension, On Medications (-) angina + CAD and + Peripheral Vascular Disease  (-) Past MI and (-) Cardiac Stents Normal cardiovascular exam(-) dysrhythmias (-) Valvular Problems/Murmurs Rhythm:regular Rate:Normal     Neuro/Psych neg Seizures PSYCHIATRIC DISORDERS Anxiety Depression     Neuromuscular disease    GI/Hepatic Neg liver ROS,GERD  Medicated,,  Endo/Other  diabetes, Well Controlled, Type 2, Oral Hypoglycemic AgentsHypothyroidism    Renal/GU Renal disease  negative genitourinary   Musculoskeletal   Abdominal   Peds  Hematology negative hematology ROS (+)   Anesthesia Other Findings Past Medical History: No date: Anxiety No date: Asthma No date: Barrett's esophagus 7/10: Breast cancer (HCC)     Comment:  left , invasive lobular carcinoma No date: Colon polyps No date: Complication of anesthesia     Comment:  difficulty waking up and sleep apnea, low sats No date: COPD (chronic obstructive pulmonary disease) (HCC) No date: Coronary artery disease No date: Depression No date: GERD (gastroesophageal reflux disease) No date: Hypercholesteremia No date: Hypertension No date: Hypothyroidism 05/06/2015: Personal  history of tobacco use, presenting hazards to  health 05/06/2015: Personal history of tobacco use, presenting hazards to  health No date: Pneumonia     Comment:  hx of No date: PVC (premature ventricular contraction) No date: Skin cancer No date: Sleep apnea     Comment:  wears CPAP nightly No date: Trigeminal neuralgia No date: Trigeminal neuralgia Past Surgical History: 2011: BREAST SURGERY; Bilateral     Comment:  dbl mastectomy No date: COLONOSCOPY W/ POLYPECTOMY 01/21/2016: CRANIECTOMY; Right     Comment:  Procedure: Microvascular Decompression - right               trigemminal nerve;  Surgeon: Lisbeth Renshaw, MD;                Location: [facility];  Service: Neurosurgery;                Laterality: Right; 02/03/2016: CRANIOTOMY; Right     Comment:  Procedure: REPAIR OF CEREBROSPINAL FLUID LEAK AND               HARVEST ABDOMINAL FAT GRAFT;  Surgeon: Lisbeth Renshaw,              MD;  Location: [facility];  Service: Neurosurgery;                Laterality: Right; 08/11/2015: LUMBAR LAMINECTOMY/DECOMPRESSION MICRODISCECTOMY; Left     Comment:  Procedure: Left Lumbar four-five microdiscectomy;                Surgeon: Lisbeth Renshaw, MD;  Location: [facility];               Service: Neurosurgery;  Laterality: Left; 05/2009: MASTECTOMY     Comment:  bilateral, reconstructive surgery 09/2009 No date: MASTECTOMY     Comment:  Bilateral  No date: repair ectropion extensive; Bilateral No date: skin cancer removal BMI    Body Mass Index: 26.31 kg/m     Reproductive/Obstetrics negative OB ROS                             Anesthesia Physical Anesthesia Plan  ASA: 3  Anesthesia Plan: General   Post-op Pain Management:    Induction: Intravenous  PONV Risk Score and Plan: 3 and Propofol infusion and TIVA  Airway Management Planned: Natural Airway and Nasal Cannula  Additional Equipment:   Intra-op Plan:   Post-operative Plan:    Informed Consent: I have reviewed the patients History and Physical, chart, labs and discussed the procedure including the risks, benefits and alternatives for the proposed anesthesia with the patient or authorized representative who has indicated his/her understanding and acceptance.     Dental Advisory Given  Plan Discussed with: CRNA  Anesthesia Plan Comments:         Anesthesia Quick Evaluation

## 2023-04-04 NOTE — Transfer of Care (Signed)
Immediate Anesthesia Transfer of Care Note  Patient: Crystal Meyers  Procedure(s) Performed: COLONOSCOPY WITH PROPOFOL ESOPHAGOGASTRODUODENOSCOPY (EGD) WITH PROPOFOL BIOPSY POLYPECTOMY  Patient Location: Endoscopy Unit  Anesthesia Type:General  Level of Consciousness: drowsy  Airway & Oxygen Therapy: Patient Spontanous Breathing  Post-op Assessment: Report given to RN and Post -op Vital signs reviewed and stable  Post vital signs: Reviewed  Last Vitals:  Vitals Value Taken Time  BP    Temp    Pulse 77 04/04/23 0901  Resp 13 04/04/23 0901  SpO2 96 % 04/04/23 0901  Vitals shown include unfiled device data.  Last Pain:  Vitals:   04/04/23 0740  TempSrc: Temporal  PainSc: 0-No pain         Complications: No notable events documented.

## 2023-04-04 NOTE — Interval H&P Note (Signed)
History and Physical Interval Note:  04/04/2023 8:21 AM  Crystal Meyers  has presented today for surgery, with the diagnosis of 530.81 (ICD-9-CM) - K21.9 (ICD-10-CM) - Gastroesophageal reflux disease without esophagitis578.1 (ICD-9-CM) - K92.1 (ICD-10-CM) - Melena280.0 (ICD-9-CM) - D50.0 (ICD-10-CM) - Iron deficiency anemia due to chronic blood loss.  The various methods of treatment have been discussed with the patient and family. After consideration of risks, benefits and other options for treatment, the patient has consented to  Procedure(s): COLONOSCOPY WITH PROPOFOL (N/A) ESOPHAGOGASTRODUODENOSCOPY (EGD) WITH PROPOFOL (N/A) as a surgical intervention.  The patient's history has been reviewed, patient examined, no change in status, stable for surgery.  I have reviewed the patient's chart and labs.  Questions were answered to the patient's satisfaction.     [place], [place]

## 2023-04-04 NOTE — Op Note (Signed)
[facility] Gastroenterology Patient Name: Crystal Meyers Procedure Date: 04/04/2023 7:23 AM MRN: [id number] Account #: [id number] Date of Birth: 13-Feb-1946 Admit Type: Outpatient Age: 77 Room: [facility] ENDO ROOM 3 Gender: Female Note Status: Finalized Instrument Name: Upper Endoscope [id number] Procedure:             Upper GI endoscopy Indications:           Unexplained iron deficiency anemia, Melena Providers:             Boykin Nearing. Norma Fredrickson MD, MD Referring MD:          Duncan Dull, MD (Referring MD) Medicines:             Propofol per Anesthesia Complications:         No immediate complications. Estimated blood loss:                         Minimal. Procedure:             Pre-Anesthesia Assessment:                        - The risks and benefits of the procedure and the                         sedation options and risks were discussed with the                         patient. All questions were answered and informed                         consent was obtained.                        - Patient identification and proposed procedure were                         verified prior to the procedure by the nurse. The                         procedure was verified in the procedure room.                        - ASA Grade Assessment: III - A patient with severe                         systemic disease.                        - After reviewing the risks and benefits, the patient                         was deemed in satisfactory condition to undergo the                         procedure.                        After obtaining informed consent, the endoscope was                         passed  under direct vision. Throughout the procedure,                         the patient's blood pressure, pulse, and oxygen                         saturations were monitored continuously. The Endoscope                         was introduced through the mouth, and advanced to the                          third part of duodenum. The upper GI endoscopy was                         accomplished without difficulty. The patient tolerated                         the procedure well. 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      The esophagus and gastroesophageal junction were examined with white       light. There was no visual evidence of Barrett's esophagus.      Patchy mildly erythematous mucosa without bleeding was found in the       gastric body. Biopsies were taken with a cold forceps for Helicobacter       pylori testing.      The cardia and gastric fundus were normal on retroflexion.      The examined duodenum was normal. Biopsies for histology were taken with       a cold forceps for evaluation of celiac disease.      The exam was otherwise without abnormality. Impression:            - Glycogenic acanthosis of the esophagus.                        - There is no endoscopic evidence of Barrett's                         esophagus.                        - Erythematous mucosa in the gastric body. Biopsied.                        - Normal examined duodenum. Biopsied.                        - The examination was otherwise normal. Recommendation:        - Await pathology results.                        - Proceed with colonoscopy Procedure Code(s):     --- Professional ---                        [id number], Esophagogastroduodenoscopy, flexible,                         transoral; with biopsy, single or multiple Diagnosis Code(s):     ---  Professional ---                        K92.1, Melena (includes Hematochezia)                        D50.9, Iron deficiency anemia, unspecified                        K31.89, Other diseases of stomach and duodenum                        K22.89, Other specified disease of esophagus CPT copyright 2022 American [employer] Association. All rights reserved. The codes documented in this report are preliminary and upon coder  review may  be revised to meet current compliance requirements. Stanton Kidney MD, MD 04/04/2023 8:43:34 AM This report has been signed electronically. Number of Addenda: 0 Note Initiated On: 04/04/2023 7:23 AM Estimated Blood Loss:  Estimated blood loss was minimal.      [facility]

## 2023-04-04 NOTE — Op Note (Signed)
[facility] Gastroenterology Patient Name: Crystal Meyers Procedure Date: 04/04/2023 7:22 AM MRN: [id number] Account #: [id number] Date of Birth: April 11, 1946 Admit Type: Outpatient Age: 77 Room: [facility] ENDO ROOM 3 Gender: Female Note Status: Finalized Instrument Name: Prentice Docker [id number] Procedure:             Colonoscopy Indications:           Melena, Unexplained iron deficiency anemia Providers:             Boykin Nearing. Norma Fredrickson MD, MD Referring MD:          Duncan Dull, MD (Referring MD) Medicines:             Propofol per Anesthesia Complications:         No immediate complications. Estimated blood loss:                         Minimal. Procedure:             Pre-Anesthesia Assessment:                        - The risks and benefits of the procedure and the                         sedation options and risks were discussed with the                         patient. All questions were answered and informed                         consent was obtained.                        - Patient identification and proposed procedure were                         verified prior to the procedure by the nurse. The                         procedure was verified in the procedure room.                        - ASA Grade Assessment: III - A patient with severe                         systemic disease.                        - After reviewing the risks and benefits, the patient                         was deemed in satisfactory condition to undergo the                         procedure.                        After obtaining informed consent, the colonoscope was                         passed under direct vision.  Throughout the procedure,                         the patient's blood pressure, pulse, and oxygen                         saturations were monitored continuously. The                         Colonoscope was introduced through the anus and                         advanced to the  the cecum, identified by appendiceal                         orifice and ileocecal valve. The colonoscopy was                         performed without difficulty. The patient tolerated                         the procedure well. The quality of the bowel                         preparation was good. The ileocecal valve, appendiceal                         orifice, and rectum were photographed. Findings:      The perianal and digital rectal examinations were normal. Pertinent       negatives include normal sphincter tone and no palpable rectal lesions.      Non-bleeding internal hemorrhoids were found during retroflexion. The       hemorrhoids were Grade I (internal hemorrhoids that do not prolapse).      An 8 mm polyp was found in the transverse colon. The polyp was sessile.       The polyp was removed with a cold snare. Resection and retrieval were       complete.      The exam was otherwise without abnormality. Impression:            - Non-bleeding internal hemorrhoids.                        - One 8 mm polyp in the transverse colon, removed with                         a cold snare. Resected and retrieved.                        - The examination was otherwise normal. Recommendation:        - Await pathology results from EGD, also performed                         today.                        - Patient has a contact number available for  emergencies. The signs and symptoms of potential                         delayed complications were discussed with the patient.                         Return to normal activities tomorrow. Written                         discharge instructions were provided to the patient.                        - Resume previous diet.                        - Continue present medications.                        - If polyps are benign or adenomatous without                         dysplasia, I will advise NO further colonoscopy due to                          advanced age and/or severe comorbidity.                        - To visualize the small bowel, perform video capsule                         endoscopy at appointment to be scheduled.                        - The findings and recommendations were discussed with                         the patient.                        - Return to my office in 2 months.                        - The findings and recommendations were discussed with                         the patient. Procedure Code(s):     --- Professional ---                        [phone], Colonoscopy, flexible; with removal of                         tumor(s), polyp(s), or other lesion(s) by snare                         technique Diagnosis Code(s):     --- Professional ---                        D50.9, Iron deficiency anemia, unspecified  K92.1, Melena (includes Hematochezia)                        K64.0, First degree hemorrhoids                        D12.3, Benign neoplasm of transverse colon (hepatic                         flexure or splenic flexure) CPT copyright 2022 American Medical Association. All rights reserved. The codes documented in this report are preliminary and upon coder review may  be revised to meet current compliance requirements. Stanton Kidney MD, MD 04/04/2023 9:05:32 AM This report has been signed electronically. Number of Addenda: 0 Note Initiated On: 04/04/2023 7:22 AM Scope Withdrawal Time: 0 hours 6 minutes 53 seconds  Total Procedure Duration: 0 hours 11 minutes 59 seconds  Estimated Blood Loss:  Estimated blood loss was minimal.      [facility]

## 2023-04-04 NOTE — H&P (Signed)
Outpatient short stay form Pre-procedure 04/04/2023 8:19 AM Crystal Meyers K. Norma Fredrickson, M.D.  Primary Physician: Duncan Dull, M.D.  Reason for visit:  melena, iron deficiency anemia, GERD, Hx adenomatous colon polyps  History of present illness:  Crystal Meyers presents with 2 to 3 months of progressive melena, as well as "hard stools". She does describe the stools as "black and firm" and difficult to evacuate. She is using some fiber supplements with varying results on ease of defecation. Patient denies any hematochezia or involuntary weight loss. She does have a history of GERD for which she takes Prilosec daily without breakthrough symptoms. She denies any dysphagia, nausea or vomiting. She takes a baby aspirin daily but no other NSAIDs or blood thinners. Patient does have mild left lower quadrant discomfort but this is relieved with having a bowel movement.  Patient appears to be concerned over chances of colon cancer  Had EGD and colonoscopy by me on 04/20/2020 revealing 5 polyps ( mix of adenomatous and hyperplastic histology) and possible Barrett's (Biopsies were negative for Barrett's).     Current Facility-Administered Medications:    0.9 %  sodium chloride infusion, , Intravenous, Continuous, [place], Boykin Nearing, MD, Last Rate: 20 mL/hr at 04/04/23 0750, New Bag at 04/04/23 0750  Medications Prior to Admission  Medication Sig Dispense Refill Last Dose   levothyroxine (SYNTHROID) 88 MCG tablet TAKE 1 TABLET EVERY DAY BEFORE BREAKFAST 90 tablet 3 04/03/2023   losartan (COZAAR) 50 MG tablet Take 1 tablet (50 mg total) by mouth daily. TAKE 1 TABLET BY MOUTH EVERYDAY AT BEDTIME 90 tablet 1 04/03/2023   omeprazole (PRILOSEC) 40 MG capsule TAKE 1 CAPSULE EVERY DAY 90 capsule 3 04/03/2023   Turmeric 500 MG CAPS Take 2 capsules by mouth daily.   Past Month   venlafaxine XR (EFFEXOR-XR) 150 MG 24 hr capsule TAKE 1 CAPSULE EVERY DAY 90 capsule 3 04/03/2023   vitamin B-12 (CYANOCOBALAMIN) 1000 MCG tablet Take  1,000 mcg by mouth daily.   04/03/2023   aspirin EC 81 MG tablet Take 1 tablet (81 mg total) by mouth daily. 90 tablet 1    atorvastatin (LIPITOR) 40 MG tablet TAKE 1 TABLET EVERY DAY 90 tablet 3    B Complex-C (B-COMPLEX WITH VITAMIN C) tablet Take 1 tablet by mouth daily.      blood glucose meter kit and supplies Dispense based on patient and insurance preference. Use to check sugar once daily as directed. (FOR ICD-10 E10.9, E11.9). 1 each 0    Blood Glucose Monitoring Suppl DEVI 1 each by Does not apply route in the morning, at noon, and at bedtime. May substitute to any manufacturer covered by patient's insurance. 1 each 0    Continuous Glucose Receiver (FREESTYLE LIBRE 2 READER) DEVI Use to check blood sugars continuously. 1 each 0    Continuous Glucose Sensor (FREESTYLE LIBRE 2 SENSOR) MISC Apply 1 sensor to arm every two weeks 2 each 2    dapagliflozin propanediol (FARXIGA) 10 MG TABS tablet Take 1 tablet (10 mg total) by mouth daily. 90 tablet 3    glucose blood (ONETOUCH VERIO) test strip USE TO CHECK SUGAR ONCE DAILY E11.9 (VERIO PER PT) 100 strip 3    Lancets (ONETOUCH DELICA PLUS LANCET30G) MISC USE TO CHECK SUGAR ONCE DAILY E11.9 (DELICA PER PT) 100 each 3    nicotine polacrilex (NICORETTE) 2 MG gum Take 2 mg by mouth as needed for smoking cessation.      nitroGLYCERIN (NITROSTAT) 0.4 MG SL tablet Place under  the tongue.      rOPINIRole (REQUIP) 0.25 MG tablet Take 1 tablet (0.25 mg total) by mouth at bedtime as needed. 90 tablet 1    traZODone (DESYREL) 100 MG tablet Take 1.5 tablets (150 mg total) by mouth at bedtime. 45 tablet 5    Vitamin D, Cholecalciferol, 25 MCG (1000 UT) TABS Take 1 tablet by mouth daily.        Allergies  Allergen Reactions   Lamisil [Terbinafine] Rash   No Known Allergies Rash     Past Medical History:  Diagnosis Date   Anxiety    Asthma    Barrett's esophagus    Breast cancer (HCC) 01/2009   left , invasive lobular carcinoma   Colon polyps     Complication of anesthesia    difficulty waking up and sleep apnea, low sats   COPD (chronic obstructive pulmonary disease) (HCC)    Coronary artery disease    Depression    GERD (gastroesophageal reflux disease)    Hypercholesteremia    Hypertension    Hypothyroidism    IDA (iron deficiency anemia)    Personal history of tobacco use, presenting hazards to health 05/06/2015   Personal history of tobacco use, presenting hazards to health 05/06/2015   Pneumonia    hx of   PVC (premature ventricular contraction)    Skin cancer    Sleep apnea    wears CPAP nightly   Tobacco abuse 07/27/2011   She resumed smoking after her diagnosis of breast cancer but stopped 3 days ago.    Trigeminal neuralgia    Trigeminal neuralgia     Review of systems:  Otherwise negative.    Physical Exam  Gen: Alert, oriented. Appears stated age.  HEENT: [place]/AT. PERRLA. Lungs: CTA, no wheezes. CV: RR nl S1, S2. Abd: soft, benign, no masses. BS+ Ext: No edema. Pulses 2+    Planned procedures: Proceed with EGD and colonoscopy. The patient understands the nature of the planned procedure, indications, risks, alternatives and potential complications including but not limited to bleeding, infection, perforation, damage to internal organs and possible oversedation/side effects from anesthesia. The patient agrees and gives consent to proceed.  Please refer to procedure notes for findings, recommendations and patient disposition/instructions.     Efstathios Sawin K. Norma Fredrickson, M.D. Gastroenterology 04/04/2023  8:19 AM

## 2023-04-05 ENCOUNTER — Encounter: Payer: Self-pay | Admitting: Internal Medicine

## 2023-04-06 LAB — SURGICAL PATHOLOGY

## 2023-04-11 ENCOUNTER — Ambulatory Visit (INDEPENDENT_AMBULATORY_CARE_PROVIDER_SITE_OTHER): Payer: Medicare HMO | Admitting: *Deleted

## 2023-04-11 VITALS — Ht 66.0 in | Wt 156.0 lb

## 2023-04-11 DIAGNOSIS — Z0000 Encounter for general adult medical examination without abnormal findings: Secondary | ICD-10-CM

## 2023-04-11 NOTE — Patient Instructions (Signed)
Ms. Gibb , Thank you for taking time to come for your Medicare Wellness Visit. I appreciate your ongoing commitment to your health goals. Please review the following plan we discussed and let me know if I can assist you in the future.   Referrals/Orders/Follow-Ups/Clinician Recommendations: Remember to update your vaccines   This is a list of the screening recommended for you and due dates:  Health Maintenance  Topic Date Due   COVID-19 Vaccine (3 - Pfizer risk series) 04/10/2020   Eye exam for diabetics  11/20/2022   Flu Shot  02/09/2023   Yearly kidney health urinalysis for diabetes  07/07/2023   Hemoglobin A1C  07/12/2023   Screening for Lung Cancer  10/06/2023   Yearly kidney function blood test for diabetes  01/09/2024   Complete foot exam   01/09/2024   Medicare Annual Wellness Visit  04/10/2024   Colon Cancer Screening  04/03/2028   DTaP/Tdap/Td vaccine (2 - Td or Tdap) 04/13/2028   Pneumonia Vaccine  Completed   DEXA scan (bone density measurement)  Completed   Hepatitis C Screening  Completed   Zoster (Shingles) Vaccine  Completed   HPV Vaccine  Aged Out    Advanced directives: (Copy Requested) Please bring a copy of your health care power of attorney and living will to the office to be added to your chart at your convenience.  Next Medicare Annual Wellness Visit scheduled for next year: Yes 04/12/24 @ 10:30

## 2023-04-11 NOTE — Progress Notes (Signed)
Subjective:   Crystal Meyers is a 77 y.o. female who presents for Medicare Annual (Subsequent) preventive examination.  Visit Complete: Virtual  I connected with  Crystal Meyers on 04/11/23 by a audio enabled telemedicine application and verified that I am speaking with the correct person using two identifiers.  Patient Location: Home  Provider Location: Office/Clinic  I discussed the limitations of evaluation and management by telemedicine. The patient expressed understanding and agreed to proceed.  Because this visit was a virtual/telehealth visit, some criteria may be missing or patient reported. Any vitals not documented were not able to be obtained and vitals that have been documented are patient reported.    Cardiac Risk Factors include: advanced age (>31men, >70 women);diabetes mellitus;dyslipidemia;hypertension;Other (see comment), Risk factor comments: CAD     Objective:    Today's Vitals   04/11/23 0950  Weight: 156 lb (70.8 kg)  Height: 5\' 6"  (1.676 m)   Body mass index is 25.18 kg/m.     04/11/2023   10:07 AM 04/04/2023    7:36 AM 04/06/2022   11:29 AM 06/22/2021   10:37 AM 04/01/2021   10:27 AM 06/08/2020   10:28 AM 04/20/2020    8:56 AM  Advanced Directives  Does Patient Have a Medical Advance Directive? Yes Yes Yes Yes Yes Yes Yes  Type of Estate agent of [place];Living will Healthcare Power of [place];Living will Healthcare Power of [place];Living will Living will;Healthcare Power of [employer] Power of [place];Living will Healthcare Power of [place];Living will Living will  Does patient want to make changes to medical advance directive?    Yes (ED - Information included in AVS) No - Patient declined No - Patient declined   Copy of Healthcare Power of Attorney in Chart? No - copy requested  No - copy requested  No - copy requested Yes - validated most recent copy scanned in chart (See row information)     Current  Medications (verified) Outpatient Encounter Medications as of 04/11/2023  Medication Sig   atorvastatin (LIPITOR) 40 MG tablet TAKE 1 TABLET EVERY DAY   blood glucose meter kit and supplies Dispense based on patient and insurance preference. Use to check sugar once daily as directed. (FOR ICD-10 E10.9, E11.9).   Blood Glucose Monitoring Suppl DEVI 1 each by Does not apply route in the morning, at noon, and at bedtime. May substitute to any manufacturer covered by patient's insurance.   Continuous Glucose Receiver (FREESTYLE LIBRE 2 READER) DEVI Use to check blood sugars continuously.   Continuous Glucose Sensor (FREESTYLE LIBRE 2 SENSOR) MISC Apply 1 sensor to arm every two weeks   dapagliflozin propanediol (FARXIGA) 10 MG TABS tablet Take 1 tablet (10 mg total) by mouth daily.   glucose blood (ONETOUCH VERIO) test strip USE TO CHECK SUGAR ONCE DAILY E11.9 (VERIO PER PT)   Lancets (ONETOUCH DELICA PLUS LANCET30G) MISC USE TO CHECK SUGAR ONCE DAILY E11.9 (DELICA PER PT)   levothyroxine (SYNTHROID) 88 MCG tablet TAKE 1 TABLET EVERY DAY BEFORE BREAKFAST   losartan (COZAAR) 50 MG tablet Take 1 tablet (50 mg total) by mouth daily. TAKE 1 TABLET BY MOUTH EVERYDAY AT BEDTIME   nicotine polacrilex (NICORETTE) 2 MG gum Take 2 mg by mouth as needed for smoking cessation.   omeprazole (PRILOSEC) 40 MG capsule TAKE 1 CAPSULE EVERY DAY   rOPINIRole (REQUIP) 0.25 MG tablet Take 1 tablet (0.25 mg total) by mouth at bedtime as needed.   traZODone (DESYREL) 100 MG tablet Take 1.5  tablets (150 mg total) by mouth at bedtime.   venlafaxine XR (EFFEXOR-XR) 150 MG 24 hr capsule TAKE 1 CAPSULE EVERY DAY   aspirin EC 81 MG tablet Take 1 tablet (81 mg total) by mouth daily. (Patient not taking: Reported on 04/11/2023)   B Complex-C (B-COMPLEX WITH VITAMIN C) tablet Take 1 tablet by mouth daily. (Patient not taking: Reported on 04/11/2023)   nitroGLYCERIN (NITROSTAT) 0.4 MG SL tablet Place under the tongue. (Patient not  taking: Reported on 04/11/2023)   Turmeric 500 MG CAPS Take 2 capsules by mouth daily. (Patient not taking: Reported on 04/11/2023)   vitamin B-12 (CYANOCOBALAMIN) 1000 MCG tablet Take 1,000 mcg by mouth daily. (Patient not taking: Reported on 04/11/2023)   Vitamin D, Cholecalciferol, 25 MCG (1000 UT) TABS Take 1 tablet by mouth daily. (Patient not taking: Reported on 04/11/2023)   No facility-administered encounter medications on file as of 04/11/2023.    Allergies (verified) Lamisil [terbinafine] and No known allergies   History: Past Medical History:  Diagnosis Date   Anxiety    Asthma    Barrett's esophagus    Breast cancer (HCC) 01/2009   left , invasive lobular carcinoma   Colon polyps    Complication of anesthesia    difficulty waking up and sleep apnea, low sats   COPD (chronic obstructive pulmonary disease) (HCC)    Coronary artery disease    Depression    GERD (gastroesophageal reflux disease)    Hypercholesteremia    Hypertension    Hypothyroidism    IDA (iron deficiency anemia)    Personal history of tobacco use, presenting hazards to health 05/06/2015   Personal history of tobacco use, presenting hazards to health 05/06/2015   Pneumonia    hx of   PVC (premature ventricular contraction)    Skin cancer    Sleep apnea    wears CPAP nightly   Tobacco abuse 07/27/2011   She resumed smoking after her diagnosis of breast cancer but stopped 3 days ago.    Trigeminal neuralgia    Trigeminal neuralgia    Past Surgical History:  Procedure Laterality Date   BIOPSY  04/04/2023   Procedure: BIOPSY;  Surgeon: Norma Fredrickson, Boykin Nearing, MD;  Location: [facility] ENDOSCOPY;  Service: Gastroenterology;;   BREAST SURGERY Bilateral 2011   dbl mastectomy   COLONOSCOPY W/ POLYPECTOMY     COLONOSCOPY WITH PROPOFOL N/A 04/20/2020   Procedure: COLONOSCOPY WITH PROPOFOL;  Surgeon: Toledo, Boykin Nearing, MD;  Location: [facility] ENDOSCOPY;  Service: Gastroenterology;  Laterality: N/A;   COLONOSCOPY WITH  PROPOFOL N/A 04/04/2023   Procedure: COLONOSCOPY WITH PROPOFOL;  Surgeon: Toledo, Boykin Nearing, MD;  Location: [facility] ENDOSCOPY;  Service: Gastroenterology;  Laterality: N/A;   CRANIECTOMY Right 01/21/2016   Procedure: Microvascular Decompression - right trigemminal nerve;  Surgeon: Lisbeth Renshaw, MD;  Location: [facility] NEURO ORS;  Service: Neurosurgery;  Laterality: Right;   CRANIOTOMY Right 02/03/2016   Procedure: REPAIR OF CEREBROSPINAL FLUID LEAK AND HARVEST ABDOMINAL FAT GRAFT;  Surgeon: Lisbeth Renshaw, MD;  Location: [facility] NEURO ORS;  Service: Neurosurgery;  Laterality: Right;   ESOPHAGOGASTRODUODENOSCOPY N/A 04/20/2020   Procedure: ESOPHAGOGASTRODUODENOSCOPY (EGD);  Surgeon: Toledo, Boykin Nearing, MD;  Location: [facility] ENDOSCOPY;  Service: Gastroenterology;  Laterality: N/A;   ESOPHAGOGASTRODUODENOSCOPY (EGD) WITH PROPOFOL N/A 04/04/2023   Procedure: ESOPHAGOGASTRODUODENOSCOPY (EGD) WITH PROPOFOL;  Surgeon: Toledo, Boykin Nearing, MD;  Location: [facility] ENDOSCOPY;  Service: Gastroenterology;  Laterality: N/A;   LUMBAR LAMINECTOMY/DECOMPRESSION MICRODISCECTOMY Left 08/11/2015   Procedure: Left Lumbar four-five microdiscectomy;  Surgeon: Lisbeth Renshaw,  MD;  Location: [facility] NEURO ORS;  Service: Neurosurgery;  Laterality: Left;   MASTECTOMY  05/2009   bilateral, reconstructive surgery 09/2009   MASTECTOMY     Bilateral    POLYPECTOMY  04/04/2023   Procedure: POLYPECTOMY;  Surgeon: Toledo, Boykin Nearing, MD;  Location: [facility] ENDOSCOPY;  Service: Gastroenterology;;   repair ectropion extensive Bilateral    skin cancer removal     Family History  Problem Relation Age of Onset   Mental illness Mother    Diabetes Mother    Hypertension Mother    Hyperlipidemia Mother    Alzheimer's disease Mother    Hypertension Father    Hyperlipidemia Father    Stroke Father    Heart attack Father    Diabetes Father    Deep vein thrombosis Father    Colon cancer Father    Cancer Brother        Lung   Diabetes Brother     Diabetes Brother    Stroke Maternal Grandmother    Social History   Socioeconomic History   Marital status: Widowed    Spouse name: Not on file   Number of children: Not on file   Years of education: Not on file   Highest education level: Not on file  Occupational History   Not on file  Tobacco Use   Smoking status: Former    Current packs/day: 0.00    Average packs/day: 1 pack/day for 54.0 years (54.0 ttl pk-yrs)    Types: Cigarettes    Start date: 04/01/1967    Quit date: 03/31/2021    Years since quitting: 2.0   Smokeless tobacco: Never   Tobacco comments:    Refused cessation material  Vaping Use   Vaping status: Never Used  Substance and Sexual Activity   Alcohol use: No   Drug use: No   Sexual activity: Never  Other Topics Concern   Not on file  Social History Narrative   Lives with mother.   Recently widowed.   Always uses seat belts   Has a bird and a dog.   No exercise.   Social Determinants of Health   Financial Resource Strain: Low Risk  (04/11/2023)   Overall Financial Resource Strain (CARDIA)    Difficulty of Paying Living Expenses: Not hard at all  Food Insecurity: No Food Insecurity (04/11/2023)   Hunger Vital Sign    Worried About Running Out of Food in the Last Year: Never true    Ran Out of Food in the Last Year: Never true  Transportation Needs: No Transportation Needs (04/11/2023)   PRAPARE - Administrator, Civil Service (Medical): No    Lack of Transportation (Non-Medical): No  Physical Activity: Inactive (04/11/2023)   Exercise Vital Sign    Days of Exercise per Week: 0 days    Minutes of Exercise per Session: 0 min  Stress: No Stress Concern Present (04/11/2023)   [employer] of Occupational Health - Occupational Stress Questionnaire    Feeling of Stress : Not at all  Social Connections: Moderately Isolated (04/11/2023)   Social Connection and Isolation Panel [NHANES]    Frequency of Communication with Friends and Family:  More than three times a week    Frequency of Social Gatherings with Friends and Family: More than three times a week    Attends Religious Services: More than 4 times per year    Active Member of [employer] or Organizations: No    Attends Banker Meetings: Never  Marital Status: Widowed    Tobacco Counseling Counseling given: Not Answered Tobacco comments: Refused cessation material   Clinical Intake:  Pre-visit preparation completed: Yes  Pain : No/denies pain     BMI - recorded: 25.18 Nutritional Status: BMI 25 -29 Overweight Nutritional Risks: None Diabetes: Yes CBG done?: No  How often do you need to have someone help you when you read instructions, pamphlets, or other written materials from your doctor or pharmacy?: 1 - Never  Interpreter Needed?: No  Information entered by :: R. Kamy Poinsett LPN   Activities of Daily Living    04/11/2023    9:53 AM  In your present state of health, do you have any difficulty performing the following activities:  Hearing? 0  Vision? 0  Comment readers  Difficulty concentrating or making decisions? 0  Walking or climbing stairs? 0  Dressing or bathing? 0  Doing errands, shopping? 0  Preparing Food and eating ? N  Using the Toilet? N  In the past six months, have you accidently leaked urine? N  Do you have problems with loss of bowel control? N  Managing your Medications? N  Managing your Finances? N  Housekeeping or managing your Housekeeping? N    Patient Care Team: Sherlene Shams, MD as PCP - General (Internal Medicine) Linus Salmons, MD (Unknown Physician Specialty)  Indicate any recent Medical Services you may have received from other than [facility] providers in the past year (date may be approximate).     Assessment:   This is a routine wellness examination for Rendy.  Hearing/Vision screen Hearing Screening - Comments:: No issues Vision Screening - Comments:: readers   Goals Addressed              This Visit's Progress    Patient Stated       Wants to work on getting her kidneys back in order so she can eat better       Depression Screen    04/11/2023   10:01 AM 01/09/2023    9:58 AM 07/06/2022    9:33 AM 04/06/2022   11:28 AM 04/04/2022   10:11 AM 09/30/2021   10:41 AM 04/01/2021   10:24 AM  PHQ 2/9 Scores  PHQ - 2 Score 0 0 0 0 1 0 0  PHQ- 9 Score 1 0 0 0 3 0 0    Fall Risk    04/11/2023    9:55 AM 01/09/2023    9:58 AM 10/11/2022    1:49 PM 07/06/2022    9:33 AM 04/06/2022   11:30 AM  Fall Risk   Falls in the past year? 0 0 0 0 0  Number falls in past yr: 0 0 0  0  Injury with Fall? 0 0 0  0  Risk for fall due to : No Fall Risks No Fall Risks No Fall Risks No Fall Risks No Fall Risks;Impaired vision  Follow up Falls prevention discussed;Falls evaluation completed Falls evaluation completed Falls evaluation completed Falls evaluation completed Falls prevention discussed    MEDICARE RISK AT HOME: Medicare Risk at Home Any stairs in or around the home?: Yes If so, are there any without handrails?: No Home free of loose throw rugs in walkways, pet beds, electrical cords, etc?: Yes Adequate lighting in your home to reduce risk of falls?: Yes Life alert?: No Use of a cane, walker or w/c?: No Grab bars in the bathroom?: No Shower chair or bench in shower?: No Elevated toilet seat or a handicapped  toilet?: Yes      Cognitive Function:        04/11/2023   10:08 AM 04/06/2022   11:30 AM 04/01/2021   10:33 AM 03/31/2020   10:26 AM 03/21/2019   11:27 AM  6CIT Screen  What Year? 0 points 0 points 0 points 0 points 0 points  What month? 0 points 0 points 0 points 0 points 0 points  What time? 0 points 0 points 0 points  0 points  Count back from 20 0 points 0 points 0 points 0 points 0 points  Months in reverse 0 points 0 points 0 points 0 points 0 points  Repeat phrase 2 points 0 points 0 points 0 points 0 points  Total Score 2 points 0 points 0 points  0 points     Immunizations Immunization History  Administered Date(s) Administered   Fluad Quad(high Dose 65+) 04/10/2019, 04/08/2020, 06/28/2021, 04/04/2022   Influenza, High Dose Seasonal PF 06/09/2017, 03/15/2018   Influenza,inj,Quad PF,6+ Mos 09/05/2016   PFIZER(Purple Top)SARS-COV-2 Vaccination 02/19/2020, 03/13/2020   Pneumococcal Conjugate-13 11/25/2013   Pneumococcal Polysaccharide-23 11/26/2006, 07/03/2015   Respiratory Syncytial Virus Vaccine,Recomb Aduvanted(Arexvy) 05/14/2022   Tdap 04/13/2018   Zoster Recombinant(Shingrix) 10/11/2021, 01/06/2022    TDAP status: Up to date  Flu Vaccine status: Due, Education has been provided regarding the importance of this vaccine. Advised may receive this vaccine at local pharmacy or Health Dept. Aware to provide a copy of the vaccination record if obtained from local pharmacy or Health Dept. Verbalized acceptance and understanding.  Pneumococcal vaccine status: Up to date  Covid-19 vaccine status: Declined, Education has been provided regarding the importance of this vaccine but patient still declined. Advised may receive this vaccine at local pharmacy or Health Dept.or vaccine clinic. Aware to provide a copy of the vaccination record if obtained from local pharmacy or Health Dept. Verbalized acceptance and understanding.  Qualifies for Shingles Vaccine? Yes   Zostavax completed No   Shingrix Completed?: Yes  Screening Tests Health Maintenance  Topic Date Due   COVID-19 Vaccine (3 - Pfizer risk series) 04/10/2020   OPHTHALMOLOGY EXAM  11/20/2022   INFLUENZA VACCINE  02/09/2023   Medicare Annual Wellness (AWV)  04/07/2023   Diabetic kidney evaluation - Urine ACR  07/07/2023   HEMOGLOBIN A1C  07/12/2023   Lung Cancer Screening  10/06/2023   Diabetic kidney evaluation - eGFR measurement  01/09/2024   FOOT EXAM  01/09/2024   Colonoscopy  04/03/2028   DTaP/Tdap/Td (2 - Td or Tdap) 04/13/2028   Pneumonia Vaccine 7+ Years old  Completed    DEXA SCAN  Completed   Hepatitis C Screening  Completed   Zoster Vaccines- Shingrix  Completed   HPV VACCINES  Aged Out    Health Maintenance  Health Maintenance Due  Topic Date Due   COVID-19 Vaccine (3 - Pfizer risk series) 04/10/2020   OPHTHALMOLOGY EXAM  11/20/2022   INFLUENZA VACCINE  02/09/2023   Medicare Annual Wellness (AWV)  04/07/2023    Colorectal cancer screening: Type of screening: Colonoscopy. Completed 03/2023. Repeat every 5 years  Mammogram status: No longer required due to surgery.  Bone Density status: Completed 06/2020. Results reflect: Bone density results: NORMAL. Repeat every 2 years. Patient will discuss with PCP at next visit  Lung Cancer Screening: (Low Dose CT Chest recommended if Age 61-80 years, 20 pack-year currently smoking OR have quit w/in 15years.) does qualify.   Lung Cancer Screening Referral: Order placed 10/2022  Additional Screening:  Hepatitis C Screening:  does qualify; Completed 02/2015  Vision Screening: Recommended annual ophthalmology exams for early detection of glaucoma and other disorders of the eye. Is the patient up to date with their annual eye exam?  Yes  Who is the provider or what is the name of the office in which the patient attends annual eye exams? [facility] If pt is not established with a provider, would they like to be referred to a provider to establish care? No .   Dental Screening: Recommended annual dental exams for proper oral hygiene  Diabetic Foot Exam: Diabetic Foot Exam: Completed 01/2023  Community Resource Referral / Chronic Care Management: CRR required this visit?  No   CCM required this visit?  No     Plan:     I have personally reviewed and noted the following in the patient's chart:   Medical and social history Use of alcohol, tobacco or illicit drugs  Current medications and supplements including opioid prescriptions. Patient is not currently taking opioid  prescriptions. Functional ability and status Nutritional status Physical activity Advanced directives List of other physicians Hospitalizations, surgeries, and ER visits in previous 12 months Vitals Screenings to include cognitive, depression, and falls Referrals and appointments  In addition, I have reviewed and discussed with patient certain preventive protocols, quality metrics, and best practice recommendations. A written personalized care plan for preventive services as well as general preventive health recommendations were provided to patient.     Sydell Axon, LPN   16/07/958   After Visit Summary: (MyChart) Due to this being a telephonic visit, the after visit summary with patients personalized plan was offered to patient via MyChart   Nurse Notes: None

## 2023-04-22 NOTE — Anesthesia Postprocedure Evaluation (Signed)
Anesthesia Post Note  Patient: Crystal Meyers  Procedure(s) Performed: COLONOSCOPY WITH PROPOFOL ESOPHAGOGASTRODUODENOSCOPY (EGD) WITH PROPOFOL BIOPSY POLYPECTOMY  Patient location during evaluation: Endoscopy Anesthesia Type: General Level of consciousness: awake and alert Pain management: pain level controlled Vital Signs Assessment: post-procedure vital signs reviewed and stable Respiratory status: spontaneous breathing, nonlabored ventilation, respiratory function stable and patient connected to nasal cannula oxygen Cardiovascular status: blood pressure returned to baseline and stable Postop Assessment: no apparent nausea or vomiting Anesthetic complications: no   No notable events documented.   Last Vitals:  Vitals:   04/04/23 0910 04/04/23 0920  BP: (!) 117/56 115/63  Pulse: 77 73  Resp: 17 13  Temp: (!) 36.4 C (!) 36.4 C  SpO2: 98% 97%    Last Pain:  Vitals:   04/05/23 0740  TempSrc:   PainSc: 0-No pain                 Lenard Simmer

## 2023-05-16 DIAGNOSIS — N1831 Chronic kidney disease, stage 3a: Secondary | ICD-10-CM | POA: Diagnosis not present

## 2023-05-16 DIAGNOSIS — E1122 Type 2 diabetes mellitus with diabetic chronic kidney disease: Secondary | ICD-10-CM | POA: Diagnosis not present

## 2023-05-22 DIAGNOSIS — I10 Essential (primary) hypertension: Secondary | ICD-10-CM | POA: Diagnosis not present

## 2023-05-22 DIAGNOSIS — D631 Anemia in chronic kidney disease: Secondary | ICD-10-CM | POA: Diagnosis not present

## 2023-05-22 DIAGNOSIS — E1122 Type 2 diabetes mellitus with diabetic chronic kidney disease: Secondary | ICD-10-CM | POA: Diagnosis not present

## 2023-05-22 DIAGNOSIS — N1831 Chronic kidney disease, stage 3a: Secondary | ICD-10-CM | POA: Diagnosis not present

## 2023-05-24 NOTE — Telephone Encounter (Signed)
error 

## 2023-06-02 ENCOUNTER — Other Ambulatory Visit: Payer: Self-pay | Admitting: Internal Medicine

## 2023-06-02 DIAGNOSIS — I10 Essential (primary) hypertension: Secondary | ICD-10-CM

## 2023-06-15 ENCOUNTER — Telehealth: Payer: Self-pay

## 2023-06-15 NOTE — Telephone Encounter (Signed)
Mailed APP for Crystal Meyers to Patient for Re-Enroll 2025.

## 2023-06-15 NOTE — Telephone Encounter (Signed)
Pt has not responded to Mychart message regarding 2025 re enrollment through [employer] for Farxiga 10mg 

## 2023-07-07 ENCOUNTER — Other Ambulatory Visit (HOSPITAL_COMMUNITY): Payer: Self-pay

## 2023-07-07 ENCOUNTER — Telehealth: Payer: Self-pay | Admitting: Pharmacy Technician

## 2023-07-07 ENCOUNTER — Telehealth: Payer: Self-pay

## 2023-07-07 NOTE — Telephone Encounter (Signed)
Attempted to call pt- No answer/No voicemail.  

## 2023-07-07 NOTE — Telephone Encounter (Signed)
Pharmacy Patient Advocate Encounter   Received notification from CoverMyMeds that prior authorization for FREESTYLE LIBRE 2 SENSORS is required/requested.   Insurance verification completed.   The patient is insured through [place] .   Per test claim: PA required and submitted KEY/EOC/Request #: [id number] APPROVED from 07/12/2023 to 07/10/2024. Ran test claim, Copay is $25.65. This test claim was processed through [facility]- copay amounts may vary at other pharmacies due to pharmacy/plan contracts, or as the patient moves through the different stages of their insurance plan.   **See approval letter uploaded in pt's chart under Media.**

## 2023-07-07 NOTE — Telephone Encounter (Signed)
Pharmacy Patient Advocate Encounter  Received notification from [facility] that Prior Authorization for FREESTYLE LIBRE 2 SENSOR has been APPROVED from 07/12/23 to 07/10/24   PA #/Case ID/Reference #: [id number]

## 2023-07-13 ENCOUNTER — Ambulatory Visit: Payer: Medicare HMO | Admitting: Internal Medicine

## 2023-07-24 ENCOUNTER — Ambulatory Visit: Payer: Medicare HMO | Admitting: Internal Medicine

## 2023-08-02 NOTE — Telephone Encounter (Signed)
2 Attempts for outreach have been made, via Mychart and Mail

## 2023-08-03 NOTE — Telephone Encounter (Signed)
Spoke with pt and she stated that she thought her application had been approved. I advised pt that she was mailed an application for her to fill out and bring back to office. Pt stated that she would have to find the paperwork and then bring it with her to her next appt on 08/09/2023.

## 2023-08-03 NOTE — Telephone Encounter (Signed)
It didn't sound like she received a letter because she stated that someone told her that she "had gotten it". If pt is unable to find I will let you know.

## 2023-08-09 ENCOUNTER — Ambulatory Visit: Payer: Medicare HMO | Admitting: Internal Medicine

## 2023-08-09 ENCOUNTER — Encounter: Payer: Self-pay | Admitting: Internal Medicine

## 2023-08-09 VITALS — BP 124/78 | HR 71 | Ht 66.0 in | Wt 170.8 lb

## 2023-08-09 DIAGNOSIS — I10 Essential (primary) hypertension: Secondary | ICD-10-CM

## 2023-08-09 DIAGNOSIS — R419 Unspecified symptoms and signs involving cognitive functions and awareness: Secondary | ICD-10-CM | POA: Diagnosis not present

## 2023-08-09 DIAGNOSIS — N182 Chronic kidney disease, stage 2 (mild): Secondary | ICD-10-CM | POA: Diagnosis not present

## 2023-08-09 DIAGNOSIS — E785 Hyperlipidemia, unspecified: Secondary | ICD-10-CM | POA: Diagnosis not present

## 2023-08-09 DIAGNOSIS — E034 Atrophy of thyroid (acquired): Secondary | ICD-10-CM

## 2023-08-09 DIAGNOSIS — E559 Vitamin D deficiency, unspecified: Secondary | ICD-10-CM

## 2023-08-09 DIAGNOSIS — Z7984 Long term (current) use of oral hypoglycemic drugs: Secondary | ICD-10-CM

## 2023-08-09 DIAGNOSIS — E538 Deficiency of other specified B group vitamins: Secondary | ICD-10-CM

## 2023-08-09 DIAGNOSIS — E1169 Type 2 diabetes mellitus with other specified complication: Secondary | ICD-10-CM | POA: Diagnosis not present

## 2023-08-09 DIAGNOSIS — E1122 Type 2 diabetes mellitus with diabetic chronic kidney disease: Secondary | ICD-10-CM | POA: Diagnosis not present

## 2023-08-09 DIAGNOSIS — F339 Major depressive disorder, recurrent, unspecified: Secondary | ICD-10-CM

## 2023-08-09 NOTE — Telephone Encounter (Signed)
Pt portion has been completed. I have placed the forms in Dr. Melina Schools quick sign folder for her signature on provider portion.

## 2023-08-09 NOTE — Patient Instructions (Addendum)
You have gained weight because of the  high calorie food choices you have been making.  There are substitutions to satisfy your cravings:   [employer] "low carb power bowl"  entrees and  "Steamer" entrees are are great low carb entrees that microwave in 5 minutes      Also try  HC's fudgsicles and fudge brownies (BJ;s)  Bjs; also  carries RealGood and Veggies Made Great !  Which is a line that Makes a low carb spinach & egg white frittatas  Try the premier protein latte and caramel flavors  instead of your 600 calorie latte from [employer]'s

## 2023-08-09 NOTE — Progress Notes (Unsigned)
Subjective:  Patient ID: Crystal Meyers, female    DOB: March 13, 1946  Age: 78 y.o. MRN: [id number]  CC: The primary encounter diagnosis was Essential hypertension. Diagnoses of Hyperlipidemia associated with type 2 diabetes mellitus (HCC), Type 2 diabetes mellitus with stage 2 chronic kidney disease, without long-term current use of insulin (HCC), Hypothyroidism due to acquired atrophy of thyroid, Cognitive complaints, B12 deficiency, Vitamin D deficiency, and Major depressive disorder, recurrent episode with melancholic features (HCC) were also pertinent to this visit.   HPI Crystal Meyers presents for  Chief Complaint  Patient presents with   Medical Management of Chronic Issues   1)  T2DM:  She  feels generally well,  is walking daily for exercise  but  has  gained 16 lbs.  Using low cal bread and eating chicken salad,  sweet potatios daily.  Eating chocolate chip muffins  and drinking [employer] Lattes (600 cal)  t. Checking  blood sugars less than once daily at variable times, usually only if she feels she may be having a hypoglycemic event. .  BS have been under 130 fasting and < 150 post prandially.  Denies any recent hypoglyemic events.  Taking   medications as directed. Following a carbohydrate modified diet 6 days per week. Denies numbness, burning and tingling of extremities. Appetite is good.    2)  cognitive complaints :forgetting names  and places she doesn't frequent often.  No hobbies except cleaning and visiting family .  Not forgetting meds.   No trouble balancing checkbook, paying bills,  etc    Lab Results  Component Value Date   HGBA1C 6.3 08/09/2023   Lab Results  Component Value Date   TSH 2.26 08/09/2023      Outpatient Medications Prior to Visit  Medication Sig Dispense Refill   aspirin EC 81 MG tablet Take 1 tablet (81 mg total) by mouth daily. 90 tablet 1   atorvastatin (LIPITOR) 40 MG tablet TAKE 1 TABLET EVERY DAY 90 tablet 3   B Complex-C (B-COMPLEX  WITH VITAMIN C) tablet Take 1 tablet by mouth daily.     blood glucose meter kit and supplies Dispense based on patient and insurance preference. Use to check sugar once daily as directed. (FOR ICD-10 E10.9, E11.9). 1 each 0   Blood Glucose Monitoring Suppl DEVI 1 each by Does not apply route in the morning, at noon, and at bedtime. May substitute to any manufacturer covered by patient's insurance. 1 each 0   dapagliflozin propanediol (FARXIGA) 10 MG TABS tablet Take 1 tablet (10 mg total) by mouth daily. 90 tablet 3   glucose blood (ONETOUCH VERIO) test strip USE TO CHECK SUGAR ONCE DAILY E11.9 (VERIO PER PT) 100 strip 3   Lancets (ONETOUCH DELICA PLUS LANCET30G) MISC USE TO CHECK SUGAR ONCE DAILY E11.9 (DELICA PER PT) 100 each 3   levothyroxine (SYNTHROID) 88 MCG tablet TAKE 1 TABLET EVERY DAY BEFORE BREAKFAST 90 tablet 3   losartan (COZAAR) 50 MG tablet TAKE 1 TABLET AT BEDTIME 90 tablet 3   nicotine polacrilex (NICORETTE) 2 MG gum Take 2 mg by mouth as needed for smoking cessation.     nitroGLYCERIN (NITROSTAT) 0.4 MG SL tablet Place under the tongue.     omeprazole (PRILOSEC) 40 MG capsule TAKE 1 CAPSULE EVERY DAY 90 capsule 3   rOPINIRole (REQUIP) 0.25 MG tablet Take 1 tablet (0.25 mg total) by mouth at bedtime as needed. 90 tablet 1   traZODone (DESYREL) 100 MG tablet TAKE  1 AND 1/2 TABLETS AT BEDTIME 135 tablet 3   Turmeric 500 MG CAPS Take 2 capsules by mouth daily.     venlafaxine XR (EFFEXOR-XR) 150 MG 24 hr capsule TAKE 1 CAPSULE EVERY DAY 90 capsule 3   vitamin B-12 (CYANOCOBALAMIN) 1000 MCG tablet Take 1,000 mcg by mouth daily.     Vitamin D, Cholecalciferol, 25 MCG (1000 UT) TABS Take 1 tablet by mouth daily.     Continuous Glucose Receiver (FREESTYLE LIBRE 2 READER) DEVI Use to check blood sugars continuously. (Patient not taking: Reported on 08/09/2023) 1 each 0   Continuous Glucose Sensor (FREESTYLE LIBRE 2 SENSOR) MISC Apply 1 sensor to arm every two weeks (Patient not taking:  Reported on 08/09/2023) 2 each 2   No facility-administered medications prior to visit.    Review of Systems;  Patient denies headache, fevers, malaise, unintentional weight loss, skin rash, eye pain, sinus congestion and sinus pain, sore throat, dysphagia,  hemoptysis , cough, dyspnea, wheezing, chest pain, palpitations, orthopnea, edema, abdominal pain, nausea, melena, diarrhea, constipation, flank pain, dysuria, hematuria, urinary  Frequency, nocturia, numbness, tingling, seizures,  Focal weakness, Loss of consciousness,  Tremor, insomnia, depression, anxiety, and suicidal ideation.      Objective:  BP 124/78   Pulse 71   Ht 5\' 6"  (1.676 m)   Wt 170 lb 12.8 oz (77.5 kg)   SpO2 99%   BMI 27.57 kg/m   BP Readings from Last 3 Encounters:  08/09/23 124/78  04/04/23 115/63  01/09/23 94/60    Wt Readings from Last 3 Encounters:  08/09/23 170 lb 12.8 oz (77.5 kg)  04/11/23 156 lb (70.8 kg)  04/04/23 155 lb 3.2 oz (70.4 kg)    Physical Exam Vitals reviewed.  Constitutional:      General: She is not in acute distress.    Appearance: Normal appearance. She is normal weight. She is not ill-appearing, toxic-appearing or diaphoretic.  HENT:     Head: Normocephalic.  Eyes:     General: No scleral icterus.       Right eye: No discharge.        Left eye: No discharge.     Conjunctiva/sclera: Conjunctivae normal.  Cardiovascular:     Rate and Rhythm: Normal rate and regular rhythm.     Heart sounds: Normal heart sounds.  Pulmonary:     Effort: Pulmonary effort is normal. No respiratory distress.     Breath sounds: Normal breath sounds.  Musculoskeletal:        General: Normal range of motion.  Skin:    General: Skin is warm and dry.  Neurological:     General: No focal deficit present.     Mental Status: She is alert and oriented to person, place, and time. Mental status is at baseline.  Psychiatric:        Mood and Affect: Mood normal.        Behavior: Behavior normal.         Thought Content: Thought content normal.        Judgment: Judgment normal.    Lab Results  Component Value Date   HGBA1C 6.3 08/09/2023   HGBA1C 6.1 01/09/2023   HGBA1C 6.0 07/06/2022    Lab Results  Component Value Date   CREATININE 1.08 08/09/2023   CREATININE 1.14 01/09/2023   CREATININE 1.22 (H) 07/06/2022    Lab Results  Component Value Date   WBC 4.8 06/22/2021   HGB 13.1 06/22/2021   HCT 39.4 06/22/2021  PLT 246 06/22/2021   GLUCOSE 71 08/09/2023   CHOL 153 08/09/2023   TRIG 129.0 08/09/2023   HDL 68.30 08/09/2023   LDLDIRECT 66.0 08/09/2023   LDLCALC 58 08/09/2023   ALT 25 08/09/2023   AST 26 08/09/2023   NA 139 08/09/2023   K 3.9 08/09/2023   CL 99 08/09/2023   CREATININE 1.08 08/09/2023   BUN 18 08/09/2023   CO2 30 08/09/2023   TSH 2.26 08/09/2023   HGBA1C 6.3 08/09/2023   MICROALBUR <0.7 08/09/2023    No results found.  Assessment & Plan:  .Essential hypertension -     Microalbumin / creatinine urine ratio -     Comprehensive metabolic panel  Hyperlipidemia associated with type 2 diabetes mellitus (HCC) Assessment & Plan: Excellent  Control of cholesterol with atorvastatin  ,  Excellent  glycemic control on Farxiga alone ( metformin XR stopped due to diarrhea )/  Continue Statin and aspirin.  Continued reduced dose of  losartan  .   Lab Results  Component Value Date   HGBA1C 6.3 08/09/2023   Lab Results  Component Value Date   CHOL 153 08/09/2023   HDL 68.30 08/09/2023   LDLCALC 58 08/09/2023   LDLDIRECT 66.0 08/09/2023   TRIG 129.0 08/09/2023   CHOLHDL 2 08/09/2023     Orders: -     Lipid panel -     LDL cholesterol, direct  Type 2 diabetes mellitus with stage 2 chronic kidney disease, without long-term current use of insulin (HCC) Assessment & Plan: Currently well-controlled on  Farxiga  despite her weight gain.  Patient  has annual eye exam at [facility]. and foot exam is normal today. Patient has no microalbuminuria and  BP is  over controlled on 100 mg losartan  Patient is tolerating statin therapy for CAD risk reduction and on ACE/ARB for renal protection and hypertension   Lab Results  Component Value Date   HGBA1C 6.3 08/09/2023   Lab Results  Component Value Date   MICROALBUR <0.7 08/09/2023   MICROALBUR <0.7 07/06/2022       Orders: -     Microalbumin / creatinine urine ratio -     Hemoglobin A1c -     Comprehensive metabolic panel  Hypothyroidism due to acquired atrophy of thyroid Assessment & Plan: Thyroid function is WNL on current  88 mcg dose.  No current changes needed.   Lab Results  Component Value Date   TSH 2.26 08/09/2023     Orders: -     TSH  Cognitive complaints  B12 deficiency -     B12 and Folate Panel  Vitamin D deficiency -     VITAMIN D 25 Hydroxy (Vit-D Deficiency, Fractures)  Major depressive disorder, recurrent episode with melancholic features (HCC) Assessment & Plan: Symptoms are well controlled on  effexor at the reduced dose of 75 mg daily .    Her insomnia has improved with higher dose of trazodone.       I provided 30 minutes of face-to-face time during this encounter reviewing patient's last visit with me, patient's  ,recent surgical and non surgical procedures, previous  labs and imaging studies, counseling on dietary choices,  and post visit ordering to diagnostics and therapeutics .   Follow-up: Return in about 6 months (around 02/06/2024) for follow up diabetes, physical.   Sherlene Shams, MD

## 2023-08-10 LAB — COMPREHENSIVE METABOLIC PANEL
ALT: 25 U/L (ref 0–35)
AST: 26 U/L (ref 0–37)
Albumin: 4.2 g/dL (ref 3.5–5.2)
Alkaline Phosphatase: 38 U/L — ABNORMAL LOW (ref 39–117)
BUN: 18 mg/dL (ref 6–23)
CO2: 30 meq/L (ref 19–32)
Calcium: 9.7 mg/dL (ref 8.4–10.5)
Chloride: 99 meq/L (ref 96–112)
Creatinine, Ser: 1.08 mg/dL (ref 0.40–1.20)
GFR: 50.37 mL/min — ABNORMAL LOW (ref >60.00)
Glucose, Bld: 71 mg/dL (ref 70–99)
Potassium: 3.9 meq/L (ref 3.5–5.1)
Sodium: 139 meq/L (ref 135–145)
Total Bilirubin: 0.4 mg/dL (ref 0.2–1.2)
Total Protein: 7.1 g/dL (ref 6.0–8.3)

## 2023-08-10 LAB — B12 AND FOLATE PANEL
Folate: >25.2 ng/mL (ref >5.9)
Vitamin B-12: 1125 pg/mL — ABNORMAL HIGH (ref 211–911)

## 2023-08-10 LAB — VITAMIN D 25 HYDROXY (VIT D DEFICIENCY, FRACTURES): VITD: 46.99 ng/mL (ref 30.00–100.00)

## 2023-08-10 LAB — LDL CHOLESTEROL, DIRECT: Direct LDL: 66 mg/dL

## 2023-08-10 LAB — LIPID PANEL
Cholesterol: 153 mg/dL (ref 0–200)
HDL: 68.3 mg/dL (ref >39.00)
LDL Cholesterol: 58 mg/dL (ref 0–99)
NonHDL: 84.29
Total CHOL/HDL Ratio: 2
Triglycerides: 129 mg/dL (ref 0.0–149.0)
VLDL: 25.8 mg/dL (ref 0.0–40.0)

## 2023-08-10 LAB — TSH: TSH: 2.26 u[IU]/mL (ref 0.35–5.50)

## 2023-08-10 LAB — MICROALBUMIN / CREATININE URINE RATIO
Creatinine,U: 48.7 mg/dL
Microalb Creat Ratio: 1.4 mg/g (ref 0.0–30.0)
Microalb, Ur: <0.7 mg/dL (ref 0.0–1.9)

## 2023-08-10 LAB — HEMOGLOBIN A1C: Hgb A1c MFr Bld: 6.3 % (ref 4.6–6.5)

## 2023-08-10 NOTE — Assessment & Plan Note (Signed)
Currently well-controlled on  Farxiga  despite her weight gain.  Patient  has annual eye exam at [facility] [place]. and foot exam is normal today. Patient has no microalbuminuria and BP is  over controlled on 100 mg losartan  Patient is tolerating statin therapy for CAD risk reduction and on ACE/ARB for renal protection and hypertension   Lab Results  Component Value Date   HGBA1C 6.3 08/09/2023   Lab Results  Component Value Date   MICROALBUR <0.7 08/09/2023   MICROALBUR <0.7 07/06/2022

## 2023-08-10 NOTE — Assessment & Plan Note (Signed)
Symptoms are well controlled on  effexor at the reduced dose of 75 mg daily .    Her insomnia has improved with higher dose of trazodone.  ?

## 2023-08-10 NOTE — Assessment & Plan Note (Signed)
Thyroid function is WNL on current  88 mcg dose.  No current changes needed.   Lab Results  Component Value Date   TSH 2.26 08/09/2023

## 2023-08-10 NOTE — Assessment & Plan Note (Signed)
Excellent  Control of cholesterol with atorvastatin  ,  Excellent  glycemic control on Farxiga alone ( metformin XR stopped due to diarrhea )/  Continue Statin and aspirin.  Continued reduced dose of  losartan  .   Lab Results  Component Value Date   HGBA1C 6.3 08/09/2023   Lab Results  Component Value Date   CHOL 153 08/09/2023   HDL 68.30 08/09/2023   LDLCALC 58 08/09/2023   LDLDIRECT 66.0 08/09/2023   TRIG 129.0 08/09/2023   CHOLHDL 2 08/09/2023

## 2023-08-11 NOTE — Telephone Encounter (Signed)
Faxed to med assistance team

## 2023-08-12 ENCOUNTER — Encounter: Payer: Self-pay | Admitting: Internal Medicine

## 2023-09-14 DIAGNOSIS — Z860101 Personal history of adenomatous and serrated colon polyps: Secondary | ICD-10-CM | POA: Diagnosis not present

## 2023-09-14 DIAGNOSIS — Z79899 Other long term (current) drug therapy: Secondary | ICD-10-CM | POA: Diagnosis not present

## 2023-09-14 DIAGNOSIS — J410 Simple chronic bronchitis: Secondary | ICD-10-CM | POA: Diagnosis not present

## 2023-09-14 DIAGNOSIS — K921 Melena: Secondary | ICD-10-CM | POA: Diagnosis not present

## 2023-09-14 DIAGNOSIS — R1032 Left lower quadrant pain: Secondary | ICD-10-CM | POA: Diagnosis not present

## 2023-09-14 DIAGNOSIS — G8929 Other chronic pain: Secondary | ICD-10-CM | POA: Diagnosis not present

## 2023-09-14 DIAGNOSIS — F17200 Nicotine dependence, unspecified, uncomplicated: Secondary | ICD-10-CM | POA: Diagnosis not present

## 2023-09-14 DIAGNOSIS — E039 Hypothyroidism, unspecified: Secondary | ICD-10-CM | POA: Diagnosis not present

## 2023-09-14 DIAGNOSIS — K219 Gastro-esophageal reflux disease without esophagitis: Secondary | ICD-10-CM | POA: Diagnosis not present

## 2023-10-03 ENCOUNTER — Other Ambulatory Visit: Payer: Self-pay

## 2023-10-09 ENCOUNTER — Ambulatory Visit

## 2023-10-26 ENCOUNTER — Encounter: Payer: Self-pay | Admitting: Pharmacist

## 2023-10-26 ENCOUNTER — Other Ambulatory Visit: Payer: Self-pay

## 2023-10-26 DIAGNOSIS — E1122 Type 2 diabetes mellitus with diabetic chronic kidney disease: Secondary | ICD-10-CM

## 2023-10-26 MED ORDER — DAPAGLIFLOZIN PROPANEDIOL 10 MG PO TABS: 10.0000 mg | ORAL_TABLET | Freq: Every day | ORAL | 3 refills | Status: AC

## 2023-10-26 NOTE — Progress Notes (Signed)
 Patient Assistance Program (PAP) Application   Manufacturer: [employer] ([employer])  Medication(s): Farxiga 10 mg daily  Medication Assistance Team received notice that prescription refill is required.   Clinical Pool cc'd for eRx medication refill to [facility] pharmacy ([facility])

## 2023-10-26 NOTE — Progress Notes (Signed)
 Sent refill to [facility]

## 2023-11-14 DIAGNOSIS — I10 Essential (primary) hypertension: Secondary | ICD-10-CM | POA: Diagnosis not present

## 2023-11-14 DIAGNOSIS — N1831 Chronic kidney disease, stage 3a: Secondary | ICD-10-CM | POA: Diagnosis not present

## 2023-11-14 DIAGNOSIS — E1122 Type 2 diabetes mellitus with diabetic chronic kidney disease: Secondary | ICD-10-CM | POA: Diagnosis not present

## 2023-11-14 DIAGNOSIS — D631 Anemia in chronic kidney disease: Secondary | ICD-10-CM | POA: Diagnosis not present

## 2023-11-20 DIAGNOSIS — D631 Anemia in chronic kidney disease: Secondary | ICD-10-CM | POA: Diagnosis not present

## 2023-11-20 DIAGNOSIS — I10 Essential (primary) hypertension: Secondary | ICD-10-CM | POA: Diagnosis not present

## 2023-11-20 DIAGNOSIS — E1122 Type 2 diabetes mellitus with diabetic chronic kidney disease: Secondary | ICD-10-CM | POA: Diagnosis not present

## 2023-11-20 DIAGNOSIS — N1831 Chronic kidney disease, stage 3a: Secondary | ICD-10-CM | POA: Diagnosis not present

## 2024-01-08 ENCOUNTER — Telehealth: Payer: Self-pay

## 2024-01-08 NOTE — Telephone Encounter (Signed)
 Copied from CRM [id number]. Topic: Clinical - Medication Question >> Jan 08, 2024 12:01 PM Crystal Meyers wrote: Reason for CRM: Patient called in to ask Dr. Marylynn to Order diabetic supplies from [facility] .. glucose blood (ONETOUCH VERIO) test strip and the needle

## 2024-01-09 MED ORDER — ONETOUCH DELICA PLUS LANCET30G MISC: 3 refills | Status: DC

## 2024-01-09 MED ORDER — ONETOUCH VERIO VI STRP: ORAL_STRIP | 3 refills | Status: DC

## 2024-01-09 NOTE — Telephone Encounter (Signed)
 Diabetic supplies has been sent to pharmacy and pt is aware.

## 2024-01-09 NOTE — Addendum Note (Signed)
 Addended by: HARRIETTE RAISIN on: 01/09/2024 03:22 PM   Modules accepted: Orders

## 2024-01-10 ENCOUNTER — Other Ambulatory Visit (HOSPITAL_COMMUNITY): Payer: Self-pay

## 2024-01-10 ENCOUNTER — Other Ambulatory Visit: Payer: Self-pay | Admitting: Internal Medicine

## 2024-01-10 NOTE — Telephone Encounter (Unsigned)
 Copied from CRM [id number]. Topic: Clinical - Medication Refill >> Jan 10, 2024  1:42 PM Macario HERO wrote: Medication: blood glucose meter kit and supplies [[id number]]  Has the patient contacted their pharmacy? No (Agent: If no, request that the patient contact the pharmacy for the refill. If patient does not wish to contact the pharmacy document the reason why and proceed with request.) (Agent: If yes, when and what did the pharmacy advise?)  This is the patient's preferred pharmacy:  [id number] [place], [place] - [address] [place] [address] Phone: [phone] Fax: [phone]   Is this the correct pharmacy for this prescription? Yes If no, delete pharmacy and type the correct one.   Has the prescription been filled recently? Yes  Is the patient out of the medication? Yes  Has the patient been seen for an appointment in the last year OR does the patient have an upcoming appointment? Yes  Can we respond through MyChart? No  Agent: Please be advised that Rx refills may take up to 3 business days. We ask that you follow-up with your pharmacy.

## 2024-01-11 ENCOUNTER — Other Ambulatory Visit (HOSPITAL_COMMUNITY): Payer: Self-pay

## 2024-01-11 ENCOUNTER — Telehealth: Payer: Self-pay

## 2024-01-11 ENCOUNTER — Other Ambulatory Visit: Payer: Self-pay

## 2024-01-11 DIAGNOSIS — E1122 Type 2 diabetes mellitus with diabetic chronic kidney disease: Secondary | ICD-10-CM

## 2024-01-11 MED ORDER — BLOOD GLUCOSE METER KIT: PACK | 0 refills | Status: DC

## 2024-01-11 MED ORDER — BLOOD GLUCOSE MONITORING SUPPL DEVI: 1.0000 | Freq: Three times a day (TID) | 0 refills | Status: AC

## 2024-01-11 MED ORDER — BLOOD GLUCOSE TEST VI STRP: 1.0000 | ORAL_STRIP | Freq: Three times a day (TID) | 0 refills | Status: AC

## 2024-01-11 MED ORDER — LANCETS MISC. MISC: 1.0000 | Freq: Three times a day (TID) | 0 refills | Status: AC

## 2024-01-11 MED ORDER — LANCET DEVICE MISC: 1.0000 | Freq: Three times a day (TID) | 0 refills | Status: AC

## 2024-01-11 NOTE — Telephone Encounter (Signed)
 Pt's insurance on longer covers the one touch and prefers the accu check. I have resent the the meter and supplies to be filled as the accu check brand.

## 2024-01-11 NOTE — Telephone Encounter (Signed)
 Pharmacy Patient Advocate Encounter   Received notification from Onbase that prior authorization for OneTouch Verio Flex System w/Device kit is required/requested.   Insurance verification completed.   The patient is insured through [place] .   Per test claim: Insurance prefers ACCU-CHECK products or TRUE [employer]RI[employer]

## 2024-01-20 ENCOUNTER — Other Ambulatory Visit: Payer: Self-pay | Admitting: Internal Medicine

## 2024-01-23 NOTE — Telephone Encounter (Signed)
 Called pt and she stated that the pharmacy sent the refill request because she picked up the last refill. Pt does have enough until she come to her appointment.

## 2024-02-06 ENCOUNTER — Encounter: Payer: Self-pay | Admitting: Internal Medicine

## 2024-02-06 ENCOUNTER — Ambulatory Visit (INDEPENDENT_AMBULATORY_CARE_PROVIDER_SITE_OTHER): Payer: Medicare HMO | Admitting: Internal Medicine

## 2024-02-06 VITALS — BP 102/62 | HR 78 | Temp 98.0°F | Ht 66.0 in | Wt 166.8 lb

## 2024-02-06 DIAGNOSIS — E1169 Type 2 diabetes mellitus with other specified complication: Secondary | ICD-10-CM | POA: Diagnosis not present

## 2024-02-06 DIAGNOSIS — D509 Iron deficiency anemia, unspecified: Secondary | ICD-10-CM

## 2024-02-06 DIAGNOSIS — L603 Nail dystrophy: Secondary | ICD-10-CM | POA: Insufficient documentation

## 2024-02-06 DIAGNOSIS — L658 Other specified nonscarring hair loss: Secondary | ICD-10-CM | POA: Diagnosis not present

## 2024-02-06 DIAGNOSIS — E1122 Type 2 diabetes mellitus with diabetic chronic kidney disease: Secondary | ICD-10-CM | POA: Diagnosis not present

## 2024-02-06 DIAGNOSIS — I10 Essential (primary) hypertension: Secondary | ICD-10-CM

## 2024-02-06 DIAGNOSIS — L03032 Cellulitis of left toe: Secondary | ICD-10-CM | POA: Diagnosis not present

## 2024-02-06 DIAGNOSIS — L659 Nonscarring hair loss, unspecified: Secondary | ICD-10-CM | POA: Diagnosis not present

## 2024-02-06 DIAGNOSIS — Z7984 Long term (current) use of oral hypoglycemic drugs: Secondary | ICD-10-CM

## 2024-02-06 DIAGNOSIS — E785 Hyperlipidemia, unspecified: Secondary | ICD-10-CM

## 2024-02-06 DIAGNOSIS — N182 Chronic kidney disease, stage 2 (mild): Secondary | ICD-10-CM

## 2024-02-06 DIAGNOSIS — N1832 Chronic kidney disease, stage 3b: Secondary | ICD-10-CM

## 2024-02-06 DIAGNOSIS — E039 Hypothyroidism, unspecified: Secondary | ICD-10-CM | POA: Diagnosis not present

## 2024-02-06 DIAGNOSIS — D631 Anemia in chronic kidney disease: Secondary | ICD-10-CM

## 2024-02-06 LAB — COMPREHENSIVE METABOLIC PANEL WITH GFR
ALT: 18 U/L (ref 0–35)
AST: 21 U/L (ref 0–37)
Albumin: 4.2 g/dL (ref 3.5–5.2)
Alkaline Phosphatase: 37 U/L — ABNORMAL LOW (ref 39–117)
BUN: 20 mg/dL (ref 6–23)
CO2: 30 meq/L (ref 19–32)
Calcium: 9.4 mg/dL (ref 8.4–10.5)
Chloride: 102 meq/L (ref 96–112)
Creatinine, Ser: 1.17 mg/dL (ref 0.40–1.20)
GFR: 45.6 mL/min — ABNORMAL LOW (ref >60.00)
Glucose, Bld: 94 mg/dL (ref 70–99)
Potassium: 4.1 meq/L (ref 3.5–5.1)
Sodium: 141 meq/L (ref 135–145)
Total Bilirubin: 0.3 mg/dL (ref 0.2–1.2)
Total Protein: 6.6 g/dL (ref 6.0–8.3)

## 2024-02-06 LAB — LIPID PANEL
Cholesterol: 148 mg/dL (ref 0–200)
HDL: 60.1 mg/dL (ref >39.00)
LDL Cholesterol: 69 mg/dL (ref 0–99)
NonHDL: 87.65
Total CHOL/HDL Ratio: 2
Triglycerides: 95 mg/dL (ref 0.0–149.0)
VLDL: 19 mg/dL (ref 0.0–40.0)

## 2024-02-06 LAB — CBC WITH DIFFERENTIAL/PLATELET
Basophils Absolute: 0 K/uL (ref 0.0–0.1)
Basophils Relative: 0.8 % (ref 0.0–3.0)
Eosinophils Absolute: 0.1 K/uL (ref 0.0–0.7)
Eosinophils Relative: 2.5 % (ref 0.0–5.0)
HCT: 35.7 % — ABNORMAL LOW (ref 36.0–46.0)
Hemoglobin: 11.7 g/dL — ABNORMAL LOW (ref 12.0–15.0)
Lymphocytes Relative: 36.1 % (ref 12.0–46.0)
Lymphs Abs: 1.7 K/uL (ref 0.7–4.0)
MCHC: 32.7 g/dL (ref 30.0–36.0)
MCV: 85.1 fl (ref 78.0–100.0)
Monocytes Absolute: 0.4 K/uL (ref 0.1–1.0)
Monocytes Relative: 7.8 % (ref 3.0–12.0)
Neutro Abs: 2.6 K/uL (ref 1.4–7.7)
Neutrophils Relative %: 52.8 % (ref 43.0–77.0)
Platelets: 240 K/uL (ref 150.0–400.0)
RBC: 4.19 Mil/uL (ref 3.87–5.11)
RDW: 14.4 % (ref 11.5–15.5)
WBC: 4.8 K/uL (ref 4.0–10.5)

## 2024-02-06 LAB — MICROALBUMIN / CREATININE URINE RATIO
Creatinine,U: 88.2 mg/dL
Microalb Creat Ratio: UNDETERMINED mg/g (ref 0.0–30.0)
Microalb, Ur: <0.7 mg/dL

## 2024-02-06 LAB — IBC + FERRITIN
Ferritin: 3.9 ng/mL — ABNORMAL LOW (ref 10.0–291.0)
Iron: 33 ug/dL — ABNORMAL LOW (ref 42–145)
Saturation Ratios: 8.9 % — ABNORMAL LOW (ref 20.0–50.0)
TIBC: 369.6 ug/dL (ref 250.0–450.0)
Transferrin: 264 mg/dL (ref 212.0–360.0)

## 2024-02-06 LAB — TSH: TSH: 3.35 u[IU]/mL (ref 0.35–5.50)

## 2024-02-06 LAB — HEMOGLOBIN A1C: Hgb A1c MFr Bld: 6.3 % (ref 4.6–6.5)

## 2024-02-06 LAB — LDL CHOLESTEROL, DIRECT: Direct LDL: 62 mg/dL

## 2024-02-06 MED ORDER — ROPINIROLE HCL 0.25 MG PO TABS: 0.2500 mg | ORAL_TABLET | Freq: Every evening | ORAL | 1 refills | Status: AC | PRN

## 2024-02-06 MED ORDER — SPIRONOLACTONE 50 MG PO TABS: 50.0000 mg | ORAL_TABLET | Freq: Every day | ORAL | 1 refills | Status: AC

## 2024-02-06 MED ORDER — CEPHALEXIN 500 MG PO CAPS: 500.0000 mg | ORAL_CAPSULE | Freq: Four times a day (QID) | ORAL | 0 refills | Status: AC

## 2024-02-06 NOTE — Assessment & Plan Note (Signed)
 Testosterone  level ordered.  Starting spironolactone  at 50 mg dose.  Resume topical minoxidil

## 2024-02-06 NOTE — Assessment & Plan Note (Addendum)
 Noted in [facility] 2024, hgb 11.5  with FOBT positivity leading to  EGD/colonoscopy done ([place])  no source found Sept 2024.  Last hgb was normal in May 2025 by Nephrology.  Needs to resume iron   based on repeat levels  Lab Results  Component Value Date   IRON  71 01/09/2023   TIBC 403.2 01/09/2023   FERRITIN 4.7 (L) 01/09/2023   Lab Results  Component Value Date   WBC 4.8 06/22/2021   HGB 13.1 06/22/2021   HCT 39.4 06/22/2021   MCV 88.1 06/22/2021   PLT 246 06/22/2021

## 2024-02-06 NOTE — Assessment & Plan Note (Signed)
 She remains well under goal I reduced dose of losartan  but does not check at home despite being a retired [employer].  Advised to monitor home BP with start of spironolactone  (for female pattern hair loss) ad reduce losartan  dose to 25 mg for BP < 100/70

## 2024-02-06 NOTE — Assessment & Plan Note (Signed)
 Chronic,  no improvement with unknown topical antifungal prescribed empirically  by podiatrist Loreda.   History of rash in reaction to lamisil.  No response to tea tee oil, etc.  She has a remote history of trauma to nail bed  from parrot bite . Referring to [facility] for formal evaluation

## 2024-02-06 NOTE — Patient Instructions (Addendum)
 Resume minoxidil topical for hair  loss.  IT TAKES TIME!  YOU NEED AT LEAST 3 MONTHS before we change to orla minoxidil which may lower your blood pressure   Please start taking spironolactone    the starting dose 50 mg   increase to 100 mg after one week if tolerated with out fatigue, nausea.   You will need to monitor your blood pressure at home  and reduce losartan  or stop it completely if BP drops below 100/60

## 2024-02-06 NOTE — Assessment & Plan Note (Addendum)
 Cephalexin  500 mg  qid prescribed

## 2024-02-06 NOTE — Assessment & Plan Note (Signed)
 Excellent  Control of cholesterol with atorvastatin    .   Lab Results  Component Value Date   HGBA1C 6.3 08/09/2023   Lab Results  Component Value Date   CHOL 153 08/09/2023   HDL 68.30 08/09/2023   LDLCALC 58 08/09/2023   LDLDIRECT 66.0 08/09/2023   TRIG 129.0 08/09/2023   CHOLHDL 2 08/09/2023

## 2024-02-06 NOTE — Progress Notes (Signed)
 Subjective:  Patient ID: Crystal Meyers, female    DOB: 24-Jan-1946  Age: 78 y.o. MRN: [id number]  CC: The primary encounter diagnosis was Essential hypertension. 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   HPI [name] presents for  Chief Complaint  Patient presents with   Medical Management of Chronic Issues    6 mo F/U    1) T2DM:  She  feels generally well,  But is not  exercising regularly or trying to lose weight. Not Checking  blood sugars .  Recently acquird a meter   Denies any recent hypoglyemic events.  Taking  Farxiga , losartan  and atorvastatin  daily  as directed. Following a carbohydrate modified diet 6 days per week. Denies numbness, burning and tingling of extremities. Appetite is good.    2) Hair loss :  ongoing. Used topical minoxidil only for one month . Does not see a hair dressor   3( left great toe onychomycosis: has seen 2 podiatrists none in the past several years. ,  but getting worse.  Toenail removal advocated by #! Podiatry Dr Loreda after the  fungal cream was prescribed with no change.  Saw   #2 podiatrist who disagreed with removal of toe but never followed up with [facility]  about the clippings for toenail fungus .[name] Has been using [place] salve for over a year off and on as directed with no change .  Soaks it once a weeka in epsom salts.  Toenail cuticle is red   Outpatient Medications Prior to Visit  Medication Sig Dispense Refill   aspirin  EC 81 MG tablet Take 1 tablet (81 mg total) by mouth daily. 90 tablet 1   atorvastatin  (LIPITOR) 40 MG tablet TAKE 1 TABLET EVERY DAY 90 tablet 3   B Complex-C (B-COMPLEX  WITH VITAMIN C) tablet Take 1 tablet by mouth daily.     Blood Glucose Monitoring Suppl DEVI 1 each by Does not apply route in the morning, at noon, and at bedtime. May substitute to any manufacturer covered by patient's insurance. 1 each 0   Blood Glucose Monitoring Suppl DEVI 1 each by Does not apply route in the morning, at noon, and at bedtime. May substitute to any manufacturer covered by patient's insurance. 1 each 0   dapagliflozin  propanediol (FARXIGA ) 10 MG TABS tablet Take 1 tablet (10 mg total) by mouth daily. 90 tablet 3   Glucose Blood (BLOOD GLUCOSE TEST STRIPS) STRP 1 each by In Vitro route in the morning, at noon, and at bedtime. May substitute to any manufacturer covered by patient's insurance. 300 strip 0   glucose blood (ONETOUCH VERIO) test strip USE TO CHECK SUGAR ONCE DAILY E11.9 (VERIO PER PT) 100 strip 3   Lancet Device MISC 1 each by Does not apply route in the morning, at noon, and at bedtime. May substitute to any manufacturer covered by patient's insurance. 1 each 0   Lancets (ONETOUCH DELICA PLUS LANCET30G) MISC USE TO CHECK SUGAR ONCE DAILY E11.9 (DELICA PER PT) 100 each 3   Lancets Misc. MISC 1 each by Does not apply route in the morning, at noon, and at bedtime. May substitute to any manufacturer covered by patient's insurance. 300 each 0  levothyroxine  (SYNTHROID ) 88 MCG tablet TAKE 1 TABLET EVERY DAY BEFORE BREAKFAST 90 tablet 3   losartan  (COZAAR ) 50 MG tablet TAKE 1 TABLET AT BEDTIME 90 tablet 3   nicotine  polacrilex (NICORETTE ) 2 MG gum Take 2 mg by mouth as needed for smoking cessation.     omeprazole  (PRILOSEC) 40 MG capsule TAKE 1 CAPSULE EVERY DAY 90 capsule 3   traZODone  (DESYREL ) 100 MG tablet TAKE 1 AND 1/2 TABLETS AT BEDTIME 135 tablet 3   Turmeric 500 MG CAPS Take 2 capsules by mouth daily.     venlafaxine  XR (EFFEXOR -XR) 150 MG 24 hr capsule TAKE 1 CAPSULE EVERY DAY 90 capsule 3   vitamin B-12 (CYANOCOBALAMIN ) 1000 MCG tablet Take 1,000 mcg by mouth  daily.     Vitamin D , Cholecalciferol, 25 MCG (1000 UT) TABS Take 1 tablet by mouth daily.     rOPINIRole  (REQUIP ) 0.25 MG tablet Take 1 tablet (0.25 mg total) by mouth at bedtime as needed. 90 tablet 1   nitroGLYCERIN  (NITROSTAT ) 0.4 MG SL tablet Place under the tongue. (Patient not taking: Reported on 02/06/2024)     No facility-administered medications prior to visit.    Review of Systems;  Patient denies headache, fevers, malaise, unintentional weight loss, skin rash, eye pain, sinus congestion and sinus pain, sore throat, dysphagia,  hemoptysis , cough, dyspnea, wheezing, chest pain, palpitations, orthopnea, edema, abdominal pain, nausea, melena, diarrhea, constipation, flank pain, dysuria, hematuria, urinary  Frequency, nocturia, numbness, tingling, seizures,  Focal weakness, Loss of consciousness,  Tremor, insomnia, depression, anxiety, and suicidal ideation.      Objective:  BP 102/62 (BP Location: Right Arm, Patient Position: Sitting, Cuff Size: Normal)   Pulse 78   Temp 98 F (36.7 C) (Oral)   Ht 5' 6 (1.676 m)   Wt 166 lb 12.8 oz (75.7 kg)   SpO2 96%   BMI 26.92 kg/m   BP Readings from Last 3 Encounters:  02/06/24 102/62  08/09/23 124/78  04/04/23 115/63    Wt Readings from Last 3 Encounters:  02/06/24 166 lb 12.8 oz (75.7 kg)  08/09/23 170 lb 12.8 oz (77.5 kg)  04/11/23 156 lb (70.8 kg)    Physical Exam Vitals reviewed.  Constitutional:      General: She is not in acute distress.    Appearance: Normal appearance. She is normal weight. She is not ill-appearing, toxic-appearing or diaphoretic.  HENT:     Head: Normocephalic.  Eyes:     General: No scleral icterus.       Right eye: No discharge.        Left eye: No discharge.     Conjunctiva/sclera: Conjunctivae normal.  Cardiovascular:     Rate and Rhythm: Normal rate and regular rhythm.     Heart sounds: Normal heart sounds.  Pulmonary:     Effort: Pulmonary effort is normal. No respiratory distress.      Breath sounds: Normal breath sounds.  Musculoskeletal:        General: Normal range of motion.       Feet:  Skin:    General: Skin is warm and dry.  Neurological:     General: No focal deficit present.     Mental Status: She is alert and oriented to person, place, and time. Mental status is at baseline.  Psychiatric:        Mood and Affect: Mood normal.        Behavior: Behavior normal.        Thought Content:  Thought content normal.        Judgment: Judgment normal.     Lab Results  Component Value Date   HGBA1C 6.3 08/09/2023   HGBA1C 6.1 01/09/2023   HGBA1C 6.0 07/06/2022    Lab Results  Component Value Date   CREATININE 1.08 08/09/2023   CREATININE 1.14 01/09/2023   CREATININE 1.22 (H) 07/06/2022    Lab Results  Component Value Date   WBC 4.8 06/22/2021   HGB 13.1 06/22/2021   HCT 39.4 06/22/2021   PLT 246 06/22/2021   GLUCOSE 71 08/09/2023   CHOL 153 08/09/2023   TRIG 129.0 08/09/2023   HDL 68.30 08/09/2023   LDLDIRECT 66.0 08/09/2023   LDLCALC 58 08/09/2023   ALT 25 08/09/2023   AST 26 08/09/2023   NA 139 08/09/2023   K 3.9 08/09/2023   CL 99 08/09/2023   CREATININE 1.08 08/09/2023   BUN 18 08/09/2023   CO2 30 08/09/2023   TSH 2.26 08/09/2023   HGBA1C 6.3 08/09/2023    No results found.  Assessment & Plan:  .Essential hypertension Assessment & Plan: She remains well under goal I reduced dose of losartan  but does not check at home despite being a retired [employer] fund[employer].  Advised to monitor home BP with start of spironolactone  (for female pattern hair loss) ad reduce losartan  dose to 25 mg for BP < 100/70  Orders: -     Microalbumin / creatinine urine ratio -     Comprehensive metabolic panel with GFR  Hyperlipidemia associated with type 2 diabetes mellitus (HCC) Assessment & Plan: Excellent  Control of cholesterol with atorvastatin    .   Lab Results  Component Value Date   HGBA1C 6.3 08/09/2023   Lab Results  Component Value Date   CHOL 153  08/09/2023   HDL 68.30 08/09/2023   LDLCALC 58 08/09/2023   LDLDIRECT 66.0 08/09/2023   TRIG 129.0 08/09/2023   CHOLHDL 2 08/09/2023     Orders: -     Lipid panel -     LDL cholesterol, direct  Type 2 diabetes mellitus with stage 2 chronic kidney disease, without long-term current use of insulin (HCC) Assessment & Plan: Currently managed   with  Farxiga    and losartan  Patient  has annual eye exam at [facility]. and foot exam is normal today except for dystrophic great toenail on left foot.  Patient has no history of  microalbuminuria and BP is  well under goal on 50 mg  mg losartan   Patient is tolerating statin therapy for CAD risk reduction and on ACE/ARB for renal protection and hypertension   Lab Results  Component Value Date   HGBA1C 6.3 08/09/2023   No results found for: LABMICR, MICROALBUR      Orders: -     Hemoglobin A1c -     Microalbumin / creatinine urine ratio -     Comprehensive metabolic panel with GFR  Acquired hypothyroidism -     TSH  Hair loss -     Testosterone ,Free and Total  Dystrophic nail Assessment & Plan: Chronic,  no improvement with unknown topical antifungal prescribed empirically  by podiatrist Loreda.   History of rash in reaction to lamisil.  No response to tea tee oil, etc.  She has a remote history of trauma to nail bed  from parrot bite . Referring to [facility] for formal evaluation   Orders: -     Ambulatory referral to Podiatry  Iron deficiency anemia, unspecified iron deficiency anemia type  Assessment & Plan: Noted in Mary 2024, hgb 11.5  with FOBT positivity leading to  EGD/colonoscopy done ([place])  no source found Sept 2024.  Last hgb was normal in May 2025 by Nephrology.  No longer taking iron.  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  Starting spironolactone  at 50 mg dose.  Resume topical minoxidil    Paronychia of great toe, left Assessment & Plan: Cephalexin  500 mg  qid prescribed    Other orders -     rOPINIRole  HCl; Take 1 tablet (0.25 mg total) by mouth at bedtime as needed.  Dispense: 90 tablet; Refill: 1 -     Spironolactone ; Take 1 tablet (50 mg total) by mouth daily.  Dispense: 90 tablet; Refill: 1 -     Cephalexin ; Take 1 capsule (500 mg total) by mouth 4 (four) times daily.  Dispense: 28 capsule; Refill: 0     I spent 34 minutes on the day of this face to face encounter reviewing patient's  most recent visit with gastroenterology,  nephrology,  prior relevant surgical and non surgical procedures, recent  labs and imaging studies, counseling on diabetes and hypertension  management,  reviewing the assessment and plan with patient, and post visit ordering and reviewing of  diagnostics and therapeutics with patient  .   Follow-up: No follow-ups on file.   Verneita LITTIE Kettering, MD

## 2024-02-06 NOTE — Addendum Note (Signed)
 Addended by: TANDA HARVEY D on: 02/06/2024 01:18 PM   Modules accepted: Orders

## 2024-02-06 NOTE — Assessment & Plan Note (Addendum)
 Currently managed   with  Farxiga    and losartan  Patient  has annual eye exam at [facility]. and foot exam is normal today except for dystrophic great toenail on left foot.  Patient has no history of  microalbuminuria and BP is  well under goal on 50 mg  mg losartan   Patient is tolerating statin therapy for CAD risk reduction and on ACE/ARB for renal protection and hypertension   Lab Results  Component Value Date   HGBA1C 6.3 08/09/2023   No results found for: LABMICR, MICROALBUR

## 2024-02-07 ENCOUNTER — Other Ambulatory Visit (HOSPITAL_COMMUNITY): Payer: Self-pay

## 2024-02-08 LAB — TESTOSTERONE,FREE AND TOTAL
Testosterone, Free: 0.3 pg/mL (ref 0.0–4.2)
Testosterone: 5 ng/dL (ref 3–67)

## 2024-02-10 ENCOUNTER — Ambulatory Visit: Payer: Self-pay | Admitting: Internal Medicine

## 2024-02-10 DIAGNOSIS — D631 Anemia in chronic kidney disease: Secondary | ICD-10-CM | POA: Insufficient documentation

## 2024-02-10 MED ORDER — IRON (FERROUS SULFATE) 325 (65 FE) MG PO TABS: 325.0000 mg | ORAL_TABLET | Freq: Every day | ORAL | 1 refills | Status: AC

## 2024-02-10 NOTE — Addendum Note (Signed)
 Addended by: MARYLYNN VERNEITA CROME on: 02/10/2024 09:57 AM   Modules accepted: Orders

## 2024-02-12 ENCOUNTER — Ambulatory Visit: Admitting: Podiatry

## 2024-02-13 NOTE — Progress Notes (Signed)
 Patient notified and scheduled for lab appt on 03/26/24 at 11:15 am. Verbalized understanding.

## 2024-02-14 ENCOUNTER — Ambulatory Visit: Admitting: Podiatry

## 2024-02-14 VITALS — Ht 66.0 in | Wt 166.8 lb

## 2024-02-14 DIAGNOSIS — B351 Tinea unguium: Secondary | ICD-10-CM | POA: Diagnosis not present

## 2024-02-14 MED ORDER — ITRACONAZOLE 100 MG PO CAPS: ORAL_CAPSULE | ORAL | 0 refills | Status: DC

## 2024-02-14 NOTE — Patient Instructions (Signed)
  VISIT SUMMARY: Today, we discussed your worsening toenail and skin issues, which have been ongoing for several years. We reviewed your previous treatments and allergies, and we have decided on a new treatment plan to address your concerns.  YOUR PLAN: -ONYCHOMYCOSIS OF BILATERAL HALLUCES: Onychomycosis is a fungal infection of the toenails. We have chosen to start you on itraconazole  (Sporanox ) due to its effectiveness and your recent normal kidney and liver function tests. You will take this medication twice a day for one week, then stop for three weeks, and repeat this cycle two more times. We will monitor your kidney and liver function with a comprehensive metabolic panel in six weeks. If there is no improvement after this treatment, we may consider toenail removal.    INSTRUCTIONS: Please take itraconazole  (Sporanox ) as prescribed: twice a day for one week, then stop for three weeks, and repeat this cycle two more times. Schedule a comprehensive metabolic panel around September 15th at [facility] to monitor your kidney and liver function. We will have a follow-up appointment in four months to assess the effectiveness of the treatment. If there is no improvement, we may consider toenail removal.                      Contains text generated by Abridge.                                 Contains text generated by Abridge.

## 2024-02-16 NOTE — Progress Notes (Signed)
 Subjective:  Patient ID: Crystal Meyers, female    DOB: June 18, 1946,  MRN: [id number]  Chief Complaint  Patient presents with   Nail Problem    RM 4 Patient is here by referral for dystrophic nail-Dr. Valri Kettering refer. Discoloration of the right and left hallus, with thickening of the left hallux.     Discussed the use of AI scribe software for clinical note transcription with the patient, who gave verbal consent to proceed.  History of Present Illness Crystal Meyers is a 78 year old female who presents with worsening toenail and skin issues.  She has been experiencing ongoing issues with her toenails, which have progressively worsened over time. The condition began several years ago after a bird bit her toe, causing a crack down the middle of the toenail. Since then, the condition has spread to another toe.  The skin around the affected toenail sometimes breaks open and becomes deeply cracked. She has tried topical creams in the past, which were not effective in alleviating her symptoms.  She has a known allergy to Lamisil, which caused a rash, but has not tried other oral antifungal medications such as fluconazole or itraconazole . She is concerned about potential effects on her kidneys due to her existing kidney disease.  The condition is impacting her ability to wear sandals, which she misses being able to do.      Objective:    Physical Exam VASCULAR: DP and PT pulse palpable. Foot is warm and well-perfused. Capillary fill time is brisk. DERMATOLOGIC: Normal skin turgor, texture, and temperature. No open lesions, rashes, or ulcerations. Onychomycosis of the entire left hallux and distal lateral quadrant of the right hallux. NEUROLOGIC: Normal sensation to light touch and pressure. No paresthesias on examination. ORTHOPEDIC: Smooth, pain-free range of motion of all examined joints. No ecchymosis or bruising. No gross deformity. No pain to palpation.        Results LABS    Serum creatinine: Normal (02/05/2022)   GFR: 45.6 mL/min/1.73 m (02/05/2022)   ALT: Normal (02/05/2022)   AST: Normal (02/05/2022)   Total protein: Normal (02/05/2022)   Assessment:   1. Onychomycosis      Plan:  Patient was evaluated and treated and all questions answered.  Assessment and Plan Assessment & Plan Onychomycosis of bilateral halluces Chronic onychomycosis affecting the entirety of the left hallux and the distal lateral quadrant of the right hallux. Previous topical treatments were ineffective. Oral antifungal treatment options were discussed, considering her allergy to Lamisil. Itraconazole  (Sporanox ) was chosen due to its effectiveness and her recent normal kidney and liver function tests. Risks of allergic reactions and the need for monitoring kidney and liver function were discussed. Laser treatment and toenail removal were considered but not preferred by her. Laser treatment is not covered by insurance and costs $100 per foot per session, with 5-10 sessions typically required. Toenail removal may not result in normal regrowth. - Prescribe itraconazole  (Sporanox ) with pulse dosing: take twice a day for one week, then do not take for three weeks, and repeat this course two more times. - Order comprehensive metabolic panel in six weeks around September 15th at [facility]. - Schedule follow-up appointment in four months to assess treatment efficacy. - Consider toenail removal if no improvement after itraconazole  treatment.  Chronic kidney disease, stage 3 Chronic kidney disease stage 3 with recent lab work showing normal serum creatinine and GFR of 45.6. Itraconazole  dosing will be monitored to ensure safety given her kidney function. - Monitor kidney  function with comprehensive metabolic panel in six weeks.      Return in about 4 months (around 06/15/2024) for follow up after nail fungus treatment.

## 2024-02-26 ENCOUNTER — Telehealth: Payer: Self-pay

## 2024-02-26 ENCOUNTER — Ambulatory Visit: Payer: Self-pay

## 2024-02-26 ENCOUNTER — Other Ambulatory Visit: Payer: Self-pay

## 2024-02-26 ENCOUNTER — Emergency Department
Admission: EM | Admit: 2024-02-26 | Discharge: 2024-02-26 | Disposition: A | Discharge status: Home / Self Care | Attending: Emergency Medicine | Admitting: Emergency Medicine

## 2024-02-26 ENCOUNTER — Emergency Department

## 2024-02-26 DIAGNOSIS — E860 Dehydration: Secondary | ICD-10-CM | POA: Diagnosis not present

## 2024-02-26 DIAGNOSIS — I6782 Cerebral ischemia: Secondary | ICD-10-CM | POA: Diagnosis not present

## 2024-02-26 DIAGNOSIS — I2510 Atherosclerotic heart disease of native coronary artery without angina pectoris: Secondary | ICD-10-CM | POA: Diagnosis not present

## 2024-02-26 DIAGNOSIS — R252 Cramp and spasm: Secondary | ICD-10-CM | POA: Insufficient documentation

## 2024-02-26 DIAGNOSIS — J449 Chronic obstructive pulmonary disease, unspecified: Secondary | ICD-10-CM | POA: Insufficient documentation

## 2024-02-26 DIAGNOSIS — R42 Dizziness and giddiness: Secondary | ICD-10-CM

## 2024-02-26 DIAGNOSIS — T500X5A Adverse effect of mineralocorticoids and their antagonists, initial encounter: Secondary | ICD-10-CM | POA: Insufficient documentation

## 2024-02-26 DIAGNOSIS — I10 Essential (primary) hypertension: Secondary | ICD-10-CM | POA: Diagnosis not present

## 2024-02-26 DIAGNOSIS — T50905A Adverse effect of unspecified drugs, medicaments and biological substances, initial encounter: Secondary | ICD-10-CM

## 2024-02-26 LAB — COMPREHENSIVE METABOLIC PANEL WITH GFR
ALT: 26 U/L (ref 0–44)
AST: 34 U/L (ref 15–41)
Albumin: 4.6 g/dL (ref 3.5–5.0)
Alkaline Phosphatase: 50 U/L (ref 38–126)
Anion gap: 12 (ref 5–15)
BUN: 28 mg/dL — ABNORMAL HIGH (ref 8–23)
CO2: 22 mmol/L (ref 22–32)
Calcium: 10.1 mg/dL (ref 8.9–10.3)
Chloride: 100 mmol/L (ref 98–111)
Creatinine, Ser: 1.39 mg/dL — ABNORMAL HIGH (ref 0.44–1.00)
GFR, Estimated: 40 mL/min — ABNORMAL LOW (ref >60)
Glucose, Bld: 129 mg/dL — ABNORMAL HIGH (ref 70–99)
Potassium: 4.7 mmol/L (ref 3.5–5.1)
Sodium: 134 mmol/L — ABNORMAL LOW (ref 135–145)
Total Bilirubin: 0.7 mg/dL (ref 0.0–1.2)
Total Protein: 8.3 g/dL — ABNORMAL HIGH (ref 6.5–8.1)

## 2024-02-26 LAB — CBC
HCT: 44 % (ref 36.0–46.0)
Hemoglobin: 15.2 g/dL — ABNORMAL HIGH (ref 12.0–15.0)
MCH: 28.3 pg (ref 26.0–34.0)
MCHC: 34.5 g/dL (ref 30.0–36.0)
MCV: 81.8 fL (ref 80.0–100.0)
Platelets: 334 K/uL (ref 150–400)
RBC: 5.38 MIL/uL — ABNORMAL HIGH (ref 3.87–5.11)
RDW: 14 % (ref 11.5–15.5)
WBC: 9.3 K/uL (ref 4.0–10.5)
nRBC: 0 % (ref 0.0–0.2)

## 2024-02-26 MED ORDER — SODIUM CHLORIDE 0.9 % IV SOLN: Freq: Once | INTRAVENOUS | Status: AC

## 2024-02-26 NOTE — Telephone Encounter (Signed)
 Triage nurse, Damien, called back to state patient refused 911 and ED recommendation.  Damien states patient states she believes this situation is due to her medications, and she would like to speak with someone from our clinical team as soon as possible.  Damien states she is sending a message to our clinical staff.

## 2024-02-26 NOTE — Telephone Encounter (Signed)
 Patient came through on call without E2C2.  Patient states she has been taking medication and is having side effects.  Patient states her blood pressure dropped down to the 80s - Patient states she cut her blood pressure medication (Losartan ) in half.  Patient states she is having muscle weakness and muscle spasms in her legs.  Patient states she is weak all over.  Patient states she can still walk, she is just really tired and doesn't feel normal. Patient states she just feels bad, but she doesn't believe it's an emergency.  Patient states it sometimes feels like she has a headache coming, but it doesn't turn into a headache.  Patient states spironolactone  (ALDACTONE ) 50 MG tablet patient states she started off doing ok and Dr. Tullo increased it to 100 MG.  Patient states since her symptoms started, she adjusted her medications.  Patient states Dr. Silva started her on itraconazole  (SPORANOX ) 100 MG capsule  Patient states she does get a little dizzy. Patient states she was getting a little dizzy before she started taking the new medications.  Patient states she feels it especially when she is bending over - it makes her feel dizzier and weaker.  I transferred patient to triage nurse, Damien.

## 2024-02-26 NOTE — ED Triage Notes (Addendum)
 Pt comes with weakness all this week. Pt stats muscle spasm and cramps in legs. Pt states late of energy. Pt states she get real dizzy when standing. Pt states today she almost fell but a chair caught her. pt states he Bp has been low at home and HR has been high.   Pt was also just started on three new meds and not sure if that has anything to do with it.

## 2024-02-26 NOTE — Telephone Encounter (Signed)
 Spoke to pt and suggested that she go to ED because Dr Marylynn is out of the office and does not have any openings this week and she does not need to wait to be seen pt stated that she will go to ED to be seen

## 2024-02-26 NOTE — ED Provider Notes (Signed)
 [facility] Provider Note    Event Date/Time   First MD Initiated Contact with Patient 02/26/24 1249     (approximate)   History   Weakness   HPI  Crystal Meyers is a 78 y.o. female with history of COPD, CAD, hypertension who presents with complaints of lower extremity muscle cramping, fatigue, weakness, mild dizziness which started about a week ago.  She reports she was started on new medications then 1 by her podiatrist and to her by her PCP but she does not remember what they were called.  Review of records demonstrates her PCP Dr. Marylynn started her on spironolactone  and iron  supplementation and her podiatry started her on itraconazole      Physical Exam   Triage Vital Signs: ED Triage Vitals  Encounter Vitals Group     BP 02/26/24 1155 115/81     Girls Systolic BP Percentile --      Girls Diastolic BP Percentile --      Boys Systolic BP Percentile --      Boys Diastolic BP Percentile --      Pulse Rate 02/26/24 1155 100     Resp 02/26/24 1155 18     Temp 02/26/24 1155 98 F (36.7 C)     Temp Source 02/26/24 1434 Oral     SpO2 02/26/24 1155 95 %     Weight 02/26/24 1156 71.7 kg (158 lb)     Height 02/26/24 1156 1.689 m (5' 6.5)     Head Circumference --      Peak Flow --      Pain Score 02/26/24 1156 9     Pain Loc --      Pain Education --      Exclude from Growth Chart --     Most recent vital signs: Vitals:   02/26/24 1155 02/26/24 1434  BP: 115/81 116/86  Pulse: 100 94  Resp: 18 17  Temp: 98 F (36.7 C) 98.1 F (36.7 C)  SpO2: 95% 98%     General: Awake, no distress.  CV:  Good peripheral perfusion.  Resp:  Normal effort.  Abd:  No distention.   Other:  Reassuring exam overall, normal strength in the lower extremities, warm and well-perfused, no edema   ED Results / Procedures / Treatments   Labs (all labs ordered are listed, but only abnormal results are displayed) Labs Reviewed  COMPREHENSIVE METABOLIC PANEL WITH  GFR - Abnormal; Notable for the following components:      Result Value   Sodium 134 (*)    Glucose, Bld 129 (*)    BUN 28 (*)    Creatinine, Ser 1.39 (*)    Total Protein 8.3 (*)    GFR, Estimated 40 (*)    All other components within normal limits  CBC - Abnormal; Notable for the following components:   RBC 5.38 (*)    Hemoglobin 15.2 (*)    All other components within normal limits  URINALYSIS, ROUTINE W REFLEX MICROSCOPIC  CBG MONITORING, ED     EKG  ED ECG REPORT I, Lamar Price, the attending physician, personally viewed and interpreted this ECG.  Date: 02/26/2024  Rhythm: normal sinus rhythm QRS Axis: normal Intervals: normal ST/T Wave abnormalities: Nonspecific changes Narrative Interpretation: no evidence of acute ischemia    RADIOLOGY CT head unremarkable, no acute abnormality    PROCEDURES:  Critical Care performed:   Procedures   MEDICATIONS ORDERED IN ED: Medications  0.9 %  sodium  chloride infusion (0 mLs Intravenous Stopped 02/26/24 1434)     IMPRESSION / MDM / ASSESSMENT AND PLAN / ED COURSE  I reviewed the triage vital signs and the nursing notes. Patient's presentation is most consistent with acute presentation with potential threat to life or bodily function.  Patient presents with weakness, dizziness, muscle cramps as detailed above, certainly medication side effect is a possibility as it is dehydration, kidney injury  Labs are pending, have ordered IV fluids  CT is unremarkable, EKG is reassuring lab work consistent with mild dehydration  Given that the patient was started on spironolactone  when the symptoms started which is known for muscle cramps I suspect this is the cause we discussed it and she feels like she would like to stop the medication at this time.  After fluids she was able to ambulate well without difficulty, she would like to go home and this is reasonable, she will discuss with her PCP.        FINAL CLINICAL  IMPRESSION(S) / ED DIAGNOSES   Final diagnoses:  Muscle cramps  Dehydration  Dizziness  Medication side effect, initial encounter     Rx / DC Orders   ED Discharge Orders     None        Note:  This document was prepared using Dragon voice recognition software and may include unintentional dictation errors.   Arlander Charleston, MD 02/26/24 [phone]

## 2024-02-26 NOTE — Telephone Encounter (Signed)
 FYI Only or Action Required?: Action required by provider: refusing 911/ED, requesting call back asap with next steps.  Patient was last seen in primary care on 02/06/2024 by Marylynn Verneita CROME, MD.  Called Nurse Triage reporting Dizziness, Spasms, Weakness, Hypotension, Tachycardia, med changes, Headache, and Excessive Sweating.  Symptoms began a week ago.  Interventions attempted: Prescription medications: cut losartan  in half per previous instruction and Rest, hydration, or home remedies.  Symptoms are: rapidly worsening.  Triage Disposition: Call EMS 911 Now  Patient/caregiver understands and will follow disposition?: No, refuses disposition      Received transfer from front desk at [facility], [place] reporting that pt has had med changes, drop in BP, experiencing weakness and dizziness.  Patient came through on call without E2C2.  Patient states she has been taking medication and is having side effects.  Patient states her blood pressure dropped down to the 80s - Patient states she cut her blood pressure medication (Losartan ) in half.   Patient states she is having muscle weakness and muscle spasms in her legs.  Patient states she is weak all over.  Patient states it sometimes feels like she has a headache coming, but it doesn't turn into a headache.   Patient states spironolactone  (ALDACTONE ) 50 MG tablet patient states she started off doing ok and the doctor increased it to 100 MG.  Patient states since her symptoms started, she adjusted her medications.  Patient states Dr. Silva started her on itraconazole  (SPORANOX ) 100 MG capsule   Patient states she does get a little dizzy. Patient states she was getting a little dizzy before she started taking the new medications.        Reason for Disposition  [1] Systolic BP < 90 AND [2] feeling weak or lightheaded (e.g., woozy, feeling like they might faint)  Answer Assessment - Initial Assessment Questions 1. BLOOD  PRESSURE: What is your blood pressure? Did you take at least two measurements 5 minutes apart?     This morning 92/67 and when stood went to 87/50 2. ONSET: When did you take your blood pressure?     This been going on for the whole week, was hoping it would go away 3. HOW: How did you take your blood pressure? (e.g., visiting nurse, automatic home BP monitor)     Home BP 4. HISTORY: Do you have a history of low blood pressure? What is your blood pressure normally?     Hx high BP 5. MEDICINES: Are you taking any medicines for blood pressure? If Yes, ask: Have they been changed recently?     Med changes recently, before started meds 110s systolic New med  6. PULSE RATE: Do you know what your pulse rate is?      HR been running 96 bpm this morning, last night 118 bpm, usually in 70s 7. OTHER SYMPTOMS: Have you been sick recently? Have you had a recent injury?      Some headache     Dizziness, feeling weak all over, not been able to do my normal house cleaning, can still walk and everything, sat around whole week doing nothing Swelling has gone down in legs No chest pain or SOB or one-sided weakness Not stumbling, just walk slower than normal Get cold and get hot sometimes Excessive sweating, don't normally sweat at all, all of a sudden get hot    Advised 911 for symptoms, pt refusing, pt would prefer daughter take her to ED Just think it's the medicine, pretty sure  it is, all started with this med, did what she said to cut losartan  in half if systolic below 100 Know Dr. Marylynn would understand that this is all related to the med, don't think, not going to ED until Dr. Marylynn tells me to Just want to know what to do with these medicines because it is the medicine  Protocols used: Blood Pressure - Low-A-AH

## 2024-02-26 NOTE — Telephone Encounter (Signed)
Pt is currently at the ED

## 2024-02-26 NOTE — Telephone Encounter (Signed)
 See previous message. Pt has agreed to go to the ED.

## 2024-02-29 ENCOUNTER — Other Ambulatory Visit (HOSPITAL_COMMUNITY): Payer: Self-pay

## 2024-03-04 ENCOUNTER — Other Ambulatory Visit (HOSPITAL_COMMUNITY): Payer: Self-pay

## 2024-03-06 DIAGNOSIS — H52223 Regular astigmatism, bilateral: Secondary | ICD-10-CM | POA: Diagnosis not present

## 2024-03-06 DIAGNOSIS — Z961 Presence of intraocular lens: Secondary | ICD-10-CM | POA: Diagnosis not present

## 2024-03-06 DIAGNOSIS — E119 Type 2 diabetes mellitus without complications: Secondary | ICD-10-CM | POA: Diagnosis not present

## 2024-03-06 DIAGNOSIS — Z135 Encounter for screening for eye and ear disorders: Secondary | ICD-10-CM | POA: Diagnosis not present

## 2024-03-06 DIAGNOSIS — H524 Presbyopia: Secondary | ICD-10-CM | POA: Diagnosis not present

## 2024-03-06 LAB — OPHTHALMOLOGY REPORT-SCANNED

## 2024-03-14 ENCOUNTER — Telehealth: Payer: Self-pay | Admitting: Internal Medicine

## 2024-03-14 DIAGNOSIS — L538 Other specified erythematous conditions: Secondary | ICD-10-CM | POA: Diagnosis not present

## 2024-03-14 DIAGNOSIS — L820 Inflamed seborrheic keratosis: Secondary | ICD-10-CM | POA: Diagnosis not present

## 2024-03-14 DIAGNOSIS — D225 Melanocytic nevi of trunk: Secondary | ICD-10-CM | POA: Diagnosis not present

## 2024-03-14 DIAGNOSIS — D2262 Melanocytic nevi of left upper limb, including shoulder: Secondary | ICD-10-CM | POA: Diagnosis not present

## 2024-03-14 DIAGNOSIS — Z85828 Personal history of other malignant neoplasm of skin: Secondary | ICD-10-CM | POA: Diagnosis not present

## 2024-03-14 DIAGNOSIS — D2272 Melanocytic nevi of left lower limb, including hip: Secondary | ICD-10-CM | POA: Diagnosis not present

## 2024-03-14 DIAGNOSIS — R208 Other disturbances of skin sensation: Secondary | ICD-10-CM | POA: Diagnosis not present

## 2024-03-14 DIAGNOSIS — D2261 Melanocytic nevi of right upper limb, including shoulder: Secondary | ICD-10-CM | POA: Diagnosis not present

## 2024-03-14 DIAGNOSIS — L570 Actinic keratosis: Secondary | ICD-10-CM | POA: Diagnosis not present

## 2024-03-14 DIAGNOSIS — D509 Iron deficiency anemia, unspecified: Secondary | ICD-10-CM

## 2024-03-14 NOTE — Telephone Encounter (Signed)
 Lab order needed

## 2024-03-15 ENCOUNTER — Other Ambulatory Visit: Payer: Self-pay | Admitting: Family

## 2024-03-15 DIAGNOSIS — D508 Other iron deficiency anemias: Secondary | ICD-10-CM

## 2024-03-24 ENCOUNTER — Other Ambulatory Visit: Payer: Self-pay | Admitting: Internal Medicine

## 2024-03-24 DIAGNOSIS — I10 Essential (primary) hypertension: Secondary | ICD-10-CM

## 2024-03-26 ENCOUNTER — Telehealth: Payer: Self-pay

## 2024-03-26 ENCOUNTER — Other Ambulatory Visit

## 2024-03-26 DIAGNOSIS — B351 Tinea unguium: Secondary | ICD-10-CM | POA: Diagnosis not present

## 2024-03-26 DIAGNOSIS — D509 Iron deficiency anemia, unspecified: Secondary | ICD-10-CM | POA: Diagnosis not present

## 2024-03-26 NOTE — Addendum Note (Signed)
 Addended by: HARRIETTE RAISIN on: 03/26/2024 02:24 PM   Modules accepted: Orders

## 2024-03-26 NOTE — Telephone Encounter (Signed)
 Copied from CRM [id number]. Topic: Clinical - Request for Lab/Test Order >> Mar 26, 2024  1:36 PM Robinson H wrote: Reason for CRM: Patient is calling to have lab order sent to [facility] inside [facility], patient states she has to have labs done there for another provider and wants to have all the labs done at once. Patient was on hold when agent when back patient disconnected call tried to reach back out to patient and phone rang and disconnected agent.  [name] [phone]

## 2024-03-26 NOTE — Telephone Encounter (Signed)
 Spoke with [facility] to let them know that I have changed the order and they should be able to see the order now. [facility] verified that they could see the new order. I also received a verbal okay to order the lab under Dr. Lula name since it was ordered by [place] because Dr. Marylynn was out of town.

## 2024-03-27 LAB — IRON,TIBC AND FERRITIN PANEL
Ferritin: 30 ng/mL (ref 15–150)
Iron Saturation: 30 % (ref 15–55)
Iron: 86 ug/dL (ref 27–139)
Total Iron Binding Capacity: 282 ug/dL (ref 250–450)
UIBC: 196 ug/dL (ref 118–369)

## 2024-03-28 ENCOUNTER — Ambulatory Visit: Payer: Self-pay

## 2024-03-28 ENCOUNTER — Ambulatory Visit: Payer: Self-pay | Admitting: Internal Medicine

## 2024-03-28 DIAGNOSIS — R918 Other nonspecific abnormal finding of lung field: Secondary | ICD-10-CM | POA: Diagnosis not present

## 2024-03-28 DIAGNOSIS — I10 Essential (primary) hypertension: Secondary | ICD-10-CM | POA: Diagnosis not present

## 2024-03-28 DIAGNOSIS — Z7984 Long term (current) use of oral hypoglycemic drugs: Secondary | ICD-10-CM | POA: Diagnosis not present

## 2024-03-28 DIAGNOSIS — E039 Hypothyroidism, unspecified: Secondary | ICD-10-CM | POA: Diagnosis not present

## 2024-03-28 DIAGNOSIS — E119 Type 2 diabetes mellitus without complications: Secondary | ICD-10-CM | POA: Diagnosis not present

## 2024-03-28 DIAGNOSIS — I252 Old myocardial infarction: Secondary | ICD-10-CM | POA: Diagnosis not present

## 2024-03-28 DIAGNOSIS — J449 Chronic obstructive pulmonary disease, unspecified: Secondary | ICD-10-CM | POA: Diagnosis not present

## 2024-03-28 DIAGNOSIS — I2510 Atherosclerotic heart disease of native coronary artery without angina pectoris: Secondary | ICD-10-CM | POA: Diagnosis not present

## 2024-03-28 DIAGNOSIS — R079 Chest pain, unspecified: Secondary | ICD-10-CM | POA: Diagnosis not present

## 2024-03-28 DIAGNOSIS — Z79899 Other long term (current) drug therapy: Secondary | ICD-10-CM | POA: Diagnosis not present

## 2024-03-28 DIAGNOSIS — E785 Hyperlipidemia, unspecified: Secondary | ICD-10-CM | POA: Diagnosis not present

## 2024-03-28 DIAGNOSIS — R0789 Other chest pain: Secondary | ICD-10-CM | POA: Diagnosis not present

## 2024-03-28 NOTE — Telephone Encounter (Signed)
   FYI Only or Action Required?: Action required by provider: update on patient condition.  Patient was last seen in primary care on 02/06/2024 by Marylynn Verneita CROME, MD.  Called Nurse Triage reporting Chest Pain.  Symptoms began today.  Interventions attempted: Rest, hydration, or home remedies.  Symptoms are: completely resolved.  Triage Disposition: Go to ED Now (Notify PCP)  Patient/caregiver understands and will follow disposition?: Yes Copied from CRM [id number]. Topic: Clinical - Red Word Triage >> Mar 28, 2024 11:01 AM Crystal Meyers wrote: Red Word that prompted transfer to Nurse Triage: Patient expresses that she is experiencing sharp chest pain as of today. Would like EKG. 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 Patient is not on this medication currently.  Symptoms:Sharp chest pain, no numbness or tingling  or pain anywhere else. No trouble breathing Reason for Disposition  [1] Chest pain (or angina) comes and goes AND [2] is happening more often (increasing in frequency) or getting worse (increasing in severity)  (Exception: Chest pains that last only a few seconds.)  Answer Assessment - Initial Assessment Questions 1. LOCATION: Where does it hurt?       Around her heart 2. RADIATION: Does the pain go anywhere else? (e.g., into neck, jaw, arms, back)     States that she has chronic back pain and was vacuuming.  Her back started to hurt along with the chest pain so she sat down to rest.  3. ONSET: When did the chest pain begin? (Minutes, hours or days)      Chest pain lasted 45 minutes, it stopped before she made the call 4. PATTERN: Does the pain come and go, or has it been constant since it started?  Does it get worse with exertion?      Comes and goes 5. DURATION: How long does it last (e.g., seconds, minutes, hours)     45 minutes 6. SEVERITY: How bad is the pain?  (e.g., Scale 1-10; mild, moderate, or severe)     Describes as light  chest pain. States that she could barely feel it and it was not sharp 7. CARDIAC RISK FACTORS: Do you have any history of heart problems or risk factors for heart disease? (e.g., angina, prior heart attack; diabetes, high blood pressure, high cholesterol, smoker, or strong family history of heart disease)     Has history of silent MI 8. PULMONARY RISK FACTORS: Do you have any history of lung disease?  (e.g., blood clots in lung, asthma, emphysema, birth control pills)     N/a 9. CAUSE: What do you think is causing the chest pain?     Not sure, occurred while vacuuming, resolved when she sat down to rest 10. OTHER SYMPTOMS: Do you have any other symptoms? (e.g., dizziness, nausea, vomiting, sweating, fever, difficulty breathing, cough)       Denies nausea, difficulty breathing When asked about dizziness she states that she has been having trouble with her blood pressure 11. PREGNANCY: Is there any chance you are pregnant? When was your last menstrual period?       N/a  Protocols used: Chest Pain-A-AH

## 2024-03-29 DIAGNOSIS — F17290 Nicotine dependence, other tobacco product, uncomplicated: Secondary | ICD-10-CM | POA: Diagnosis not present

## 2024-03-29 DIAGNOSIS — I10 Essential (primary) hypertension: Secondary | ICD-10-CM | POA: Diagnosis not present

## 2024-03-29 DIAGNOSIS — I700 Atherosclerosis of aorta: Secondary | ICD-10-CM | POA: Diagnosis not present

## 2024-03-29 DIAGNOSIS — R079 Chest pain, unspecified: Secondary | ICD-10-CM | POA: Diagnosis not present

## 2024-04-05 DIAGNOSIS — R079 Chest pain, unspecified: Secondary | ICD-10-CM | POA: Diagnosis not present

## 2024-04-08 ENCOUNTER — Other Ambulatory Visit: Payer: Self-pay | Admitting: Internal Medicine

## 2024-04-12 DIAGNOSIS — R079 Chest pain, unspecified: Secondary | ICD-10-CM | POA: Diagnosis not present

## 2024-04-17 DIAGNOSIS — I10 Essential (primary) hypertension: Secondary | ICD-10-CM | POA: Diagnosis not present

## 2024-04-17 DIAGNOSIS — E7849 Other hyperlipidemia: Secondary | ICD-10-CM | POA: Diagnosis not present

## 2024-04-17 DIAGNOSIS — E119 Type 2 diabetes mellitus without complications: Secondary | ICD-10-CM | POA: Diagnosis not present

## 2024-04-17 DIAGNOSIS — R079 Chest pain, unspecified: Secondary | ICD-10-CM | POA: Diagnosis not present

## 2024-04-17 DIAGNOSIS — I2510 Atherosclerotic heart disease of native coronary artery without angina pectoris: Secondary | ICD-10-CM | POA: Diagnosis not present

## 2024-04-17 DIAGNOSIS — I700 Atherosclerosis of aorta: Secondary | ICD-10-CM | POA: Diagnosis not present

## 2024-04-21 ENCOUNTER — Other Ambulatory Visit: Payer: Self-pay | Admitting: Internal Medicine

## 2024-04-24 ENCOUNTER — Other Ambulatory Visit: Payer: Self-pay | Admitting: Acute Care

## 2024-04-24 ENCOUNTER — Ambulatory Visit (INDEPENDENT_AMBULATORY_CARE_PROVIDER_SITE_OTHER): Admitting: *Deleted

## 2024-04-24 VITALS — Ht 66.0 in | Wt 163.0 lb

## 2024-04-24 DIAGNOSIS — Z0000 Encounter for general adult medical examination without abnormal findings: Secondary | ICD-10-CM | POA: Diagnosis not present

## 2024-04-24 DIAGNOSIS — Z87891 Personal history of nicotine dependence: Secondary | ICD-10-CM

## 2024-04-24 NOTE — Patient Instructions (Signed)
 Crystal Meyers,  Thank you for taking the time for your Medicare Wellness Visit. I appreciate your continued commitment to your health goals. Please review the care plan we discussed, and feel free to reach out if I can assist you further.  Medicare recommends these wellness visits once per year to help you and your care team stay ahead of potential health issues. These visits are designed to focus on prevention, allowing your provider to concentrate on managing your acute and chronic conditions during your regular appointments.  Please note that Annual Wellness Visits do not include a physical exam. Some assessments may be limited, especially if the visit was conducted virtually. If needed, we may recommend a separate in-person follow-up with your provider.  Ongoing Care Seeing your primary care provider every 3 to 6 months helps us  monitor your health and provide consistent, personalized care.  Remember to get your flu vaccine.  Referrals If a referral was made during today's visit and you haven't received any updates within two weeks, please contact the referred provider directly to check on the status.  Recommended Screenings:  Health Maintenance  Topic Date Due   COVID-19 Vaccine (3 - Pfizer risk series) 04/10/2020   Screening for Lung Cancer  10/06/2023   Eye exam for diabetics  12/27/2023   Complete foot exam   01/09/2024   Flu Shot  02/09/2024   Hemoglobin A1C  08/08/2024   Yearly kidney health urinalysis for diabetes  02/05/2025   Yearly kidney function blood test for diabetes  02/25/2025   Medicare Annual Wellness Visit  04/24/2025   Colon Cancer Screening  04/03/2028   DTaP/Tdap/Td vaccine (2 - Td or Tdap) 04/13/2028   Pneumococcal Vaccine for age over 43  Completed   DEXA scan (bone density measurement)  Completed   Hepatitis C Screening  Completed   Zoster (Shingles) Vaccine  Completed   Meningitis B Vaccine  Aged Out       04/24/2024    2:12 PM  Advanced Directives   Does Patient Have a Medical Advance Directive? No  Would patient like information on creating a medical advance directive? No - Patient declined   Advance Care Planning is important because it: Ensures you receive medical care that aligns with your values, goals, and preferences. Provides guidance to your family and loved ones, reducing the emotional burden of decision-making during critical moments.  Vision: Annual vision screenings are recommended for early detection of glaucoma, cataracts, and diabetic retinopathy. These exams can also reveal signs of chronic conditions such as diabetes and high blood pressure.  Dental: Annual dental screenings help detect early signs of oral cancer, gum disease, and other conditions linked to overall health, including heart disease and diabetes.  Please see the attached documents for additional preventive care recommendations.

## 2024-04-24 NOTE — Progress Notes (Signed)
 Subjective:   Crystal Meyers is a 78 y.o. who presents for a Medicare Wellness preventive visit.  As a reminder, Annual Wellness Visits don't include a physical exam, and some assessments may be limited, especially if this visit is performed virtually. We may recommend an in-person follow-up visit with your provider if needed.  Visit Complete: Virtual I connected with  Crystal Meyers on 04/24/24 by a audio enabled telemedicine application and verified that I am speaking with the correct person using two identifiers.  Patient Location: Home  Provider Location: Home Office  I discussed the limitations of evaluation and management by telemedicine. The patient expressed understanding and agreed to proceed.  Vital Signs: Because this visit was a virtual/telehealth visit, some criteria may be missing or patient reported. Any vitals not documented were not able to be obtained and vitals that have been documented are patient reported.  VideoDeclined- This patient declined Librarian, academic. Therefore the visit was completed with audio only.  Persons Participating in Visit: Patient.  AWV Questionnaire: No: Patient Medicare AWV questionnaire was not completed prior to this visit.  Cardiac Risk Factors include: advanced age (>89men, >51 women);diabetes mellitus;dyslipidemia;hypertension;Other (see comment), Risk factor comments: H/O tobacco, chews nicotine  gum,  CAD     Objective:    Today's Vitals   04/24/24 1342  Weight: 163 lb (73.9 kg)  Height: 5' 6 (1.676 m)   Body mass index is 26.31 kg/m.     04/24/2024    2:12 PM 02/26/2024   11:57 AM 04/11/2023   10:07 AM 04/04/2023    7:36 AM 04/06/2022   11:29 AM 06/22/2021   10:37 AM 04/01/2021   10:27 AM  Advanced Directives  Does Patient Have a Medical Advance Directive? No No Yes Yes Yes Yes Yes  Type of Surveyor, minerals;Living will Healthcare Power of [place];Living will  Healthcare Power of [place];Living will Living will;Healthcare Power of [employer] Power of [place];Living will  Does patient want to make changes to medical advance directive?      Yes (ED - Information included in AVS) No - Patient declined  Copy of Healthcare Power of Attorney in Chart?   No - copy requested  No - copy requested  No - copy requested  Would patient like information on creating a medical advance directive? No - Patient declined          Current Medications (verified) Outpatient Encounter Medications as of 04/24/2024  Medication Sig   ACCU-CHEK GUIDE TEST test strip USE AS DIRECTED TO CHECK BLOOD GLUCOSE 3 TIMES DAILY (MORNING, NOON, AND AT BEDTIME)   aspirin  EC 81 MG tablet Take 1 tablet (81 mg total) by mouth daily.   atorvastatin  (LIPITOR) 40 MG tablet TAKE 1 TABLET EVERY DAY   dapagliflozin  propanediol (FARXIGA ) 10 MG TABS tablet Take 1 tablet (10 mg total) by mouth daily.   Iron , Ferrous Sulfate , 325 (65 Fe) MG TABS Take 325 mg by mouth daily. (Patient taking differently: Take 325 mg by mouth every other day.)   itraconazole  (SPORANOX ) 100 MG capsule Take twice a day for 7 days, then do not take for 3 weeks. Then repeat twice daily x7 days, then do not take for 3 weeks. Repeat final course twice daily x7 days then stop taking.   Lancets (ONETOUCH DELICA PLUS LANCET30G) MISC TEST BLOOD SUGAR ONE TIME DAILY   levothyroxine  (SYNTHROID ) 88 MCG tablet TAKE 1 TABLET EVERY DAY BEFORE BREAKFAST   nicotine  polacrilex (  NICORETTE ) 2 MG gum Take 2 mg by mouth as needed for smoking cessation.   omeprazole  (PRILOSEC) 40 MG capsule TAKE 1 CAPSULE EVERY DAY   rOPINIRole  (REQUIP ) 0.25 MG tablet Take 1 tablet (0.25 mg total) by mouth at bedtime as needed.   traZODone  (DESYREL ) 100 MG tablet TAKE 1 AND 1/2 TABLETS AT BEDTIME   venlafaxine  XR (EFFEXOR -XR) 150 MG 24 hr capsule TAKE 1 CAPSULE EVERY DAY   Vitamin D , Cholecalciferol, 25 MCG (1000 UT) TABS Take 1 tablet by mouth daily.    B Complex-C (B-COMPLEX WITH VITAMIN C) tablet Take 1 tablet by mouth daily.   Blood Glucose Monitoring Suppl DEVI 1 each by Does not apply route in the morning, at noon, and at bedtime. May substitute to any manufacturer covered by patient's insurance.   Blood Glucose Monitoring Suppl DEVI 1 each by Does not apply route in the morning, at noon, and at bedtime. May substitute to any manufacturer covered by patient's insurance.   cephALEXin  (KEFLEX ) 500 MG capsule Take 1 capsule (500 mg total) by mouth 4 (four) times daily.   losartan  (COZAAR ) 50 MG tablet TAKE 1 TABLET AT BEDTIME   nitroGLYCERIN  (NITROSTAT ) 0.4 MG SL tablet Place under the tongue. (Patient not taking: Reported on 04/24/2024)   spironolactone  (ALDACTONE ) 50 MG tablet Take 1 tablet (50 mg total) by mouth daily.   Turmeric 500 MG CAPS Take 2 capsules by mouth daily. (Patient not taking: Reported on 04/24/2024)   vitamin B-12 (CYANOCOBALAMIN ) 1000 MCG tablet Take 1,000 mcg by mouth daily. (Patient not taking: Reported on 04/24/2024)   No facility-administered encounter medications on file as of 04/24/2024.    Allergies (verified) Lamisil [terbinafine] and No known allergies   History: Past Medical History:  Diagnosis Date   Anxiety    Asthma    Barrett's esophagus    Breast cancer (HCC) 01/2009   left , invasive lobular carcinoma   Colon polyps    Complication of anesthesia    difficulty waking up and sleep apnea, low sats   COPD (chronic obstructive pulmonary disease) (HCC)    Coronary artery disease    Depression    GERD (gastroesophageal reflux disease)    Hypercholesteremia    Hypertension    Hypothyroidism    IDA (iron  deficiency anemia)    Personal history of tobacco use, presenting hazards to health 05/06/2015   Personal history of tobacco use, presenting hazards to health 05/06/2015   Pneumonia    hx of   PVC (premature ventricular contraction)    Skin cancer    Sleep apnea    wears CPAP nightly    Tobacco abuse 07/27/2011   She resumed smoking after her diagnosis of breast cancer but stopped 3 days ago.    Trigeminal neuralgia    Trigeminal neuralgia    Past Surgical History:  Procedure Laterality Date   BIOPSY  04/04/2023   Procedure: BIOPSY;  Surgeon: Aundria, Ladell POUR, MD;  Location: [facility] ENDOSCOPY;  Service: Gastroenterology;;   BREAST SURGERY Bilateral 2011   dbl mastectomy   COLONOSCOPY W/ POLYPECTOMY     COLONOSCOPY WITH PROPOFOL  N/A 04/20/2020   Procedure: COLONOSCOPY WITH PROPOFOL ;  Surgeon: Toledo, Ladell POUR, MD;  Location: [facility] ENDOSCOPY;  Service: Gastroenterology;  Laterality: N/A;   COLONOSCOPY WITH PROPOFOL  N/A 04/04/2023   Procedure: COLONOSCOPY WITH PROPOFOL ;  Surgeon: Toledo, Ladell POUR, MD;  Location: [facility] ENDOSCOPY;  Service: Gastroenterology;  Laterality: N/A;   CRANIECTOMY Right 01/21/2016   Procedure: Microvascular Decompression - right trigemminal  nerve;  Surgeon: Gerldine Maizes, MD;  Location: [facility] NEURO ORS;  Service: Neurosurgery;  Laterality: Right;   CRANIOTOMY Right 02/03/2016   Procedure: REPAIR OF CEREBROSPINAL FLUID LEAK AND HARVEST ABDOMINAL FAT GRAFT;  Surgeon: Gerldine Maizes, MD;  Location: [facility]UR[facility];  Service: Neurosurgery;  Laterality: Right;   ESOPHAGOGASTRODUODENOSCOPY N/A 04/20/2020   Procedure: ESOPHAGOGASTRODUODENOSCOPY (EGD);  Surgeon: Toledo, Ladell POUR, MD;  Location: [facility] ENDOSCOPY;  Service: Gastroenterology;  Laterality: N/A;   ESOPHAGOGASTRODUODENOSCOPY (EGD) WITH PROPOFOL  N/A 04/04/2023   Procedure: ESOPHAGOGASTRODUODENOSCOPY (EGD) WITH PROPOFOL ;  Surgeon: Toledo, Ladell POUR, MD;  Location: [facility] ENDOSCOPY;  Service: Gastroenterology;  Laterality: N/A;   LUMBAR LAMINECTOMY/DECOMPRESSION MICRODISCECTOMY Left 08/11/2015   Procedure: Left Lumbar four-five microdiscectomy;  Surgeon: Gerldine Maizes, MD;  Location: [facility];  Service: Neurosurgery;  Laterality: Left;   MASTECTOMY  05/2009   bilateral, reconstructive surgery 09/2009    MASTECTOMY     Bilateral    POLYPECTOMY  04/04/2023   Procedure: POLYPECTOMY;  Surgeon: Toledo, Ladell POUR, MD;  Location: [facility] ENDOSCOPY;  Service: Gastroenterology;;   repair ectropion extensive Bilateral    skin cancer removal     Family History  Problem Relation Age of Onset   Mental illness Mother    Diabetes Mother    Hypertension Mother    Hyperlipidemia Mother    Alzheimer's disease Mother    Hypertension Father    Hyperlipidemia Father    Stroke Father    Heart attack Father    Diabetes Father    Deep vein thrombosis Father    Colon cancer Father    Cancer Brother        Lung   Diabetes Brother    Diabetes Brother    Stroke Maternal Grandmother    Social History   Socioeconomic History   Marital status: Widowed    Spouse name: Not on file   Number of children: Not on file   Years of education: Not on file   Highest education level: Not on file  Occupational History   Not on file  Tobacco Use   Smoking status: Former    Current packs/day: 0.00    Average packs/day: 1 pack/day for 54.0 years (54.0 ttl pk-yrs)    Types: Cigarettes    Start date: 04/01/1967    Quit date: 03/31/2021    Years since quitting: 3.0   Smokeless tobacco: Never   Tobacco comments:    Refused cessation material    04/24/24 Chews nicotine  gum  Vaping Use   Vaping status: Never Used  Substance and Sexual Activity   Alcohol use: No   Drug use: No   Sexual activity: Never  Other Topics Concern   Not on file  Social History Narrative   Lives with mother.   Recently widowed.   Always uses seat belts   Has a bird and a dog.   No exercise.   Social Drivers of Corporate investment banker Strain: Low Risk  (04/24/2024)   Overall Financial Resource Strain (CARDIA)    Difficulty of Paying Living Expenses: Not hard at all  Food Insecurity: No Food Insecurity (04/24/2024)   Hunger Vital Sign    Worried About Running Out of Food in the Last Year: Never true    Ran Out of Food in the  Last Year: Never true  Transportation Needs: No Transportation Needs (04/24/2024)   PRAPARE - Administrator, Civil Service (Medical): No    Lack of Transportation (Non-Medical): No  Physical Activity:  Inactive (04/24/2024)   Exercise Vital Sign    Days of Exercise per Week: 0 days    Minutes of Exercise per Session: 0 min  Stress: No Stress Concern Present (04/24/2024)   [employer] of Occupational Health - Occupational Stress Questionnaire    Feeling of Stress: Not at all  Social Connections: Socially Isolated (04/24/2024)   Social Connection and Isolation Panel    Frequency of Communication with Friends and Family: More than three times a week    Frequency of Social Gatherings with Friends and Family: More than three times a week    Attends Religious Services: Never    Database administrator or Organizations: No    Attends Banker Meetings: Never    Marital Status: Widowed    Tobacco Counseling Counseling given: Not Answered Tobacco comments: Refused cessation material 04/24/24 Chews nicotine  gum    Clinical Intake:  Pre-visit preparation completed: Yes  Pain : No/denies pain     BMI - recorded: 26.31 Nutritional Status: BMI 25 -29 Overweight Nutritional Risks: None Diabetes: Yes CBG done?: Yes (FBS 114 per patient) CBG resulted in Enter/ Edit results?: No  Lab Results  Component Value Date   HGBA1C 6.3 02/06/2024   HGBA1C 6.3 08/09/2023   HGBA1C 6.1 01/09/2023     How often do you need to have someone help you when you read instructions, pamphlets, or other written materials from your doctor or pharmacy?: 1 - Never  Interpreter Needed?: No  Information entered by :: R. Kataleia Quaranta LPN   Activities of Daily Living     04/24/2024    1:45 PM  In your present state of health, do you have any difficulty performing the following activities:  Hearing? 0  Vision? 0  Difficulty concentrating or making decisions? 1  Walking or  climbing stairs? 1  Dressing or bathing? 0  Doing errands, shopping? 1  Preparing Food and eating ? N  Using the Toilet? N  In the past six months, have you accidently leaked urine? N  Do you have problems with loss of bowel control? N  Managing your Medications? N  Managing your Finances? N  Housekeeping or managing your Housekeeping? Y    Patient Care Team: Marylynn Verneita CROME, MD as PCP - General (Internal Medicine) Herminio Miu, MD (Unknown Physician Specialty) Dennise Capri, MD (Nephrology) Aundria Ladell POUR, MD as Consulting Physician (Gastroenterology) Jama Margery ORN, MD as Referring Physician (Cardiology)  I have updated your Care Teams any recent Medical Services you may have received from other providers in the past year.     Assessment:   This is a routine wellness examination for Crystal Meyers.  Hearing/Vision screen Hearing Screening - Comments:: No issues Vision Screening - Comments:: glasses   Goals Addressed             This Visit's Progress    Patient Stated       Wants to stop using nicotine  gum       Depression Screen     04/24/2024    2:07 PM 02/06/2024   11:56 AM 08/09/2023    1:34 PM 04/11/2023   10:01 AM 01/09/2023    9:58 AM 07/06/2022    9:33 AM 04/06/2022   11:28 AM  PHQ 2/9 Scores  PHQ - 2 Score 0 0  0 0 0 0  PHQ- 9 Score 3   1 0 0 0  Exception Documentation   Patient refusal        Fall Risk  04/24/2024    1:51 PM 02/06/2024   11:55 AM 08/09/2023    1:33 PM 04/11/2023    9:55 AM 01/09/2023    9:58 AM  Fall Risk   Falls in the past year? 1 1 0 0 0  Number falls in past yr: 0  0 0 0  Injury with Fall? 0 0 0 0 0  Risk for fall due to : History of fall(s) History of fall(s) No Fall Risks No Fall Risks No Fall Risks  Risk for fall due to: Comment dizzy and fell back on the bed      Follow up Falls evaluation completed;Falls prevention discussed Falls evaluation completed Falls evaluation completed Falls prevention discussed;Falls  evaluation completed Falls evaluation completed    MEDICARE RISK AT HOME:  Medicare Risk at Home Any stairs in or around the home?: Yes If so, are there any without handrails?: No Home free of loose throw rugs in walkways, pet beds, electrical cords, etc?: Yes Adequate lighting in your home to reduce risk of falls?: Yes Life alert?: No Use of a cane, walker or w/c?: No Grab bars in the bathroom?: No Shower chair or bench in shower?: No Elevated toilet seat or a handicapped toilet?: Yes  TIMED UP AND GO:  Was the test performed?  No  Cognitive Function: 6CIT completed        04/24/2024    2:13 PM 04/11/2023   10:08 AM 04/06/2022   11:30 AM 04/01/2021   10:33 AM 03/31/2020   10:26 AM  6CIT Screen  What Year? 0 points 0 points 0 points 0 points 0 points  What month? 0 points 0 points 0 points 0 points 0 points  What time? 0 points 0 points 0 points 0 points   Count back from 20 0 points 0 points 0 points 0 points 0 points  Months in reverse 2 points 0 points 0 points 0 points 0 points  Repeat phrase 2 points 2 points 0 points 0 points 0 points  Total Score 4 points 2 points 0 points 0 points     Immunizations Immunization History  Administered Date(s) Administered   Fluad Quad(high Dose 65+) 04/10/2019, 04/08/2020, 06/28/2021, 04/04/2022   INFLUENZA, HIGH DOSE SEASONAL PF 06/09/2017, 03/15/2018   Influenza,inj,Quad PF,6+ Mos 09/05/2016   PFIZER(Purple Top)SARS-COV-2 Vaccination 02/19/2020, 03/13/2020   Pneumococcal Conjugate-13 11/25/2013   Pneumococcal Polysaccharide-23 11/26/2006, 07/03/2015   Respiratory Syncytial Virus Vaccine,Recomb Aduvanted(Arexvy) 05/14/2022   Tdap 04/13/2018   Zoster Recombinant(Shingrix) 10/11/2021, 01/06/2022    Screening Tests Health Maintenance  Topic Date Due   COVID-19 Vaccine (3 - Pfizer risk series) 04/10/2020   Lung Cancer Screening  10/06/2023   OPHTHALMOLOGY EXAM  12/27/2023   FOOT EXAM  01/09/2024   Influenza Vaccine   02/09/2024   Medicare Annual Wellness (AWV)  04/10/2024   HEMOGLOBIN A1C  08/08/2024   Diabetic kidney evaluation - Urine ACR  02/05/2025   Diabetic kidney evaluation - eGFR measurement  02/25/2025   Colonoscopy  04/03/2028   DTaP/Tdap/Td (2 - Td or Tdap) 04/13/2028   Pneumococcal Vaccine: 50+ Years  Completed   DEXA SCAN  Completed   Hepatitis C Screening  Completed   Zoster Vaccines- Shingrix  Completed   Meningococcal B Vaccine  Aged Out    Health Maintenance Items Addressed: Discussed the need to update flu vaccine. Patient declines covid vaccine. Patient needs a foot exam at next office .   Additional Screening:  Vision Screening: Recommended annual ophthalmology exams for early detection  of glaucoma and other disorders of the eye. Is the patient up to date with their annual eye exam?  Yes  Who is the provider or what is the name of the office in which the patient attends annual eye exams?  [facility]  Called and requested office notes from 03/06/24  Dental Screening: Recommended annual dental exams for proper oral hygiene  Community Resource Referral / Chronic Care Management: CRR required this visit?  No   CCM required this visit?  No   Plan:    I have personally reviewed and noted the following in the patient's chart:   Medical and social history Use of alcohol, tobacco or illicit drugs  Current medications and supplements including opioid prescriptions. Patient is not currently taking opioid prescriptions. Functional ability and status Nutritional status Physical activity Advanced directives List of other physicians Hospitalizations, surgeries, and ER visits in previous 12 months Vitals Screenings to include cognitive, depression, and falls Referrals and appointments  In addition, I have reviewed and discussed with patient certain preventive protocols, quality metrics, and best practice recommendations. A written personalized care plan for preventive  services as well as general preventive health recommendations were provided to patient.   Angeline Fredericks, LPN   89/84/7974   After Visit Summary: (MyChart) Due to this being a telephonic visit, the after visit summary with patients personalized plan was offered to patient via MyChart   Notes: Nothing significant to report at this time.  Patient needs a foot exam completed and documented at next office visit.

## 2024-05-01 ENCOUNTER — Ambulatory Visit
Admission: RE | Admit: 2024-05-01 | Discharge: 2024-05-01 | Disposition: A | Discharge status: Home / Self Care | Source: Ambulatory Visit | Attending: Internal Medicine | Admitting: Internal Medicine

## 2024-05-01 DIAGNOSIS — F17210 Nicotine dependence, cigarettes, uncomplicated: Secondary | ICD-10-CM | POA: Diagnosis not present

## 2024-05-01 DIAGNOSIS — Z87891 Personal history of nicotine dependence: Secondary | ICD-10-CM | POA: Insufficient documentation

## 2024-05-07 ENCOUNTER — Other Ambulatory Visit: Payer: Self-pay

## 2024-05-07 DIAGNOSIS — Z87891 Personal history of nicotine dependence: Secondary | ICD-10-CM

## 2024-05-07 DIAGNOSIS — Z122 Encounter for screening for malignant neoplasm of respiratory organs: Secondary | ICD-10-CM

## 2024-05-09 ENCOUNTER — Other Ambulatory Visit: Payer: Self-pay | Admitting: Podiatry

## 2024-05-09 ENCOUNTER — Telehealth: Payer: Self-pay

## 2024-05-09 NOTE — Telephone Encounter (Signed)
 PAP: Patient assistance application for Farxiga through [employer] (AZ&Me) has been mailed to pt's home address on file. Provider portion of application will be faxed to provider's office.

## 2024-05-14 NOTE — Telephone Encounter (Signed)
 Received provider portion PAP application Farxiga  (AZ&ME).

## 2024-05-17 ENCOUNTER — Telehealth: Payer: Self-pay | Admitting: Internal Medicine

## 2024-05-17 MED ORDER — ROSUVASTATIN CALCIUM 20 MG PO TABS: 20.0000 mg | ORAL_TABLET | Freq: Every day | ORAL | 0 refills | Status: DC

## 2024-05-17 NOTE — Telephone Encounter (Signed)
 Noted

## 2024-05-17 NOTE — Addendum Note (Signed)
 Addended by: MARYLYNN VERNEITA CROME on: 05/17/2024 04:50 PM   Modules accepted: Orders

## 2024-05-17 NOTE — Telephone Encounter (Signed)
 Copied from CRM #[id number]. Topic: Clinical - Medication Question >> May 17, 2024 12:39 PM Suzen RAMAN wrote: Reason for CRM: Patient would like to know if there is an alternate drug that can be called into the pharmacy since atorvastatin  (LIPITOR) 40 MG tablet has been recalled    CB# [phone]

## 2024-05-23 DIAGNOSIS — D631 Anemia in chronic kidney disease: Secondary | ICD-10-CM | POA: Diagnosis not present

## 2024-05-23 DIAGNOSIS — E1122 Type 2 diabetes mellitus with diabetic chronic kidney disease: Secondary | ICD-10-CM | POA: Diagnosis not present

## 2024-05-23 DIAGNOSIS — I10 Essential (primary) hypertension: Secondary | ICD-10-CM | POA: Diagnosis not present

## 2024-05-23 DIAGNOSIS — N1831 Chronic kidney disease, stage 3a: Secondary | ICD-10-CM | POA: Diagnosis not present

## 2024-05-27 DIAGNOSIS — N1831 Chronic kidney disease, stage 3a: Secondary | ICD-10-CM | POA: Diagnosis not present

## 2024-05-27 DIAGNOSIS — I10 Essential (primary) hypertension: Secondary | ICD-10-CM | POA: Diagnosis not present

## 2024-05-27 DIAGNOSIS — Z7984 Long term (current) use of oral hypoglycemic drugs: Secondary | ICD-10-CM | POA: Diagnosis not present

## 2024-05-27 DIAGNOSIS — E1122 Type 2 diabetes mellitus with diabetic chronic kidney disease: Secondary | ICD-10-CM | POA: Diagnosis not present

## 2024-06-13 ENCOUNTER — Other Ambulatory Visit: Payer: Self-pay | Admitting: Internal Medicine

## 2024-06-13 MED ORDER — ROSUVASTATIN CALCIUM 20 MG PO TABS: 20.0000 mg | ORAL_TABLET | Freq: Every day | ORAL | 3 refills | Status: DC

## 2024-06-13 NOTE — Telephone Encounter (Signed)
 Copied from CRM #[id number]. Topic: Clinical - Medication Refill >> Jun 13, 2024  9:09 AM Brittany M wrote: Medication: rosuvastatin  (CRESTOR ) 20 MG tablet  Has the patient contacted their pharmacy? Yes (Agent: If no, request that the patient contact the pharmacy for the refill. If patient does not wish to contact the pharmacy document the reason why and proceed with request.) (Agent: If yes, when and what did the pharmacy advise?)  This is the patient's preferred pharmacy:    [facility] - [place], [place] - [address] [place] [address] Phone: [phone] Fax: [phone]    Is this the correct pharmacy for this prescription? Yes If no, delete pharmacy and type the correct one.   Has the prescription been filled recently? Yes  Is the patient out of the medication? Yes  Has the patient been seen for an appointment in the last year OR does the patient have an upcoming appointment? Yes  Can we respond through MyChart? Yes  Agent: Please be advised that Rx refills may take up to 3 business days. We ask that you follow-up with your pharmacy.

## 2024-06-17 ENCOUNTER — Ambulatory Visit: Admitting: Podiatry

## 2024-06-19 ENCOUNTER — Ambulatory Visit: Admitting: Podiatry

## 2024-06-19 VITALS — Ht 66.0 in | Wt 163.0 lb

## 2024-06-19 DIAGNOSIS — B351 Tinea unguium: Secondary | ICD-10-CM

## 2024-06-23 NOTE — Progress Notes (Signed)
°  Subjective:  Patient ID: Crystal Meyers, female    DOB: [birth year],  MRN: [id number]  Chief Complaint  Patient presents with   Nail Problem    Rm 6 follow up on  nail fungus treatment. . Pt states pain medial border of left hallux for past 1 month. Visible discoloration of the right and left hallux, no nail thickening.    Discussed the use of AI scribe software for clinical note transcription with the patient, who gave verbal consent to proceed.  History of Present Illness Crystal Meyers is a 78 year old female who presents with worsening toenail and skin issues.  She returns today w/ improvement in her nails after sporanox  no acute AEs from med      Objective:    Physical Exam VASCULAR: DP and PT pulse palpable. Foot is warm and well-perfused. Capillary fill time is brisk. DERMATOLOGIC: Normal skin turgor, texture, and temperature. No open lesions, rashes, or ulcerations. Onychomycosis 90% improvement NEUROLOGIC: Normal sensation to light touch and pressure. No paresthesias on examination. ORTHOPEDIC: Smooth, pain-free range of motion of all examined joints. No ecchymosis or bruising. No gross deformity. No pain to palpation.         Results LABS   Serum creatinine: Normal (02/05/2022)   GFR: 45.6 mL/min/1.73 m (02/05/2022)   ALT: Normal (02/05/2022)   AST: Normal (02/05/2022)   Total protein: Normal (02/05/2022)   Assessment:   1. Onychomycosis      Plan:  Patient was evaluated and treated and all questions answered.  Assessment and Plan Assessment & Plan Onychomycosis of bilateral halluces Excellent response to sporanox  pulsed dosing tx. No further AF therapy right now, allow nail to grow and resolve. Re evalaute in 5 months      No follow-ups on file.

## 2024-06-26 NOTE — Telephone Encounter (Signed)
 PAP: Application for Crystal Meyers has been submitted to [employer] (AZ&Me), via fax

## 2024-06-27 ENCOUNTER — Telehealth: Payer: Self-pay | Admitting: Internal Medicine

## 2024-06-27 NOTE — Telephone Encounter (Signed)
 Pt drop off patient assistance form for Suzen to be filled out. I placed the envelope in Electronic data systems upfront.

## 2024-07-08 NOTE — Telephone Encounter (Signed)
 PAP: Patient assistance application for Farxiga  has been approved by PAP Companies: [employer] from 07/11/2024 to 07/10/2025. Medication should be delivered to PAP Delivery: Home. For further shipping updates, please contact [employer] (AZ[employer]Me) at [phone]. Patient ID is: [id number]

## 2024-07-18 ENCOUNTER — Other Ambulatory Visit: Payer: Self-pay | Admitting: Internal Medicine

## 2024-07-18 NOTE — Telephone Encounter (Signed)
 Copied from CRM [id number]. Topic: Clinical - Medication Refill >> Jul 18, 2024 10:56 AM Rea C wrote: Medication: rosuvastatin  (CRESTOR ) 20 MG tablet  Has the patient contacted their pharmacy? Yes but clinic has not been able to get in touch with clinic  (Agent: If no, request that the patient contact the pharmacy for the refill. If patient does not wish to contact the pharmacy document the reason why and proceed with request.) (Agent: If yes, when and what did the pharmacy advise?)  This is the patient's preferred pharmacy:    [facility] - [place], [place] - [address] [place] [address] Phone: [phone] Fax: [phone]    Is this the correct pharmacy for this prescription? Yes If no, delete pharmacy and type the correct one.   Has the prescription been filled recently? Yes  Is the patient out of the medication? Patient has a few left.   Has the patient been seen for an appointment in the last year OR does the patient have an upcoming appointment? Yes  Can we respond through MyChart? Yes  Agent: Please be advised that Rx refills may take up to 3 business days. We ask that you follow-up with your pharmacy.

## 2024-07-19 MED ORDER — ROSUVASTATIN CALCIUM 20 MG PO TABS: 20.0000 mg | ORAL_TABLET | Freq: Every day | ORAL | 3 refills | Status: DC

## 2024-07-25 ENCOUNTER — Other Ambulatory Visit: Payer: Self-pay | Admitting: Internal Medicine

## 2024-07-25 NOTE — Telephone Encounter (Signed)
 Copied from CRM #[id number]. Topic: Clinical - Medication Refill >> Jul 25, 2024  1:53 PM Drema MATSU wrote: Medication: omeprazole  (PRILOSEC) 40 MG capsule  Has the patient contacted their pharmacy? Yes (Agent: If no, request that the patient contact the pharmacy for the refill. If patient does not wish to contact the pharmacy document the reason why and proceed with request.) needing approval from dr/ no more refills  (Agent: If yes, when and what did the pharmacy advise?)  This is the patient's preferred pharmacy:   [facility] - [place], [place] - [address] [place] [address] Phone: [phone] Fax: [phone]  Is this the correct pharmacy for this prescription? Yes If no, delete pharmacy and type the correct one.   Has the prescription been filled recently? no  Is the patient out of the medication? Yes out for 6 days   Has the patient been seen for an appointment in the last year OR does the patient have an upcoming appointment? Yes  Can we respond through MyChart? No  Agent: Please be advised that Rx refills may take up to 3 business days. We ask that you follow-up with your pharmacy.

## 2024-07-29 MED ORDER — OMEPRAZOLE 40 MG PO CPDR: 40.0000 mg | DELAYED_RELEASE_CAPSULE | Freq: Every day | ORAL | 3 refills | Status: AC

## 2024-08-09 ENCOUNTER — Telehealth: Payer: Self-pay

## 2024-08-09 DIAGNOSIS — I10 Essential (primary) hypertension: Secondary | ICD-10-CM

## 2024-08-09 DIAGNOSIS — E039 Hypothyroidism, unspecified: Secondary | ICD-10-CM

## 2024-08-09 DIAGNOSIS — E1169 Type 2 diabetes mellitus with other specified complication: Secondary | ICD-10-CM

## 2024-08-09 DIAGNOSIS — E1122 Type 2 diabetes mellitus with diabetic chronic kidney disease: Secondary | ICD-10-CM

## 2024-08-09 NOTE — Telephone Encounter (Signed)
 Copied from CRM [id number]. Topic: Clinical - Request for Lab/Test Order >> Aug 09, 2024  3:59 PM Pinkey ORN wrote: Reason for CRM: Lab Orders >> Aug 09, 2024  3:59 PM Pinkey ORN wrote: Patient is requesting to have labs completed before her 6 month follow up appointment.

## 2024-08-12 ENCOUNTER — Ambulatory Visit: Admitting: Internal Medicine

## 2024-08-12 ENCOUNTER — Other Ambulatory Visit: Payer: Self-pay | Admitting: Internal Medicine

## 2024-08-13 ENCOUNTER — Telehealth: Payer: Self-pay | Admitting: *Deleted

## 2024-08-13 NOTE — Telephone Encounter (Signed)
 Spoke with pharmacy and they stated that when they tried to run the rx earlier it rejected stating to early to fill however she ran it again and it went through. She stated that pt will receive medication in 3 to 5 days. Called pt to let her know and she gave a verbal understanding.

## 2024-08-15 NOTE — Telephone Encounter (Signed)
 Patient needs lab appointment before next visit with doctor Tullo

## 2024-08-15 NOTE — Addendum Note (Signed)
 Addended by: MARYLYNN VERNEITA CROME on: 08/15/2024 04:02 PM   Modules accepted: Orders

## 2024-08-15 NOTE — Addendum Note (Signed)
 Addended by: Nyilah Kight D on: 08/15/2024 03:34 PM   Modules accepted: Orders

## 2024-08-15 NOTE — Telephone Encounter (Signed)
 Lab orders pended for your approval. If needed we will call patient to schedule lab appointment.

## 2024-09-10 ENCOUNTER — Ambulatory Visit: Admitting: Internal Medicine

## 2024-10-21 ENCOUNTER — Ambulatory Visit: Admitting: Podiatry

## 2025-04-29 ENCOUNTER — Ambulatory Visit
